# Patient Record
Sex: Male | Born: 1935
Health system: Southern US, Community
[De-identification: ages and names within clinical notes are randomized; demographics above are authoritative.]

## PROBLEM LIST (undated history)

## (undated) ENCOUNTER — Emergency Department: Admission: EM | Payer: Medicare HMO

## (undated) DIAGNOSIS — I469 Cardiac arrest, cause unspecified: Secondary | ICD-10-CM

## (undated) DIAGNOSIS — C3412 Malignant neoplasm of upper lobe, left bronchus or lung: Secondary | ICD-10-CM

## (undated) DIAGNOSIS — Z9861 Coronary angioplasty status: Secondary | ICD-10-CM

## (undated) DIAGNOSIS — I4901 Ventricular fibrillation: Secondary | ICD-10-CM

## (undated) DIAGNOSIS — H9190 Unspecified hearing loss, unspecified ear: Secondary | ICD-10-CM

## (undated) DIAGNOSIS — I10 Essential (primary) hypertension: Secondary | ICD-10-CM

## (undated) DIAGNOSIS — E785 Hyperlipidemia, unspecified: Secondary | ICD-10-CM

## (undated) DIAGNOSIS — I213 ST elevation (STEMI) myocardial infarction of unspecified site: Secondary | ICD-10-CM

## (undated) DIAGNOSIS — I2510 Atherosclerotic heart disease of native coronary artery without angina pectoris: Secondary | ICD-10-CM

## (undated) DIAGNOSIS — M1990 Unspecified osteoarthritis, unspecified site: Secondary | ICD-10-CM

## (undated) HISTORY — DX: Hyperlipidemia, unspecified: E78.5

## (undated) HISTORY — DX: Cardiac arrest, cause unspecified: I46.9

## (undated) HISTORY — DX: ST elevation (STEMI) myocardial infarction of unspecified site: I21.3

## (undated) HISTORY — DX: Coronary angioplasty status: Z98.61

## (undated) HISTORY — DX: Ventricular fibrillation: I49.01

## (undated) HISTORY — DX: Essential (primary) hypertension: I10

## (undated) HISTORY — DX: Atherosclerotic heart disease of native coronary artery without angina pectoris: I25.10

## (undated) HISTORY — PX: CORONARY ANGIOPLASTY: SHX604

## (undated) SURGERY — ELECTROMAGNETIC NAVIGATION BRONCHOSCOPY
Anesthesia: General

---

## 2004-05-13 ENCOUNTER — Other Ambulatory Visit: Payer: Self-pay

## 2009-01-24 ENCOUNTER — Ambulatory Visit: Payer: Self-pay | Admitting: Family Medicine

## 2011-08-16 DIAGNOSIS — I219 Acute myocardial infarction, unspecified: Secondary | ICD-10-CM | POA: Insufficient documentation

## 2011-08-16 DIAGNOSIS — I469 Cardiac arrest, cause unspecified: Secondary | ICD-10-CM

## 2011-08-16 HISTORY — DX: Cardiac arrest, cause unspecified: I46.9

## 2011-08-26 DIAGNOSIS — I213 ST elevation (STEMI) myocardial infarction of unspecified site: Secondary | ICD-10-CM

## 2011-08-26 HISTORY — DX: ST elevation (STEMI) myocardial infarction of unspecified site: I21.3

## 2011-08-27 ENCOUNTER — Inpatient Hospital Stay: Payer: Self-pay | Admitting: Internal Medicine

## 2011-08-27 DIAGNOSIS — I2510 Atherosclerotic heart disease of native coronary artery without angina pectoris: Secondary | ICD-10-CM | POA: Insufficient documentation

## 2011-08-27 HISTORY — DX: Atherosclerotic heart disease of native coronary artery without angina pectoris: I25.10

## 2011-09-14 ENCOUNTER — Ambulatory Visit: Payer: Self-pay | Admitting: Internal Medicine

## 2011-10-16 DIAGNOSIS — Z9861 Coronary angioplasty status: Secondary | ICD-10-CM

## 2011-10-16 HISTORY — PX: CARDIAC CATHETERIZATION: SHX172

## 2011-10-16 HISTORY — DX: Coronary angioplasty status: Z98.61

## 2011-10-31 ENCOUNTER — Encounter: Payer: Self-pay | Admitting: Internal Medicine

## 2011-11-16 ENCOUNTER — Encounter: Payer: Self-pay | Admitting: Internal Medicine

## 2012-04-10 ENCOUNTER — Observation Stay: Payer: Self-pay | Admitting: Student

## 2012-04-10 LAB — COMPREHENSIVE METABOLIC PANEL
Albumin: 3.8 g/dL (ref 3.4–5.0)
Alkaline Phosphatase: 111 U/L (ref 50–136)
Anion Gap: 8 (ref 7–16)
BUN: 12 mg/dL (ref 7–18)
Bilirubin,Total: 0.8 mg/dL (ref 0.2–1.0)
Calcium, Total: 8.3 mg/dL — ABNORMAL LOW (ref 8.5–10.1)
Chloride: 103 mmol/L (ref 98–107)
Co2: 28 mmol/L (ref 21–32)
EGFR (African American): >60
EGFR (Non-African Amer.): >60
Glucose: 158 mg/dL — ABNORMAL HIGH (ref 65–99)
Osmolality: 281 (ref 275–301)
Sodium: 139 mmol/L (ref 136–145)
Total Protein: 7.2 g/dL (ref 6.4–8.2)

## 2012-04-10 LAB — URINALYSIS, COMPLETE
Blood: NEGATIVE
Ketone: NEGATIVE
Protein: NEGATIVE
RBC,UR: NONE SEEN /HPF (ref 0–5)
Specific Gravity: 1.013 (ref 1.003–1.030)
Squamous Epithelial: NONE SEEN

## 2012-04-10 LAB — CK TOTAL AND CKMB (NOT AT ARMC)
CK, Total: 46 U/L (ref 35–232)
CK-MB: <0.5 ng/mL — ABNORMAL LOW (ref 0.5–3.6)
CK-MB: <0.5 ng/mL — ABNORMAL LOW (ref 0.5–3.6)

## 2012-04-10 LAB — TROPONIN I: Troponin-I: <0.02 ng/mL

## 2012-04-10 LAB — CBC
HCT: 45.7 % (ref 40.0–52.0)
MCV: 92 fL (ref 80–100)
Platelet: 170 10*3/uL (ref 150–440)

## 2012-05-06 ENCOUNTER — Encounter: Payer: Self-pay | Admitting: Cardiovascular Disease

## 2012-05-15 ENCOUNTER — Ambulatory Visit (INDEPENDENT_AMBULATORY_CARE_PROVIDER_SITE_OTHER): Payer: Medicare HMO | Admitting: Cardiovascular Disease

## 2012-05-15 ENCOUNTER — Encounter: Payer: Self-pay | Admitting: Cardiovascular Disease

## 2012-05-15 VITALS — BP 138/82 | HR 70 | Ht 70.0 in | Wt 141.0 lb

## 2012-05-15 DIAGNOSIS — I2510 Atherosclerotic heart disease of native coronary artery without angina pectoris: Secondary | ICD-10-CM

## 2012-05-15 DIAGNOSIS — E785 Hyperlipidemia, unspecified: Secondary | ICD-10-CM | POA: Insufficient documentation

## 2012-05-15 DIAGNOSIS — I10 Essential (primary) hypertension: Secondary | ICD-10-CM | POA: Insufficient documentation

## 2012-05-15 NOTE — Progress Notes (Signed)
HPI  Scott Gross is a pleasant 78 year old gentleman who was referred by Dr. Juliann Pares for a second opinion regarding management of his LAD disease. He presented in November of 2012 with myocardial infarction and ventricular fibrillation arrest. He underwent emergent cardiac catheterization with drug-eluting stent placement to the mid LAD. He was noted at that time to have borderline significant proximal LAD and distal LAD disease which were not the culprit for myocardial infarction. He was treated medically. He underwent a nuclear stress test after which showed no evidence of ischemia at a good workload. He presented in June of this year with hypertensive urgency. Cardiac enzymes were negative. He had chest pain and dyspnea at that time and thus he underwent cardiac catheterization which showed patent stent with no significant change in his LAD disease. The patient currently reports no chest pain or dyspnea. He exercises regularly at the [facility]. He does treadmill for about one hour at a speed of 3-3.5 miles per hour. He does complain of feeling tired in his legs at rest but his symptoms actually improved with physical activities. He denies any palpitations. He does complain of some fatigue and discomfort in both legs and as mentioned above the symptoms are at rest and not with activities. He is a previous smoker and quit last year.  No Known Allergies   Current Outpatient Prescriptions on File Prior to Visit  Medication Sig Dispense Refill  . aspirin 81 MG tablet Take 81 mg by mouth daily.      Marland Kitchen atorvastatin (LIPITOR) 40 MG tablet Take 20 mg by mouth daily.       . clopidogrel (PLAVIX) 75 MG tablet Take 75 mg by mouth daily.      Marland Kitchen glipiZIDE (GLUCOTROL XL) 2.5 MG 24 hr tablet Take 2.5 mg by mouth daily.      Marland Kitchen lisinopril (PRINIVIL,ZESTRIL) 10 MG tablet Take 10 mg by mouth daily.      . metoprolol tartrate (LOPRESSOR) 12.5 mg TABS Take 12.5 mg by mouth daily.          Past Medical History    Diagnosis Date  . Hypertension   . Diabetes mellitus   . Post PTCA 2013    x2  . Cardiac arrest - ventricular fibrillation   . ST elevation MI (STEMI)   . Cardiac arrest Nov. 2012    x 2   . CAD (coronary artery disease) Nov. 12, 2012    Inferior ST elevation MI in November of 2012 with ventricular fibrillation arrest. Status post drug-eluting stent placement to the mid LAD. Most recent cardiac catheterization in June of this year showed patent stent with minimal restenosis, 75% proximal LAD and 80% distal LAD which were unchanged from before. Mild disease in the left circumflex and moderate RCA disease. Ejection fraction was 55%.     Past Surgical History  Procedure Date  . Cardiac catheterization 2013    @ [facility]; [facility] s/p stent      Family History  Problem Relation Age of Onset  . Heart attack Mother   . Heart attack Father      History   Social History  . Marital Status: Married    Spouse Name: N/A    Number of Children: N/A  . Years of Education: N/A   Occupational History  . Not on file.   Social History Main Topics  . Smoking status: Former Games developer  . Smokeless tobacco: Not on file  . Alcohol Use: No  . Drug Use:  No  . Sexually Active:    Other Topics Concern  . Not on file   Social History Narrative  . No narrative on file     ROS Constitutional: Negative for fever, chills, diaphoresis, activity change, appetite change and fatigue.  HENT: Negative for hearing loss, nosebleeds, congestion, sore throat, facial swelling, drooling, trouble swallowing, neck pain, voice change, sinus pressure and tinnitus.  Eyes: Negative for photophobia, pain, discharge and visual disturbance.  Respiratory: Negative for apnea, cough, chest tightness, shortness of breath and wheezing.  Cardiovascular: Negative for chest pain, palpitations and leg swelling.  Gastrointestinal: Negative for nausea, vomiting, abdominal pain, diarrhea, constipation, blood in stool and  abdominal distention.  Genitourinary: Negative for dysuria, urgency, frequency, hematuria and decreased urine volume.  Musculoskeletal: Negative for myalgias, back pain, joint swelling, arthralgias and gait problem.  Skin: Negative for color change, pallor, rash and wound.  Neurological: Negative for dizziness, tremors, seizures, syncope, speech difficulty, weakness, light-headedness, numbness and headaches.  Psychiatric/Behavioral: Negative for suicidal ideas, hallucinations, behavioral problems and agitation. The patient is not nervous/anxious.     PHYSICAL EXAM   BP 138/82  Pulse 70  Ht 5\' 10"  (1.778 m)  Wt 141 lb (63.957 kg)  BMI 20.23 kg/m2 Constitutional: He is oriented to person, place, and time. He appears well-developed and well-nourished. No distress.  HENT: No nasal discharge.  Head: Normocephalic and atraumatic.  Eyes: Pupils are equal and round. Right eye exhibits no discharge. Left eye exhibits no discharge.  Neck: Normal range of motion. Neck supple. No JVD present. No thyromegaly present.  Cardiovascular: Normal rate, regular rhythm, normal heart sounds and. Exam reveals no gallop and no friction rub. No murmur heard.  Pulmonary/Chest: Effort normal and breath sounds normal. No stridor. No respiratory distress. He has no wheezes. He has no rales. He exhibits no tenderness.  Abdominal: Soft. Bowel sounds are normal. He exhibits no distension. There is no tenderness. There is no rebound and no guarding.  Musculoskeletal: Normal range of motion. He exhibits no edema and no tenderness.  Neurological: He is alert and oriented to person, place, and time. Coordination normal.  Skin: Skin is warm and dry. No rash noted. He is not diaphoretic. No erythema. No pallor.  Psychiatric: He has a normal mood and affect. His behavior is normal. Judgment and thought content normal.  Vascular: His pulses are normal including femoral and distal.     ZOX:[id number]  Rhythm  -Right axis  -consider right ventricular hypertrophy.   -Right atrial enlargement.   Rightward P/QRS axis and rotation -possible pulmonary disease.    ASSESSMENT AND PLAN

## 2012-05-15 NOTE — Patient Instructions (Addendum)
Continue same medications.  I will review your angiogram and let you know further plans.

## 2012-05-15 NOTE — Assessment & Plan Note (Signed)
The patient is on atorvastatin 40 mg once daily. It's possible that some of his lower extremity discomfort might be due to this. There is no evidence of peripheral arterial disease on physical exam. His symptoms are also not consistent with claudication.

## 2012-05-15 NOTE — Assessment & Plan Note (Signed)
His blood pressure is well controlled at this time on lisinopril and metoprolol.

## 2012-05-19 NOTE — Assessment & Plan Note (Signed)
I reviewed the patient's cardiac catheterization with Dr. Juliann Pares. Certainly,  there is a 70-80% proximal LAD stenosis. The stent in the mid LAD was patent. Surprisingly, the patient reports no significant exertional chest pain or dyspnea even with moderate level exercise. He does complain of occasional chest pain at rest which does not seem to be cardiac in nature. He also complains of being fatigued. I discussed management options with the patient including continued medical therapy versus proceeding with cardiac catheterization and pressure wire interrogation of the left anterior descending artery. Given  that the patient is minimally symptomatic at this time, I think it's reasonable to continue medical therapy and monitor his symptoms. The mean concerning issue here is the location of the stenosis which is in the proximal LAD. The patient is going to discuss this with his wife and will let me know whether he wants to proceed with this.

## 2013-11-28 ENCOUNTER — Observation Stay: Payer: Self-pay | Admitting: Internal Medicine

## 2013-11-28 LAB — URINALYSIS, COMPLETE
Bacteria: NONE SEEN
Bilirubin,UR: NEGATIVE
Blood: NEGATIVE
Ketone: NEGATIVE
Leukocyte Esterase: NEGATIVE
Nitrite: NEGATIVE
PH: 7 (ref 4.5–8.0)
Protein: NEGATIVE
RBC,UR: 1 /HPF (ref 0–5)
SPECIFIC GRAVITY: 1.01 (ref 1.003–1.030)
Squamous Epithelial: NONE SEEN
WBC UR: 1 /HPF (ref 0–5)

## 2013-11-28 LAB — COMPREHENSIVE METABOLIC PANEL WITH GFR
Albumin: 3.9 g/dL
Alkaline Phosphatase: 99 U/L
Anion Gap: 3 — ABNORMAL LOW
BUN: 9 mg/dL
Bilirubin,Total: 0.6 mg/dL
Calcium, Total: 8.7 mg/dL
Chloride: 105 mmol/L
Co2: 29 mmol/L
Creatinine: 1.07 mg/dL
EGFR (African American): >60
EGFR (Non-African Amer.): >60
Glucose: 153 mg/dL — ABNORMAL HIGH
Osmolality: 276
Potassium: 3.6 mmol/L
SGOT(AST): 33 U/L
SGPT (ALT): 36 U/L
Sodium: 137 mmol/L
Total Protein: 7.4 g/dL

## 2013-11-28 LAB — LIPID PANEL
Cholesterol: 92 mg/dL
HDL Cholesterol: 27 mg/dL — ABNORMAL LOW
Ldl Cholesterol, Calc: 41 mg/dL
Triglycerides: 121 mg/dL
VLDL Cholesterol, Calc: 24 mg/dL

## 2013-11-28 LAB — CBC WITH DIFFERENTIAL/PLATELET
Basophil #: 0.1 10*3/uL (ref 0.0–0.1)
Basophil %: 0.9 %
EOS ABS: 0.3 10*3/uL (ref 0.0–0.7)
EOS PCT: 2.8 %
HCT: 51.4 % (ref 40.0–52.0)
HGB: 17 g/dL (ref 13.0–18.0)
LYMPHS PCT: 39.3 %
Lymphocyte #: 4.7 10*3/uL — ABNORMAL HIGH (ref 1.0–3.6)
MCH: 30.5 pg (ref 26.0–34.0)
MCHC: 33.2 g/dL (ref 32.0–36.0)
MCV: 92 fL (ref 80–100)
MONO ABS: 0.8 x10 3/mm (ref 0.2–1.0)
MONOS PCT: 6.4 %
Neutrophil #: 6 10*3/uL (ref 1.4–6.5)
Neutrophil %: 50.6 %
Platelet: 167 10*3/uL (ref 150–440)
RBC: 5.58 10*6/uL (ref 4.40–5.90)
RDW: 13.1 % (ref 11.5–14.5)
WBC: 12 10*3/uL — ABNORMAL HIGH (ref 3.8–10.6)

## 2013-11-28 LAB — TROPONIN I
Troponin-I: <0.02 ng/mL
Troponin-I: <0.02 ng/mL
Troponin-I: <0.02 ng/mL

## 2013-11-28 LAB — CK TOTAL AND CKMB (NOT AT ARMC)
CK, TOTAL: 72 U/L
CK, Total: 55 U/L
CK-MB: 0.9 ng/mL (ref 0.5–3.6)
CK-MB: 0.7 ng/mL (ref 0.5–3.6)

## 2013-11-28 LAB — PROTIME-INR
INR: 1
PROTHROMBIN TIME: 12.8 s (ref 11.5–14.7)

## 2013-11-28 LAB — HEMOGLOBIN A1C: Hemoglobin A1C: 6.2 % (ref 4.2–6.3)

## 2013-11-28 LAB — CK-MB: CK-MB: 0.8 ng/mL (ref 0.5–3.6)

## 2014-10-19 DIAGNOSIS — E119 Type 2 diabetes mellitus without complications: Secondary | ICD-10-CM | POA: Diagnosis not present

## 2014-10-19 DIAGNOSIS — E785 Hyperlipidemia, unspecified: Secondary | ICD-10-CM | POA: Diagnosis not present

## 2014-10-19 DIAGNOSIS — Z125 Encounter for screening for malignant neoplasm of prostate: Secondary | ICD-10-CM | POA: Diagnosis not present

## 2014-10-19 DIAGNOSIS — I10 Essential (primary) hypertension: Secondary | ICD-10-CM | POA: Diagnosis not present

## 2014-12-30 DIAGNOSIS — I209 Angina pectoris, unspecified: Secondary | ICD-10-CM | POA: Diagnosis not present

## 2014-12-30 DIAGNOSIS — E785 Hyperlipidemia, unspecified: Secondary | ICD-10-CM | POA: Diagnosis not present

## 2014-12-30 DIAGNOSIS — I10 Essential (primary) hypertension: Secondary | ICD-10-CM | POA: Diagnosis not present

## 2014-12-30 DIAGNOSIS — I2510 Atherosclerotic heart disease of native coronary artery without angina pectoris: Secondary | ICD-10-CM | POA: Diagnosis not present

## 2015-02-05 NOTE — Consult Note (Signed)
PATIENT NAME:  Scott Gross, Scott Gross MR#:  [id number] DATE OF BIRTH:  1934-07-26  DATE OF CONSULTATION:  11/28/2013  REFERRING PHYSICIAN:  Dr. Posey Pronto. CONSULTING PHYSICIAN:  Corey Skains, MD  REASON FOR CONSULTATION: Coronary artery disease, previous stenting, old myocardial infarction with chest pain and hypertension.   CHIEF COMPLAINT: "I had chest pain."   HISTORY OF PRESENT ILLNESS: This is a 81 year old male with previous coronary artery disease and acute cardiac arrest several years prior and had a stent placement and appropriate treatment. He is been on appropriate treatment for several years and done fairly well, with no evidence of chest pain, shortness of breath or current angina. The patient has had recent issues with chest discomfort, substernal in nature, not associated with shortness of breath, but similar to his previous chest pain prior to myocardial infarction. At that time, the patient had this for 5 minutes and has spontaneously left. He has EKG showing normal sinus rhythm with incomplete right bundle branch block and a troponin which is normal. Currently, he feels well on the current medications.  REVIEW OF SYSTEMS: The remainder of review of systems negative for vision change, ringing in the ears, hearing loss, cough, congestion, heartburn, nausea, vomiting, diarrhea, bloody stools, stomach pain, extremity pain, leg weakness, cramping of the buttocks, known blood clots, headaches, blackouts, dizzy spells, nosebleeds, congestion, trouble swallowing, frequent urination, urination at night, muscle weakness, numbness, anxiety, depression, skin lesions or skin rashes.   PAST MEDICAL HISTORY:  1. Hypertension.  2. Hyperlipidemia.  3. Valvular heart disease.  4. Coronary artery and a myocardial infarction.   FAMILY HISTORY: Multiple family history of cardiovascular disease.   SOCIAL HISTORY: Remote tobacco use. Currently, denies alcohol or tobacco use.   ALLERGIES: As listed.    MEDICATIONS: As listed.   PHYSICAL EXAMINATION:  VITAL SIGNS: Blood pressure is 122/68 bilaterally, heart rate 72 upright, reclining, and regular.  GENERAL: He is a well appearing male in no acute distress.  HEAD, EYES, EARS, NOSE AND THROAT: No icterus, thyromegaly, ulcers, hemorrhage or xanthelasma.  CARDIOVASCULAR: Regular rate and rhythm. Normal S1 and S2, without murmur, gallop or rub. PMI is normal size and placement. Carotid upstroke normal, without bruit. Jugular venous pressure is normal.  LUNGS: Clear to auscultation with normal respirations.  ABDOMEN: Soft, nontender, without hepatosplenomegaly or masses. Abdominal aorta is normal size without bruit.  EXTREMITIES: Show 2+ bilateral pulses in dorsal, pedal, radial and femoral arteries, without lower extremity edema, cyanosis, clubbing or ulcers.  NEUROLOGIC: He is oriented to time, place and person, with normal mood and affect.   ASSESSMENT: A 82 year old male with hypertension, hyperlipidemia, coronary artery disease, previous myocardial infarction, with unstable angina and no current evidence of myocardial infarction.   RECOMMENDATIONS:  1. Continue serial ECG and enzymes to assess for myocardial infarction.  2. Continue medication management for previous myocardial infarction, hypertension, hyperlipidemia, without change today.  3. Nitrates for any chest pain.  4. Ambulate and follow for improvements of symptoms and possible discharge to home if no further ambulation symptoms, with close followup on Monday for other evaluation and treatment options, although would consider cardiac catheterization if the patient has recent and further episodes of chest discomfort.   ____________________________ Corey Skains, MD bjk:lb D: 11/28/2013 12:09:15 ET T: 11/28/2013 12:33:57 ET JOB#: [id number]  cc: Corey Skains, MD, <Dictator> Corey Skains MD ELECTRONICALLY SIGNED 11/29/2013 17:12

## 2015-02-05 NOTE — H&P (Signed)
PATIENT NAME:  Scott Gross, Scott Gross MR#:  [id number] DATE OF BIRTH:  04-07-1934  DATE OF ADMISSION:  11/28/2013  PRIMARY CARE PHYSICIAN:  Dr. Kary Kos.  PRIMARY CARDIOLOGIST:  Dr. Clayborn Bigness.  CHIEF COMPLAINT:  Chest pain.   HISTORY OF PRESENT ILLNESS:  Scott Gross is a 81 year old pleasant male with past medical history significant for a history of coronary artery disease status post ST segment elevation MI in 2012 which resulted in cardiac arrest and he had an LAD stent placed at that time.  Repeat cardiac catheterization in 2013 June revealing a patent stent with proximal and distal LAD 75% stenosis.  He presents from home secondary to chest pain that woke him up around 4:30 a.m. this morning.  The patient said he just saw Dr. Clayborn Bigness in the office about three weeks ago and has been doing fine and had a follow-up appointment next year and no recent stress test required per Dr. Clayborn Bigness.  The patient went to bed fine, woke up around 4:30 a.m., had a heavy pain in the chest, lasted for about less than 30 minutes.  He took two aspirin.  He did not have any nitroglycerin at home.  Denies any nausea, vomiting, diaphoresis or radiation of the pain.  He was pain-free by the time he came to the ER and has not had further recurrence of the pain.  His EKG is normal sinus rhythm with no acute ST-T wave changes.  His first set of troponin is negative and he is being admitted under observation for unstable angina.   PAST MEDICAL HISTORY: 1.  Coronary artery disease, status post mid LAD stent in 2012.  2.  History of cardiac arrest during his MI in 2012.  3.  Hypertension.  4.  Borderline diabetes mellitus.   PAST SURGICAL HISTORY:  None other than the cardiac catheterization procedures.   ALLERGIES:  Currently none.   CURRENT HOME MEDICATIONS:  1.  Plavix 75 mg by mouth daily.  2.  Aspirin 81 mg by mouth daily.  3.  Amlodipine 5 mg by mouth daily.  4.  Lipitor 20 mg by mouth daily.  5.  Lisinopril 10 mg by  mouth daily.  6.  Metoprolol 25 mg by mouth twice daily.   SOCIAL HISTORY:  Lives at home with his wife.  Used to smoke before, but has been quit for almost three years now.  No history of alcohol or drug use.   FAMILY HISTORY:  Both parents have heart disease, but in their later years.  Both parents lived up to 53s and 92s.   REVIEW OF SYSTEMS:  CONSTITUTIONAL:  No fever, fatigue or weakness.  EYES:  No blurred vision, double vision, inflammation or glaucoma.  EARS, NOSE, THROAT:  No tinnitus, ear pain, hearing loss, epistaxis or discharge.  RESPIRATORY:  No cough, wheeze, hemoptysis or COPD.  CARDIOVASCULAR:  No chest pain, orthopnea, edema, arrhythmia, palpitations, or syncope.  GASTROINTESTINAL:  No nausea, vomiting, diarrhea, abdominal pain, hematemesis, or melena.  GENITOURINARY:  No dysuria, hematuria, renal calculus, frequency or incontinence.  ENDOCRINE:  No polyuria, nocturia, thyroid problems, heat or cold intolerance.  HEMATOLOGY:  No anemia, easy bruising or bleeding.  SKIN:  No acne, rash or lesions.  MUSCULOSKELETAL:  No neck, back, shoulder pain, arthritis or gout. NEUROLOGIC:  No numbness, weakness, CVA, TIA or seizures.  PSYCHOLOGICAL:  No anxiety, insomnia or depression.   PHYSICAL EXAMINATION: VITAL SIGNS:  Temperature 98.1 degrees Fahrenheit, pulse 74, respirations 18, blood pressure 159/91, pulse ox 95%  on room air.  GENERAL:  Well-built, well-nourished male, sitting in bed, not in any acute distress.  HEENT:  Normocephalic, atraumatic.  Pupils equal, round, reacting to light.  Anicteric sclerae.  Extraocular movements intact.  Oropharynx clear without erythema, mass or exudates.  NECK:  Supple.  No thyromegaly, JVD or carotid bruits.  No lymphadenopathy.  LUNGS:  Moving air bilaterally.  No wheeze or crackles.  No use of accessory muscles for breathing.  CARDIOVASCULAR:  S1, S2, regular rate and rhythm.  No murmurs, rubs or gallops.  ABDOMEN:  Soft, nontender,  nondistended.  No hepatosplenomegaly.  Normal bowel sounds.  EXTREMITIES:  No pedal edema.  No clubbing or cyanosis, 2+ dorsalis pedis pulses palpable bilaterally.  SKIN:  No acne, rash or lesions.  MUSCULOSKELETAL:  No joint tenderness, swelling or erythema noted.  LYMPHATICS:  No cervical lymphadenopathy.  NEUROLOGIC:  Cranial nerves II through XII remain intact.  Motor strength is 5 bu 5 in all 4 extremities and sensation is intact.  PSYCHOLOGICAL:  The patient is awake, alert, oriented x 3.   LABORATORY DATA:  WBC 12.0, hemoglobin 17.3, hematocrit 51.4, platelet count 167.   Sodium 137, potassium 3.6, chloride 105, bicarb 29, BUN 9, creatinine 1.07, glucose 153 and calcium of 8.7.   ALT 36, AST 33, alk phos 90, total bili 0.6, albumin of 3.9.   INR 1.0.   Urinalysis negative for any infection.   HbA1c is 6.2.   LDL cholesterol 41, HDL 27, total cholesterol 92 and triglycerides of 121.    First set of troponin is less than 0.02.  CK 72, CK-MB 0.9.   Second set of CK-MB 0.8 and troponin is less than 0.02.   EKG is showing normal sinus rhythm, incomplete right bundle branch block.  No acute ST-T wave changes and EKG is similar to his old EKG.   ASSESSMENT AND PLAN:  This is a 81 year old male with history of heart disease with mid LAD stenting in the past and hypertension, comes with chest pain.   1.  Chest pain.  Could be unstable angina versus musculoskeletal pain, currently chest pain-free.  EKG with no changes.  Troponin 2 sets are negative.  We will admit under observation for the third set.  The patient interested in getting outpatient stress test.  Cardiology consult is pending, so if the third set of troponin is negative if he can have a close followup with cardiologist as an outpatient on Monday, we will ambulate the patient to check for any cardiac symptoms.  If negative, likely will discharge home and get a Myoview as an outpatient.  The patient and his wife are agreeable  with the plan.  2.  Coronary artery disease, status post left anterior descending stent.  Last cardiac catheterization in June 2013 showing patent stent which was in the mid LAD region, but proximal and distal lesions were 75%.  Continue cardiac medications.  3.  Hypertension, stable.  Continue his home medications.  4.  DVT prophylaxis on Lovenox.  5.  CODE STATUS:  FULL CODE.   Time spent on the admission is 50 minutes.     ____________________________ Gladstone Lighter, MD rk:ea D: 11/28/2013 14:56:22 ET T: 11/28/2013 16:34:21 ET JOB#: [id number]  cc: Gladstone Lighter, MD, <Dictator> Dwayne D. Clayborn Bigness, MD Irven Easterly. Kary Kos, MD Gladstone Lighter MD ELECTRONICALLY SIGNED 12/01/2013 15:02

## 2015-02-05 NOTE — Discharge Summary (Signed)
PATIENT NAME:  JETT, FUKUDA MR#:  [id number] DATE OF BIRTH:  1934/05/07  DATE OF ADMISSION:  11/28/2013 DATE OF DISCHARGE:  11/28/2013  ADMITTING PHYSICIAN: Gladstone Lighter, MD  DISCHARGING PHYSICIAN: Gladstone Lighter, MD   PRIMARY CARE PHYSICIAN: Jarome Lamas, MD   PRIMARY CARDIOLOGIST: Dwayne D. Clayborn Bigness, [place]: Cardiology consultation by Dr. Nehemiah Massed.   DISCHARGE DIAGNOSES: 1.  Musculoskeletal chest pain.  2.  Unstable angina.  3.  Coronary artery disease status post prior left anterior descending artery stent in 2012.  4.  Hypertension.   DISCHARGE HOME MEDICATIONS:  1.  Aspirin 81 mg p.o. daily.  3.  Plavix 75 mg p.o. daily.  3.  Lipitor 20 mg p.o. daily.  4.  Metoprolol 25 mg p.o. b.i.d.  5.  Lisinopril 10 mg p.o. daily.  6.  Norvasc 5 mg p.o. daily.   DISCHARGE DIET: Low-sodium diet.   DISCHARGE ACTIVITY: As tolerated.    FOLLOWUP INSTRUCTIONS:  Follow up with [facility] cardiology in 2 days.   LABORATORY, DIAGNOSTIC AND RADIOLOGICAL DATA PRIOR TO DISCHARGE:  1.  CK 55, CK-MB 0.7, last troponin is less than 0.0. Prior 2 sets of troponins were negative as well.  2.  Chest x-ray on admission showing clear lung fields, no acute cardiopulmonary disease.  3.  Laboratories: WBC 12.0, hemoglobin 13.3, hematocrit 51.4, platelet count 167.  4.  Sodium 137, potassium 3.6, chloride 105, bicarb 29, BUN 9, creatinine 1.0, glucose 153 and calcium of 8.7.  5.  Albumin 3.9, AST 36, ALT 33, alk phos 99, total bili zero of 0.6.  6.  INR.1.0.  7.  Urinalysis negative for any infection.   BRIEF HOSPITAL COURSE: Mr. Whitworth is a 81 year old pleasant male with past medical history significant for severe ST segment elevation MI with cardiac arrest in 2012 requiring a mid LAD stent at that time.  Has been doing fine up until the morning, when he woke up with chest pain and presented to the hospital.  1.  Chest pain, could be musculoskeletal pain versus unstable angina.  The patient was observed in the hospital. He has been chest pain-free since he has been here, has not required any  morphine or nitro here. His troponins, 3 sets were negative and since it was Saturday afternoon the patient was not keen on staying in the hospital until Monday to get the stress test. Seen by Dr. Nehemiah Massed. The patient ambulated, did not have any chest pain. Dr. Nehemiah Massed said that the patient can be discharged home and follow up with Dr. Clayborn Bigness in the office in 2 to 3 days. The patient is very educated and well aware and takes care of his health and he and his wife both agreed to the plan wanted to follow up as an outpatient. The patient was just seen by Dr. Clayborn Bigness as an outpatient 3 weeks prior and everything was fine. His last cardiac cath was in 2013, June, which showed patent mid LAD stent but there was proximal and distal LAD 75% stenosis. The patient is already on aspirin, Plavix, statin, lisinopril and metoprolol at home.  2.  His course has been otherwise uneventful in the hospital. All his home medications were continued without any changes.   DISCHARGE CONDITION: Stable.   DISCHARGE DISPOSITION: Home.   TIME SPENT ON DISCHARGE: 40 minutes.   ____________________________ Gladstone Lighter, MD rk:cs D: 11/29/2013 12:27:42 ET T: 11/29/2013 15:30:14 ET JOB#: [id number]  cc: Gladstone Lighter, MD, <Dictator> Dwayne D. Clayborn Bigness, MD Irven Easterly.  Kary Kos, MD Gladstone Lighter MD ELECTRONICALLY SIGNED 12/01/2013 15:02

## 2015-02-06 NOTE — Discharge Summary (Signed)
PATIENT NAME:  Scott Gross, Scott Gross MR#:  [id number] DATE OF BIRTH:  [birth year]  DATE OF ADMISSION:  04/10/2012 DATE OF DISCHARGE:  04/10/2012  CONSULTING PHYSICIAN: Dr. Clayborn Bigness PRIMARY CARE PHYSICIAN: Jarome Lamas   CHIEF COMPLAINT: Diaphoresis, nausea, dizziness.   DISCHARGE DIAGNOSES:  1. Hypertensive urgency. 2. Diabetes. 3. Coronary artery disease. Patient underwent a cardiac catheterization this admission, results being physical dictated below.   SECONDARY DIAGNOSIS: Coronary artery disease status post ST elevation myocardial infarction and ventricular fibrillation arrest in November status post drug-eluting stent to mid left anterior descending.   DISCHARGE MEDICATIONS:  1. Aspirin 81 mg daily.  2. Plavix 75 mg daily.  3. Glipizide 2.5 mg extended-release daily.  4. Lisinopril 10 mg daily.  5. Metoprolol tartrate 12.5 mg 2 times a day. 6. Lipitor 20 mg daily.   DIET: Low sodium, ADA diet.   ACTIVITY: As tolerated.   FOLLOW UP: Please follow up with your primary care physician in 1 to 2 weeks for blood pressure check. Please follow up with your cardiologist in 1 to 2 weeks.   DISPOSITION: Home.   CODE STATUS: Patient is FULL CODE.   HISTORY OF PRESENT ILLNESS: For full details of history and physical, please see the dictation on 04/10/2012 by Dr. Bridgette Habermann but briefly this is a 81 year old gentleman with coronary artery disease, status post myocardial infarction and ventricular fibrillation arrest in November who underwent successful cardiac catheterization and drug-eluting stent to mid LAD presents with diaphoresis, nausea, and dizziness and on arrival was noted to have systolic blood pressure in 199 range and admitted to the hospitalist service and initiated on heparin drip, nitro paste and cardiology consult.   LABORATORY, DIAGNOSTIC AND RADIOLOGICAL DATA: Initial glucose 166, sodium 139. LFTs within normal limits. Troponins were negative x2. WBC 14.4. INR 1. Urinalysis not  suggestive of infection. CT of the head done for dizziness showed involutional changes as well as changes likely reflecting microangiopathy. No evidence of acute abnormalities. X-ray chest portable, one view, showing no acute cardiopulmonary disease. Cardiac catheterization done by Dr. Clayborn Bigness on the same day although not fully dictated states 75% proximal LAD, mid stent 75% distal. No intervention done and medical management was recommended.   HOSPITAL COURSE: Patient was ruled out for acute coronary syndrome with cyclic cardiac markers and underwent catheterization noted above. Patient did not have any chest pains. Of note, patient did have some dizziness and diaphoresis and had a CT of the head which was not consistent with acute CVA. Patient was neurologically intact. Patient did have significant elevation of blood pressure and likely was the cause of his dizziness, i.e. being hypertensive and having hypertensive urgency. The blood pressure was brought down slowly and post catheterization he had stable blood pressure and was discharged without any chest pains. He did have some elevation of WBC, however, he had no fever and no evidence of pneumonia or upper respiratory infection symptoms. He had no urinary tract infection symptoms as well. He can follow up with this as an outpatient.   DISPOSITION: Home.   TOTAL TIME SPENT: 35 minutes.   CODE STATUS: Patient is FULL CODE.  ____________________________ Vivien Presto, MD sa:cms D: 04/11/2012 13:10:57 ET T: 04/11/2012 13:49:58 ET  JOB#: [id number] cc: Irven Easterly. Kary Kos, MD Vivien Presto MD ELECTRONICALLY SIGNED 04/19/2012 13:08

## 2015-02-06 NOTE — Consult Note (Signed)
PATIENT NAME:  Scott Gross, SHAFF MR#:  [id number] DATE OF BIRTH:  [birth year]  DATE OF CONSULTATION:  04/10/2012  REFERRING PHYSICIAN:  Dr. Kary Kos  CONSULTING PHYSICIAN:  Dwayne D. Callwood, MD  INDICATION: Unstable angina, shortness of breath, coronary artery disease.  HISTORY OF PRESENT ILLNESS: Scott Gross is a 81 year old white male history of coronary artery disease status post ST elevation myocardial infarction with ventricular fibrillation arrest in November, underwent successful STEMI procedure with drug-eluting stent to the LAD. He has done reasonably well. Patient woke up with significant diaphoresis. He felt weak and tired and pale. He had some nausea and dizziness but no vomiting. He sat down on the floor and called his wife. EMS came. Blood pressure was 956 systolic. Initial EKG did not show any acute changes. Troponins are negative. Symptoms lasted about 10 or 15 minutes. CT of the head was negative so he was eventually advised to be admitted and evaluated by cardiology and [facility].  REVIEW OF SYSTEMS: No blackouts or syncope. He has had some nausea. No vomiting. No fever. No chills. He has had some sweats. No weight loss. No weight gain. No hemoptysis, hematemesis. No bright red blood per rectum.   PAST MEDICAL HISTORY:  1. Coronary artery disease.  2. Unstable angina.  3. STEMI.  4. Hypertension.  5. Hyperlipidemia.   ALLERGIES: None.   MEDICATIONS:  1. Aspirin 325 a daily.  2. Glipizide 2.5 daily.  3. Lipitor 40 a day.  4. Lisinopril 10 a day.  5. Metoprolol 12.5 twice a day.  6. Plavix 75 a day.   FAMILY HISTORY: Okay except for myocardial infarction and coronary artery disease.   SOCIAL HISTORY: Previous smoker. No alcohol consumption.   PHYSICAL EXAMINATION:  VITAL SIGNS: Blood pressure 200/100, pulse 60, respiratory rate 18, afebrile.   HEENT: Normocephalic, atraumatic. Pupils equal, reactive to light.  NECK: Supple. No jugular venous distention, bruits,  adenopathy.   LUNGS: Clear to auscultation and percussion. No significant wheeze, rhonchi, rales.   HEART: Regular rate and rhythm. Positive bowel sounds. No rebound, guarding, or tenderness.   EXTREMITIES: Within normal limits. No cyanosis, clubbing, edema.   NEUROLOGICAL: Exam was intact.   SKIN: Normal.   LABORATORY, DIAGNOSTIC AND RADIOLOGICAL DATA: Creatinine 1.04, sodium 139, potassium 3.6, glucose 158, magnesium 1.9. LFTs normal. Troponin negative. White count 14.4, hemoglobin 15.4, hematocrit 45.7, platelet count 170. Urinalysis was negative. EKG: Normal sinus rhythm, nonspecific ST-T changes. CT of the head was negative.   ASSESSMENT:  1. Possible unstable angina.  2. Known coronary artery disease. 3. Hypertension. 4. Hyperlipidemia. 5. Past history of diabetes. 6. History of drug-eluting stent from a STEMI. 7. Reflux symptoms.   PLAN: Agree with admit. Will probably proceed with cardiac catheterization. He has known coronary disease from his previous catheterization that were treated medically because he had a STEMI. He has mod/severe disease in the LAD 75 proximal and 75 distal to the stent. Functional study post STEMI was normal but now with these potential cardiac symptoms further work-up will be necessary. Cardiac catheterization should be the first procedure and then will consider whether further workup or intervention is necessary. Continue statin therapy. Continue exercise. Continue blood pressure control. Base further evaluation on results of catheterization.   ____________________________ Loran Senters Clayborn Bigness, MD ddc:cms D: 04/11/2012 08:37:24 ET T: 04/11/2012 10:20:59 ET JOB#: [id number]  cc: Dwayne D. Clayborn Bigness, MD, <Dictator>  Yolonda Kida MD ELECTRONICALLY SIGNED 04/28/2012 6:38

## 2015-02-06 NOTE — H&P (Signed)
PATIENT NAME:  Scott Gross, Scott Gross MR#:  [id number] DATE OF BIRTH:  05-05-1934  DATE OF ADMISSION:  04/10/2012  REFERRING PHYSICIAN: Dr. Renard Hamper   PRIMARY CARE PHYSICIAN: Dr. [facility]   CARDIOLOGIST: Dr. Clayborn Bigness   CHIEF COMPLAINT: Diaphoresis, nausea, dizziness.   HISTORY OF PRESENT ILLNESS: The patient is a pleasant 81 year old Caucasian male with history of coronary artery disease status post ST elevation MI and V. fib arrest in November who underwent a successful cardiac cath and drug-eluting stents to mid LAD in November of 2012, hypertension, and diabetes who was at his baseline until this morning when he woke up and discovered he had nausea, diaphoresis, and dizziness. He sat down on the floor and the wife called EMS. On arrival he was noted to have blood pressures in 185 systolic range. Initial EKG did not show any acute ST changes. Troponins were negative. The patient stated that symptoms lasted for about 10 minutes. He has a CT of the head which is negative for acute event and hospitalist service was contacted for further evaluation and management. The patient denies having any fevers, chills.   PAST MEDICAL HISTORY:  1. Hypertension. 2. Diabetes. 3. Coronary artery disease status post ST elevation MI and V. fib arrest in November. 4. Status post PTCA x2 earlier this year.   PAST SURGICAL HISTORY: None besides the cath.  ALLERGIES: None.  MEDICATIONS: 5. The patient did take 325 mg aspirin x2 today.  6. Aspirin 81 mg daily.  7. Glipizide 2.5 mg daily.  8. Lipitor 40 mg daily.  9. Lisinopril 10 mg daily.  10. Metoprolol tartrate 12.5 mg two times a day.  11. Plavix 75 mg daily.   SOCIAL HISTORY: Previous smoker, quit late last year. No alcohol or drug use.   FAMILY HISTORY: Father and mother with MI.   REVIEW OF SYSTEMS: CONSTITUTIONAL: No fever, weight changes, fatigue. He has no blurry vision or double vision. ENT: Had some dizziness prior. No snoring,  postnasal drip, or tinnitus. RESPIRATORY: Denies cough, wheezing, hemoptysis, dyspnea, or COPD. CARDIOVASCULAR: No chest pain. No orthopnea or edema. History of high blood pressure. GI: No nausea, vomiting, abdominal pain, rectal bleeding. GU: Denies dysuria, hematuria, or incontinence. ENDOCRINE: Denies polyuria, nocturia or thyroid problems. HEME/LYMPH: Denies anemia or easy bruising. SKIN: Denies any rashes. MUSCULOSKELETAL: No arthritis. NEUROLOGIC: Denies numbness, weakness. PSYCHIATRIC: Denies anxiety or insomnia.   PHYSICAL EXAMINATION:   VITAL SIGNS: Temperature on arrival 97.6, pulse 62, respiratory rate 16, blood pressure 197/102, last blood pressure 142/91, oxygen sat 98% on room air on arrival.   GENERAL: The patient is a pleasant Caucasian male sitting in bed in no obvious distress talking in full sentences.   HEENT: Normocephalic, atraumatic. Pupils equal and reactive. Anicteric sclerae. Moist mucous membranes. Anicteric sclerae.   NECK: Supple. No thyroid tenderness. No lymphadenopathy in cervical region.   CARDIOVASCULAR: S1, S2. Normal rhythm, normal rate. No murmurs appreciated.   LUNGS: Clear to auscultation without wheezing, rhonchi, or rales.   ABDOMEN: Soft, nontender, nondistended. No organomegaly appreciated.   EXTREMITIES: No significant lower extremity edema.   NEUROLOGICAL: Cranial nerves II through XII grossly intact. Strength is 5/5 in all extremities.   SKIN: No obvious rashes.   PSYCH: Awake, alert, oriented x3, pleasant, cooperative.   LABORATORY, DIAGNOSTIC, AND RADIOLOGICAL DATA: Creatinine 1.04, sodium 139, potassium 3.6, glucose 158. Magnesium 1.9. LFTs within normal limits. Troponin negative x1. WBC 14.4, hemoglobin 15.4, hematocrit 45.7, platelets 170. INR 1. Urinalysis not suggestive of infection.  EKG normal sinus rhythm, rate 62, possible left atrial enlargement. No acute ST elevations or depressions. There are some Q waves in V1.  CT of the  head without contrast on arrival showing involutional changes as well as changes likely reflecting microangiopathy. No evidence of acute abnormality.   ASSESSMENT AND PLAN: We have a 81 year old Caucasian male with hypertension, diabetes, ST elevation MI and V. fib arrest in November status post successful drug-eluting stents to mid LAD who presents with diaphoresis, nausea, dizziness, and was found to have hypertensive urgency. The symptoms lasted for 10 minutes and currently they have resolved. The patient at this point will be admitted to the hospitalist service for observation. He is a high risk patient with multiple risk factors described above. He has no active chest pains currently and his pressures have come down. He has been started on heparin drip and cardiologist, Dr. Clayborn Bigness, has been consulted who is planning on a cath later. Will make the patient n.p.o. except for meds and resume his aspirin, Plavix, and continue the heparin drip for now and wait for further recommendations per Dr. Clayborn Bigness. Will also start nitro paste, morphine p.r.n. Check lipids and hemoglobin A1c.  In regards to his diabetes, we would place the patient on sliding scale insulin and hold his glipizide.   His pressure is better with current treatment and we would monitor the blood pressure.   He does have some leukocytosis, however, has no dysuria, GI complaints, or productive cough or anything to suggest that he is having bronchitis or pneumonia. Of note, the patient has had some leukocytosis in the past and his WBC has been elevated even last hospitalization and no source was found. We would monitor this and the patient would likely need further evaluation by hematologist as an outpatient.   CODE STATUS: FULL CODE.   TOTAL TIME SPENT: 55 minutes.   ____________________________ Vivien Presto, MD sa:drc D: 04/10/2012 10:35:02 ET T: 04/10/2012 10:54:20 ET JOB#: [id number]  cc: Vivien Presto, MD, <Dictator> Irven Easterly. Kary Kos, MD Dwayne D. Clayborn Bigness, MD Vivien Presto MD ELECTRONICALLY SIGNED 04/19/2012 13:08

## 2015-04-26 DIAGNOSIS — E785 Hyperlipidemia, unspecified: Secondary | ICD-10-CM | POA: Diagnosis not present

## 2015-04-26 DIAGNOSIS — I10 Essential (primary) hypertension: Secondary | ICD-10-CM | POA: Diagnosis not present

## 2015-04-26 DIAGNOSIS — Z125 Encounter for screening for malignant neoplasm of prostate: Secondary | ICD-10-CM | POA: Diagnosis not present

## 2015-04-26 DIAGNOSIS — E119 Type 2 diabetes mellitus without complications: Secondary | ICD-10-CM | POA: Diagnosis not present

## 2015-05-03 DIAGNOSIS — E785 Hyperlipidemia, unspecified: Secondary | ICD-10-CM | POA: Diagnosis not present

## 2015-05-03 DIAGNOSIS — E119 Type 2 diabetes mellitus without complications: Secondary | ICD-10-CM | POA: Diagnosis not present

## 2015-05-03 DIAGNOSIS — I10 Essential (primary) hypertension: Secondary | ICD-10-CM | POA: Diagnosis not present

## 2015-05-03 DIAGNOSIS — M79641 Pain in right hand: Secondary | ICD-10-CM | POA: Diagnosis not present

## 2015-06-28 DIAGNOSIS — I2510 Atherosclerotic heart disease of native coronary artery without angina pectoris: Secondary | ICD-10-CM | POA: Diagnosis not present

## 2015-06-28 DIAGNOSIS — I739 Peripheral vascular disease, unspecified: Secondary | ICD-10-CM | POA: Diagnosis not present

## 2015-06-28 DIAGNOSIS — I252 Old myocardial infarction: Secondary | ICD-10-CM | POA: Diagnosis not present

## 2015-06-28 DIAGNOSIS — I209 Angina pectoris, unspecified: Secondary | ICD-10-CM | POA: Diagnosis not present

## 2015-06-28 DIAGNOSIS — E784 Other hyperlipidemia: Secondary | ICD-10-CM | POA: Diagnosis not present

## 2015-06-28 DIAGNOSIS — R9431 Abnormal electrocardiogram [ECG] [EKG]: Secondary | ICD-10-CM | POA: Diagnosis not present

## 2015-06-28 DIAGNOSIS — E119 Type 2 diabetes mellitus without complications: Secondary | ICD-10-CM | POA: Diagnosis not present

## 2015-06-28 DIAGNOSIS — I10 Essential (primary) hypertension: Secondary | ICD-10-CM | POA: Diagnosis not present

## 2015-07-07 DIAGNOSIS — L820 Inflamed seborrheic keratosis: Secondary | ICD-10-CM | POA: Diagnosis not present

## 2015-11-13 ENCOUNTER — Emergency Department
Admission: EM | Admit: 2015-11-13 | Discharge: 2015-11-14 | Disposition: A | Discharge status: Home / Self Care | Payer: Commercial Managed Care - HMO | Attending: Emergency Medicine | Admitting: Emergency Medicine

## 2015-11-13 ENCOUNTER — Encounter: Payer: Self-pay | Admitting: *Deleted

## 2015-11-13 DIAGNOSIS — Z79899 Other long term (current) drug therapy: Secondary | ICD-10-CM | POA: Insufficient documentation

## 2015-11-13 DIAGNOSIS — I10 Essential (primary) hypertension: Secondary | ICD-10-CM

## 2015-11-13 DIAGNOSIS — Z7982 Long term (current) use of aspirin: Secondary | ICD-10-CM | POA: Insufficient documentation

## 2015-11-13 DIAGNOSIS — E119 Type 2 diabetes mellitus without complications: Secondary | ICD-10-CM | POA: Insufficient documentation

## 2015-11-13 DIAGNOSIS — Z87891 Personal history of nicotine dependence: Secondary | ICD-10-CM | POA: Insufficient documentation

## 2015-11-13 LAB — BASIC METABOLIC PANEL
ANION GAP: 6 (ref 5–15)
BUN: 10 mg/dL (ref 6–20)
CALCIUM: 9.1 mg/dL (ref 8.9–10.3)
CO2: 30 mmol/L (ref 22–32)
CREATININE: 0.97 mg/dL (ref 0.61–1.24)
Chloride: 102 mmol/L (ref 101–111)
GFR calc Af Amer: >60 mL/min (ref >60)
Glucose, Bld: 194 mg/dL — ABNORMAL HIGH (ref 65–99)
Potassium: 4 mmol/L (ref 3.5–5.1)
Sodium: 138 mmol/L (ref 135–145)

## 2015-11-13 LAB — URINALYSIS COMPLETE WITH MICROSCOPIC (ARMC ONLY)
Bacteria, UA: NONE SEEN
Bilirubin Urine: NEGATIVE
GLUCOSE, UA: 50 mg/dL — AB
HGB URINE DIPSTICK: NEGATIVE
KETONES UR: NEGATIVE mg/dL
Leukocytes, UA: NEGATIVE
NITRITE: NEGATIVE
Protein, ur: NEGATIVE mg/dL
SPECIFIC GRAVITY, URINE: 1.003 — AB (ref 1.005–1.030)
Squamous Epithelial / LPF: NONE SEEN
WBC UA: NONE SEEN WBC/hpf (ref 0–5)
pH: 6 (ref 5.0–8.0)

## 2015-11-13 LAB — CBC
HCT: 49.6 % (ref 40.0–52.0)
HEMOGLOBIN: 16.6 g/dL (ref 13.0–18.0)
MCH: 30.9 pg (ref 26.0–34.0)
MCHC: 33.6 g/dL (ref 32.0–36.0)
MCV: 91.9 fL (ref 80.0–100.0)
PLATELETS: 178 10*3/uL (ref 150–440)
RBC: 5.4 MIL/uL (ref 4.40–5.90)
RDW: 13.4 % (ref 11.5–14.5)
WBC: 11.5 10*3/uL — ABNORMAL HIGH (ref 3.8–10.6)

## 2015-11-13 LAB — GLUCOSE, CAPILLARY: GLUCOSE-CAPILLARY: 191 mg/dL — AB (ref 65–99)

## 2015-11-13 LAB — TROPONIN I: Troponin I: <0.03 ng/mL (ref <0.031)

## 2015-11-13 NOTE — ED Notes (Addendum)
Pt states he "felt funny" at 1600 today. Pt was prompted by this feeling to check his blood pressure and it was elevated. Pt presents w/ elevated BP. Pt has hx of MI w/ cardiac cath and stent placement x 1. Pt takes ASA and Plavix. Pt is in no acute distress at this time, A&Ox 4. No gross neurodeficits, negative stroke screening. Pt is [facility].

## 2015-11-13 NOTE — ED Notes (Signed)
Pt took prn amlodipine 5 mg around 1730-1800 today when he noticed around 9833 that systolic was 825+. At daughters house, she took BP at 1930 and it was 180/104.

## 2015-11-13 NOTE — ED Provider Notes (Signed)
[facility] Emergency Department Provider Note  ____________________________________________  Time seen: Approximately 10:28 PM  I have reviewed the triage vital signs and the nursing notes.   HISTORY  Chief Complaint Hypertension and Dizziness    HPI Scott Gross is a 82 y.o. male percents for the evaluation of a "swimmy feeling".  Patient reports that this afternoon he checked his blood pressure and it was high over 180, he then started to feel "swimmy" this lasted a couple of hours. Denies any facial droop, numbness, weakness, headache trouble using his hands, walking or other concerns. No chest pain or trouble breathing. No changes in vision     Past Medical History  Diagnosis Date  . Diabetes mellitus   . Post PTCA 2013    x2  . Cardiac arrest - ventricular fibrillation   . ST elevation MI (STEMI) ([place])   . Cardiac arrest [facility]) Nov. 2012    x 2   . CAD (coronary artery disease) Nov. 12, 2012    Inferior ST elevation MI in November of 2012 with ventricular fibrillation arrest. Status post drug-eluting stent placement to the mid LAD. Most recent cardiac catheterization in June of this year showed patent stent with minimal restenosis, 75% proximal LAD and 80% distal LAD which were unchanged from before. Mild disease in the left circumflex and moderate RCA disease. Ejection fraction was 55%.  Marland Kitchen Hyperlipidemia   . Hypertension     Patient Active Problem List   Diagnosis Date Noted  . Hyperlipidemia   . Hypertension   . CAD (coronary artery disease) 08/27/2011    Past Surgical History  Procedure Laterality Date  . Cardiac catheterization  2013    @ [facility]; [facility] s/p stent     Current Outpatient Rx  Name  Route  Sig  Dispense  Refill  . aspirin 81 MG tablet   Oral   Take 81 mg by mouth daily.         Marland Kitchen atorvastatin (LIPITOR) 40 MG tablet   Oral   Take 20 mg by mouth daily.          . clopidogrel (PLAVIX) 75 MG tablet   Oral   Take 75 mg by mouth daily.         Marland Kitchen lisinopril (PRINIVIL,ZESTRIL) 10 MG tablet   Oral   Take 20 mg by mouth 2 (two) times daily.          . metoprolol tartrate (LOPRESSOR) 12.5 mg TABS   Oral   Take 12.5 mg by mouth 2 (two) times daily.          Marland Kitchen glipiZIDE (GLUCOTROL XL) 2.5 MG 24 hr tablet   Oral   Take 2.5 mg by mouth daily.           Allergies Review of patient's allergies indicates no known allergies.  Family History  Problem Relation Age of Onset  . Heart attack Mother   . Heart attack Father     Social History Social History  Substance Use Topics  . Smoking status: Former Research scientist (life sciences)  . Smokeless tobacco: None  . Alcohol Use: No    Review of Systems Constitutional: No fever/chills Eyes: No visual changes. ENT: No sore throat. Cardiovascular: Denies chest pain. Respiratory: Denies shortness of breath. Gastrointestinal: No abdominal pain.  No nausea, no vomiting.  No diarrhea.  No constipation. Genitourinary: Negative for dysuria. Musculoskeletal: Negative for back pain. Skin: Negative for rash. Neurological: Negative for headaches, focal weakness or numbness.  10-point  ROS otherwise negative.  ____________________________________________   PHYSICAL EXAM:  VITAL SIGNS: ED Triage Vitals  Enc Vitals Group     BP 11/13/15 2026 210/102 mmHg     Pulse Rate 11/13/15 2026 71     Resp 11/13/15 2026 20     Temp 11/13/15 2026 97.9 F (36.6 C)     Temp Source 11/13/15 2026 Oral     SpO2 11/13/15 2026 97 %     Weight 11/13/15 2026 148 lb (67.132 kg)     Height 11/13/15 2026 '5\' 10"'$  (1.778 m)     Head Cir --      Peak Flow --      Pain Score 11/13/15 2025 0     Pain Loc --      Pain Edu? --      Excl. in [place]? --    Constitutional: Alert and oriented. Well appearing and in no acute distress. Eyes: Conjunctivae are normal. PERRL. EOMI. Head: Atraumatic. Nose: No congestion/rhinnorhea. Mouth/Throat: Mucous membranes are moist.  Oropharynx  non-erythematous. Neck: No stridor.   Cardiovascular: Normal rate, regular rhythm. Grossly normal heart sounds.  Good peripheral circulation. Respiratory: Normal respiratory effort.  No retractions. Lungs CTAB. Gastrointestinal: Soft and nontender. No distention. No abdominal bruits. No CVA tenderness. Musculoskeletal: No lower extremity tenderness nor edema.  No joint effusions. Neurologic:  Normal speech and language. No gross focal neurologic deficits are appreciated. No gait instability. Walks back and forth across the room without any trouble. At the time of my evaluation he reports that all of his symptoms have resolved.  NIH score equals 0, performed by me at bedside. The patient has no pronator drift. The patient has normal cranial nerve exam. Extraocular movements are normal. Visual fields are normal. Patient has 5 out of 5 strength in all extremities. There is no numbness or gross, acute sensory abnormality in the extremities bilaterally. No speech disturbance. No dysarthria. No aphasia. No ataxia. Normal finger nose finger bilat. Patient speaking in full and clear sentences.   Skin:  Skin is warm, dry and intact. No rash noted. Psychiatric: Mood and affect are normal. Speech and behavior are normal.  ____________________________________________   LABS (all labs ordered are listed, but only abnormal results are displayed)  Labs Reviewed  BASIC METABOLIC PANEL - Abnormal; Notable for the following:    Glucose, Bld 194 (*)    All other components within normal limits  CBC - Abnormal; Notable for the following:    WBC 11.5 (*)    All other components within normal limits  URINALYSIS COMPLETEWITH MICROSCOPIC ([facility] ONLY) - Abnormal; Notable for the following:    Color, Urine COLORLESS (*)    APPearance CLEAR (*)    Glucose, UA 50 (*)    Specific Gravity, Urine 1.003 (*)    All other components within normal limits  GLUCOSE, CAPILLARY - Abnormal; Notable for the  following:    Glucose-Capillary 191 (*)    All other components within normal limits  TROPONIN I  CBG MONITORING, ED   ____________________________________________  EKG   ____________________________________________  RADIOLOGY  Discussed with the patient and his family the risks and benefits of CT scan including to evaluate and exclude the possibility of a possible small stroke or small amount of bleeding. After careful discussion, all the patient's symptoms have resolved and he and his family are understanding of our discussion, with shared medical decision-making decision to not obtain CT of the head. I think this is very reasonable, his detailed neurologic  exam is completely normal and all his symptoms are resolved making the likelihood of an acute ischemic event or small intraparenchymal hemorrhage extremely unlikely. He does not have any headache, visual change, or ongoing symptomatology at this time. Patient will return to the emergency room right away should he develop any numbness, tingling, confusion, weakness, headache or other new concerns arise. ____________________________________________   PROCEDURES  Procedure(s) performed: None  Critical Care performed: No  ____________________________________________   INITIAL IMPRESSION / ASSESSMENT AND PLAN / ED COURSE  Pertinent labs & imaging results that were available during my care of the patient were reviewed by me and considered in my medical decision making (see chart for details).  Patient presents for evaluation of dizziness. This has resolved. He did have notable hypertension on arrival which has improved without intervention in the emergency room. All symptoms presently resolved with intact and normal cardiovascular and neurologic exam. EKG and troponin both reviewed and no evidence of ischemia. Patient examined carefully as he does have a history of cardio vascular arrest requiring resuscitation. At present nothing to  indicate acute ischemic event, or acute neurologic insult.  Patient will monitor blood pressures at home and follow-up closely with his doctor.  Return precautions and treatment recommendations and follow-up discussed with the patient who is agreeable with the plan.  ____________________________________________   FINAL CLINICAL IMPRESSION(S) / ED DIAGNOSES  Final diagnoses:  Essential hypertension      Delman Kitten, MD 11/14/15 0100

## 2015-11-16 DIAGNOSIS — I10 Essential (primary) hypertension: Secondary | ICD-10-CM | POA: Diagnosis not present

## 2015-11-16 DIAGNOSIS — Z125 Encounter for screening for malignant neoplasm of prostate: Secondary | ICD-10-CM | POA: Diagnosis not present

## 2015-11-16 DIAGNOSIS — E119 Type 2 diabetes mellitus without complications: Secondary | ICD-10-CM | POA: Diagnosis not present

## 2015-12-27 DIAGNOSIS — I10 Essential (primary) hypertension: Secondary | ICD-10-CM | POA: Diagnosis not present

## 2015-12-27 DIAGNOSIS — I2510 Atherosclerotic heart disease of native coronary artery without angina pectoris: Secondary | ICD-10-CM | POA: Diagnosis not present

## 2015-12-27 DIAGNOSIS — I252 Old myocardial infarction: Secondary | ICD-10-CM | POA: Diagnosis not present

## 2015-12-27 DIAGNOSIS — I209 Angina pectoris, unspecified: Secondary | ICD-10-CM | POA: Diagnosis not present

## 2015-12-27 DIAGNOSIS — E784 Other hyperlipidemia: Secondary | ICD-10-CM | POA: Diagnosis not present

## 2015-12-27 DIAGNOSIS — I739 Peripheral vascular disease, unspecified: Secondary | ICD-10-CM | POA: Diagnosis not present

## 2015-12-27 DIAGNOSIS — E785 Hyperlipidemia, unspecified: Secondary | ICD-10-CM | POA: Diagnosis not present

## 2015-12-27 DIAGNOSIS — R9431 Abnormal electrocardiogram [ECG] [EKG]: Secondary | ICD-10-CM | POA: Diagnosis not present

## 2015-12-27 DIAGNOSIS — E119 Type 2 diabetes mellitus without complications: Secondary | ICD-10-CM | POA: Diagnosis not present

## 2016-01-02 DIAGNOSIS — E785 Hyperlipidemia, unspecified: Secondary | ICD-10-CM | POA: Diagnosis not present

## 2016-01-02 DIAGNOSIS — I10 Essential (primary) hypertension: Secondary | ICD-10-CM | POA: Diagnosis not present

## 2016-01-02 DIAGNOSIS — E119 Type 2 diabetes mellitus without complications: Secondary | ICD-10-CM | POA: Diagnosis not present

## 2016-01-02 DIAGNOSIS — M25511 Pain in right shoulder: Secondary | ICD-10-CM | POA: Diagnosis not present

## 2016-01-02 DIAGNOSIS — M25512 Pain in left shoulder: Secondary | ICD-10-CM | POA: Diagnosis not present

## 2016-05-21 DIAGNOSIS — G8929 Other chronic pain: Secondary | ICD-10-CM | POA: Diagnosis not present

## 2016-05-21 DIAGNOSIS — S46819A Strain of other muscles, fascia and tendons at shoulder and upper arm level, unspecified arm, initial encounter: Secondary | ICD-10-CM | POA: Diagnosis not present

## 2016-05-21 DIAGNOSIS — M25511 Pain in right shoulder: Secondary | ICD-10-CM | POA: Diagnosis not present

## 2016-05-21 DIAGNOSIS — M25512 Pain in left shoulder: Secondary | ICD-10-CM | POA: Diagnosis not present

## 2016-05-21 DIAGNOSIS — M958 Other specified acquired deformities of musculoskeletal system: Secondary | ICD-10-CM | POA: Diagnosis not present

## 2016-06-25 DIAGNOSIS — E784 Other hyperlipidemia: Secondary | ICD-10-CM | POA: Diagnosis not present

## 2016-06-25 DIAGNOSIS — E119 Type 2 diabetes mellitus without complications: Secondary | ICD-10-CM | POA: Diagnosis not present

## 2016-06-25 DIAGNOSIS — I2510 Atherosclerotic heart disease of native coronary artery without angina pectoris: Secondary | ICD-10-CM | POA: Diagnosis not present

## 2016-06-25 DIAGNOSIS — I739 Peripheral vascular disease, unspecified: Secondary | ICD-10-CM | POA: Diagnosis not present

## 2016-06-25 DIAGNOSIS — R9431 Abnormal electrocardiogram [ECG] [EKG]: Secondary | ICD-10-CM | POA: Diagnosis not present

## 2016-06-25 DIAGNOSIS — E785 Hyperlipidemia, unspecified: Secondary | ICD-10-CM | POA: Diagnosis not present

## 2016-06-25 DIAGNOSIS — I10 Essential (primary) hypertension: Secondary | ICD-10-CM | POA: Diagnosis not present

## 2016-06-25 DIAGNOSIS — I252 Old myocardial infarction: Secondary | ICD-10-CM | POA: Diagnosis not present

## 2016-06-25 DIAGNOSIS — I209 Angina pectoris, unspecified: Secondary | ICD-10-CM | POA: Diagnosis not present

## 2016-06-27 DIAGNOSIS — E119 Type 2 diabetes mellitus without complications: Secondary | ICD-10-CM | POA: Diagnosis not present

## 2016-06-27 DIAGNOSIS — I10 Essential (primary) hypertension: Secondary | ICD-10-CM | POA: Diagnosis not present

## 2016-06-27 DIAGNOSIS — E785 Hyperlipidemia, unspecified: Secondary | ICD-10-CM | POA: Diagnosis not present

## 2016-07-04 DIAGNOSIS — E119 Type 2 diabetes mellitus without complications: Secondary | ICD-10-CM | POA: Diagnosis not present

## 2016-07-04 DIAGNOSIS — I10 Essential (primary) hypertension: Secondary | ICD-10-CM | POA: Diagnosis not present

## 2016-07-04 DIAGNOSIS — M2550 Pain in unspecified joint: Secondary | ICD-10-CM | POA: Diagnosis not present

## 2016-07-04 DIAGNOSIS — E785 Hyperlipidemia, unspecified: Secondary | ICD-10-CM | POA: Diagnosis not present

## 2016-09-03 ENCOUNTER — Emergency Department: Payer: Commercial Managed Care - HMO

## 2016-09-03 ENCOUNTER — Emergency Department
Admission: EM | Admit: 2016-09-03 | Discharge: 2016-09-03 | Disposition: A | Discharge status: Home / Self Care | Payer: Commercial Managed Care - HMO | Attending: Emergency Medicine | Admitting: Emergency Medicine

## 2016-09-03 DIAGNOSIS — Y92000 Kitchen of unspecified non-institutional (private) residence as  the place of occurrence of the external cause: Secondary | ICD-10-CM | POA: Insufficient documentation

## 2016-09-03 DIAGNOSIS — R42 Dizziness and giddiness: Secondary | ICD-10-CM | POA: Diagnosis not present

## 2016-09-03 DIAGNOSIS — Z87891 Personal history of nicotine dependence: Secondary | ICD-10-CM | POA: Insufficient documentation

## 2016-09-03 DIAGNOSIS — I10 Essential (primary) hypertension: Secondary | ICD-10-CM | POA: Diagnosis not present

## 2016-09-03 DIAGNOSIS — R911 Solitary pulmonary nodule: Secondary | ICD-10-CM | POA: Diagnosis not present

## 2016-09-03 DIAGNOSIS — S0993XA Unspecified injury of face, initial encounter: Secondary | ICD-10-CM | POA: Diagnosis present

## 2016-09-03 DIAGNOSIS — Y939 Activity, unspecified: Secondary | ICD-10-CM | POA: Insufficient documentation

## 2016-09-03 DIAGNOSIS — W19XXXA Unspecified fall, initial encounter: Secondary | ICD-10-CM

## 2016-09-03 DIAGNOSIS — W1839XA Other fall on same level, initial encounter: Secondary | ICD-10-CM | POA: Diagnosis not present

## 2016-09-03 DIAGNOSIS — S01511A Laceration without foreign body of lip, initial encounter: Secondary | ICD-10-CM | POA: Insufficient documentation

## 2016-09-03 DIAGNOSIS — R55 Syncope and collapse: Secondary | ICD-10-CM | POA: Diagnosis not present

## 2016-09-03 DIAGNOSIS — R0789 Other chest pain: Secondary | ICD-10-CM | POA: Diagnosis not present

## 2016-09-03 DIAGNOSIS — Z79899 Other long term (current) drug therapy: Secondary | ICD-10-CM | POA: Diagnosis not present

## 2016-09-03 DIAGNOSIS — Z7984 Long term (current) use of oral hypoglycemic drugs: Secondary | ICD-10-CM | POA: Diagnosis not present

## 2016-09-03 DIAGNOSIS — E119 Type 2 diabetes mellitus without complications: Secondary | ICD-10-CM | POA: Diagnosis not present

## 2016-09-03 DIAGNOSIS — I2510 Atherosclerotic heart disease of native coronary artery without angina pectoris: Secondary | ICD-10-CM | POA: Diagnosis not present

## 2016-09-03 DIAGNOSIS — J439 Emphysema, unspecified: Secondary | ICD-10-CM | POA: Diagnosis not present

## 2016-09-03 DIAGNOSIS — Y999 Unspecified external cause status: Secondary | ICD-10-CM | POA: Diagnosis not present

## 2016-09-03 DIAGNOSIS — Z7982 Long term (current) use of aspirin: Secondary | ICD-10-CM | POA: Diagnosis not present

## 2016-09-03 LAB — BASIC METABOLIC PANEL
Anion gap: 7 (ref 5–15)
BUN: 11 mg/dL (ref 6–20)
CO2: 29 mmol/L (ref 22–32)
CREATININE: 1 mg/dL (ref 0.61–1.24)
Calcium: 8.8 mg/dL — ABNORMAL LOW (ref 8.9–10.3)
Chloride: 101 mmol/L (ref 101–111)
GFR calc Af Amer: >60 mL/min (ref >60)
GLUCOSE: 179 mg/dL — AB (ref 65–99)
POTASSIUM: 3.2 mmol/L — AB (ref 3.5–5.1)
Sodium: 137 mmol/L (ref 135–145)

## 2016-09-03 LAB — TROPONIN I: Troponin I: <0.03 ng/mL (ref <0.03)

## 2016-09-03 LAB — CBC
HEMATOCRIT: 46.7 % (ref 40.0–52.0)
Hemoglobin: 16.1 g/dL (ref 13.0–18.0)
MCH: 30.8 pg (ref 26.0–34.0)
MCHC: 34.4 g/dL (ref 32.0–36.0)
MCV: 89.6 fL (ref 80.0–100.0)
Platelets: 167 10*3/uL (ref 150–440)
RBC: 5.22 MIL/uL (ref 4.40–5.90)
RDW: 13.2 % (ref 11.5–14.5)
WBC: 17.3 10*3/uL — ABNORMAL HIGH (ref 3.8–10.6)

## 2016-09-03 MED ORDER — ONDANSETRON HCL 4 MG/2ML IJ SOLN
INTRAMUSCULAR | Status: AC
  Filled 2016-09-03: qty 2

## 2016-09-03 MED ORDER — ASPIRIN 81 MG PO CHEW
243.0000 mg | CHEWABLE_TABLET | Freq: Once | ORAL | Status: DC
  Filled 2016-09-03: qty 3

## 2016-09-03 MED ORDER — ONDANSETRON HCL 4 MG/2ML IJ SOLN
4.0000 mg | Freq: Once | INTRAMUSCULAR | Status: AC
  Administered 2016-09-03: 4 mg via INTRAVENOUS

## 2016-09-03 MED ORDER — ASPIRIN 81 MG PO CHEW: 324.0000 mg | CHEWABLE_TABLET | Freq: Once | ORAL | Status: DC

## 2016-09-03 NOTE — ED Notes (Signed)
On daughter leaving; 3 other visitors at bedside; pt drinking diet Sprite; denies pain; no complaints or requests at this time

## 2016-09-03 NOTE — ED Notes (Signed)
Pt back from x-ray.

## 2016-09-03 NOTE — ED Triage Notes (Signed)
Pt states "wasn't feeling well", had pain under left axilla. Pt states he got up to go to kitchen, became dizzy and fell. Pt states when he had pain he became "real sweaty." pt denies pain currently. Pt with abrasion to mid lower forehead above left medial eyebrow and laceration to left lower mid lip.

## 2016-09-03 NOTE — ED Notes (Signed)
Repeat troponin drawn and sent to lab; waiting results and MD follow up with disposition

## 2016-09-03 NOTE — ED Notes (Signed)
Pt up to use toilet in room with steady gait; back to bed;

## 2016-09-03 NOTE — ED Notes (Signed)
In to administer Aspirin '243mg'$ ; pt and wife state pt actually took a full strength aspirin pta

## 2016-09-03 NOTE — ED Provider Notes (Signed)
[facility] Emergency Department Provider Note  ____________________________________________   First MD Initiated Contact with Patient 09/03/16 0240     (approximate)  I have reviewed the triage vital signs and the nursing notes.   HISTORY  Chief Complaint Fall; Dizziness; and Chest Pain    HPI Scott Gross is a 82 y.o. male with a history of MI approximately 5 years ago status post stents who presents for evaluation of acute onset left sided chest pain in the axilla.  He states that he has had a cough for several days which is mild to moderate.  Tonight he was not feeling well in general but without any specific symptoms.  He went to bed and then at some point woke up during the night and said he was very sweaty and felt generally ill.  He was having some sharp pain in his chest under the left axilla and got up to go to the bathroom when he became lightheaded and collapsed.  He struck his forehead and his lip on the ground but states that he did not lose consciousness, he was just too acutely weak to stand.  At this point he denies headache, neck pain, and has full nonpainful range of motion of his head and neck.  He does have a small abrasion to his forehead as well as a small laceration on his lower lip.  He states that he is not having chest pain anymore but he was worried because of his prior heart attack.  He still has an occasional mild cough.  He denies shortness of breath, fever/chills, abdominal pain, dysuria, nausea, vomiting.  The onset of his symptoms is acute, the severity was moderate to severe, and now he is essentially resolved.   Past Medical History:  Diagnosis Date  . CAD (coronary artery disease) Nov. 12, 2012   Inferior ST elevation MI in November of 2012 with ventricular fibrillation arrest. Status post drug-eluting stent placement to the mid LAD. Most recent cardiac catheterization in June of this year showed patent stent with minimal  restenosis, 75% proximal LAD and 80% distal LAD which were unchanged from before. Mild disease in the left circumflex and moderate RCA disease. Ejection fraction was 55%.  . Cardiac arrest [facility]) Nov. 2012   x 2   . Cardiac arrest - ventricular fibrillation   . Diabetes mellitus   . Hyperlipidemia   . Hypertension   . Post PTCA 2013   x2  . ST elevation MI (STEMI) [facility])     Patient Active Problem List   Diagnosis Date Noted  . Hyperlipidemia   . Hypertension   . CAD (coronary artery disease) 08/27/2011    Past Surgical History:  Procedure Laterality Date  . CARDIAC CATHETERIZATION  2013   @ [place]; [facility] s/p stent     Prior to Admission medications   Medication Sig Start Date End Date Taking? Authorizing Provider  aspirin 81 MG tablet Take 81 mg by mouth daily.    Historical Provider, MD  atorvastatin (LIPITOR) 40 MG tablet Take 20 mg by mouth daily.     Historical Provider, MD  clopidogrel (PLAVIX) 75 MG tablet Take 75 mg by mouth daily.    Historical Provider, MD  glipiZIDE (GLUCOTROL XL) 2.5 MG 24 hr tablet Take 2.5 mg by mouth daily.    Historical Provider, MD  lisinopril (PRINIVIL,ZESTRIL) 10 MG tablet Take 20 mg by mouth 2 (two) times daily.     Historical Provider, MD  metoprolol tartrate (  LOPRESSOR) 12.5 mg TABS Take 12.5 mg by mouth 2 (two) times daily.     Historical Provider, MD    Allergies Patient has no known allergies.  Family History  Problem Relation Age of Onset  . Heart attack Mother   . Heart attack Father     Social History Social History  Substance Use Topics  . Smoking status: Former Research scientist (life sciences)  . Smokeless tobacco: Not on file  . Alcohol use No    Review of Systems Constitutional: No fever/chills Eyes: No visual changes. ENT: No sore throat. Cardiovascular: Left axillary chest pain. Respiratory: Denies shortness of breath.  Recent cough for several days Gastrointestinal: No abdominal pain.  No nausea, no vomiting.  No diarrhea.  No  constipation. Genitourinary: Negative for dysuria. Musculoskeletal: Negative for back pain. Skin: Negative for rash. Neurological: Negative for headaches, focal weakness or numbness.  10-point ROS otherwise negative.  ____________________________________________   PHYSICAL EXAM:  VITAL SIGNS: ED Triage Vitals [09/03/16 0133]  Enc Vitals Group     BP (!) 180/99     Pulse Rate 88     Resp 16     Temp 98.2 F (36.8 C)     Temp Source Oral     SpO2 97 %     Weight 148 lb (67.1 kg)     Height '5\' 10"'$  (1.778 m)     Head Circumference      Peak Flow      Pain Score      Pain Loc      Pain Edu?      Excl. in [place]?     Constitutional: Alert and oriented. Well appearing and in no acute distress. Eyes: Conjunctivae are normal. PERRL. EOMI. Head: Abrasion to forehead, small laceration on lower lip. Nose: Mild congestion/rhinnorhea. Mouth/Throat: Mucous membranes are moist.  Oropharynx non-erythematous. Neck: No stridor.  No meningeal signs.   Cardiovascular: Normal rate, regular rhythm. Good peripheral circulation. Grossly normal heart sounds. Respiratory: Normal respiratory effort.  No retractions. Lungs CTAB. No reproducible chest wall tenderness Gastrointestinal: Soft and nontender. No distention.  Musculoskeletal: No lower extremity tenderness nor edema. No gross deformities of extremities. Neurologic:  Normal speech and language. No gross focal neurologic deficits are appreciated.  Skin:  Skin is warm, dry and intact. No rash noted. Psychiatric: Mood and affect are normal. Speech and behavior are normal.  ____________________________________________   LABS (all labs ordered are listed, but only abnormal results are displayed)  Labs Reviewed  BASIC METABOLIC PANEL - Abnormal; Notable for the following:       Result Value   Potassium 3.2 (*)    Glucose, Bld 179 (*)    Calcium 8.8 (*)    All other components within normal limits  CBC - Abnormal; Notable for the  following:    WBC 17.3 (*)    All other components within normal limits  TROPONIN I  TROPONIN I   ____________________________________________  EKG  ED ECG REPORT I, Caylan Chenard, the attending physician, personally viewed and interpreted this ECG.  Date: 09/03/2016 EKG Time: 01:38 Rate: 80 Rhythm: normal sinus rhythm with several premature atrial complexes QRS Axis: normal Intervals: RSR' in V1 ST/T Wave abnormalities: Non-specific ST segment / T-wave changes, but no evidence of acute ischemia. Conduction Disturbances: none Narrative Interpretation: unremarkable  ____________________________________________  RADIOLOGY   Dg Chest 2 View  Result Date: 09/03/2016 CLINICAL DATA:  Dizziness and fall. Axillary pain. History of coronary artery disease, cardiac arrest, diabetes, hypertension. Former smoker. EXAM:  CHEST  2 VIEW COMPARISON:  11/28/2013 FINDINGS: Normal heart size and pulmonary vascularity. Emphysematous changes in the lungs. No focal airspace disease or consolidation. No blunting of costophrenic angles. No pneumothorax. Mediastinal contours appear intact. Calcification of the aorta. Degenerative changes in the spine. IMPRESSION: Emphysematous changes in the lungs. No evidence of active pulmonary disease. Electronically Signed   By: Lucienne Capers M.D.   On: 09/03/2016 02:23   Ct Chest Wo Contrast  Result Date: 09/03/2016 CLINICAL DATA:  Cough and left-sided chest pain EXAM: CT CHEST WITHOUT CONTRAST TECHNIQUE: Multidetector CT imaging of the chest was performed following the standard protocol without IV contrast. COMPARISON:  Chest radiograph 09/03/2016 FINDINGS: Cardiovascular: Atherosclerotic calcification is present in the aortic arch. No aortic aneurysm. Pulmonary arteries are normal in size. The size of the heart is normal. No pericardial effusion. There are coronary artery calcifications. Mediastinum/Nodes: No mediastinal, hilar or axillary lymphadenopathy. The  visualized thyroid and thoracic esophageal course are unremarkable. Lungs/Pleura: There is a left upper lobe nodule measuring 1 cm with adjacent architectural distortion. There is bilateral emphysema. No pleural effusion or pneumothorax. No focal airspace consolidation. Upper Abdomen: The noncontrast appearance of the upper abdominal organs is unremarkable. Musculoskeletal: No chest wall mass or suspicious bone lesions identified. IMPRESSION: 1. 10 mm left upper lobe pulmonary nodule with surrounding architectural distortion is concerning for malignancy. 2. Emphysema. 3. No CT evidence of pneumonia. 4. Aortic and coronary artery atherosclerosis. Electronically Signed   By: Ulyses Jarred M.D.   On: 09/03/2016 03:50    ____________________________________________   PROCEDURES  Procedure(s) performed:   Procedures   Critical Care performed: No ____________________________________________   INITIAL IMPRESSION / ASSESSMENT AND PLAN / ED COURSE  Pertinent labs & imaging results that were available during my care of the patient were reviewed by me and considered in my medical decision making (see chart for details).   The patient is well appearing and in NAD.  HEART score technically is 6 based on prior MI and the acute onset of symptoms, but given recent infectious symptoms, leukocytosis, and pain very specifically located in the left axillary region, I believe pneumonia is more likely than ACS.  PE unlikely.  No clinical evidence of dehydration; patient is hypertensive, has moist mucous membranes, and a normal creatinine.  Encouraging PO fluids but no indication for IV fluids.  Given possibility of underlying pneumonia not visible on CXR, will obtain non-contrast chest CT.  Ordered 3-hour repeat troponin. Giving full dose ASA.   Clinical Course as of Sep 03 616  [facility] Sep 03, 2016  0401 No CT evidence of pneumonia, but 1-cm concerning nodule.  Will discuss with family. CT Chest Wo Contrast [CF]    0527 Second troponin negative.  Discussed CT findings with patient, spouse, and one (adult) daughter.  Explained concern for malignancy, but also explained diagnosis is not known.  Encouraged close outpatient follow up.  Answered questions for family.  They understand the plan.  No evidence of acute or emergent medical condition at this time.  [CF]    Clinical Course User Index [CF] Hinda Kehr, MD    ____________________________________________  FINAL CLINICAL IMPRESSION(S) / ED DIAGNOSES  Final diagnoses:  Atypical chest pain  Fall, initial encounter  Near syncope  Pulmonary nodule, left     MEDICATIONS GIVEN DURING THIS VISIT:  Medications  ondansetron (ZOFRAN) 4 MG/2ML injection (not administered)  ondansetron (ZOFRAN) injection 4 mg (4 mg Intravenous Given 09/03/16 0558)     NEW OUTPATIENT MEDICATIONS STARTED  DURING THIS VISIT:  New Prescriptions   No medications on file    Modified Medications   No medications on file    Discontinued Medications   No medications on file     Note:  This document was prepared using Dragon voice recognition software and may include unintentional dictation errors.    Hinda Kehr, MD 09/03/16 [phone]

## 2016-09-03 NOTE — ED Notes (Signed)
Pt received '81mg'$  of asa pta. fsbs per ems 223.

## 2016-09-03 NOTE — ED Notes (Signed)
Wife out to desk stating pt has become diaphoretic and nauseated; in to check on pt; says he became very hot and started sweating; denies pain; wife thinks pt just upset about the recent CT results; Dr Karma Greaser notified of same and verbal order given

## 2016-09-03 NOTE — ED Notes (Signed)
Patient transported to X-ray, daughter and wife now at bedside

## 2016-09-03 NOTE — Discharge Instructions (Signed)
As we discussed, your workup was reassuring today except for the possibly incidental finding of a 1 cm nodule in the left upper lobe of your lung. This does not constitute a diagnosis of cancer, but the radiologist felt it had a concerning appearance and it is important that you follow-up either with your primary care doctor or Dr. Grayland Ormond or one of his colleagues in the cancer center for further evaluation.  Return to the emergency department if you develop new or worsening symptoms that concern you.

## 2016-09-04 DIAGNOSIS — R911 Solitary pulmonary nodule: Secondary | ICD-10-CM | POA: Diagnosis not present

## 2016-09-04 DIAGNOSIS — I10 Essential (primary) hypertension: Secondary | ICD-10-CM | POA: Diagnosis not present

## 2016-09-04 DIAGNOSIS — J069 Acute upper respiratory infection, unspecified: Secondary | ICD-10-CM | POA: Diagnosis not present

## 2016-09-10 DIAGNOSIS — C3412 Malignant neoplasm of upper lobe, left bronchus or lung: Secondary | ICD-10-CM | POA: Insufficient documentation

## 2016-09-10 NOTE — Progress Notes (Signed)
[place]  [phone] Fax:([phone]) [phone]  ID: HAREL REPETTO OB: 1933-11-03  MR#: [id number]  ATF#:[id number]  Patient Care Team: [name], MD as PCP - General (Family Medicine)  CHIEF COMPLAINT:  Left upper lobe pulmonary nodule.  INTERVAL HISTORY: Patient is an 82 year old male who is having increased shortness of breath and underwent chest x-ray and CT scan which revealed left upper lobe pulmonary nodule highly suspicious for malignancy. He otherwise feels well and is asymptomatic. His no neurologic complaints. He denies any chest pain. He has a good appetite and denies weight loss. He denies any recent fevers or illnesses. He has no nausea, vomiting, constipation, or diarrhea. He has no urinary complaints. Patient otherwise feels well and offers no further specific complaints.  REVIEW OF SYSTEMS:   Review of Systems  Constitutional: Negative.  Negative for fever, malaise/fatigue and weight loss.  Respiratory: Positive for cough and shortness of breath. Negative for hemoptysis.   Cardiovascular: Negative.  Negative for chest pain and leg swelling.  Gastrointestinal: Negative.  Negative for abdominal pain.  Genitourinary: Negative.   Musculoskeletal: Positive for joint pain.  Neurological: Negative.  Negative for weakness.  Psychiatric/Behavioral: Negative.  The patient is not nervous/anxious.     As per HPI. Otherwise, a complete review of systems is negative.  PAST MEDICAL HISTORY: Past Medical History:  Diagnosis Date  . CAD (coronary artery disease) Nov. 12, 2012   Inferior ST elevation MI in November of 2012 with ventricular fibrillation arrest. Status post drug-eluting stent placement to the mid LAD. Most recent cardiac catheterization in June of this year showed patent stent with minimal restenosis, 75% proximal LAD and 80% distal LAD which were unchanged from before. Mild disease in the left circumflex and moderate RCA disease. Ejection  fraction was 55%.  . Cardiac arrest [facility]) Nov. 2012   x 2   . Cardiac arrest - ventricular fibrillation   . Diabetes mellitus   . Hyperlipidemia   . Hypertension   . Post PTCA 2013   x2  . ST elevation MI (STEMI) ([place])     PAST SURGICAL HISTORY: Past Surgical History:  Procedure Laterality Date  . CARDIAC CATHETERIZATION  2013   @ [place]; [facility] s/p stent     FAMILY HISTORY: Family History  Problem Relation Age of Onset  . Heart attack Mother   . Heart attack Father     ADVANCED DIRECTIVES (Y/N):  N  HEALTH MAINTENANCE: Social History  Substance Use Topics  . Smoking status: Former Research scientist (life sciences)  . Smokeless tobacco: Not on file  . Alcohol use No     Colonoscopy:  PAP:  Bone density:  Lipid panel:  No Known Allergies  Current Outpatient Prescriptions  Medication Sig Dispense Refill  . aspirin 81 MG tablet Take 81 mg by mouth daily.    Marland Kitchen lisinopril (PRINIVIL,ZESTRIL) 10 MG tablet Take 10 mg by mouth daily.     . metoprolol tartrate (LOPRESSOR) 12.5 mg TABS Take 12.5 mg by mouth 2 (two) times daily.      No current facility-administered medications for this visit.     OBJECTIVE: Vitals:   09/11/16 0842  BP: (!) 166/95  Pulse: 69  Resp: 18  Temp: 97.5 F (36.4 C)     Body mass index is 20.12 kg/m.    ECOG FS:1 - Symptomatic but completely ambulatory  General: Well-developed, well-nourished, no acute distress. Eyes: Pink conjunctiva, anicteric sclera. HEENT: Normocephalic, moist mucous membranes, clear oropharnyx. Lungs: Clear to auscultation bilaterally. Heart:  Regular rate and rhythm. No rubs, murmurs, or gallops. Abdomen: Soft, nontender, nondistended. No organomegaly noted, normoactive bowel sounds. Musculoskeletal: No edema, cyanosis, or clubbing. Neuro: Alert, answering all questions appropriately. Cranial nerves grossly intact. Skin: No rashes or petechiae noted. Psych: Normal affect. Lymphatics: No cervical, calvicular, axillary or inguinal  LAD.   LAB RESULTS:  Lab Results  Component Value Date   NA 137 09/03/2016   K 3.2 (L) 09/03/2016   CL 101 09/03/2016   CO2 29 09/03/2016   GLUCOSE 179 (H) 09/03/2016   BUN 11 09/03/2016   CREATININE 1.00 09/03/2016   CALCIUM 8.8 (L) 09/03/2016   PROT 7.4 11/28/2013   ALBUMIN 3.9 11/28/2013   AST 33 11/28/2013   ALT 36 11/28/2013   ALKPHOS 99 11/28/2013   BILITOT 0.6 11/28/2013   GFRNONAA >60 09/03/2016   GFRAA >60 09/03/2016    Lab Results  Component Value Date   WBC 17.3 (H) 09/03/2016   NEUTROABS 6.0 11/28/2013   HGB 16.1 09/03/2016   HCT 46.7 09/03/2016   MCV 89.6 09/03/2016   PLT 167 09/03/2016     STUDIES: Dg Chest 2 View  Result Date: 09/03/2016 CLINICAL DATA:  Dizziness and fall. Axillary pain. History of coronary artery disease, cardiac arrest, diabetes, hypertension. Former smoker. EXAM: CHEST  2 VIEW COMPARISON:  11/28/2013 FINDINGS: Normal heart size and pulmonary vascularity. Emphysematous changes in the lungs. No focal airspace disease or consolidation. No blunting of costophrenic angles. No pneumothorax. Mediastinal contours appear intact. Calcification of the aorta. Degenerative changes in the spine. IMPRESSION: Emphysematous changes in the lungs. No evidence of active pulmonary disease. Electronically Signed   By: Lucienne Capers M.D.   On: 09/03/2016 02:23   Ct Chest Wo Contrast  Result Date: 09/03/2016 CLINICAL DATA:  Cough and left-sided chest pain EXAM: CT CHEST WITHOUT CONTRAST TECHNIQUE: Multidetector CT imaging of the chest was performed following the standard protocol without IV contrast. COMPARISON:  Chest radiograph 09/03/2016 FINDINGS: Cardiovascular: Atherosclerotic calcification is present in the aortic arch. No aortic aneurysm. Pulmonary arteries are normal in size. The size of the heart is normal. No pericardial effusion. There are coronary artery calcifications. Mediastinum/Nodes: No mediastinal, hilar or axillary lymphadenopathy. The  visualized thyroid and thoracic esophageal course are unremarkable. Lungs/Pleura: There is a left upper lobe nodule measuring 1 cm with adjacent architectural distortion. There is bilateral emphysema. No pleural effusion or pneumothorax. No focal airspace consolidation. Upper Abdomen: The noncontrast appearance of the upper abdominal organs is unremarkable. Musculoskeletal: No chest wall mass or suspicious bone lesions identified. IMPRESSION: 1. 10 mm left upper lobe pulmonary nodule with surrounding architectural distortion is concerning for malignancy. 2. Emphysema. 3. No CT evidence of pneumonia. 4. Aortic and coronary artery atherosclerosis. Electronically Signed   By: Ulyses Jarred M.D.   On: 09/03/2016 03:50    ASSESSMENT: Left upper lobe pulmonary nodule  PLAN:   1.  Left upper lobe pulmonary nodule: CT scan results reviewed independently and reported as above. This is highly suspicious for underlying malignancy, therefore will get a PET scan for confirmation. If positive will pursue biopsy. Given patient's advanced age, he may not be a surgical candidate but would definitely benefit from SBRT. Return to clinic in 1 week to discuss the results and additional diagnostic planning if necessary.  Approximately 45 minutes was spent in discussion of which greater than 50% was consultation.  Patient expressed understanding and was in agreement with this plan. He also understands that He can call clinic at any time  with any questions, concerns, or complaints.   No matching staging information was found for the patient.  [name], MD   09/15/2016 6:50 AM

## 2016-09-11 ENCOUNTER — Inpatient Hospital Stay: Payer: Commercial Managed Care - HMO | Attending: Oncology | Admitting: Oncology

## 2016-09-11 VITALS — BP 166/95 | HR 69 | Temp 97.5°F | Resp 18 | Wt 140.2 lb

## 2016-09-11 DIAGNOSIS — I10 Essential (primary) hypertension: Secondary | ICD-10-CM | POA: Insufficient documentation

## 2016-09-11 DIAGNOSIS — E119 Type 2 diabetes mellitus without complications: Secondary | ICD-10-CM | POA: Insufficient documentation

## 2016-09-11 DIAGNOSIS — Z87891 Personal history of nicotine dependence: Secondary | ICD-10-CM | POA: Insufficient documentation

## 2016-09-11 DIAGNOSIS — Z7982 Long term (current) use of aspirin: Secondary | ICD-10-CM

## 2016-09-11 DIAGNOSIS — R911 Solitary pulmonary nodule: Secondary | ICD-10-CM | POA: Insufficient documentation

## 2016-09-11 DIAGNOSIS — R05 Cough: Secondary | ICD-10-CM | POA: Diagnosis not present

## 2016-09-11 DIAGNOSIS — I252 Old myocardial infarction: Secondary | ICD-10-CM | POA: Diagnosis not present

## 2016-09-11 DIAGNOSIS — J439 Emphysema, unspecified: Secondary | ICD-10-CM | POA: Insufficient documentation

## 2016-09-11 DIAGNOSIS — E785 Hyperlipidemia, unspecified: Secondary | ICD-10-CM | POA: Diagnosis not present

## 2016-09-11 DIAGNOSIS — Z79899 Other long term (current) drug therapy: Secondary | ICD-10-CM | POA: Diagnosis not present

## 2016-09-11 DIAGNOSIS — I700 Atherosclerosis of aorta: Secondary | ICD-10-CM

## 2016-09-11 DIAGNOSIS — I2510 Atherosclerotic heart disease of native coronary artery without angina pectoris: Secondary | ICD-10-CM

## 2016-09-11 NOTE — Progress Notes (Signed)
New evaluation for lung nodule. Complains of chronic shoulder pain.

## 2016-09-18 ENCOUNTER — Encounter: Admission: RE | Admit: 2016-09-18 | Payer: Commercial Managed Care - HMO | Source: Ambulatory Visit

## 2016-09-20 ENCOUNTER — Inpatient Hospital Stay: Payer: Commercial Managed Care - HMO | Admitting: Oncology

## 2016-09-24 ENCOUNTER — Other Ambulatory Visit: Payer: Self-pay | Admitting: Oncology

## 2016-09-24 ENCOUNTER — Encounter
Admission: RE | Admit: 2016-09-24 | Discharge: 2016-09-24 | Disposition: A | Discharge status: Home / Self Care | Payer: Commercial Managed Care - HMO | Source: Ambulatory Visit | Attending: Oncology | Admitting: Oncology

## 2016-09-24 DIAGNOSIS — R911 Solitary pulmonary nodule: Secondary | ICD-10-CM

## 2016-09-24 LAB — GLUCOSE, CAPILLARY: Glucose-Capillary: 105 mg/dL — ABNORMAL HIGH (ref 65–99)

## 2016-09-24 MED ORDER — FLUDEOXYGLUCOSE F - 18 (FDG) INJECTION
13.1900 | Freq: Once | INTRAVENOUS | Status: AC | PRN
  Administered 2016-09-24: 13.19 via INTRAVENOUS

## 2016-09-25 NOTE — Progress Notes (Signed)
[place]  [phone] [phone]  ID: Scott Gross OB: 12-Aug-1934  MR#: [id number]  YKZ#:[id number]  Patient Care Team: [name], MD as PCP - General (Family Medicine)  CHIEF COMPLAINT:  Left upper lobe pulmonary nodule.  INTERVAL HISTORY: Patient returns to clinic today for further evaluation and discussion of his PET scan results. He is anxious, but otherwise feels well. He has no neurologic complaints. He denies any chest pain, cough, or shortness of breath today. He has a good appetite and denies weight loss. He denies any recent fevers or illnesses. He has no nausea, vomiting, constipation, or diarrhea. He has no urinary complaints. Patient offers no further specific complaints.  REVIEW OF SYSTEMS:   Review of Systems  Constitutional: Negative.  Negative for fever, malaise/fatigue and weight loss.  Respiratory: Negative for cough, hemoptysis and shortness of breath.   Cardiovascular: Negative.  Negative for chest pain and leg swelling.  Gastrointestinal: Negative.  Negative for abdominal pain.  Genitourinary: Negative.   Musculoskeletal: Positive for joint pain.  Neurological: Negative.  Negative for weakness.  Psychiatric/Behavioral: The patient is nervous/anxious.     As per HPI. Otherwise, a complete review of systems is negative.  PAST MEDICAL HISTORY: Past Medical History:  Diagnosis Date  . CAD (coronary artery disease) Nov. 12, 2012   Inferior ST elevation MI in November of 2012 with ventricular fibrillation arrest. Status post drug-eluting stent placement to the mid LAD. Most recent cardiac catheterization in June of this year showed patent stent with minimal restenosis, 75% proximal LAD and 80% distal LAD which were unchanged from before. Mild disease in the left circumflex and moderate RCA disease. Ejection fraction was 55%.  . Cardiac arrest [facility]) Nov. 2012   x 2   . Cardiac arrest - ventricular fibrillation   .  Diabetes mellitus   . Hyperlipidemia   . Hypertension   . Post PTCA 2013   x2  . ST elevation MI (STEMI) ([place])     PAST SURGICAL HISTORY: Past Surgical History:  Procedure Laterality Date  . CARDIAC CATHETERIZATION  2013   @ [place]; [facility] s/p stent     FAMILY HISTORY: Family History  Problem Relation Age of Onset  . Heart attack Mother   . Heart attack Father     ADVANCED DIRECTIVES (Y/N):  N  HEALTH MAINTENANCE: Social History  Substance Use Topics  . Smoking status: Former Research scientist (life sciences)  . Smokeless tobacco: Not on file  . Alcohol use No     Colonoscopy:  PAP:  Bone density:  Lipid panel:  No Known Allergies  Current Outpatient Prescriptions  Medication Sig Dispense Refill  . aspirin 81 MG tablet Take 81 mg by mouth daily.    Marland Kitchen lisinopril (PRINIVIL,ZESTRIL) 10 MG tablet Take 10 mg by mouth daily.     . metoprolol tartrate (LOPRESSOR) 12.5 mg TABS Take 12.5 mg by mouth 2 (two) times daily.      No current facility-administered medications for this visit.     OBJECTIVE: Vitals:   09/26/16 1517  BP: (!) 182/99  Pulse: 62  Resp: 18  Temp: (!) 95.8 F (35.4 C)     Body mass index is 20.2 kg/m.    ECOG FS:1 - Symptomatic but completely ambulatory  General: Well-developed, well-nourished, no acute distress. Eyes: Pink conjunctiva, anicteric sclera. Lungs: Clear to auscultation bilaterally. Heart: Regular rate and rhythm. No rubs, murmurs, or gallops. Abdomen: Soft, nontender, nondistended. No organomegaly noted, normoactive bowel sounds. Musculoskeletal: No edema, cyanosis,  or clubbing. Neuro: Alert, answering all questions appropriately. Cranial nerves grossly intact. Skin: No rashes or petechiae noted. Psych: Normal affect.   LAB RESULTS:  Lab Results  Component Value Date   NA 137 09/03/2016   K 3.2 (L) 09/03/2016   CL 101 09/03/2016   CO2 29 09/03/2016   GLUCOSE 179 (H) 09/03/2016   BUN 11 09/03/2016   CREATININE 1.00 09/03/2016   CALCIUM 8.8  (L) 09/03/2016   PROT 7.4 11/28/2013   ALBUMIN 3.9 11/28/2013   AST 33 11/28/2013   ALT 36 11/28/2013   ALKPHOS 99 11/28/2013   BILITOT 0.6 11/28/2013   GFRNONAA >60 09/03/2016   GFRAA >60 09/03/2016    Lab Results  Component Value Date   WBC 17.3 (H) 09/03/2016   NEUTROABS 6.0 11/28/2013   HGB 16.1 09/03/2016   HCT 46.7 09/03/2016   MCV 89.6 09/03/2016   PLT 167 09/03/2016     STUDIES: Dg Chest 2 View  Result Date: 09/03/2016 CLINICAL DATA:  Dizziness and fall. Axillary pain. History of coronary artery disease, cardiac arrest, diabetes, hypertension. Former smoker. EXAM: CHEST  2 VIEW COMPARISON:  11/28/2013 FINDINGS: Normal heart size and pulmonary vascularity. Emphysematous changes in the lungs. No focal airspace disease or consolidation. No blunting of costophrenic angles. No pneumothorax. Mediastinal contours appear intact. Calcification of the aorta. Degenerative changes in the spine. IMPRESSION: Emphysematous changes in the lungs. No evidence of active pulmonary disease. Electronically Signed   By: Lucienne Capers M.D.   On: 09/03/2016 02:23   Ct Chest Wo Contrast  Result Date: 09/03/2016 CLINICAL DATA:  Cough and left-sided chest pain EXAM: CT CHEST WITHOUT CONTRAST TECHNIQUE: Multidetector CT imaging of the chest was performed following the standard protocol without IV contrast. COMPARISON:  Chest radiograph 09/03/2016 FINDINGS: Cardiovascular: Atherosclerotic calcification is present in the aortic arch. No aortic aneurysm. Pulmonary arteries are normal in size. The size of the heart is normal. No pericardial effusion. There are coronary artery calcifications. Mediastinum/Nodes: No mediastinal, hilar or axillary lymphadenopathy. The visualized thyroid and thoracic esophageal course are unremarkable. Lungs/Pleura: There is a left upper lobe nodule measuring 1 cm with adjacent architectural distortion. There is bilateral emphysema. No pleural effusion or pneumothorax. No focal  airspace consolidation. Upper Abdomen: The noncontrast appearance of the upper abdominal organs is unremarkable. Musculoskeletal: No chest wall mass or suspicious bone lesions identified. IMPRESSION: 1. 10 mm left upper lobe pulmonary nodule with surrounding architectural distortion is concerning for malignancy. 2. Emphysema. 3. No CT evidence of pneumonia. 4. Aortic and coronary artery atherosclerosis. Electronically Signed   By: Ulyses Jarred M.D.   On: 09/03/2016 03:50   Nm Pet Image Initial (pi) Skull Base To Thigh  Result Date: 09/24/2016 CLINICAL DATA:  Initial treatment strategy for left upper lobe pulmonary nodule. EXAM: NUCLEAR MEDICINE PET SKULL BASE TO THIGH TECHNIQUE: 13.2 mCi F-18 FDG was injected intravenously. Full-ring PET imaging was performed from the skull base to thigh after the radiotracer. CT data was obtained and used for attenuation correction and anatomic localization. FASTING BLOOD GLUCOSE:  Value: 105 mg/dl COMPARISON:  Chest CT on 09/03/2016 FINDINGS: NECK No hypermetabolic lymph nodes in the neck. CHEST 1.8 cm spiculated pulmonary nodule in the superior left upper lobe is hypermetabolic, with SUV max of 5.7. No other suspicious pulmonary nodules or masses identified. Mild emphysema noted. No evidence of pleural effusion. Aortic atherosclerosis coronary artery calcification noted. Multiple small mediastinal lymph nodes are seen in the aortopulmonary window, largest measuring 7 mm in short axis.  These show mild metabolic activity with SUV max of 2.8. A 10 mm right paratracheal lymph node on image 75/4 has metabolic activity with SUV max of 2.9. Mild hypermetabolic activity is also seen in both hilar regions. ABDOMEN/PELVIS No abnormal hypermetabolic activity within the liver, pancreas, adrenal glands, or spleen. No hypermetabolic lymph nodes in the abdomen or pelvis. Mildly enlarged prostate noted. SKELETON No focal hypermetabolic activity to suggest skeletal metastasis. IMPRESSION:  1.8 cm hypermetabolic spiculated nodule in the superior left upper lobe, consistent with primary bronchogenic carcinoma. Mild metabolic activity in bilateral mediastinal lymph nodes and bilateral hilar regions. Differential diagnosis includes metastatic and reactive lymphadenopathy. No evidence of distant metastatic disease. Mild emphysema. Electronically Signed   By: Earle Gell M.D.   On: 09/24/2016 12:39    ASSESSMENT: Left upper lobe pulmonary nodule  PLAN:   1.  Left upper lobe pulmonary nodule: PET scan results reviewed independently and reported as above. This is highly suspicious for underlying malignancy. Case was discussed at cancer conference, and after input from interventional radiology and pulmonology it was determined that bronchoscopy would likely be the best way to obtain a tissue sample for diagnosis. A referral was given to pulmonology. Given patient's advanced age, he may not be a surgical candidate but would definitely benefit from SBRT. Return to clinic one week after the biopsy for further evaluation and treatment planning.    Approximately 30 minutes was spent in discussion of which greater than 50% was consultation.  Patient expressed understanding and was in agreement with this plan. He also understands that He can call clinic at any time with any questions, concerns, or complaints.   No matching staging information was found for the patient.  [name], MD   09/29/2016 5:47 PM

## 2016-09-26 ENCOUNTER — Inpatient Hospital Stay: Payer: Commercial Managed Care - HMO | Attending: Oncology | Admitting: Oncology

## 2016-09-26 VITALS — BP 182/99 | HR 62 | Temp 95.8°F | Resp 18 | Wt 140.8 lb

## 2016-09-26 DIAGNOSIS — F419 Anxiety disorder, unspecified: Secondary | ICD-10-CM | POA: Insufficient documentation

## 2016-09-26 DIAGNOSIS — I252 Old myocardial infarction: Secondary | ICD-10-CM | POA: Diagnosis not present

## 2016-09-26 DIAGNOSIS — Z7982 Long term (current) use of aspirin: Secondary | ICD-10-CM | POA: Diagnosis not present

## 2016-09-26 DIAGNOSIS — Z79899 Other long term (current) drug therapy: Secondary | ICD-10-CM | POA: Insufficient documentation

## 2016-09-26 DIAGNOSIS — I2510 Atherosclerotic heart disease of native coronary artery without angina pectoris: Secondary | ICD-10-CM | POA: Diagnosis not present

## 2016-09-26 DIAGNOSIS — E119 Type 2 diabetes mellitus without complications: Secondary | ICD-10-CM | POA: Diagnosis not present

## 2016-09-26 DIAGNOSIS — Z87891 Personal history of nicotine dependence: Secondary | ICD-10-CM | POA: Diagnosis not present

## 2016-09-26 DIAGNOSIS — I700 Atherosclerosis of aorta: Secondary | ICD-10-CM | POA: Insufficient documentation

## 2016-09-26 DIAGNOSIS — J439 Emphysema, unspecified: Secondary | ICD-10-CM | POA: Insufficient documentation

## 2016-09-26 DIAGNOSIS — E785 Hyperlipidemia, unspecified: Secondary | ICD-10-CM | POA: Diagnosis not present

## 2016-09-26 DIAGNOSIS — I10 Essential (primary) hypertension: Secondary | ICD-10-CM | POA: Insufficient documentation

## 2016-09-26 DIAGNOSIS — M2550 Pain in unspecified joint: Secondary | ICD-10-CM | POA: Diagnosis not present

## 2016-09-26 DIAGNOSIS — R911 Solitary pulmonary nodule: Secondary | ICD-10-CM | POA: Diagnosis not present

## 2016-09-26 NOTE — Progress Notes (Signed)
Offers no complaints  

## 2016-09-27 ENCOUNTER — Other Ambulatory Visit: Payer: Self-pay | Admitting: Internal Medicine

## 2016-09-28 ENCOUNTER — Telehealth: Payer: Self-pay | Admitting: Oncology

## 2016-09-28 NOTE — Telephone Encounter (Signed)
She said we set him up to see Dr. Mortimer Fries on 10/22/16 but she thinks it might have been scheduled in error and wants you to call her about it please at [phone]. Thanks.

## 2016-10-01 ENCOUNTER — Telehealth: Payer: Self-pay | Admitting: Internal Medicine

## 2016-10-01 ENCOUNTER — Other Ambulatory Visit: Payer: Self-pay | Admitting: *Deleted

## 2016-10-01 DIAGNOSIS — R911 Solitary pulmonary nodule: Secondary | ICD-10-CM

## 2016-10-01 NOTE — Telephone Encounter (Signed)
Spoke with patient and clarified appts. Informed pt's spouse that we will call her back once we know when pt is scheduled for bronchoscopy. Pt's spouse verbalized understanding.

## 2016-10-01 NOTE — Telephone Encounter (Signed)
Called pt this morning to discuss appt with Dr. Mortimer Fries. Pt did not answer. Will attempt later today.

## 2016-10-01 NOTE — Telephone Encounter (Signed)
Pt spouse calling stating we had scheduled patient for a bronc Jan 8th  But they cancelled it thinking we did not take their insurance  She would like to reschedule for that day again Please call back to schedule

## 2016-10-02 NOTE — Telephone Encounter (Signed)
Pt has been scheduled to see KK on 10-22-16 AM. Nothing more needed at this time.

## 2016-10-22 ENCOUNTER — Institutional Professional Consult (permissible substitution): Payer: Commercial Managed Care - HMO | Admitting: Internal Medicine

## 2016-10-22 ENCOUNTER — Encounter: Payer: Self-pay | Admitting: Internal Medicine

## 2016-10-22 ENCOUNTER — Ambulatory Visit (INDEPENDENT_AMBULATORY_CARE_PROVIDER_SITE_OTHER): Payer: Commercial Managed Care - HMO | Admitting: Internal Medicine

## 2016-10-22 VITALS — BP 138/86 | HR 64 | Wt 139.0 lb

## 2016-10-22 DIAGNOSIS — R911 Solitary pulmonary nodule: Secondary | ICD-10-CM

## 2016-10-22 NOTE — Progress Notes (Signed)
[facility] Consultation      Date: 10/22/2016,   MRN# [id number] Scott Gross Nov 25, 1933 Code Status:  Code Status History    This patient does not have a recorded code status. Please follow your organizational policy for patients in this situation.     Hosp day:'@LENGTHOFSTAYDAYS'$ @ Referring MD: '@ATDPROV'$ @     PCP:      AdmissionWeight: 139 lb (63 kg)                 CurrentWeight: 139 lb (63 kg) Scott Gross is a 83 y.o. old male seen in consultation for lung nodule at the request of Dr. Oleta Mouse     CHIEF COMPLAINT:   I have a spot in my lungs   HISTORY OF PRESENT ILLNESS  83 yo pleasant white male seen today for abnormal CT chest and PET scan On 09/03/16 patient had been at home when he collapsed, he had chest cold and cough with congestion and s/p fall with sweats Wife called EMS and patient was assessed inER Patient was ruled out for MI, CT chest found to have LUL nodule approx 1 cm  Patient presents today without any acute issues He has no fevers, chills, NVD He has no weight loss, no night sweats  Remote smoker 1/2 ppd for 50 years Retired, has chronic shoulder pain  Patient was evaluated by Dr Grayland Ormond and after seeing him anddn discussing case in cancer conference, it was deemed that patient would need EBUS/ENB  Patient and family arrived today expecting to obtain procedure, I have explained to them that this will need to set up in the hospital   The Risks and Benefits of the Bronchoscopy with EBUS/ENB were explained to patient/family and I have discussed the risk for acute bleeding, increased chance of infection, increased chance of respiratory failure and cardiac arrest and death. I have also explained to avoid all types of NSAIDs to decrease chance of bleeding, and to avoid food and drinks the midnight prior to procedure.  The procedure consists of a video camera with a light source to be placed and inserted  into the lungs to  look  for abnormal tissue and to obtain tissue samples by using needle and biopsy tools.  The patient/family understand the risks and benefits and have decided to talk amongst themselves and assess for procedure at later time/date    PAST MEDICAL HISTORY   Past Medical History:  Diagnosis Date  . CAD (coronary artery disease) Nov. 12, 2012   Inferior ST elevation MI in November of 2012 with ventricular fibrillation arrest. Status post drug-eluting stent placement to the mid LAD. Most recent cardiac catheterization in June of this year showed patent stent with minimal restenosis, 75% proximal LAD and 80% distal LAD which were unchanged from before. Mild disease in the left circumflex and moderate RCA disease. Ejection fraction was 55%.  . Cardiac arrest [facility]) Nov. 2012   x 2   . Cardiac arrest - ventricular fibrillation   . Diabetes mellitus   . Hyperlipidemia   . Hypertension   . Post PTCA 2013   x2  . ST elevation MI (STEMI) [facility])      SURGICAL HISTORY   Past Surgical History:  Procedure Laterality Date  . CARDIAC CATHETERIZATION  2013   @ [place]; [facility] s/p stent      FAMILY HISTORY   Family History  Problem Relation Age of Onset  . Heart attack Mother   . Heart attack Father  SOCIAL HISTORY   Social History  Substance Use Topics  . Smoking status: Former Research scientist (life sciences)  . Smokeless tobacco: Never Used  . Alcohol use No     MEDICATIONS    Home Medication:  Current Outpatient Rx  . Order #: [id number] Class: Historical Med  . Order #: [id number] Class: Historical Med  . Order #: [id number] Class: Historical Med    Current Medication:  Current Outpatient Prescriptions:  .  aspirin 81 MG tablet, Take 81 mg by mouth daily., Disp: , Rfl:  .  lisinopril (PRINIVIL,ZESTRIL) 10 MG tablet, Take 10 mg by mouth daily. , Disp: , Rfl:  .  metoprolol tartrate (LOPRESSOR) 12.5 mg TABS, Take 12.5 mg by mouth 2 (two) times daily. , Disp: , Rfl:     ALLERGIES   Patient has no  known allergies.     REVIEW OF SYSTEMS   Review of Systems  Constitutional: Negative for chills, diaphoresis, fever, malaise/fatigue and weight loss.  HENT: Negative for congestion and hearing loss.   Eyes: Negative for blurred vision and double vision.  Respiratory: Negative for cough, hemoptysis, shortness of breath and wheezing.   Cardiovascular: Negative for chest pain, palpitations and orthopnea.  Gastrointestinal: Negative for abdominal pain, heartburn, nausea and vomiting.  Genitourinary: Negative for dysuria and urgency.  Musculoskeletal: Negative for back pain, myalgias and neck pain.  Skin: Negative for rash.  Neurological: Negative for dizziness, tingling, tremors, weakness and headaches.  Endo/Heme/Allergies: Does not bruise/bleed easily.  Psychiatric/Behavioral: Negative for depression, substance abuse and suicidal ideas.  All other systems reviewed and are negative.    VS: BP 138/86 (BP Location: Left Arm, Cuff Size: Normal)   Pulse 64   Wt 139 lb (63 kg)   SpO2 99%   BMI 19.94 kg/m      PHYSICAL EXAM  Physical Exam  Constitutional: He is oriented to person, place, and time. He appears well-developed and well-nourished. No distress.  HENT:  Head: Normocephalic and atraumatic.  Mouth/Throat: No oropharyngeal exudate.  Eyes: EOM are normal. Pupils are equal, round, and reactive to light. No scleral icterus.  Neck: Normal range of motion. Neck supple.  Cardiovascular: Normal rate, regular rhythm and normal heart sounds.   No murmur heard. Pulmonary/Chest: No stridor. No respiratory distress. He has no wheezes.  Abdominal: Soft. Bowel sounds are normal.  Musculoskeletal: Normal range of motion. He exhibits no edema.  Neurological: He is alert and oriented to person, place, and time. He displays normal reflexes. Coordination normal.  Skin: Skin is warm. He is not diaphoretic.  Psychiatric: He has a normal mood and affect.         IMAGING    Nm Pet  Image Initial (pi) Skull Base To Thigh  Result Date: 09/24/2016 CLINICAL DATA:  Initial treatment strategy for left upper lobe pulmonary nodule. EXAM: NUCLEAR MEDICINE PET SKULL BASE TO THIGH TECHNIQUE: 13.2 mCi F-18 FDG was injected intravenously. Full-ring PET imaging was performed from the skull base to thigh after the radiotracer. CT data was obtained and used for attenuation correction and anatomic localization. FASTING BLOOD GLUCOSE:  Value: 105 mg/dl COMPARISON:  Chest CT on 09/03/2016 FINDINGS: NECK No hypermetabolic lymph nodes in the neck. CHEST 1.8 cm spiculated pulmonary nodule in the superior left upper lobe is hypermetabolic, with SUV max of 5.7. No other suspicious pulmonary nodules or masses identified. Mild emphysema noted. No evidence of pleural effusion. Aortic atherosclerosis coronary artery calcification noted. Multiple small mediastinal lymph nodes are seen in the aortopulmonary window, largest measuring 7 mm  in short axis. These show mild metabolic activity with SUV max of 2.8. A 10 mm right paratracheal lymph node on image 16/1 has metabolic activity with SUV max of 2.9. Mild hypermetabolic activity is also seen in both hilar regions. ABDOMEN/PELVIS No abnormal hypermetabolic activity within the liver, pancreas, adrenal glands, or spleen. No hypermetabolic lymph nodes in the abdomen or pelvis. Mildly enlarged prostate noted. SKELETON No focal hypermetabolic activity to suggest skeletal metastasis. IMPRESSION: 1.8 cm hypermetabolic spiculated nodule in the superior left upper lobe, consistent with primary bronchogenic carcinoma. Mild metabolic activity in bilateral mediastinal lymph nodes and bilateral hilar regions. Differential diagnosis includes metastatic and reactive lymphadenopathy. No evidence of distant metastatic disease. Mild emphysema. Electronically Signed   By: Earle Gell M.D.   On: 09/24/2016 12:39      Images reviewed 10/22/2016   ASSESSMENT/PLAN   83 yo pleasant   white male seen today for abnormal CT chest and PET scan with LUL nodule apporx 1 cm and also some mediastinal adenopathy  At this time, this is highly suspicious for malignancy, pneumonia is less likely.  Patient will need further diagnostic work up with EBUS/ENB if he wishes to pursue dx and therapy   I have explained 3 options to patient  Option 1-procedd with EBUS and ENB, risks explained to patient and family Option 2-follow up CT chest in 2 months and assess for procedure at that time Option 3-do nothing and forget about the nodule   Patient will call us back and decide which option he wants   I have personally obtained a history, examined the patient, evaluated laboratory and independently reviewed imaging results, formulated the assessment and plan and placed orders.  The Patient requires high complexity decision making for assessment and support, frequent evaluation and titration of therapies, application of advanced monitoring technologies and extensive interpretation of multiple databases.    Patient/Family are very satisfied with Plan of action and management. All questions answered  Corrin Parker, M.D.  [facility]  Medical Director [place] Director [facility] Cardio-Pulmonary Department

## 2016-10-22 NOTE — Patient Instructions (Signed)
Will plan for ENB/EBUS if patient decides to undergo procedure

## 2016-10-25 ENCOUNTER — Ambulatory Visit: Payer: Commercial Managed Care - HMO | Admitting: Oncology

## 2016-10-29 NOTE — Progress Notes (Addendum)
[place]  [phone] [phone]  ID: Scott Gross OB: 05-Oct-1934  MR#: [id number]  FWY#:[id number]  Patient Care Team: [name], MD as PCP - General (Family Medicine)  CHIEF COMPLAINT:  Left upper lobe pulmonary nodule.  INTERVAL HISTORY: Patient returns to clinic today for further evaluation and discussion of his PET scan results. He recently met with Dr. Mortimer Fries, but has yet to undergo biopsy. He is anxious, but otherwise feels well. He has no neurologic complaints. He denies any chest pain, cough, or shortness of breath today. He has a good appetite and denies weight loss. He denies any recent fevers or illnesses. He has no nausea, vomiting, constipation, or diarrhea. He has no urinary complaints. Patient offers no further specific complaints.  REVIEW OF SYSTEMS:   Review of Systems  Constitutional: Negative.  Negative for fever, malaise/fatigue and weight loss.  Respiratory: Negative for cough, hemoptysis and shortness of breath.   Cardiovascular: Negative.  Negative for chest pain and leg swelling.  Gastrointestinal: Negative.  Negative for abdominal pain.  Genitourinary: Negative.   Musculoskeletal: Positive for joint pain.  Neurological: Negative.  Negative for weakness.  Psychiatric/Behavioral: The patient is nervous/anxious.     As per HPI. Otherwise, a complete review of systems is negative.  PAST MEDICAL HISTORY: Past Medical History:  Diagnosis Date  . CAD (coronary artery disease) Nov. 12, 2012   Inferior ST elevation MI in November of 2012 with ventricular fibrillation arrest. Status post drug-eluting stent placement to the mid LAD. Most recent cardiac catheterization in June of this year showed patent stent with minimal restenosis, 75% proximal LAD and 80% distal LAD which were unchanged from before. Mild disease in the left circumflex and moderate RCA disease. Ejection fraction was 55%.  . Cardiac arrest [facility]) Nov. 2012     x 2   . Cardiac arrest - ventricular fibrillation   . Diabetes mellitus   . Hyperlipidemia   . Hypertension   . Post PTCA 2013   x2  . ST elevation MI (STEMI) ([place])     PAST SURGICAL HISTORY: Past Surgical History:  Procedure Laterality Date  . CARDIAC CATHETERIZATION  2013   @ [place]; [facility] s/p stent     FAMILY HISTORY: Family History  Problem Relation Age of Onset  . Heart attack Mother   . Heart attack Father     ADVANCED DIRECTIVES (Y/N):  N  HEALTH MAINTENANCE: Social History  Substance Use Topics  . Smoking status: Former Research scientist (life sciences)  . Smokeless tobacco: Never Used  . Alcohol use No     Colonoscopy:  PAP:  Bone density:  Lipid panel:  No Known Allergies  Current Outpatient Prescriptions  Medication Sig Dispense Refill  . aspirin 81 MG tablet Take 81 mg by mouth daily.    Marland Kitchen lisinopril (PRINIVIL,ZESTRIL) 10 MG tablet Take 10 mg by mouth daily.     . metoprolol tartrate (LOPRESSOR) 12.5 mg TABS Take 12.5 mg by mouth 2 (two) times daily.      No current facility-administered medications for this visit.     OBJECTIVE: Vitals:   10/30/16 1522 10/30/16 1529  BP: (!) 198/99 (!) 187/104  Pulse: 67   Resp: 18   Temp: (!) 96.9 F (36.1 C)      Body mass index is 20.21 kg/m.    ECOG FS:1 - Symptomatic but completely ambulatory  General: Well-developed, well-nourished, no acute distress. Eyes: Pink conjunctiva, anicteric sclera. Lungs: Clear to auscultation bilaterally. Heart: Regular rate and rhythm.  No rubs, murmurs, or gallops. Abdomen: Soft, nontender, nondistended. No organomegaly noted, normoactive bowel sounds. Musculoskeletal: No edema, cyanosis, or clubbing. Neuro: Alert, answering all questions appropriately. Cranial nerves grossly intact. Skin: No rashes or petechiae noted. Psych: Normal affect.   LAB RESULTS:  Lab Results  Component Value Date   NA 137 09/03/2016   K 3.2 (L) 09/03/2016   CL 101 09/03/2016   CO2 29 09/03/2016    GLUCOSE 179 (H) 09/03/2016   BUN 11 09/03/2016   CREATININE 1.00 09/03/2016   CALCIUM 8.8 (L) 09/03/2016   PROT 7.4 11/28/2013   ALBUMIN 3.9 11/28/2013   AST 33 11/28/2013   ALT 36 11/28/2013   ALKPHOS 99 11/28/2013   BILITOT 0.6 11/28/2013   GFRNONAA >60 09/03/2016   GFRAA >60 09/03/2016    Lab Results  Component Value Date   WBC 17.3 (H) 09/03/2016   NEUTROABS 6.0 11/28/2013   HGB 16.1 09/03/2016   HCT 46.7 09/03/2016   MCV 89.6 09/03/2016   PLT 167 09/03/2016     STUDIES: No results found.  ASSESSMENT: Left upper lobe pulmonary nodule  PLAN:   1.  Left upper lobe pulmonary nodule: PET scan results reviewed independently highly suspicious for underlying malignancy. Case was previously discussed at cancer conference, and after input from interventional radiology and pulmonology it was determined that bronchoscopy/EBUS would likely be the best way to obtain a tissue sample for diagnosis. Patient has been evaluated by Dr. Stoney Bang and at that time was unclear if he wanted to pursue biopsy. After lengthy discussion with his wife and daughter present, he has decided that he wishes to pursue biopsy. Given his advanced age, would not recommend surgery or chemotherapy, but radiation therapy using SBRT may offer some benefit. Return to clinic one week after the biopsy for further evaluation and treatment planning.   2. Hypertension: Patient's blood pressure is significantly elevated today. Continue evaluation and treatment for primary care.  Approximately 30 minutes was spent in discussion of which greater than 50% was consultation.  Patient expressed understanding and was in agreement with this plan. He also understands that He can call clinic at any time with any questions, concerns, or complaints.   No matching staging information was found for the patient.  [name], MD   10/30/2016 5:08 PM

## 2016-10-30 ENCOUNTER — Telehealth: Payer: Self-pay | Admitting: *Deleted

## 2016-10-30 ENCOUNTER — Inpatient Hospital Stay: Payer: Medicare HMO | Attending: Oncology | Admitting: Oncology

## 2016-10-30 VITALS — BP 187/104 | HR 67 | Temp 96.9°F | Resp 18 | Wt 140.9 lb

## 2016-10-30 DIAGNOSIS — Z7982 Long term (current) use of aspirin: Secondary | ICD-10-CM | POA: Diagnosis not present

## 2016-10-30 DIAGNOSIS — E119 Type 2 diabetes mellitus without complications: Secondary | ICD-10-CM | POA: Diagnosis not present

## 2016-10-30 DIAGNOSIS — Z8674 Personal history of sudden cardiac arrest: Secondary | ICD-10-CM | POA: Diagnosis not present

## 2016-10-30 DIAGNOSIS — I2510 Atherosclerotic heart disease of native coronary artery without angina pectoris: Secondary | ICD-10-CM | POA: Diagnosis not present

## 2016-10-30 DIAGNOSIS — R911 Solitary pulmonary nodule: Secondary | ICD-10-CM | POA: Insufficient documentation

## 2016-10-30 DIAGNOSIS — Z87891 Personal history of nicotine dependence: Secondary | ICD-10-CM | POA: Insufficient documentation

## 2016-10-30 DIAGNOSIS — F419 Anxiety disorder, unspecified: Secondary | ICD-10-CM | POA: Insufficient documentation

## 2016-10-30 DIAGNOSIS — I10 Essential (primary) hypertension: Secondary | ICD-10-CM | POA: Diagnosis not present

## 2016-10-30 DIAGNOSIS — E785 Hyperlipidemia, unspecified: Secondary | ICD-10-CM

## 2016-10-30 DIAGNOSIS — Z79899 Other long term (current) drug therapy: Secondary | ICD-10-CM | POA: Diagnosis not present

## 2016-10-30 NOTE — Telephone Encounter (Signed)
Received a message from [place] at [place] office and from pt's daughter stating the pt wants to proceed with the bronch. Please advise if we are doing an EBUS or ENB. Once scheduled I will call pt and daughter Jacqlyn Larsen (cell: [phone]; work: [phone] ext [phone]) who is also a nurse with date and time with instructions.

## 2016-10-30 NOTE — Progress Notes (Signed)
Complains of intermittent pain in left upper chest. Pt states that is waiting for Dr. Mortimer Fries to schedule bronchoscopy.

## 2016-11-05 ENCOUNTER — Telehealth: Payer: Self-pay | Admitting: Internal Medicine

## 2016-11-05 NOTE — Telephone Encounter (Signed)
Pt wife calling stating Dr Grayland Ormond suggested they call us and schedule patient to have a bronc done Please call back

## 2016-11-05 NOTE — Telephone Encounter (Signed)
Spoke with wife and informed her once I spoke with DK I will schedule procedure and call her, the patient and daughter back. Will close this encounter due to another encounter for the same open.

## 2016-11-05 NOTE — Telephone Encounter (Signed)
He will need repeat CT chest with Contrast for lung mass and adenopathy, then will assess for ENB and or EBUS

## 2016-11-05 NOTE — Telephone Encounter (Signed)
Have sent message to DK to find out what kind of bronch he wants scheduled and then I will schedule and call pt and family.

## 2016-11-06 NOTE — Telephone Encounter (Signed)
Spoke with daughter and she states that she will explain the that the pt needs to get the CT scan and why. Please call daughter Jacqlyn Larsen on her cell number when scheduled and she will inform the pt. Once CT done will schedule a bronch per DK.

## 2016-11-06 NOTE — Telephone Encounter (Signed)
Spoke with daughter and she is asking if and why pt needs CT w/contrast since he had a PET scan. Please advise. Thanks.

## 2016-11-06 NOTE — Telephone Encounter (Signed)
CT Chest with contrast scheduled for Wed 11/14/16 at 10:30 at [facility]. Pt to arrive at 10:00 for labs then CT Scan. Called and spoke with pt's daughter, Jacqlyn Larsen, gave her the above information. Liquids Only 4 hours prior to scan. Advised patient that authorization from insurance has been obtained. Gave daughter my number in case patient has any questions. Rhonda J Cobb

## 2016-11-06 NOTE — Telephone Encounter (Signed)
Check enlargement of lymph nodes and lung mass

## 2016-11-06 NOTE — Telephone Encounter (Signed)
LMOM for daughter to return call.

## 2016-11-14 ENCOUNTER — Ambulatory Visit
Admission: RE | Admit: 2016-11-14 | Discharge: 2016-11-14 | Disposition: A | Discharge status: Home / Self Care | Payer: Medicare HMO | Source: Ambulatory Visit | Attending: Internal Medicine | Admitting: Internal Medicine

## 2016-11-14 DIAGNOSIS — J439 Emphysema, unspecified: Secondary | ICD-10-CM | POA: Diagnosis not present

## 2016-11-14 DIAGNOSIS — R911 Solitary pulmonary nodule: Secondary | ICD-10-CM | POA: Diagnosis not present

## 2016-11-14 DIAGNOSIS — R918 Other nonspecific abnormal finding of lung field: Secondary | ICD-10-CM | POA: Diagnosis not present

## 2016-11-14 LAB — POCT I-STAT CREATININE: CREATININE: 1 mg/dL (ref 0.61–1.24)

## 2016-11-14 MED ORDER — IOPAMIDOL (ISOVUE-300) INJECTION 61%
75.0000 mL | Freq: Once | INTRAVENOUS | Status: AC | PRN
  Administered 2016-11-14: 75 mL via INTRAVENOUS

## 2016-11-15 DIAGNOSIS — C3412 Malignant neoplasm of upper lobe, left bronchus or lung: Secondary | ICD-10-CM

## 2016-11-15 HISTORY — DX: Malignant neoplasm of upper lobe, left bronchus or lung: C34.12

## 2016-11-22 NOTE — Telephone Encounter (Signed)
Spoke with daughter Jacqlyn Larsen and she states they were under the impression they were still doing a bronch. Per DK when they saw Grayland Ormond they had decided to do a bx. DK states he will speak with Grayland Ormond and they will decide which is best for the patient and then we will call daughter back.

## 2016-11-22 NOTE — Telephone Encounter (Signed)
Per DK will schedule pt for a ENB on 11/27/16. Will start process to schedule and call pt and daughter.

## 2016-11-22 NOTE — Telephone Encounter (Signed)
ENB scheduled and daughter informed. Nothing further needed.

## 2016-11-23 ENCOUNTER — Encounter
Admission: RE | Admit: 2016-11-23 | Discharge: 2016-11-23 | Disposition: A | Discharge status: Home / Self Care | Payer: Medicare HMO | Source: Ambulatory Visit | Attending: Internal Medicine | Admitting: Internal Medicine

## 2016-11-23 ENCOUNTER — Telehealth: Payer: Self-pay | Admitting: Internal Medicine

## 2016-11-23 DIAGNOSIS — I2510 Atherosclerotic heart disease of native coronary artery without angina pectoris: Secondary | ICD-10-CM | POA: Diagnosis not present

## 2016-11-23 DIAGNOSIS — I25719 Atherosclerosis of autologous vein coronary artery bypass graft(s) with unspecified angina pectoris: Secondary | ICD-10-CM | POA: Diagnosis not present

## 2016-11-23 DIAGNOSIS — Z01812 Encounter for preprocedural laboratory examination: Secondary | ICD-10-CM | POA: Insufficient documentation

## 2016-11-23 DIAGNOSIS — R001 Bradycardia, unspecified: Secondary | ICD-10-CM | POA: Insufficient documentation

## 2016-11-23 DIAGNOSIS — Z01810 Encounter for preprocedural cardiovascular examination: Secondary | ICD-10-CM | POA: Diagnosis not present

## 2016-11-23 HISTORY — DX: Unspecified osteoarthritis, unspecified site: M19.90

## 2016-11-23 HISTORY — DX: Unspecified hearing loss, unspecified ear: H91.90

## 2016-11-23 LAB — POTASSIUM: Potassium: 3.9 mmol/L (ref 3.5–5.1)

## 2016-11-23 NOTE — Telephone Encounter (Signed)
Please advise on CT results since DK not available. thanks

## 2016-11-23 NOTE — Patient Instructions (Signed)
  Your procedure is scheduled on: August 27, 2017 (Tuesday) Report to Same Day Surgery 2nd floor medical mall [facility] Entrance-take elevator on left to 2nd floor.  Check in with surgery information desk.) To find out your arrival time please call [phone] between 1PM - 3PM on August 26, 2017 (Monday)  Remember: Instructions that are not followed completely may result in serious medical risk, up to and including death, or upon the discretion of your surgeon and anesthesiologist your surgery may need to be rescheduled.    _x___ 1. Do not eat food or drink liquids after midnight. No gum chewing or hard candies.     __x__ 2. No Alcohol for 24 hours before or after surgery.   __x__3. No Smoking for 24 prior to surgery.   ____  4. Bring all medications with you on the day of surgery if instructed.    __x__ 5. Notify your doctor if there is any change in your medical condition     (cold, fever, infections).     Do not wear jewelry, make-up, hairpins, clips or nail polish.  Do not wear lotions, powders, or perfumes. You may wear deodorant.  Do not shave 48 hours prior to surgery. Men may shave face and neck.  Do not bring valuables to the hospital.    [facility] is not responsible for any belongings or valuables.               Contacts, dentures or bridgework may not be worn into surgery.  Leave your suitcase in the car. After surgery it may be brought to your room.  For patients admitted to the hospital, discharge time is determined by your treatment team.   Patients discharged the day of surgery will not be allowed to drive home.  You will need someone to drive you home and stay with you the night of your procedure.    Please read over the following fact sheets that you were given:   [facility] Preparing for Surgery and or MRSA Information   _x___ Take these medicines the morning of surgery with A SIP OF WATER:    1. LISINOPRIL  2.  METOPROLOL  3.  4.  5.  6.  ____Fleets enema or Magnesium Citrate as directed.   ___ Use CHG Soap or sage wipes as directed on instruction sheet   ____ Use inhalers on the day of surgery and bring to hospital day of surgery  ____ Stop metformin 2 days prior to surgery    ____ Take 1/2 of usual insulin dose the night before surgery and none on the morning of           surgery.   __x__ Stop Aspirin, Coumadin, Pllavix ,Eliquis, Effient, or Pradaxa (PATIENT STOPPED ASPIRIN ON FEBRUARY   8)   x__ Stop Anti-inflammatories such as Advil, Aleve, Ibuprofen, Motrin, Naproxen,          Naprosyn, Goodies powders or aspirin products. Ok to take Tylenol.   ____ Stop supplements until after surgery.    ____ Bring C-Pap to the hospital.

## 2016-11-23 NOTE — Telephone Encounter (Signed)
Received printed CT results from DS. Will call family.

## 2016-11-23 NOTE — Telephone Encounter (Signed)
Per DS, let family know that nodule/spot  In left upper lobe that has grown in size since previous CT in November. It appears to be accessible to biopsy through bronch as planned by DK.   Spoke with daughter Scott Gross and gave her the results of CT per DS. Will give copy to pt per their request. Nothing further needed.

## 2016-11-23 NOTE — Telephone Encounter (Signed)
Patient had recent ct scan and wants results prior to bronch .  Please talk to her while here or call .

## 2016-11-27 ENCOUNTER — Ambulatory Visit: Payer: Medicare HMO | Admitting: Certified Registered"

## 2016-11-27 ENCOUNTER — Ambulatory Visit: Payer: Medicare HMO

## 2016-11-27 ENCOUNTER — Ambulatory Visit
Admission: RE | Admit: 2016-11-27 | Discharge: 2016-11-27 | Disposition: A | Discharge status: Home / Self Care | Payer: Medicare HMO | Source: Ambulatory Visit | Attending: Internal Medicine | Admitting: Internal Medicine

## 2016-11-27 ENCOUNTER — Encounter
Admission: RE | Disposition: A | Discharge status: Home / Self Care | Payer: Self-pay | Source: Ambulatory Visit | Attending: Internal Medicine

## 2016-11-27 ENCOUNTER — Encounter: Payer: Self-pay | Admitting: *Deleted

## 2016-11-27 DIAGNOSIS — I25119 Atherosclerotic heart disease of native coronary artery with unspecified angina pectoris: Secondary | ICD-10-CM | POA: Diagnosis not present

## 2016-11-27 DIAGNOSIS — R918 Other nonspecific abnormal finding of lung field: Secondary | ICD-10-CM

## 2016-11-27 DIAGNOSIS — R599 Enlarged lymph nodes, unspecified: Secondary | ICD-10-CM

## 2016-11-27 DIAGNOSIS — Z8674 Personal history of sudden cardiac arrest: Secondary | ICD-10-CM | POA: Diagnosis not present

## 2016-11-27 DIAGNOSIS — E119 Type 2 diabetes mellitus without complications: Secondary | ICD-10-CM | POA: Diagnosis not present

## 2016-11-27 DIAGNOSIS — R591 Generalized enlarged lymph nodes: Secondary | ICD-10-CM

## 2016-11-27 DIAGNOSIS — E785 Hyperlipidemia, unspecified: Secondary | ICD-10-CM | POA: Diagnosis not present

## 2016-11-27 DIAGNOSIS — Z79899 Other long term (current) drug therapy: Secondary | ICD-10-CM | POA: Diagnosis not present

## 2016-11-27 DIAGNOSIS — I10 Essential (primary) hypertension: Secondary | ICD-10-CM | POA: Diagnosis not present

## 2016-11-27 DIAGNOSIS — I2510 Atherosclerotic heart disease of native coronary artery without angina pectoris: Secondary | ICD-10-CM | POA: Diagnosis not present

## 2016-11-27 DIAGNOSIS — R911 Solitary pulmonary nodule: Secondary | ICD-10-CM | POA: Diagnosis not present

## 2016-11-27 DIAGNOSIS — Z7982 Long term (current) use of aspirin: Secondary | ICD-10-CM | POA: Diagnosis not present

## 2016-11-27 DIAGNOSIS — C3412 Malignant neoplasm of upper lobe, left bronchus or lung: Secondary | ICD-10-CM | POA: Diagnosis not present

## 2016-11-27 DIAGNOSIS — Z87891 Personal history of nicotine dependence: Secondary | ICD-10-CM | POA: Diagnosis not present

## 2016-11-27 DIAGNOSIS — I252 Old myocardial infarction: Secondary | ICD-10-CM | POA: Insufficient documentation

## 2016-11-27 HISTORY — PX: ELECTROMAGNETIC NAVIGATION BROCHOSCOPY: SHX5369

## 2016-11-27 SURGERY — ELECTROMAGNETIC NAVIGATION BRONCHOSCOPY
Anesthesia: General

## 2016-11-27 MED ORDER — ROCURONIUM BROMIDE 50 MG/5ML IV SOSY
PREFILLED_SYRINGE | INTRAVENOUS | Status: AC
  Filled 2016-11-27: qty 5

## 2016-11-27 MED ORDER — LIDOCAINE HCL (CARDIAC) 20 MG/ML IV SOLN
INTRAVENOUS | Status: DC | PRN
  Administered 2016-11-27: 80 mg via INTRAVENOUS

## 2016-11-27 MED ORDER — LACTATED RINGERS IV SOLN
INTRAVENOUS | Status: DC | PRN
  Administered 2016-11-27 (×2): via INTRAVENOUS

## 2016-11-27 MED ORDER — PROPOFOL 10 MG/ML IV BOLUS
INTRAVENOUS | Status: AC
Start: 2016-11-27 — End: 2016-11-27
  Filled 2016-11-27: qty 20

## 2016-11-27 MED ORDER — ONDANSETRON HCL 4 MG/2ML IJ SOLN
INTRAMUSCULAR | Status: DC | PRN
  Administered 2016-11-27: 4 mg via INTRAVENOUS

## 2016-11-27 MED ORDER — PROPOFOL 10 MG/ML IV BOLUS
INTRAVENOUS | Status: AC
  Filled 2016-11-27: qty 20

## 2016-11-27 MED ORDER — ONDANSETRON HCL 4 MG/2ML IJ SOLN: 4.0000 mg | Freq: Once | INTRAMUSCULAR | Status: DC | PRN

## 2016-11-27 MED ORDER — PHENYLEPHRINE HCL 10 MG/ML IJ SOLN
INTRAMUSCULAR | Status: AC
  Filled 2016-11-27: qty 1

## 2016-11-27 MED ORDER — PHENYLEPHRINE HCL 10 MG/ML IJ SOLN
INTRAMUSCULAR | Status: DC | PRN
Start: 2016-11-27 — End: 2016-11-27
  Administered 2016-11-27 (×2): 100 ug via INTRAVENOUS

## 2016-11-27 MED ORDER — LIDOCAINE 2% (20 MG/ML) 5 ML SYRINGE
INTRAMUSCULAR | Status: AC
  Filled 2016-11-27: qty 5

## 2016-11-27 MED ORDER — FAMOTIDINE 20 MG PO TABS
ORAL_TABLET | ORAL | Status: AC
  Administered 2016-11-27: 20 mg via ORAL
  Filled 2016-11-27: qty 1

## 2016-11-27 MED ORDER — PROPOFOL 500 MG/50ML IV EMUL
INTRAVENOUS | Status: DC | PRN
Start: 2016-11-27 — End: 2016-11-27
  Administered 2016-11-27: 75 ug/kg/min via INTRAVENOUS

## 2016-11-27 MED ORDER — FAMOTIDINE 20 MG PO TABS
20.0000 mg | ORAL_TABLET | Freq: Once | ORAL | Status: AC
  Administered 2016-11-27: 20 mg via ORAL

## 2016-11-27 MED ORDER — MIDAZOLAM HCL 2 MG/2ML IJ SOLN: 2.0000 mg | Freq: Once | INTRAMUSCULAR | Status: DC

## 2016-11-27 MED ORDER — ETOMIDATE 2 MG/ML IV SOLN
INTRAVENOUS | Status: AC
Start: 2016-11-27 — End: 2016-11-27
  Filled 2016-11-27: qty 10

## 2016-11-27 MED ORDER — ETOMIDATE 2 MG/ML IV SOLN
INTRAVENOUS | Status: DC | PRN
  Administered 2016-11-27: 18 mg via INTRAVENOUS

## 2016-11-27 MED ORDER — ONDANSETRON HCL 4 MG/2ML IJ SOLN
INTRAMUSCULAR | Status: AC
  Filled 2016-11-27: qty 2

## 2016-11-27 MED ORDER — LACTATED RINGERS IV SOLN: INTRAVENOUS | Status: DC

## 2016-11-27 MED ORDER — METOPROLOL TARTRATE 25 MG PO TABS
25.0000 mg | ORAL_TABLET | Freq: Once | ORAL | Status: AC
  Administered 2016-11-27: 25 mg via ORAL

## 2016-11-27 MED ORDER — METOPROLOL TARTRATE 25 MG PO TABS
ORAL_TABLET | ORAL | Status: AC
  Administered 2016-11-27: 25 mg via ORAL
  Filled 2016-11-27: qty 1

## 2016-11-27 MED ORDER — SUCCINYLCHOLINE CHLORIDE 200 MG/10ML IV SOSY
PREFILLED_SYRINGE | INTRAVENOUS | Status: AC
  Filled 2016-11-27: qty 10

## 2016-11-27 MED ORDER — FENTANYL CITRATE (PF) 100 MCG/2ML IJ SOLN
INTRAMUSCULAR | Status: AC
  Filled 2016-11-27: qty 2

## 2016-11-27 MED ORDER — FENTANYL CITRATE (PF) 100 MCG/2ML IJ SOLN
INTRAMUSCULAR | Status: DC | PRN
  Administered 2016-11-27: 75 ug via INTRAVENOUS
  Administered 2016-11-27: 25 ug via INTRAVENOUS

## 2016-11-27 MED ORDER — FENTANYL CITRATE (PF) 100 MCG/2ML IJ SOLN: 25.0000 ug | INTRAMUSCULAR | Status: DC | PRN

## 2016-11-27 MED ORDER — MIDAZOLAM HCL 2 MG/2ML IJ SOLN
INTRAMUSCULAR | Status: AC
  Administered 2016-11-27: 2 mg
  Filled 2016-11-27: qty 2

## 2016-11-27 MED ORDER — ROCURONIUM BROMIDE 100 MG/10ML IV SOLN
INTRAVENOUS | Status: DC | PRN
  Administered 2016-11-27: 20 mg via INTRAVENOUS
  Administered 2016-11-27: 5 mg via INTRAVENOUS

## 2016-11-27 MED ORDER — SUCCINYLCHOLINE CHLORIDE 20 MG/ML IJ SOLN
INTRAMUSCULAR | Status: DC | PRN
  Administered 2016-11-27: 100 mg via INTRAVENOUS

## 2016-11-27 MED ORDER — SUGAMMADEX SODIUM 200 MG/2ML IV SOLN
INTRAVENOUS | Status: DC | PRN
  Administered 2016-11-27: 140 mg via INTRAVENOUS

## 2016-11-27 NOTE — Interval H&P Note (Signed)
History and Physical Interval Note:  11/27/2016 12:14 PM  Scott Gross  has presented today for surgery, with the diagnosis of LUNG NODULE  The various methods of treatment have been discussed with the patient and family. After consideration of risks, benefits and other options for treatment, the patient has consented to  Procedure(s): ELECTROMAGNETIC NAVIGATION BRONCHOSCOPY (N/A) /EBUSas a surgical intervention .  The patient's history has been reviewed, patient examined, no change in status, stable for surgery.  I have reviewed the patient's chart and labs.  Questions were answered to the patient's satisfaction.     [name]

## 2016-11-27 NOTE — Op Note (Signed)
PROCEDURE: ENDOBRONCHIAL ULTRASOUND   PROCEDURE DATE: 11/27/2016  TIME:  NAMEIMANOL Gross  DOB:12-18-1933  MRN: [id number] LOC:  ARPO/None    HOSP DAY: '@LENGTHOFSTAYDAYS'$ @ CODE STATUS:   Code Status History    This patient does not have a recorded code status. Please follow your organizational policy for patients in this situation.          Indications/Preliminary Diagnosis:   Consent: (Place X beside choice/s below)  The benefits, risks and possible complications of the procedure were        explained to:  _x__ patient  _x__ patient's family  ___ other:___________  who verbalized understanding and gave:  ___ verbal  ___ written  _x__ verbal and written  ___ telephone  ___ other:________ consent.      Unable to obtain consent; procedure performed on emergent basis.     Other:       PRESEDATION ASSESSMENT: History and Physical has been performed. Patient meds and allergies have been reviewed. Presedation airway examination has been performed and documented. Baseline vital signs, sedation score, oxygenation status, and cardiac rhythm were reviewed. Patient was deemed to be in satisfactory condition to undergo the procedure.    PREMEDICATIONS: SEE ANESTHESIOLOGY RECORDS   Airway Prep (Place X beside choice below)   1% Transtracheal Lidocaine Anesthetization 7 cc   Patient prepped per Bronchoscopy Lab Policy       Insertion Route (Place X beside choice below)   Nasal   Oral  x Endotracheal Tube   Tracheostomy   INTRAPROCEDURE MEDICATIONS: SEE ANESTHESIOLOGY RECORDS   PROCEDURE DETAILS: Timeout performed and correct patient, name, & ID confirmed. Following prep per Pulmonary policy, appropriate sedation was administered.  Airway exam proceeded with findings, technical procedures, and specimen collection as noted below. At the end of exam the scope was withdrawn without incident. Impression and Plan as noted below.  I was able to visualize area of LN approx 1x1 cm  surrounded by vasculature, I attempted to obtain tissue sampling as listed below     TECHNICAL PROCEDURES: (Place X beside choice below)   Procedures  Description    None     Electrocautery     Cryotherapy     Balloon Dilatation     Bronchography     Stent Placement     Therapeutic Aspiration     Laser/Argon Plasma    Brachytherapy Catheter Placement    Foreign Body Removal     SPECIMENS (Sites): (Place X beside choice below)  Specimens Description   No Specimens Obtained     Washings    Lavage    Biopsies   x Fine Needle Aspirates 3   Brushings    Sputum    FINDINGS: 1 x 1 cm LN ESTIMATED BLOOD LOSS: none COMPLICATIONS/RESOLUTION: none       IMPRESSION:POST-PROCEDURE DX: adenopathy    RECOMMENDATION/PLAN:  Follow up path reports    Corrin Parker, M.D.  [facility]  Medical Director [place] Director [employer] Department

## 2016-11-27 NOTE — Anesthesia Preprocedure Evaluation (Signed)
Anesthesia Evaluation  Patient identified by MRN, date of birth, ID band Patient awake    Reviewed: Allergy & Precautions, H&P , NPO status , Patient's Chart, lab work & pertinent test results, reviewed documented beta blocker date and time   Airway Mallampati: III  TM Distance: <3 FB Neck ROM: full    Dental  (+) Teeth Intact   Pulmonary neg pulmonary ROS, former smoker,    Pulmonary exam normal        Cardiovascular hypertension, + angina with exertion + CAD and + Past MI  negative cardio ROS Normal cardiovascular exam Rhythm:regular Rate:Normal  Occasional angina with inc. Activity but pt currently walks 3 miles per day on multiple days without recurrence of chest pain.     Neuro/Psych negative neurological ROS  negative psych ROS   GI/Hepatic negative GI ROS, Neg liver ROS,   Endo/Other  negative endocrine ROS  Renal/GU negative Renal ROS  negative genitourinary   Musculoskeletal   Abdominal   Peds  Hematology negative hematology ROS (+)   Anesthesia Other Findings Past Medical History: No date: Arthritis Nov. 12, 2012: CAD (coronary artery disease)     Comment: Inferior ST elevation MI in November of 2012               with ventricular fibrillation arrest. Status               post drug-eluting stent placement to the mid               LAD. Most recent cardiac catheterization in               June of this year showed patent stent with               minimal restenosis, 75% proximal LAD and 80%               distal LAD which were unchanged from before.               Mild disease in the left circumflex and               moderate RCA disease. Ejection fraction was               55%. Nov. 2012: Cardiac arrest [facility])     Comment: x 2  No date: Cardiac arrest - ventricular fibrillation No date: HOH (hard of hearing)     Comment: Left Hearing Aid No date: Hyperlipidemia No date: Hypertension 2013: Post PTCA   Comment: x2 08/26/2011: ST elevation MI (STEMI) [facility]) Past Surgical History: 2013: CARDIAC CATHETERIZATION     Comment: @ [place]; [place] s/p stent  No date: CORONARY ANGIOPLASTY BMI    Body Mass Index:  21.24 kg/m     Reproductive/Obstetrics negative OB ROS                             Anesthesia Physical Anesthesia Plan  ASA: III  Anesthesia Plan: General ETT   Post-op Pain Management:    Induction:   Airway Management Planned: Video Laryngoscope Planned  Additional Equipment:   Intra-op Plan:   Post-operative Plan:   Informed Consent: I have reviewed the patients History and Physical, chart, labs and discussed the procedure including the risks, benefits and alternatives for the proposed anesthesia with the patient or authorized representative who has indicated his/her understanding and acceptance.   Dental Advisory Given  Plan Discussed with: CRNA  Anesthesia Plan Comments:         Anesthesia Quick Evaluation

## 2016-11-27 NOTE — H&P (View-Only) (Signed)
[place]  [phone] [phone]  ID: Scott Gross OB: 05-Oct-1934  MR#: [id number]  FWY#:[id number]  Patient Care Team: [name], MD as PCP - General (Family Medicine)  CHIEF COMPLAINT:  Left upper lobe pulmonary nodule.  INTERVAL HISTORY: Patient returns to clinic today for further evaluation and discussion of his PET scan results. He recently met with Dr. Mortimer Fries, but has yet to undergo biopsy. He is anxious, but otherwise feels well. He has no neurologic complaints. He denies any chest pain, cough, or shortness of breath today. He has a good appetite and denies weight loss. He denies any recent fevers or illnesses. He has no nausea, vomiting, constipation, or diarrhea. He has no urinary complaints. Patient offers no further specific complaints.  REVIEW OF SYSTEMS:   Review of Systems  Constitutional: Negative.  Negative for fever, malaise/fatigue and weight loss.  Respiratory: Negative for cough, hemoptysis and shortness of breath.   Cardiovascular: Negative.  Negative for chest pain and leg swelling.  Gastrointestinal: Negative.  Negative for abdominal pain.  Genitourinary: Negative.   Musculoskeletal: Positive for joint pain.  Neurological: Negative.  Negative for weakness.  Psychiatric/Behavioral: The patient is nervous/anxious.     As per HPI. Otherwise, a complete review of systems is negative.  PAST MEDICAL HISTORY: Past Medical History:  Diagnosis Date  . CAD (coronary artery disease) Nov. 12, 2012   Inferior ST elevation MI in November of 2012 with ventricular fibrillation arrest. Status post drug-eluting stent placement to the mid LAD. Most recent cardiac catheterization in June of this year showed patent stent with minimal restenosis, 75% proximal LAD and 80% distal LAD which were unchanged from before. Mild disease in the left circumflex and moderate RCA disease. Ejection fraction was 55%.  . Cardiac arrest [facility]) Nov. 2012     x 2   . Cardiac arrest - ventricular fibrillation   . Diabetes mellitus   . Hyperlipidemia   . Hypertension   . Post PTCA 2013   x2  . ST elevation MI (STEMI) ([place])     PAST SURGICAL HISTORY: Past Surgical History:  Procedure Laterality Date  . CARDIAC CATHETERIZATION  2013   @ [place]; [facility] s/p stent     FAMILY HISTORY: Family History  Problem Relation Age of Onset  . Heart attack Mother   . Heart attack Father     ADVANCED DIRECTIVES (Y/N):  N  HEALTH MAINTENANCE: Social History  Substance Use Topics  . Smoking status: Former Research scientist (life sciences)  . Smokeless tobacco: Never Used  . Alcohol use No     Colonoscopy:  PAP:  Bone density:  Lipid panel:  No Known Allergies  Current Outpatient Prescriptions  Medication Sig Dispense Refill  . aspirin 81 MG tablet Take 81 mg by mouth daily.    Marland Kitchen lisinopril (PRINIVIL,ZESTRIL) 10 MG tablet Take 10 mg by mouth daily.     . metoprolol tartrate (LOPRESSOR) 12.5 mg TABS Take 12.5 mg by mouth 2 (two) times daily.      No current facility-administered medications for this visit.     OBJECTIVE: Vitals:   10/30/16 1522 10/30/16 1529  BP: (!) 198/99 (!) 187/104  Pulse: 67   Resp: 18   Temp: (!) 96.9 F (36.1 C)      Body mass index is 20.21 kg/m.    ECOG FS:1 - Symptomatic but completely ambulatory  General: Well-developed, well-nourished, no acute distress. Eyes: Pink conjunctiva, anicteric sclera. Lungs: Clear to auscultation bilaterally. Heart: Regular rate and rhythm.  No rubs, murmurs, or gallops. Abdomen: Soft, nontender, nondistended. No organomegaly noted, normoactive bowel sounds. Musculoskeletal: No edema, cyanosis, or clubbing. Neuro: Alert, answering all questions appropriately. Cranial nerves grossly intact. Skin: No rashes or petechiae noted. Psych: Normal affect.   LAB RESULTS:  Lab Results  Component Value Date   NA 137 09/03/2016   K 3.2 (L) 09/03/2016   CL 101 09/03/2016   CO2 29 09/03/2016    GLUCOSE 179 (H) 09/03/2016   BUN 11 09/03/2016   CREATININE 1.00 09/03/2016   CALCIUM 8.8 (L) 09/03/2016   PROT 7.4 11/28/2013   ALBUMIN 3.9 11/28/2013   AST 33 11/28/2013   ALT 36 11/28/2013   ALKPHOS 99 11/28/2013   BILITOT 0.6 11/28/2013   GFRNONAA >60 09/03/2016   GFRAA >60 09/03/2016    Lab Results  Component Value Date   WBC 17.3 (H) 09/03/2016   NEUTROABS 6.0 11/28/2013   HGB 16.1 09/03/2016   HCT 46.7 09/03/2016   MCV 89.6 09/03/2016   PLT 167 09/03/2016     STUDIES: No results found.  ASSESSMENT: Left upper lobe pulmonary nodule  PLAN:   1.  Left upper lobe pulmonary nodule: PET scan results reviewed independently highly suspicious for underlying malignancy. Case was previously discussed at cancer conference, and after input from interventional radiology and pulmonology it was determined that bronchoscopy/EBUS would likely be the best way to obtain a tissue sample for diagnosis. Patient has been evaluated by Dr. Stoney Bang and at that time was unclear if he wanted to pursue biopsy. After lengthy discussion with his wife and daughter present, he has decided that he wishes to pursue biopsy. Given his advanced age, would not recommend surgery or chemotherapy, but radiation therapy using SBRT may offer some benefit. Return to clinic one week after the biopsy for further evaluation and treatment planning.   2. Hypertension: Patient's blood pressure is significantly elevated today. Continue evaluation and treatment for primary care.  Approximately 30 minutes was spent in discussion of which greater than 50% was consultation.  Patient expressed understanding and was in agreement with this plan. He also understands that He can call clinic at any time with any questions, concerns, or complaints.   No matching staging information was found for the patient.  [name], MD   10/30/2016 5:08 PM

## 2016-11-27 NOTE — Discharge Instructions (Addendum)
Flexible Bronchoscopy, Care After These instructions give you information on caring for yourself after your procedure. Your doctor may also give you more specific instructions. Call your doctor if you have any problems or questions after your procedure. Follow these instructions at home:  Do not eat or drink anything for 2 hours after your procedure. If you try to eat or drink before the medicine wears off, food or drink could go into your lungs. You could also burn yourself.  After 2 hours have passed and when you can cough and gag normally, you may eat soft food and drink liquids slowly.  The day after the test, you may eat your normal diet.  You may do your normal activities.  Keep all doctor visits. Get help right away if:  You get more and more short of breath.  You get light-headed.  You feel like you are going to pass out (faint).  You have chest pain.  You have new problems that worry you.  You cough up more than a little blood.  You cough up more blood than before. This information is not intended to replace advice given to you by your health care provider. Make sure you discuss any questions you have with your health care provider. Document Released: 07/29/2009 Document Revised: 03/08/2016 Document Reviewed: 06/05/2013 Elsevier Interactive Patient Education  2017 [employer]   1) The drugs that you were given will stay in your system until tomorrow so for the next 24 hours you should not:  A) Drive an automobile B) Make any legal decisions C) Drink any alcoholic beverage   2) You may resume regular meals tomorrow.  Today it is better to start with liquids and gradually work up to solid foods.  You may eat anything you prefer, but it is better to start with liquids, then soup and crackers, and gradually work up to solid foods.   3) Please notify your doctor immediately if you have any unusual bleeding, trouble  breathing, redness and pain at the surgery site, drainage, fever, or pain not relieved by medication.    4) Additional Instructions:        Please contact your physician with any problems or Same Day Surgery at [phone], Monday through Friday 6 am to 4 pm, or [place] at [facility] number at [phone].

## 2016-11-27 NOTE — Op Note (Signed)
Electromagnetic Navigation Bronchoscopy: Indication:lung mass  Preoperative Diagnosis:lung mass Post Procedure Diagnosis: lung mass Consent: verbal/written Risks and benefits explained in detail including risk of infection, bleeding, respiratory failure and death.   Hand washing performed prior to starting the procedure.   Type of Anesthesia: see Anesthesiology records .   Procedure Performed:  Virtual Bronchoscopy with Multi-planar Image analysis, 3-D reconstruction of coronal, sagittal and multi-planar images for the purposes of planning real-time bronchoscopy using the iLogic Electromagnetic Navigation Bronchoscopy System (superDimension)..   Description of Procedure: After obtaining informed consent from the patient, the above sedative and anesthetic measures were carried out, flexible fiberoptic bronchoscope was inserted via an oral bite block. Posterior pharynx was clear. The 2 vocal cords were easily traversed after application of local anesthetic.  The virtual camera was then placed into the central portion of the trachea. The trachea itself was inspected.  The main carina, right and left midstem bronchus and all the segmental and subsegmental airways by virtual bronchoscopy were brieftly inspected.  The camera was directed to standard registration points at the following centers: main carina, right upper lobe bronchus, right lower lobe bronchus, right middle lobe bronchus, left upper lobe bronchus, and the left lower lobe bronchus. This data was transferred to the i-Logic ENB system for real-time bronchoscopy.    After navigating the scope into LUL apical segment, I proceeded to obtain tissue samples as listed below. Upon completeion of obtaining tissues samples, I then used Super D software to track several areas around the lung mass and using the guidance system I was able to place 3 Gold Fiducial Markers around the lung mass approx 2-4 cm from Central location of the mass into different  subsegments of the lung. The fiducial's were deployed without any comlplications.   Specimans Obtained:  Fine Needle Aspirations 21G times:2  Forceps Biopsy times:6    Fluoroscopy:  Fluoroscopy was utilized during the course of this procedure to assure that biopsies were taken in a safe manner under fluoroscopic guidance with no spot films required.   Complications:none  Estimated Blood Loss: none  Monitoring:  The patient was monitored with continuous oximetry and received supplemental nasal cannula oxygen throughout the procedure. In addition, serial blood pressure measurements and continuous electrocardiography showed these physiologic parameters to remain tolerable throughout the procedure.   Assessment and Plan/Additional Comments:follow up path reports    Corrin Parker, M.D.  [facility]  Medical Director [place] Director [facility] Cardio-Pulmonary Department

## 2016-11-27 NOTE — Transfer of Care (Signed)
Immediate Anesthesia Transfer of Care Note  Patient: Scott Gross  Procedure(s) Performed: Procedure(s): ELECTROMAGNETIC NAVIGATION BRONCHOSCOPY (N/A)  Patient Location: PACU  Anesthesia Type:General  Level of Consciousness: sedated and responds to stimulation  Airway & Oxygen Therapy: Patient Spontanous Breathing and Patient connected to face mask oxygen  Post-op Assessment: Report given to RN and Post -op Vital signs reviewed and stable  Post vital signs: Reviewed and stable  Last Vitals:  Vitals:   11/27/16 1126 11/27/16 1422  BP: (!) 182/104 (!) 119/106  Pulse:  68  Resp:  17  Temp:      Last Pain:  Vitals:   11/27/16 1016  TempSrc: Oral         Complications: No apparent anesthesia complications

## 2016-11-27 NOTE — OR Nursing (Signed)
Versed '2mg'$  IV given for c/o anxiety per Dr. Baruch Merl order

## 2016-11-27 NOTE — Anesthesia Post-op Follow-up Note (Cosign Needed)
Anesthesia QCDR form completed.        

## 2016-11-27 NOTE — Anesthesia Procedure Notes (Addendum)
Procedure Name: Intubation Performed by: Lance Muss Pre-anesthesia Checklist: Patient identified, Patient being monitored, Timeout performed, Emergency Drugs available and Suction available Patient Re-evaluated:Patient Re-evaluated prior to inductionOxygen Delivery Method: Circle system utilized Preoxygenation: Pre-oxygenation with 100% oxygen Intubation Type: IV induction Ventilation: Mask ventilation without difficulty Laryngoscope Size: 3 and Glidescope Grade View: Grade IV Tube type: Oral Tube size: 8.5 mm Number of attempts: 2 Airway Equipment and Method: Stylet and Video-laryngoscopy Placement Confirmation: ETT inserted through vocal cords under direct vision,  positive ETCO2 and breath sounds checked- equal and bilateral Secured at: 22 cm Tube secured with: Tape Dental Injury: Teeth and Oropharynx as per pre-operative assessment  Difficulty Due To: Difficulty was anticipated and Difficult Airway- due to anterior larynx Future Recommendations: Recommend- induction with short-acting agent, and alternative techniques readily available Comments: First attempt with MAC 3 blade, grade 4 view. Second attempt with Mcgrath 3, grade 2 view. Anterior airway, recommend using video-laryngoscopy first

## 2016-11-28 ENCOUNTER — Encounter: Payer: Self-pay | Admitting: Internal Medicine

## 2016-11-29 ENCOUNTER — Telehealth: Payer: Self-pay | Admitting: *Deleted

## 2016-11-29 MED ORDER — ALBUTEROL SULFATE HFA 108 (90 BASE) MCG/ACT IN AERS: 2.0000 | INHALATION_SPRAY | RESPIRATORY_TRACT | 1 refills | Status: DC | PRN

## 2016-11-29 NOTE — Telephone Encounter (Signed)
RX sent. Wife informed. Nothing further needed.

## 2016-11-29 NOTE — Telephone Encounter (Signed)
Albuterol 2-4 puffs every 4 hrs as needed 

## 2016-11-29 NOTE — Telephone Encounter (Signed)
Asking if he needs to see Dr Grayland Ormond once the biopsy results come back or if he can just see Dr Baruch Gouty and get started on treatment. He currently does not have any follow up here and the path report is not back yet. She also requests that  All communication over the phone be through her as her parents are elder and can get confused

## 2016-11-29 NOTE — Telephone Encounter (Signed)
I do not have results yet, but he probably should see both Dr. Baruch Gouty and myself. I would likely want a repeat PET scan prior to any treatment.

## 2016-11-29 NOTE — Anesthesia Postprocedure Evaluation (Signed)
Anesthesia Post Note  Patient: Scott Gross  Procedure(s) Performed: Procedure(s) (LRB): ELECTROMAGNETIC NAVIGATION BRONCHOSCOPY (N/A)  Patient location during evaluation: PACU Anesthesia Type: General Level of consciousness: awake and alert Pain management: pain level controlled Vital Signs Assessment: post-procedure vital signs reviewed and stable Respiratory status: spontaneous breathing, nonlabored ventilation, respiratory function stable and patient connected to nasal cannula oxygen Cardiovascular status: blood pressure returned to baseline and stable Postop Assessment: no signs of nausea or vomiting Anesthetic complications: no     Last Vitals:  Vitals:   11/27/16 1500 11/27/16 1532  BP: (!) 179/98 (!) 186/102  Pulse: 68 69  Resp: 16 16  Temp: 36.4 C     Last Pain:  Vitals:   11/27/16 1532  TempSrc:   PainSc: 0-No pain                 Molli Barrows

## 2016-11-29 NOTE — Telephone Encounter (Signed)
Wife has left message stating per d/c papers after procedure that if you have any of the symptoms listed to contact your doctor immediately. She states pt has some congestion and wants to know if you will call something in.

## 2016-11-29 NOTE — Telephone Encounter (Signed)
She is questioning why he would need another PET Scan since he just had one in December and he just had a CT repeated on 1/31. Please explain. She said if it is definitely needed, she is fine with it, but states nothing was mentioned at the 1/16 office visit about repeating the PET

## 2016-11-30 LAB — CYTOLOGY - NON PAP

## 2016-12-03 ENCOUNTER — Other Ambulatory Visit: Payer: Self-pay | Admitting: Oncology

## 2016-12-03 LAB — SURGICAL PATHOLOGY

## 2016-12-03 NOTE — Telephone Encounter (Signed)
I have placed this case on tumor board on Thursday, once his discussed by all the physicians we will call him as to when to follow-up.

## 2016-12-03 NOTE — Telephone Encounter (Signed)
Results are back from biopsy. Please advise on how to schedule patient for follow up. Thanks.

## 2016-12-05 ENCOUNTER — Telehealth: Payer: Self-pay | Admitting: *Deleted

## 2016-12-05 NOTE — Telephone Encounter (Signed)
Wife is wanting to know about bronch results and would like to get a copy once you have received them. Thanks.

## 2016-12-05 NOTE — Telephone Encounter (Signed)
Pt to be discussed at case conference tomorrow. Follow up will be discussed at that time. Will let pt know by the end of the week regarding follow up.

## 2016-12-05 NOTE — Telephone Encounter (Signed)
Asking for results and about a FU appt with Dr Grayland Ormond and Dr Curly Shores advise

## 2016-12-05 NOTE — Telephone Encounter (Signed)
Left message on vm of plan

## 2016-12-06 ENCOUNTER — Telehealth: Payer: Self-pay | Admitting: *Deleted

## 2016-12-06 ENCOUNTER — Other Ambulatory Visit: Payer: Self-pay | Admitting: *Deleted

## 2016-12-06 ENCOUNTER — Inpatient Hospital Stay: Payer: Medicare HMO | Attending: Oncology

## 2016-12-06 DIAGNOSIS — R918 Other nonspecific abnormal finding of lung field: Secondary | ICD-10-CM

## 2016-12-06 NOTE — Telephone Encounter (Signed)
Called pt's daughter, Jacqlyn Larsen, to inform her that her dad was discussed at case conference today. It is planned for pt to see Dr. Genevive Bi tomorrow with PFT's to evaluate pt for mediastinoscopy. Jacqlyn Larsen was informed of this and she stated that she would rather like to discuss all her dad's options with Dr. Grayland Ormond at an appointment. Jacqlyn Larsen stated that her dad is only interested in receiving radiation for treatment at this time. Appt given for 3/1 at 3:30pm. Becky verbalized understanding.

## 2016-12-13 ENCOUNTER — Inpatient Hospital Stay: Payer: Medicare HMO | Attending: Oncology | Admitting: Oncology

## 2016-12-13 VITALS — BP 175/109 | HR 64 | Temp 96.5°F | Resp 18 | Wt 144.0 lb

## 2016-12-13 DIAGNOSIS — R911 Solitary pulmonary nodule: Secondary | ICD-10-CM | POA: Insufficient documentation

## 2016-12-13 DIAGNOSIS — Z79899 Other long term (current) drug therapy: Secondary | ICD-10-CM | POA: Insufficient documentation

## 2016-12-13 DIAGNOSIS — I252 Old myocardial infarction: Secondary | ICD-10-CM | POA: Diagnosis not present

## 2016-12-13 DIAGNOSIS — E785 Hyperlipidemia, unspecified: Secondary | ICD-10-CM | POA: Diagnosis not present

## 2016-12-13 DIAGNOSIS — F419 Anxiety disorder, unspecified: Secondary | ICD-10-CM | POA: Diagnosis not present

## 2016-12-13 DIAGNOSIS — Z87891 Personal history of nicotine dependence: Secondary | ICD-10-CM | POA: Diagnosis not present

## 2016-12-13 DIAGNOSIS — I10 Essential (primary) hypertension: Secondary | ICD-10-CM | POA: Insufficient documentation

## 2016-12-13 DIAGNOSIS — I2510 Atherosclerotic heart disease of native coronary artery without angina pectoris: Secondary | ICD-10-CM | POA: Insufficient documentation

## 2016-12-13 DIAGNOSIS — Z7982 Long term (current) use of aspirin: Secondary | ICD-10-CM | POA: Diagnosis not present

## 2016-12-13 DIAGNOSIS — R918 Other nonspecific abnormal finding of lung field: Secondary | ICD-10-CM

## 2016-12-13 NOTE — Progress Notes (Signed)
Patient here for results today.

## 2016-12-14 ENCOUNTER — Telehealth: Payer: Self-pay | Admitting: *Deleted

## 2016-12-14 ENCOUNTER — Other Ambulatory Visit: Payer: Self-pay | Admitting: *Deleted

## 2016-12-14 DIAGNOSIS — R918 Other nonspecific abnormal finding of lung field: Secondary | ICD-10-CM

## 2016-12-14 NOTE — Telephone Encounter (Signed)
Spoke with patient's daughter to give appt details. Pt will see Dr. Genevive Bi on 12/21/16 at 9:00am. Address and phone number for Dr. Genevive Bi' office were given to pt's daughter, Jacqlyn Larsen. Also informed Jacqlyn Larsen that pt will be scheduled for PFT's prior to his appt with Dr. Genevive Bi. Becky verbalized understanding and confirmed appt with Dr. Genevive Bi on 12/21/16 at [employer].

## 2016-12-17 DIAGNOSIS — E119 Type 2 diabetes mellitus without complications: Secondary | ICD-10-CM | POA: Insufficient documentation

## 2016-12-17 DIAGNOSIS — I209 Angina pectoris, unspecified: Secondary | ICD-10-CM | POA: Insufficient documentation

## 2016-12-17 NOTE — Progress Notes (Signed)
[place]  [phone]) [phone] Fax:(336) [phone]  ID: Scott Gross OB: 1934/05/16  MR#: [id number]  AOZ#:[id number]  Patient Care Team: [name], MD as PCP - General (Family Medicine)  CHIEF COMPLAINT:  Left upper lobe pulmonary nodule.  INTERVAL HISTORY: Patient returns to clinic today for further evaluation and discussion of his biopsy results. Biopsy of his left upper lobe pulmonary nodule did not reveal an invasive component and biopsy of hisediastinal lymph nodes was insufficient material. It was recommended that patient pursue mediastinoscopy with lymph node removal, the patient returns to clinic to further discuss his options. He currently feels well and is asymptomatic. He has no neurologic complaints. He denies any chest pain, cough, or shortness of breath today. He has a good appetite and denies weight loss. He denies any recent fevers or illnesses. He has no nausea, vomiting, constipation, or diarrhea. He has no urinary complaints. Patient offers no further specific complaints.  REVIEW OF SYSTEMS:   Review of Systems  Constitutional: Negative.  Negative for fever, malaise/fatigue and weight loss.  Respiratory: Negative for cough, hemoptysis and shortness of breath.   Cardiovascular: Negative.  Negative for chest pain and leg swelling.  Gastrointestinal: Negative.  Negative for abdominal pain.  Genitourinary: Negative.   Musculoskeletal: Positive for joint pain.  Neurological: Negative.  Negative for weakness.  Psychiatric/Behavioral: The patient is nervous/anxious.     As per HPI. Otherwise, a complete review of systems is negative.  PAST MEDICAL HISTORY: Past Medical History:  Diagnosis Date  . Arthritis   . CAD (coronary artery disease) Nov. 12, 2012   Inferior ST elevation MI in November of 2012 with ventricular fibrillation arrest. Status post drug-eluting stent placement to the mid LAD. Most recent cardiac catheterization in June of  this year showed patent stent with minimal restenosis, 75% proximal LAD and 80% distal LAD which were unchanged from before. Mild disease in the left circumflex and moderate RCA disease. Ejection fraction was 55%.  . Cardiac arrest [facility]) Nov. 2012   x 2   . Cardiac arrest - ventricular fibrillation   . HOH (hard of hearing)    Left Hearing Aid  . Hyperlipidemia   . Hypertension   . Post PTCA 2013   x2  . ST elevation MI (STEMI) ([place]) 08/26/2011    PAST SURGICAL HISTORY: Past Surgical History:  Procedure Laterality Date  . CARDIAC CATHETERIZATION  2013   @ [place]; [facility] s/p stent   . CORONARY ANGIOPLASTY    . ELECTROMAGNETIC NAVIGATION BROCHOSCOPY N/A 11/27/2016   Procedure: ELECTROMAGNETIC NAVIGATION BRONCHOSCOPY;  Surgeon: [name], MD;  Location: [facility] ORS;  Service: Cardiopulmonary;  Laterality: N/A;    FAMILY HISTORY: Family History  Problem Relation Age of Onset  . Heart attack Mother   . Heart attack Father     ADVANCED DIRECTIVES (Y/N):  N  HEALTH MAINTENANCE: Social History  Substance Use Topics  . Smoking status: Former Research scientist (life sciences)  . Smokeless tobacco: Never Used  . Alcohol use No     Colonoscopy:  PAP:  Bone density:  Lipid panel:  No Known Allergies  Current Outpatient Prescriptions  Medication Sig Dispense Refill  . amLODipine (NORVASC) 5 MG tablet Take 5 mg by mouth as needed. For systolic pressure greater than 150    . aspirin 81 MG tablet Take 81 mg by mouth daily. Patient stopped on November 22, 2016 for procedure    . lisinopril (PRINIVIL,ZESTRIL) 20 MG tablet Take 20 mg by mouth daily.    Marland Kitchen  metoprolol tartrate (LOPRESSOR) 25 MG tablet Take 25 mg by mouth 2 (two) times daily.    Marland Kitchen albuterol (PROVENTIL HFA;VENTOLIN HFA) 108 (90 Base) MCG/ACT inhaler Inhale 2 puffs into the lungs every 4 (four) hours as needed for wheezing or shortness of breath. (Patient not taking: Reported on 12/13/2016) 1 Inhaler 1   No current facility-administered medications for  this visit.     OBJECTIVE: Vitals:   12/13/16 1552  BP: (!) 175/109  Pulse: 64  Resp: 18  Temp: (!) 96.5 F (35.8 C)     Body mass index is 20.66 kg/m.    ECOG FS:1 - Symptomatic but completely ambulatory  General: Well-developed, well-nourished, no acute distress. Eyes: Pink conjunctiva, anicteric sclera. Lungs: Clear to auscultation bilaterally. Heart: Regular rate and rhythm. No rubs, murmurs, or gallops. Abdomen: Soft, nontender, nondistended. No organomegaly noted, normoactive bowel sounds. Musculoskeletal: No edema, cyanosis, or clubbing. Neuro: Alert, answering all questions appropriately. Cranial nerves grossly intact. Skin: No rashes or petechiae noted. Psych: Normal affect.   LAB RESULTS:  Lab Results  Component Value Date   NA 137 09/03/2016   K 3.9 11/23/2016   CL 101 09/03/2016   CO2 29 09/03/2016   GLUCOSE 179 (H) 09/03/2016   BUN 11 09/03/2016   CREATININE 1.00 11/14/2016   CALCIUM 8.8 (L) 09/03/2016   PROT 7.4 11/28/2013   ALBUMIN 3.9 11/28/2013   AST 33 11/28/2013   ALT 36 11/28/2013   ALKPHOS 99 11/28/2013   BILITOT 0.6 11/28/2013   GFRNONAA >60 09/03/2016   GFRAA >60 09/03/2016    Lab Results  Component Value Date   WBC 17.3 (H) 09/03/2016   NEUTROABS 6.0 11/28/2013   HGB 16.1 09/03/2016   HCT 46.7 09/03/2016   MCV 89.6 09/03/2016   PLT 167 09/03/2016     STUDIES: Dg C-arm 1-60 Min-no Report  Result Date: 11/27/2016 Fluoroscopy was utilized by the requesting physician.  No radiographic interpretation.    ASSESSMENT: Left upper lobe pulmonary nodule  PLAN:   1.  Left upper lobe pulmonary nodule: PET scan results reviewed independently highly suspicious for underlying malignancy. Biopsy of lung nodule revealed adenocarcinoma, but there was no invasive component on the sample. Biopsy of mediastinal lymph node had insufficient material. It was recommended at cancer conference the patient pursue mediastinoscopy with removal of his  lymph node to ensure accurate staging and therefore treatment of patient's presumed malignancy. After lengthy discussion with the patient, he agreed to have consultation with Dr. Genevive Bi for further discussion of this procedure. Patient is not a surgical candidate. If lymph node biopsy is negative, he would be considered a stage I and will proceed with XRT only. If lymph node biopsy were positive, this would increase his stage III and he may be a candidate for concurrent XRT and chemotherapy. Return to clinic one week after his biopsy to discuss the results and treatment planning.  2. Hypertension: Patient's blood pressure is significantly elevated today. Continue evaluation and treatment for primary care.  Approximately 30 minutes was spent in discussion of which greater than 50% was consultation.  Patient expressed understanding and was in agreement with this plan. He also understands that He can call clinic at any time with any questions, concerns, or complaints.   Cancer Staging No matching staging information was found for the patient.  [name], MD   12/17/2016 4:08 PM

## 2016-12-20 ENCOUNTER — Ambulatory Visit: Payer: Medicare HMO | Attending: Oncology

## 2016-12-20 DIAGNOSIS — J449 Chronic obstructive pulmonary disease, unspecified: Secondary | ICD-10-CM | POA: Diagnosis not present

## 2016-12-20 DIAGNOSIS — R918 Other nonspecific abnormal finding of lung field: Secondary | ICD-10-CM | POA: Diagnosis not present

## 2016-12-21 ENCOUNTER — Ambulatory Visit (INDEPENDENT_AMBULATORY_CARE_PROVIDER_SITE_OTHER): Payer: Medicare HMO | Admitting: Cardiothoracic Surgery

## 2016-12-21 ENCOUNTER — Encounter: Payer: Self-pay | Admitting: Cardiothoracic Surgery

## 2016-12-21 VITALS — BP 180/112 | HR 61 | Temp 97.6°F | Resp 22 | Ht 70.0 in | Wt 145.2 lb

## 2016-12-21 DIAGNOSIS — I469 Cardiac arrest, cause unspecified: Secondary | ICD-10-CM

## 2016-12-21 DIAGNOSIS — R918 Other nonspecific abnormal finding of lung field: Secondary | ICD-10-CM

## 2016-12-21 NOTE — Patient Instructions (Signed)
You will referred to see Dr. Baruch Gouty (Radiation-Pncologist) and he will be able to discuss radiation therapy.

## 2016-12-21 NOTE — Progress Notes (Signed)
Patient ID: Scott Gross, male   DOB: Feb 13, 1934, 83 y.o.   MRN: [id number]  Chief Complaint  Patient presents with  . New Patient (Initial Visit)    Lung Mass    Referred By Dr. Grayland Ormond Reason for Referral left upper lobe mass  HPI Location, Quality, Duration, Severity, Timing, Context, Modifying Factors, Associated Signs and Symptoms.  Scott Gross is a 83 y.o. male.  This patient was in his usual state of health until several months ago when he was walking through the house in the evening when he fell. His wife her fall and states that he appeared to be quite short of breath and was profusely sweating. She called the ambulance and he was taken to the emergency department where chest CT was performed. This revealed a left upper lobe mass with bilateral hilar and mediastinal lymph nodes that were not felt to be enlarged. He was ultimately seen where a repeat CT scan and PET scan were performed. The PET scan revealed intense uptake in the left upper lobe mass and also revealed some mild uptake in both hila and mediastinum. He underwent endobronchial biopsy of the mediastinal lymph node and the left upper lobe mass. The biopsy of the mediastinal lymph node was negative for malignancy and the biopsy of the left upper lobe mass was positive for adenocarcinoma. The patient is sent here for consideration of ongoing therapy.  He was a smoker in the past but quit many years ago. He smoked for approximately 40 pack years. He states he's able to do all the activities of daily living without significant limitations. He does not get short of breath routinely. He's had no hemoptysis, fever or chills. He did have a set of pulmonary function studies done which were essentially normal. He was seen by Dr. Grayland Ormond who sent the patient on to me. There still question as to his clinical stage as the PET scan did suggest low-level uptake in both the mediastinum and the hila bilaterally.   Past Medical History:   Diagnosis Date  . Arthritis   . CAD (coronary artery disease) Nov. 12, 2012   Inferior ST elevation MI in November of 2012 with ventricular fibrillation arrest. Status post drug-eluting stent placement to the mid LAD. Most recent cardiac catheterization in June of this year showed patent stent with minimal restenosis, 75% proximal LAD and 80% distal LAD which were unchanged from before. Mild disease in the left circumflex and moderate RCA disease. Ejection fraction was 55%.  . Cardiac arrest [facility]) Nov. 2012   x 2   . Cardiac arrest - ventricular fibrillation   . Diabetes mellitus without complication ([place])    Diet Controlled  . HOH (hard of hearing)    Left Hearing Aid  . Hyperlipidemia   . Hypertension   . Post PTCA 2013   x2  . ST elevation MI (STEMI) ([place]) 08/26/2011    Past Surgical History:  Procedure Laterality Date  . CARDIAC CATHETERIZATION  2013   @ [place]; [facility] s/p stent   . CORONARY ANGIOPLASTY    . ELECTROMAGNETIC NAVIGATION BROCHOSCOPY N/A 11/27/2016   Procedure: ELECTROMAGNETIC NAVIGATION BRONCHOSCOPY;  Surgeon: [name], MD;  Location: [facility] ORS;  Service: Cardiopulmonary;  Laterality: N/A;    Family History  Problem Relation Age of Onset  . Heart attack Mother   . Heart attack Father     Social History Social History  Substance Use Topics  . Smoking status: Former Smoker    Packs/day: 1.00  Types: Cigarettes    Quit date: 08/27/2011  . Smokeless tobacco: Never Used  . Alcohol use No    Allergies  Allergen Reactions  . Statins Other (See Comments)    Arthralagia    Current Outpatient Prescriptions  Medication Sig Dispense Refill  . amLODipine (NORVASC) 5 MG tablet Take 5 mg by mouth as needed. For systolic pressure greater than 150    . aspirin 81 MG tablet Take 81 mg by mouth daily. Patient stopped on November 22, 2016 for procedure    . lisinopril (PRINIVIL,ZESTRIL) 20 MG tablet Take 20 mg by mouth daily.    . metoprolol tartrate  (LOPRESSOR) 25 MG tablet Take 25 mg by mouth 2 (two) times daily.     No current facility-administered medications for this visit.       Review of Systems A complete review of systems was asked and was negative except for the following positive findingsHearing loss, shortness of breath with exertion  Blood pressure (!) 180/112, pulse 61, temperature 97.6 F (36.4 C), temperature source Oral, resp. rate (!) 22, height '5\' 10"'$  (1.778 m), weight 145 lb 3.2 oz (65.9 kg), SpO2 98 %.  Physical Exam CONSTITUTIONAL:  Pleasant, well-developed, well-nourished, and in no acute distress. EYES: Pupils equal and reactive to light, Sclera non-icteric EARS, NOSE, MOUTH AND THROAT:  The oropharynx was clear.  Dentition is good repair.  Oral mucosa pink and moist. LYMPH NODES:  Lymph nodes in the neck and axillae were normal RESPIRATORY:  Lungs were clear.  Normal respiratory effort without pathologic use of accessory muscles of respiration CARDIOVASCULAR: Heart was regular without murmurs.  There were no carotid bruits. GI: The abdomen was soft, nontender, and nondistended. There were no palpable masses. There was no hepatosplenomegaly. There were normal bowel sounds in all quadrants. GU:  Rectal deferred.   MUSCULOSKELETAL:  Normal muscle strength and tone.  No clubbing or cyanosis.   SKIN:  There were no pathologic skin lesions.  There were no nodules on palpation. NEUROLOGIC:  Sensation is normal.  Cranial nerves are grossly intact. PSYCH:  Oriented to person, place and time.  Mood and affect are normal.  Data Reviewed CT scans and PET scans  I have personally reviewed the patient's imaging, laboratory findings and medical records.    Assessment    I have independently reviewed the patient's CT scan and PET scan. The mediastinal lymph nodes are the main question. There is been no change in the size or number of these lymph nodes over the several scans that are available. The endobronchial biopsy  was negative for malignancy. However there is some low-level uptake which is nonspecific and may represent metastatic disease.  I had a long discussion with the patient and his wife today. I explained to them the role of surgical intervention for staging the mediastinum. If a mediastinal endoscopy is entertained that would need to be performed at [facility]. I certainly can arrange for that to have it done very quickly. This will be the more definitive biopsy of the mediastinal lymph nodes but will not be able to assess the hilar lymph nodes. I also reviewed with the patient and his wife the indications and risks of surgery for his left upper lobe mass. They understand that if this is a stage I carcinoma the lung as surgical intervention offers the best long-term survival rates. They also understand that radiation therapy for stage I carcinoma the lung has dramatically improved recently and that results are quite good during  comparable to surgery and some patients.    Plan    After extensive discussion with the family they have would like to think about their options at this point. I would be happy to arrange for any further follow-up as needed. If the patient is deemed to be a surgical candidate I believe that we can see him back and arrange for this to be performed expeditiously. The patient was given our business cards and will contact us once he has determine how he would like to proceed.      Nestor Lewandowsky, MD 12/21/2016, 10:59 AM

## 2016-12-24 DIAGNOSIS — Z01818 Encounter for other preprocedural examination: Secondary | ICD-10-CM | POA: Diagnosis not present

## 2016-12-24 DIAGNOSIS — E119 Type 2 diabetes mellitus without complications: Secondary | ICD-10-CM | POA: Diagnosis not present

## 2016-12-24 DIAGNOSIS — I739 Peripheral vascular disease, unspecified: Secondary | ICD-10-CM | POA: Diagnosis not present

## 2016-12-24 DIAGNOSIS — I208 Other forms of angina pectoris: Secondary | ICD-10-CM | POA: Diagnosis not present

## 2016-12-24 DIAGNOSIS — R9431 Abnormal electrocardiogram [ECG] [EKG]: Secondary | ICD-10-CM | POA: Diagnosis not present

## 2016-12-24 DIAGNOSIS — E785 Hyperlipidemia, unspecified: Secondary | ICD-10-CM | POA: Diagnosis not present

## 2016-12-24 DIAGNOSIS — I2510 Atherosclerotic heart disease of native coronary artery without angina pectoris: Secondary | ICD-10-CM | POA: Diagnosis not present

## 2016-12-24 DIAGNOSIS — E784 Other hyperlipidemia: Secondary | ICD-10-CM | POA: Diagnosis not present

## 2016-12-24 DIAGNOSIS — I10 Essential (primary) hypertension: Secondary | ICD-10-CM | POA: Diagnosis not present

## 2016-12-25 ENCOUNTER — Telehealth: Payer: Self-pay | Admitting: *Deleted

## 2016-12-25 NOTE — Telephone Encounter (Signed)
Spoke with pt's daughter and gave appt information to see Dr. Servando Snare on Thurs 12/27/16 at 4pm, pt instructed to arrive at 3:45pm. Address provided. Informed daughter the importance of this appt in order to accurately stage patient and determine best course of treatment. Pt's daughter, Jacqlyn Larsen, verbalized understanding.

## 2016-12-27 ENCOUNTER — Encounter: Payer: Self-pay | Admitting: Radiation Oncology

## 2016-12-27 ENCOUNTER — Encounter: Payer: Medicare HMO | Admitting: Cardiothoracic Surgery

## 2016-12-27 ENCOUNTER — Ambulatory Visit
Admission: RE | Admit: 2016-12-27 | Discharge: 2016-12-27 | Disposition: A | Discharge status: Home / Self Care | Payer: Medicare HMO | Source: Ambulatory Visit | Attending: Radiation Oncology | Admitting: Radiation Oncology

## 2016-12-27 VITALS — BP 196/101 | HR 66 | Temp 97.0°F | Wt 142.1 lb

## 2016-12-27 DIAGNOSIS — R918 Other nonspecific abnormal finding of lung field: Secondary | ICD-10-CM | POA: Diagnosis not present

## 2016-12-27 DIAGNOSIS — I2510 Atherosclerotic heart disease of native coronary artery without angina pectoris: Secondary | ICD-10-CM | POA: Diagnosis not present

## 2016-12-27 DIAGNOSIS — I252 Old myocardial infarction: Secondary | ICD-10-CM | POA: Insufficient documentation

## 2016-12-27 DIAGNOSIS — Z79899 Other long term (current) drug therapy: Secondary | ICD-10-CM | POA: Diagnosis not present

## 2016-12-27 DIAGNOSIS — Z510 Encounter for antineoplastic radiation therapy: Secondary | ICD-10-CM | POA: Insufficient documentation

## 2016-12-27 DIAGNOSIS — M129 Arthropathy, unspecified: Secondary | ICD-10-CM | POA: Diagnosis not present

## 2016-12-27 DIAGNOSIS — I10 Essential (primary) hypertension: Secondary | ICD-10-CM | POA: Insufficient documentation

## 2016-12-27 DIAGNOSIS — E785 Hyperlipidemia, unspecified: Secondary | ICD-10-CM | POA: Insufficient documentation

## 2016-12-27 DIAGNOSIS — E119 Type 2 diabetes mellitus without complications: Secondary | ICD-10-CM | POA: Diagnosis not present

## 2016-12-27 DIAGNOSIS — Z7982 Long term (current) use of aspirin: Secondary | ICD-10-CM | POA: Insufficient documentation

## 2016-12-27 DIAGNOSIS — R599 Enlarged lymph nodes, unspecified: Secondary | ICD-10-CM | POA: Diagnosis not present

## 2016-12-27 DIAGNOSIS — C3412 Malignant neoplasm of upper lobe, left bronchus or lung: Secondary | ICD-10-CM | POA: Diagnosis not present

## 2016-12-27 DIAGNOSIS — Z87891 Personal history of nicotine dependence: Secondary | ICD-10-CM | POA: Insufficient documentation

## 2016-12-27 DIAGNOSIS — I4901 Ventricular fibrillation: Secondary | ICD-10-CM | POA: Insufficient documentation

## 2016-12-27 NOTE — Consult Note (Signed)
NEW PATIENT EVALUATION  Name: Scott Gross  MRN: [id number]  Date:   12/27/2016     DOB: [birth year]   This 83 y.o. male patient presents to the clinic for initial evaluation of with probable stage I adenocarcinoma of left upper lobe.  REFERRING PHYSICIAN: [name], MD  CHIEF COMPLAINT:  Chief Complaint  Patient presents with  . Lung Cancer    Initial Evaluation    DIAGNOSIS: The encounter diagnosis was Mass of upper lobe of left lung.   PREVIOUS INVESTIGATIONS:  PET CT and CT scans reviewed Pathology reviewed Clinical notes reviewed  HPI: Patient is a 83 year old male with history of previous MI in 2012. He had a episode of increased shortness of breath fatigue and was taken to the emergency room for workup. At that time CT scan showed a left upper lobe mass with bilateral hilar and mediastinal adenopathy. PET CT was performed showing hypermetabolic activity in the left upper lobe mass with some mild less than 3 SUV uptake in both hilar and mediastinal nodes. Patient underwent endobronchial biopsy with ultrasound. Mediastinal lymph node was negative for malignancy biopsy left upper lobe mass was positive for adenocarcinoma. CT-guided biopsy showed adenocarcinoma with hepatic pattern. Patient has been seen by thoracic surgery. Recommendation and referral to thoracic surgeon Shirlee Limerick for for possible mediastinoscopy was made. Patient again has a history of MI in 2012 with ventricular fibrillation arrest had a drug eluding stent placed in the LAD. He is now referred to radiation oncology for  opinion.  PLANNED TREATMENT REGIMEN: SB RT  PAST MEDICAL HISTORY:  has a past medical history of Arthritis; CAD (coronary artery disease) (Nov. 12, 2012); Cardiac arrest [facility]) (Nov. 2012); Cardiac arrest - ventricular fibrillation; Diabetes mellitus without complication ([place]); HOH (hard of hearing); Hyperlipidemia; Hypertension; Post PTCA (2013); and ST elevation MI (STEMI) ([place]) (08/26/2011).     PAST SURGICAL HISTORY:  Past Surgical History:  Procedure Laterality Date  . CARDIAC CATHETERIZATION  2013   @ [place]; [facility] s/p stent   . CORONARY ANGIOPLASTY    . ELECTROMAGNETIC NAVIGATION BROCHOSCOPY N/A 11/27/2016   Procedure: ELECTROMAGNETIC NAVIGATION BRONCHOSCOPY;  Surgeon: [name], MD;  Location: [facility] ORS;  Service: Cardiopulmonary;  Laterality: N/A;    FAMILY HISTORY: family history includes Heart attack in his father and mother.  SOCIAL HISTORY:  reports that he quit smoking about 5 years ago. His smoking use included Cigarettes. He smoked 1.00 pack per day. He has never used smokeless tobacco. He reports that he does not drink alcohol or use drugs.  ALLERGIES: Statins  MEDICATIONS:  Current Outpatient Prescriptions  Medication Sig Dispense Refill  . amLODipine (NORVASC) 5 MG tablet Take 5 mg by mouth as needed. For systolic pressure greater than 150    . aspirin 81 MG tablet Take 81 mg by mouth daily. Patient stopped on November 22, 2016 for procedure    . lisinopril (PRINIVIL,ZESTRIL) 20 MG tablet Take 20 mg by mouth daily.    . metoprolol tartrate (LOPRESSOR) 25 MG tablet Take 25 mg by mouth 2 (two) times daily.     No current facility-administered medications for this encounter.     ECOG PERFORMANCE STATUS:  0 - Asymptomatic  REVIEW OF SYSTEMS: Patient is hard of hearing otherwise Patient denies any weight loss, fatigue, weakness, fever, chills or night sweats. Patient denies any loss of vision, blurred vision. Patient denies any ringing  of the ears or hearing loss. No irregular heartbeat. Patient denies heart murmur or history of fainting. Patient  denies any chest pain or pain radiating to her upper extremities. Patient denies any shortness of breath, difficulty breathing at night, cough or hemoptysis. Patient denies any swelling in the lower legs. Patient denies any nausea vomiting, vomiting of blood, or coffee ground material in the vomitus. Patient denies any  stomach pain. Patient states has had normal bowel movements no significant constipation or diarrhea. Patient denies any dysuria, hematuria or significant nocturia. Patient denies any problems walking, swelling in the joints or loss of balance. Patient denies any skin changes, loss of hair or loss of weight. Patient denies any excessive worrying or anxiety or significant depression. Patient denies any problems with insomnia. Patient denies excessive thirst, polyuria, polydipsia. Patient denies any swollen glands, patient denies easy bruising or easy bleeding. Patient denies any recent infections, allergies or URI. Patient "s visual fields have not changed significantly in recent time.   PHYSICAL EXAM: BP (!) 196/101   Pulse 66   Temp 97 F (36.1 C)   Wt 142 lb 1.4 oz (64.4 kg)   BMI 20.39 kg/m  Well-developed well-nourished patient in NAD. HEENT reveals PERLA, EOMI, discs not visualized.  Oral cavity is clear. No oral mucosal lesions are identified. Neck is clear without evidence of cervical or supraclavicular adenopathy. Lungs are clear to A&P. Cardiac examination is essentially unremarkable with regular rate and rhythm without murmur rub or thrill. Abdomen is benign with no organomegaly or masses noted. Motor sensory and DTR levels are equal and symmetric in the upper and lower extremities. Cranial nerves II through XII are grossly intact. Proprioception is intact. No peripheral adenopathy or edema is identified. No motor or sensory levels are noted. Crude visual fields are within normal range.  LABORATORY DATA: Pathology and cytology reports reviewed    RADIOLOGY RESULTS: CT scans and PET/CT scan reviewed   IMPRESSION: Probable stage I adenocarcinoma of the left upper lobe in 83 year old male with prior cardiac history  PLAN: At this time patient and family are very reluctant to go through surgical procedure. I believe will be highly unusual for a adenocarcinoma the size with lipidic features  to have spread to his lymph nodes and result in a stage IIIB lesion with contralateral disease. Lymph nodes are slightly enlarged although there hypermetabolic activity is below 3 SUV. Certainly may be dealing with benign process or lymphoma as well as small possibility of stage IIIB adenocarcinoma of his left upper lobe. With patient's past medical history he is reluctant to undergo any further surgery. I believe were dealing with an early stage adenocarcinoma the left upper lobe. My recognition be to proceed with SB RT 5000 cGy in 5 fractions using motion gated study. Risks and benefits of treatment including possible development of cough fatigue skin reaction all were discussed in detail with the patient and his family. They will make a decision about referral to [facility] for possible mediastinoscopy. I've tentatively set up CT simulation for next week. I believe in the future should these lymph nodes progress we may be more aggressive at that time for more diagnostic evaluation and can was treated as a later date with radiation and possible chemotherapy.  I would like to take this opportunity to thank you for allowing me to participate in the care of your patient.Armstead Peaks., MD

## 2016-12-31 ENCOUNTER — Ambulatory Visit
Admission: RE | Admit: 2016-12-31 | Discharge: 2016-12-31 | Disposition: A | Discharge status: Home / Self Care | Payer: Medicare HMO | Source: Ambulatory Visit | Attending: Radiation Oncology | Admitting: Radiation Oncology

## 2016-12-31 DIAGNOSIS — I2510 Atherosclerotic heart disease of native coronary artery without angina pectoris: Secondary | ICD-10-CM | POA: Diagnosis not present

## 2016-12-31 DIAGNOSIS — M129 Arthropathy, unspecified: Secondary | ICD-10-CM | POA: Diagnosis not present

## 2016-12-31 DIAGNOSIS — R918 Other nonspecific abnormal finding of lung field: Secondary | ICD-10-CM | POA: Diagnosis not present

## 2016-12-31 DIAGNOSIS — Z87891 Personal history of nicotine dependence: Secondary | ICD-10-CM | POA: Diagnosis not present

## 2016-12-31 DIAGNOSIS — C3412 Malignant neoplasm of upper lobe, left bronchus or lung: Secondary | ICD-10-CM | POA: Diagnosis not present

## 2016-12-31 DIAGNOSIS — I252 Old myocardial infarction: Secondary | ICD-10-CM | POA: Diagnosis not present

## 2016-12-31 DIAGNOSIS — Z510 Encounter for antineoplastic radiation therapy: Secondary | ICD-10-CM | POA: Diagnosis not present

## 2016-12-31 DIAGNOSIS — I4901 Ventricular fibrillation: Secondary | ICD-10-CM | POA: Diagnosis not present

## 2016-12-31 DIAGNOSIS — R599 Enlarged lymph nodes, unspecified: Secondary | ICD-10-CM | POA: Diagnosis not present

## 2016-12-31 DIAGNOSIS — E785 Hyperlipidemia, unspecified: Secondary | ICD-10-CM | POA: Diagnosis not present

## 2016-12-31 DIAGNOSIS — E119 Type 2 diabetes mellitus without complications: Secondary | ICD-10-CM | POA: Diagnosis not present

## 2017-01-01 DIAGNOSIS — E785 Hyperlipidemia, unspecified: Secondary | ICD-10-CM | POA: Diagnosis not present

## 2017-01-01 DIAGNOSIS — I10 Essential (primary) hypertension: Secondary | ICD-10-CM | POA: Diagnosis not present

## 2017-01-01 DIAGNOSIS — E119 Type 2 diabetes mellitus without complications: Secondary | ICD-10-CM | POA: Diagnosis not present

## 2017-01-03 DIAGNOSIS — Z510 Encounter for antineoplastic radiation therapy: Secondary | ICD-10-CM | POA: Diagnosis not present

## 2017-01-03 DIAGNOSIS — I4901 Ventricular fibrillation: Secondary | ICD-10-CM | POA: Diagnosis not present

## 2017-01-03 DIAGNOSIS — I2510 Atherosclerotic heart disease of native coronary artery without angina pectoris: Secondary | ICD-10-CM | POA: Diagnosis not present

## 2017-01-03 DIAGNOSIS — M129 Arthropathy, unspecified: Secondary | ICD-10-CM | POA: Diagnosis not present

## 2017-01-03 DIAGNOSIS — R599 Enlarged lymph nodes, unspecified: Secondary | ICD-10-CM | POA: Diagnosis not present

## 2017-01-03 DIAGNOSIS — I252 Old myocardial infarction: Secondary | ICD-10-CM | POA: Diagnosis not present

## 2017-01-03 DIAGNOSIS — E785 Hyperlipidemia, unspecified: Secondary | ICD-10-CM | POA: Diagnosis not present

## 2017-01-03 DIAGNOSIS — E119 Type 2 diabetes mellitus without complications: Secondary | ICD-10-CM | POA: Diagnosis not present

## 2017-01-03 DIAGNOSIS — R918 Other nonspecific abnormal finding of lung field: Secondary | ICD-10-CM | POA: Diagnosis not present

## 2017-01-03 DIAGNOSIS — C3412 Malignant neoplasm of upper lobe, left bronchus or lung: Secondary | ICD-10-CM | POA: Diagnosis not present

## 2017-01-03 DIAGNOSIS — Z87891 Personal history of nicotine dependence: Secondary | ICD-10-CM | POA: Diagnosis not present

## 2017-01-04 DIAGNOSIS — E785 Hyperlipidemia, unspecified: Secondary | ICD-10-CM | POA: Diagnosis not present

## 2017-01-04 DIAGNOSIS — C3412 Malignant neoplasm of upper lobe, left bronchus or lung: Secondary | ICD-10-CM | POA: Diagnosis not present

## 2017-01-04 DIAGNOSIS — I4901 Ventricular fibrillation: Secondary | ICD-10-CM | POA: Diagnosis not present

## 2017-01-04 DIAGNOSIS — M129 Arthropathy, unspecified: Secondary | ICD-10-CM | POA: Diagnosis not present

## 2017-01-04 DIAGNOSIS — I252 Old myocardial infarction: Secondary | ICD-10-CM | POA: Diagnosis not present

## 2017-01-04 DIAGNOSIS — R599 Enlarged lymph nodes, unspecified: Secondary | ICD-10-CM | POA: Diagnosis not present

## 2017-01-04 DIAGNOSIS — Z87891 Personal history of nicotine dependence: Secondary | ICD-10-CM | POA: Diagnosis not present

## 2017-01-04 DIAGNOSIS — Z510 Encounter for antineoplastic radiation therapy: Secondary | ICD-10-CM | POA: Diagnosis not present

## 2017-01-04 DIAGNOSIS — E119 Type 2 diabetes mellitus without complications: Secondary | ICD-10-CM | POA: Diagnosis not present

## 2017-01-04 DIAGNOSIS — R918 Other nonspecific abnormal finding of lung field: Secondary | ICD-10-CM | POA: Diagnosis not present

## 2017-01-04 DIAGNOSIS — I2510 Atherosclerotic heart disease of native coronary artery without angina pectoris: Secondary | ICD-10-CM | POA: Diagnosis not present

## 2017-01-08 ENCOUNTER — Ambulatory Visit
Admission: RE | Admit: 2017-01-08 | Discharge: 2017-01-08 | Disposition: A | Discharge status: Home / Self Care | Payer: Medicare HMO | Source: Ambulatory Visit | Attending: Radiation Oncology | Admitting: Radiation Oncology

## 2017-01-08 DIAGNOSIS — E785 Hyperlipidemia, unspecified: Secondary | ICD-10-CM | POA: Diagnosis not present

## 2017-01-08 DIAGNOSIS — M129 Arthropathy, unspecified: Secondary | ICD-10-CM | POA: Diagnosis not present

## 2017-01-08 DIAGNOSIS — Z510 Encounter for antineoplastic radiation therapy: Secondary | ICD-10-CM | POA: Diagnosis not present

## 2017-01-08 DIAGNOSIS — R918 Other nonspecific abnormal finding of lung field: Secondary | ICD-10-CM | POA: Diagnosis not present

## 2017-01-08 DIAGNOSIS — C3412 Malignant neoplasm of upper lobe, left bronchus or lung: Secondary | ICD-10-CM | POA: Diagnosis not present

## 2017-01-08 DIAGNOSIS — R599 Enlarged lymph nodes, unspecified: Secondary | ICD-10-CM | POA: Diagnosis not present

## 2017-01-08 DIAGNOSIS — I252 Old myocardial infarction: Secondary | ICD-10-CM | POA: Diagnosis not present

## 2017-01-08 DIAGNOSIS — E119 Type 2 diabetes mellitus without complications: Secondary | ICD-10-CM | POA: Diagnosis not present

## 2017-01-08 DIAGNOSIS — I2510 Atherosclerotic heart disease of native coronary artery without angina pectoris: Secondary | ICD-10-CM | POA: Diagnosis not present

## 2017-01-08 DIAGNOSIS — I4901 Ventricular fibrillation: Secondary | ICD-10-CM | POA: Diagnosis not present

## 2017-01-10 ENCOUNTER — Ambulatory Visit
Admission: RE | Admit: 2017-01-10 | Discharge: 2017-01-10 | Disposition: A | Discharge status: Home / Self Care | Payer: Medicare HMO | Source: Ambulatory Visit | Attending: Radiation Oncology | Admitting: Radiation Oncology

## 2017-01-10 DIAGNOSIS — I4901 Ventricular fibrillation: Secondary | ICD-10-CM | POA: Diagnosis not present

## 2017-01-10 DIAGNOSIS — E119 Type 2 diabetes mellitus without complications: Secondary | ICD-10-CM | POA: Diagnosis not present

## 2017-01-10 DIAGNOSIS — M129 Arthropathy, unspecified: Secondary | ICD-10-CM | POA: Diagnosis not present

## 2017-01-10 DIAGNOSIS — C3412 Malignant neoplasm of upper lobe, left bronchus or lung: Secondary | ICD-10-CM | POA: Diagnosis not present

## 2017-01-10 DIAGNOSIS — Z510 Encounter for antineoplastic radiation therapy: Secondary | ICD-10-CM | POA: Diagnosis not present

## 2017-01-10 DIAGNOSIS — E785 Hyperlipidemia, unspecified: Secondary | ICD-10-CM | POA: Diagnosis not present

## 2017-01-10 DIAGNOSIS — I2510 Atherosclerotic heart disease of native coronary artery without angina pectoris: Secondary | ICD-10-CM | POA: Diagnosis not present

## 2017-01-10 DIAGNOSIS — I252 Old myocardial infarction: Secondary | ICD-10-CM | POA: Diagnosis not present

## 2017-01-10 DIAGNOSIS — R918 Other nonspecific abnormal finding of lung field: Secondary | ICD-10-CM | POA: Diagnosis not present

## 2017-01-10 DIAGNOSIS — R599 Enlarged lymph nodes, unspecified: Secondary | ICD-10-CM | POA: Diagnosis not present

## 2017-01-14 ENCOUNTER — Ambulatory Visit
Admission: RE | Admit: 2017-01-14 | Discharge: 2017-01-14 | Disposition: A | Discharge status: Home / Self Care | Payer: Medicare HMO | Source: Ambulatory Visit | Attending: Radiation Oncology | Admitting: Radiation Oncology

## 2017-01-14 DIAGNOSIS — E119 Type 2 diabetes mellitus without complications: Secondary | ICD-10-CM | POA: Diagnosis not present

## 2017-01-14 DIAGNOSIS — R599 Enlarged lymph nodes, unspecified: Secondary | ICD-10-CM | POA: Diagnosis not present

## 2017-01-14 DIAGNOSIS — E785 Hyperlipidemia, unspecified: Secondary | ICD-10-CM | POA: Diagnosis not present

## 2017-01-14 DIAGNOSIS — C3412 Malignant neoplasm of upper lobe, left bronchus or lung: Secondary | ICD-10-CM | POA: Diagnosis not present

## 2017-01-14 DIAGNOSIS — I4901 Ventricular fibrillation: Secondary | ICD-10-CM | POA: Diagnosis not present

## 2017-01-14 DIAGNOSIS — I252 Old myocardial infarction: Secondary | ICD-10-CM | POA: Diagnosis not present

## 2017-01-14 DIAGNOSIS — R918 Other nonspecific abnormal finding of lung field: Secondary | ICD-10-CM | POA: Diagnosis not present

## 2017-01-14 DIAGNOSIS — I2510 Atherosclerotic heart disease of native coronary artery without angina pectoris: Secondary | ICD-10-CM | POA: Diagnosis not present

## 2017-01-14 DIAGNOSIS — M129 Arthropathy, unspecified: Secondary | ICD-10-CM | POA: Diagnosis not present

## 2017-01-14 DIAGNOSIS — Z510 Encounter for antineoplastic radiation therapy: Secondary | ICD-10-CM | POA: Diagnosis not present

## 2017-01-15 ENCOUNTER — Ambulatory Visit: Payer: Medicare HMO

## 2017-01-16 ENCOUNTER — Ambulatory Visit
Admission: RE | Admit: 2017-01-16 | Discharge: 2017-01-16 | Disposition: A | Discharge status: Home / Self Care | Payer: Medicare HMO | Source: Ambulatory Visit | Attending: Radiation Oncology | Admitting: Radiation Oncology

## 2017-01-16 DIAGNOSIS — I252 Old myocardial infarction: Secondary | ICD-10-CM | POA: Diagnosis not present

## 2017-01-16 DIAGNOSIS — I2510 Atherosclerotic heart disease of native coronary artery without angina pectoris: Secondary | ICD-10-CM | POA: Diagnosis not present

## 2017-01-16 DIAGNOSIS — C3412 Malignant neoplasm of upper lobe, left bronchus or lung: Secondary | ICD-10-CM | POA: Diagnosis not present

## 2017-01-16 DIAGNOSIS — I4901 Ventricular fibrillation: Secondary | ICD-10-CM | POA: Diagnosis not present

## 2017-01-16 DIAGNOSIS — R599 Enlarged lymph nodes, unspecified: Secondary | ICD-10-CM | POA: Diagnosis not present

## 2017-01-16 DIAGNOSIS — E785 Hyperlipidemia, unspecified: Secondary | ICD-10-CM | POA: Diagnosis not present

## 2017-01-16 DIAGNOSIS — R918 Other nonspecific abnormal finding of lung field: Secondary | ICD-10-CM | POA: Diagnosis not present

## 2017-01-16 DIAGNOSIS — E119 Type 2 diabetes mellitus without complications: Secondary | ICD-10-CM | POA: Diagnosis not present

## 2017-01-16 DIAGNOSIS — Z510 Encounter for antineoplastic radiation therapy: Secondary | ICD-10-CM | POA: Diagnosis not present

## 2017-01-16 DIAGNOSIS — M129 Arthropathy, unspecified: Secondary | ICD-10-CM | POA: Diagnosis not present

## 2017-01-17 ENCOUNTER — Ambulatory Visit: Payer: Medicare HMO

## 2017-01-21 ENCOUNTER — Ambulatory Visit
Admission: RE | Admit: 2017-01-21 | Discharge: 2017-01-21 | Disposition: A | Discharge status: Home / Self Care | Payer: Medicare HMO | Source: Ambulatory Visit | Attending: Radiation Oncology | Admitting: Radiation Oncology

## 2017-01-21 DIAGNOSIS — R599 Enlarged lymph nodes, unspecified: Secondary | ICD-10-CM | POA: Diagnosis not present

## 2017-01-21 DIAGNOSIS — I252 Old myocardial infarction: Secondary | ICD-10-CM | POA: Diagnosis not present

## 2017-01-21 DIAGNOSIS — R918 Other nonspecific abnormal finding of lung field: Secondary | ICD-10-CM | POA: Diagnosis not present

## 2017-01-21 DIAGNOSIS — M129 Arthropathy, unspecified: Secondary | ICD-10-CM | POA: Diagnosis not present

## 2017-01-21 DIAGNOSIS — E785 Hyperlipidemia, unspecified: Secondary | ICD-10-CM | POA: Diagnosis not present

## 2017-01-21 DIAGNOSIS — I2510 Atherosclerotic heart disease of native coronary artery without angina pectoris: Secondary | ICD-10-CM | POA: Diagnosis not present

## 2017-01-21 DIAGNOSIS — Z510 Encounter for antineoplastic radiation therapy: Secondary | ICD-10-CM | POA: Diagnosis not present

## 2017-01-21 DIAGNOSIS — Z87891 Personal history of nicotine dependence: Secondary | ICD-10-CM | POA: Diagnosis not present

## 2017-01-21 DIAGNOSIS — C3412 Malignant neoplasm of upper lobe, left bronchus or lung: Secondary | ICD-10-CM | POA: Diagnosis not present

## 2017-01-21 DIAGNOSIS — E119 Type 2 diabetes mellitus without complications: Secondary | ICD-10-CM | POA: Diagnosis not present

## 2017-01-21 DIAGNOSIS — I4901 Ventricular fibrillation: Secondary | ICD-10-CM | POA: Diagnosis not present

## 2017-01-22 ENCOUNTER — Ambulatory Visit: Payer: Medicare HMO

## 2017-01-24 DIAGNOSIS — I2510 Atherosclerotic heart disease of native coronary artery without angina pectoris: Secondary | ICD-10-CM | POA: Diagnosis not present

## 2017-01-24 DIAGNOSIS — I208 Other forms of angina pectoris: Secondary | ICD-10-CM | POA: Diagnosis not present

## 2017-01-24 DIAGNOSIS — Z01818 Encounter for other preprocedural examination: Secondary | ICD-10-CM | POA: Diagnosis not present

## 2017-01-29 ENCOUNTER — Ambulatory Visit: Payer: Medicare HMO | Admitting: Oncology

## 2017-02-04 ENCOUNTER — Encounter: Payer: Self-pay | Admitting: Radiation Oncology

## 2017-02-04 ENCOUNTER — Ambulatory Visit
Admission: RE | Admit: 2017-02-04 | Discharge: 2017-02-04 | Disposition: A | Discharge status: Home / Self Care | Payer: Medicare HMO | Source: Ambulatory Visit | Attending: Radiation Oncology | Admitting: Radiation Oncology

## 2017-02-04 VITALS — BP 165/95 | HR 71 | Temp 96.2°F | Wt 145.0 lb

## 2017-02-04 DIAGNOSIS — R918 Other nonspecific abnormal finding of lung field: Secondary | ICD-10-CM | POA: Diagnosis not present

## 2017-02-04 DIAGNOSIS — R599 Enlarged lymph nodes, unspecified: Secondary | ICD-10-CM | POA: Diagnosis not present

## 2017-02-04 DIAGNOSIS — Z510 Encounter for antineoplastic radiation therapy: Secondary | ICD-10-CM | POA: Diagnosis not present

## 2017-02-04 DIAGNOSIS — I4901 Ventricular fibrillation: Secondary | ICD-10-CM | POA: Diagnosis not present

## 2017-02-04 DIAGNOSIS — E785 Hyperlipidemia, unspecified: Secondary | ICD-10-CM | POA: Diagnosis not present

## 2017-02-04 DIAGNOSIS — E119 Type 2 diabetes mellitus without complications: Secondary | ICD-10-CM | POA: Diagnosis not present

## 2017-02-04 DIAGNOSIS — I252 Old myocardial infarction: Secondary | ICD-10-CM | POA: Diagnosis not present

## 2017-02-04 DIAGNOSIS — I2510 Atherosclerotic heart disease of native coronary artery without angina pectoris: Secondary | ICD-10-CM | POA: Diagnosis not present

## 2017-02-04 DIAGNOSIS — M129 Arthropathy, unspecified: Secondary | ICD-10-CM | POA: Diagnosis not present

## 2017-02-04 DIAGNOSIS — C3412 Malignant neoplasm of upper lobe, left bronchus or lung: Secondary | ICD-10-CM | POA: Diagnosis not present

## 2017-02-04 NOTE — Progress Notes (Signed)
Patient is seen today and will request 2 weeks after completing SB RT to his left upper lobe for probable stage I adenocarcinoma. He is developeda pimple on his left anterior chest.Asymptomatic. I've assured in this is nothing to do with radiation and will resolve on its own. He will continue his follow-ups with [place] in May and plan a CT scan of the chest for evaluation in about 3 months.

## 2017-02-28 ENCOUNTER — Other Ambulatory Visit: Payer: Self-pay | Admitting: *Deleted

## 2017-02-28 ENCOUNTER — Ambulatory Visit
Admission: RE | Admit: 2017-02-28 | Discharge: 2017-02-28 | Disposition: A | Discharge status: Home / Self Care | Payer: Medicare HMO | Source: Ambulatory Visit | Attending: Radiation Oncology | Admitting: Radiation Oncology

## 2017-02-28 ENCOUNTER — Encounter: Payer: Self-pay | Admitting: Radiation Oncology

## 2017-02-28 VITALS — BP 156/79 | HR 58 | Resp 20 | Wt 143.4 lb

## 2017-02-28 DIAGNOSIS — R918 Other nonspecific abnormal finding of lung field: Secondary | ICD-10-CM

## 2017-02-28 DIAGNOSIS — Z87891 Personal history of nicotine dependence: Secondary | ICD-10-CM | POA: Insufficient documentation

## 2017-02-28 DIAGNOSIS — C3412 Malignant neoplasm of upper lobe, left bronchus or lung: Secondary | ICD-10-CM | POA: Insufficient documentation

## 2017-02-28 DIAGNOSIS — Z923 Personal history of irradiation: Secondary | ICD-10-CM | POA: Diagnosis not present

## 2017-02-28 NOTE — Progress Notes (Signed)
Radiation Oncology Follow up Note  Name: Scott Gross   Date:   02/28/2017 MRN:  [id number] DOB: 02/01/1934    This 83 y.o. male presents to the clinic today for one-month follow-up status post SB RT to his left upper lobe for probable stage I adenocarcinoma.  REFERRING PROVIDER: [name], MD  HPI: Patient is a 83 year old male now out 1 month having completed SB RT to his left upper lobe for presumed stage I adenocarcinoma seen today in routine follow up is doing well. Specifically denies cough dysphasia any change in his pulmonary status or fatigue..  COMPLICATIONS OF TREATMENT: none  FOLLOW UP COMPLIANCE: keeps appointments   PHYSICAL EXAM:  BP (!) 156/79   Pulse (!) 58   Resp 20   Wt 143 lb 6.6 oz (65.1 kg)   BMI 20.58 kg/m  Well-developed well-nourished patient in NAD. HEENT reveals PERLA, EOMI, discs not visualized.  Oral cavity is clear. No oral mucosal lesions are identified. Neck is clear without evidence of cervical or supraclavicular adenopathy. Lungs are clear to A&P. Cardiac examination is essentially unremarkable with regular rate and rhythm without murmur rub or thrill. Abdomen is benign with no organomegaly or masses noted. Motor sensory and DTR levels are equal and symmetric in the upper and lower extremities. Cranial nerves II through XII are grossly intact. Proprioception is intact. No peripheral adenopathy or edema is identified. No motor or sensory levels are noted. Crude visual fields are within normal range.  RADIOLOGY RESULTS: No current films for review  PLAN: At the present time he is a very low side effect profile from his recent SB RT. I'm please was overall progress. I've ordered a follow-up in 3-4 months with a CT scan of the chest with contrast 1 week prior to that visit. Patient knows to call with any concerns. I am pleased with his progress.  I would like to take this opportunity to thank you for allowing me to participate in the care of your  patient.Armstead Peaks., MD

## 2017-03-12 NOTE — Progress Notes (Signed)
[place]  Telephone:(336) [phone] Fax:(336) [phone]  ID: Scott Gross OB: 1934/01/09  MR#: [id number]  HEN#:[id number]  Patient Care Team: [name], MD as PCP - General (Family Medicine)  CHIEF COMPLAINT: Adenocarcinoma of upper lobe of left lung.  INTERVAL HISTORY: Patient returns to clinic today for further evaluation. He recently completed XRT to the lesion in his left upper lobe. He currently feels well and is asymptomatic. He has no neurologic complaints. He denies any chest pain, cough, or shortness of breath today. He has a good appetite and denies weight loss. He denies any recent fevers or illnesses. He has no nausea, vomiting, constipation, or diarrhea. He has no urinary complaints. Patient offers no specific complaints today.  REVIEW OF SYSTEMS:   Review of Systems  Constitutional: Negative.  Negative for fever, malaise/fatigue and weight loss.  Respiratory: Negative for cough, hemoptysis and shortness of breath.   Cardiovascular: Negative.  Negative for chest pain and leg swelling.  Gastrointestinal: Negative.  Negative for abdominal pain.  Genitourinary: Negative.   Musculoskeletal: Negative.  Negative for joint pain.  Skin: Negative.  Negative for rash.  Neurological: Negative.  Negative for weakness.  Psychiatric/Behavioral: Negative.  The patient is not nervous/anxious.     As per HPI. Otherwise, a complete review of systems is negative.  PAST MEDICAL HISTORY: Past Medical History:  Diagnosis Date  . Arthritis   . CAD (coronary artery disease) Nov. 12, 2012   Inferior ST elevation MI in November of 2012 with ventricular fibrillation arrest. Status post drug-eluting stent placement to the mid LAD. Most recent cardiac catheterization in June of this year showed patent stent with minimal restenosis, 75% proximal LAD and 80% distal LAD which were unchanged from before. Mild disease in the left circumflex and moderate RCA disease. Ejection  fraction was 55%.  . Cardiac arrest [name]) Nov. 2012   x 2   . Cardiac arrest - ventricular fibrillation   . Diabetes mellitus without complication ([place])    Diet Controlled  . HOH (hard of hearing)    Left Hearing Aid  . Hyperlipidemia   . Hypertension   . Post PTCA 2013   x2  . ST elevation MI (STEMI) ([place]) 08/26/2011    PAST SURGICAL HISTORY: Past Surgical History:  Procedure Laterality Date  . CARDIAC CATHETERIZATION  2013   @ [place]; [facility] s/p stent   . CORONARY ANGIOPLASTY    . ELECTROMAGNETIC NAVIGATION BROCHOSCOPY N/A 11/27/2016   Procedure: ELECTROMAGNETIC NAVIGATION BRONCHOSCOPY;  Surgeon: [name], MD;  Location: [facility] ORS;  Service: Cardiopulmonary;  Laterality: N/A;    FAMILY HISTORY: Family History  Problem Relation Age of Onset  . Heart attack Mother   . Heart attack Father     ADVANCED DIRECTIVES (Y/N):  N  HEALTH MAINTENANCE: Social History  Substance Use Topics  . Smoking status: Former Smoker    Packs/day: 1.00    Types: Cigarettes    Quit date: 08/27/2011  . Smokeless tobacco: Never Used  . Alcohol use No     Colonoscopy:  PAP:  Bone density:  Lipid panel:  Allergies  Allergen Reactions  . Statins Other (See Comments)    Arthralagia    Current Outpatient Prescriptions  Medication Sig Dispense Refill  . amLODipine (NORVASC) 5 MG tablet Take 5 mg by mouth as needed. For systolic pressure greater than 150    . aspirin 81 MG tablet Take 81 mg by mouth daily. Patient stopped on November 22, 2016 for procedure    .  lisinopril (PRINIVIL,ZESTRIL) 20 MG tablet Take 20 mg by mouth daily.    . metoprolol tartrate (LOPRESSOR) 25 MG tablet Take 25 mg by mouth 2 (two) times daily.    . VENTOLIN HFA 108 (90 Base) MCG/ACT inhaler      No current facility-administered medications for this visit.     OBJECTIVE: Vitals:   03/13/17 1039  BP: (!) 173/108  Pulse: 62  Resp: 20  Temp: 98.7 F (37.1 C)     Body mass index is 20.66 kg/m.    ECOG  FS:0 - Asymptomatic  General: Well-developed, well-nourished, no acute distress. Eyes: Pink conjunctiva, anicteric sclera. Lungs: Clear to auscultation bilaterally. Heart: Regular rate and rhythm. No rubs, murmurs, or gallops. Abdomen: Soft, nontender, nondistended. No organomegaly noted, normoactive bowel sounds. Musculoskeletal: No edema, cyanosis, or clubbing. Neuro: Alert, answering all questions appropriately. Cranial nerves grossly intact. Skin: No rashes or petechiae noted. Psych: Normal affect.   LAB RESULTS:  Lab Results  Component Value Date   NA 137 09/03/2016   K 3.9 11/23/2016   CL 101 09/03/2016   CO2 29 09/03/2016   GLUCOSE 179 (H) 09/03/2016   BUN 11 09/03/2016   CREATININE 1.00 11/14/2016   CALCIUM 8.8 (L) 09/03/2016   PROT 7.4 11/28/2013   ALBUMIN 3.9 11/28/2013   AST 33 11/28/2013   ALT 36 11/28/2013   ALKPHOS 99 11/28/2013   BILITOT 0.6 11/28/2013   GFRNONAA >60 09/03/2016   GFRAA >60 09/03/2016    Lab Results  Component Value Date   WBC 17.3 (H) 09/03/2016   NEUTROABS 6.0 11/28/2013   HGB 16.1 09/03/2016   HCT 46.7 09/03/2016   MCV 89.6 09/03/2016   PLT 167 09/03/2016     STUDIES: No results found.  ASSESSMENT:  Adenocarcinoma of upper lobe of left lung   PLAN:   1.  Adenocarcinoma of upper lobe of left lung: PET scan results reviewed independently highly suspicious for underlying malignancy. Biopsy of lung nodule revealed adenocarcinoma, but there was no invasive component on the sample. Biopsy of mediastinal lymph node had insufficient material. Patient ultimately declined any further biopsies or surgery and elected to proceed with XRT alone. He recent completed XRT.  No further intervention is needed at this time.  Return to clinic in mid-August with repeat imaging and further evaluation.  2. Hypertension: Patient's blood pressure is significantly elevated today. Continue evaluation and treatment for primary care.  Approximately 20  minutes was spent in discussion of which greater than 50% was consultation.  Patient expressed understanding and was in agreement with this plan. He also understands that He can call clinic at any time with any questions, concerns, or complaints.   Cancer Staging No matching staging information was found for the patient.  [name], MD   03/16/2017 9:43 PM

## 2017-03-13 ENCOUNTER — Encounter: Payer: Self-pay | Admitting: *Deleted

## 2017-03-13 ENCOUNTER — Inpatient Hospital Stay: Payer: Medicare HMO | Attending: Oncology | Admitting: Oncology

## 2017-03-13 VITALS — BP 173/108 | HR 62 | Temp 98.7°F | Resp 20 | Wt 144.0 lb

## 2017-03-13 DIAGNOSIS — Z7982 Long term (current) use of aspirin: Secondary | ICD-10-CM

## 2017-03-13 DIAGNOSIS — C3412 Malignant neoplasm of upper lobe, left bronchus or lung: Secondary | ICD-10-CM

## 2017-03-13 DIAGNOSIS — Z8674 Personal history of sudden cardiac arrest: Secondary | ICD-10-CM | POA: Insufficient documentation

## 2017-03-13 DIAGNOSIS — Z923 Personal history of irradiation: Secondary | ICD-10-CM | POA: Diagnosis not present

## 2017-03-13 DIAGNOSIS — E785 Hyperlipidemia, unspecified: Secondary | ICD-10-CM | POA: Diagnosis not present

## 2017-03-13 DIAGNOSIS — Z87891 Personal history of nicotine dependence: Secondary | ICD-10-CM | POA: Diagnosis not present

## 2017-03-13 DIAGNOSIS — I2510 Atherosclerotic heart disease of native coronary artery without angina pectoris: Secondary | ICD-10-CM | POA: Insufficient documentation

## 2017-03-13 DIAGNOSIS — E119 Type 2 diabetes mellitus without complications: Secondary | ICD-10-CM | POA: Insufficient documentation

## 2017-03-13 DIAGNOSIS — Z79899 Other long term (current) drug therapy: Secondary | ICD-10-CM | POA: Diagnosis not present

## 2017-03-13 DIAGNOSIS — I10 Essential (primary) hypertension: Secondary | ICD-10-CM | POA: Insufficient documentation

## 2017-03-13 NOTE — Progress Notes (Signed)
  Oncology Nurse Navigator Documentation  Navigator Location: [facility]-Med Onc (03/13/17 1100)   )Navigator Encounter Type: Follow-up Appt (03/13/17 1100)                     Patient Visit Type: MedOnc (03/13/17 1100) Treatment Phase: Follow-up (03/13/17 1100) Barriers/Navigation Needs: No barriers at this time (03/13/17 1100)   Interventions: None required (03/13/17 1100)           met with patient during follow up visit with Dr. Grayland Ormond after completing radiation treatments. Pt had questions regarding ongoing muscle pain in shoulders and if related to taking metoprolol. Pt encouraged to contact cardiologist to discuss possible side effects of metoprolol. Pt advised that could try physical therapy or message therapy to the area but pt declined and stated will contact his cardiologist and/or PCP to discuss further management. No other needs or questions at this time. Informed pt to contact me if has any further needs. Pt verbalized understanding.           Time Spent with Patient: 30 (03/13/17 1100)

## 2017-03-13 NOTE — Progress Notes (Signed)
Patient denies any concerns today.  

## 2017-05-23 ENCOUNTER — Ambulatory Visit
Admission: RE | Admit: 2017-05-23 | Discharge: 2017-05-23 | Disposition: A | Discharge status: Home / Self Care | Payer: Medicare HMO | Source: Ambulatory Visit | Attending: Radiation Oncology | Admitting: Radiation Oncology

## 2017-05-23 DIAGNOSIS — R918 Other nonspecific abnormal finding of lung field: Secondary | ICD-10-CM | POA: Diagnosis not present

## 2017-05-23 DIAGNOSIS — J438 Other emphysema: Secondary | ICD-10-CM | POA: Diagnosis not present

## 2017-05-23 DIAGNOSIS — I700 Atherosclerosis of aorta: Secondary | ICD-10-CM | POA: Diagnosis not present

## 2017-05-23 DIAGNOSIS — I2510 Atherosclerotic heart disease of native coronary artery without angina pectoris: Secondary | ICD-10-CM | POA: Insufficient documentation

## 2017-05-23 DIAGNOSIS — J432 Centrilobular emphysema: Secondary | ICD-10-CM | POA: Insufficient documentation

## 2017-05-23 DIAGNOSIS — R911 Solitary pulmonary nodule: Secondary | ICD-10-CM | POA: Diagnosis not present

## 2017-05-23 LAB — POCT I-STAT CREATININE: Creatinine, Ser: 1 mg/dL (ref 0.61–1.24)

## 2017-05-23 MED ORDER — IOPAMIDOL (ISOVUE-300) INJECTION 61%
80.0000 mL | Freq: Once | INTRAVENOUS | Status: AC | PRN
  Administered 2017-05-23: 75 mL via INTRAVENOUS

## 2017-05-29 NOTE — Progress Notes (Signed)
[place]  Telephone:(336) [phone] Fax:(336) [phone]  ID: Scott Gross OB: Jan 20, 1934  MR#: [id number]  RDE#:[id number]  Patient Care Team: [name], MD as PCP - General (Family Medicine)  CHIEF COMPLAINT: Adenocarcinoma of upper lobe of left lung.  INTERVAL HISTORY: Patient returns to clinic today for further evaluation and discussion of his imaging results. He completed XRT to the lesion in his left upper lobe in May 2018. He currently feels well and is asymptomatic. He has no neurologic complaints. He denies any chest pain, cough, or shortness of breath today. He has a good appetite and denies weight loss. He denies any recent fevers or illnesses. He has no nausea, vomiting, constipation, or diarrhea. He has no urinary complaints. Patient offers no specific complaints today.  REVIEW OF SYSTEMS:   Review of Systems  Constitutional: Negative.  Negative for fever, malaise/fatigue and weight loss.  Respiratory: Negative for cough, hemoptysis and shortness of breath.   Cardiovascular: Negative.  Negative for chest pain and leg swelling.  Gastrointestinal: Negative.  Negative for abdominal pain.  Genitourinary: Negative.   Musculoskeletal: Negative.  Negative for joint pain.  Skin: Negative.  Negative for rash.  Neurological: Negative.  Negative for weakness.  Psychiatric/Behavioral: Negative.  The patient is not nervous/anxious.     As per HPI. Otherwise, a complete review of systems is negative.  PAST MEDICAL HISTORY: Past Medical History:  Diagnosis Date  . Arthritis   . CAD (coronary artery disease) Nov. 12, 2012   Inferior ST elevation MI in November of 2012 with ventricular fibrillation arrest. Status post drug-eluting stent placement to the mid LAD. Most recent cardiac catheterization in June of this year showed patent stent with minimal restenosis, 75% proximal LAD and 80% distal LAD which were unchanged from before. Mild disease in the left  circumflex and moderate RCA disease. Ejection fraction was 55%.  . Cardiac arrest [facility]) Nov. 2012   x 2   . Cardiac arrest - ventricular fibrillation   . Diabetes mellitus without complication ([place])    Diet Controlled  . HOH (hard of hearing)    Left Hearing Aid  . Hyperlipidemia   . Hypertension   . Post PTCA 2013   x2  . ST elevation MI (STEMI) ([place]) 08/26/2011    PAST SURGICAL HISTORY: Past Surgical History:  Procedure Laterality Date  . CARDIAC CATHETERIZATION  2013   @ [place]; [facility] s/p stent   . CORONARY ANGIOPLASTY    . ELECTROMAGNETIC NAVIGATION BROCHOSCOPY N/A 11/27/2016   Procedure: ELECTROMAGNETIC NAVIGATION BRONCHOSCOPY;  Surgeon: [name], MD;  Location: [facility] ORS;  Service: Cardiopulmonary;  Laterality: N/A;    FAMILY HISTORY: Family History  Problem Relation Age of Onset  . Heart attack Mother   . Heart attack Father     ADVANCED DIRECTIVES (Y/N):  N  HEALTH MAINTENANCE: Social History  Substance Use Topics  . Smoking status: Former Smoker    Packs/day: 1.00    Types: Cigarettes    Quit date: 08/27/2011  . Smokeless tobacco: Never Used  . Alcohol use No     Colonoscopy:  PAP:  Bone density:  Lipid panel:  Allergies  Allergen Reactions  . Statins Other (See Comments)    Arthralagia    Current Outpatient Prescriptions  Medication Sig Dispense Refill  . amLODipine (NORVASC) 5 MG tablet Take 5 mg by mouth as needed. For systolic pressure greater than 150    . aspirin 81 MG tablet Take 81 mg by mouth daily. Patient stopped  on November 22, 2016 for procedure    . lisinopril (PRINIVIL,ZESTRIL) 20 MG tablet Take 20 mg by mouth daily.    . metoprolol tartrate (LOPRESSOR) 25 MG tablet Take 25 mg by mouth 2 (two) times daily.     No current facility-administered medications for this visit.     OBJECTIVE: Vitals:   05/30/17 1104  BP: (!) 179/96  Pulse: 60  Resp: 20     Body mass index is 20.66 kg/m.    ECOG FS:0 - Asymptomatic  General:  Well-developed, well-nourished, no acute distress. Eyes: Pink conjunctiva, anicteric sclera. Lungs: Clear to auscultation bilaterally. Heart: Regular rate and rhythm. No rubs, murmurs, or gallops. Abdomen: Soft, nontender, nondistended. No organomegaly noted, normoactive bowel sounds. Musculoskeletal: No edema, cyanosis, or clubbing. Neuro: Alert, answering all questions appropriately. Cranial nerves grossly intact. Skin: No rashes or petechiae noted. Psych: Normal affect.   LAB RESULTS:  Lab Results  Component Value Date   NA 137 09/03/2016   K 3.9 11/23/2016   CL 101 09/03/2016   CO2 29 09/03/2016   GLUCOSE 179 (H) 09/03/2016   BUN 11 09/03/2016   CREATININE 1.00 05/23/2017   CALCIUM 8.8 (L) 09/03/2016   PROT 7.4 11/28/2013   ALBUMIN 3.9 11/28/2013   AST 33 11/28/2013   ALT 36 11/28/2013   ALKPHOS 99 11/28/2013   BILITOT 0.6 11/28/2013   GFRNONAA >60 09/03/2016   GFRAA >60 09/03/2016    Lab Results  Component Value Date   WBC 17.3 (H) 09/03/2016   NEUTROABS 6.0 11/28/2013   HGB 16.1 09/03/2016   HCT 46.7 09/03/2016   MCV 89.6 09/03/2016   PLT 167 09/03/2016     STUDIES: Ct Chest W Contrast  Result Date: 05/23/2017 CLINICAL DATA:  83 year old male with history of mass in the left lung status post radiation therapy. Followup study. EXAM: CT CHEST WITH CONTRAST TECHNIQUE: Multidetector CT imaging of the chest was performed during intravenous contrast administration. CONTRAST:  57mL ISOVUE-300 IOPAMIDOL (ISOVUE-300) INJECTION 61% COMPARISON:  Chest CT 11/14/2016.  PET-CT 09/24/2016. FINDINGS: Cardiovascular: Heart size is normal. There is no significant pericardial fluid, thickening or pericardial calcification. There is aortic atherosclerosis, as well as atherosclerosis of the great vessels of the mediastinum and the coronary arteries, including calcified atherosclerotic plaque in the left anterior descending and right coronary arteries. Mediastinum/Nodes: No  pathologically enlarged mediastinal or hilar lymph nodes. Multiple prominent mediastinal lymph nodes are again noted, measuring up to 7 mm in short axis in the right paratracheal nodal station (nonspecific). Esophagus is unremarkable in appearance. No axillary lymphadenopathy. Lungs/Pleura: When compared to the prior examination, the previously noted nodule in the left upper lobe is now largely obscured by a band-like opacity. Along the lateral edge of this opacity when viewed on soft tissue windows (axial image 34 of series 2 and coronal image 69 of series 5) there is a low-attenuation nodule measuring 1.0 x 1.3 x 1.1 cm, which is likely to represent a necrotic area of residual tumor. In the adjacent left upper lobe there are some small fiducial markers, as well as extensive septal thickening and surrounding ground-glass attenuation, most compatible with evolving postradiation changes. Notably, while the most solid-appearing portion of the previously noted nodule is largely obscured, along the more inferior aspect of the lesion there continues to be a more focal nodular appearing area of ground-glass attenuation and septal thickening, best appreciated on image 42 of series 3 where this region is larger than the prior study in currently measures approximately 1.9  x 1.9 cm. Notably, this area was hypermetabolic on the prior PET-CT, and given the pathologic diagnosis of adenocarcinoma from biopsy 11/27/2016, this area is concerning for residual tumor which appears progressively enlarged compared to the prior examination, although additional area of evolving postradiation changes not entirely excluded. The most inferior fiducial marker is located within this region (axial image 44 of series 3). Small satellite nodule in the left upper lobe measuring 6 mm (image 40 of series 3) is new compared to the prior examination. Diffuse bronchial wall thickening with mild centrilobular and paraseptal emphysema. No pleural  effusions. Upper Abdomen: Visualized portions are unremarkable. Musculoskeletal: Old healed fracture of the upper sternum. There are no aggressive appearing lytic or blastic lesions noted in the visualized portions of the skeleton. IMPRESSION: 1. Evolving post radiation changes in the left upper lobe, now partially obscuring the previously noted left upper lobe pulmonary nodule. Importantly, along the inferior aspect of the treated area there is increasing conspicuity of what is now a 1.9 x 1.9 cm area of ground-glass attenuation and septal thickening which appears highly suspicious for progressive adenocarcinoma. There is a possibility that this could reflect an additional area of evolving postradiation change, as the inferior fiducial marker is located at the leading edge of this region, however, correlation with prior stimulation CT is recommended, and close attention to this area on follow-up studies is strongly suggested. 2. New satellite nodule measuring 6 mm in the left upper lobe (axial image 40 of series 3) is nonspecific but warrants attention on follow-up studies. 3. Multiple borderline enlarged mediastinal lymph nodes are nonspecific. 4. Mild diffuse bronchial wall thickening with mild centrilobular and paraseptal emphysema. 5. Aortic atherosclerosis, in addition to 2 vessel coronary artery disease. Aortic Atherosclerosis (ICD10-I70.0) and Emphysema (ICD10-J43.9). Electronically Signed   By: Vinnie Langton M.D.   On: 05/23/2017 11:00    ASSESSMENT:  Adenocarcinoma of upper lobe of left lung   PLAN:   1.  Adenocarcinoma of upper lobe of left lung: CT scan results reviewed independently and reported as above with evolving postradiation changes and an area in the left upper lobe suspicious for progressive adenocarcinoma. Will continue simple observation, but repeat CT scans 3 months to assess for interval change. Previously, biopsy of lung nodule revealed adenocarcinoma, but there was no invasive  component on the sample. Biopsy of mediastinal lymph node had insufficient material. Patient ultimately declined any further biopsies or surgery and elected to proceed with XRT alone. He completed XRT in May 2018.  2. Hypertension: Patient's blood pressure is significantly elevated today. Continue evaluation and treatment for primary care.  Approximately 30 minutes was spent in discussion of which greater than 50% was consultation.  Patient expressed understanding and was in agreement with this plan. He also understands that He can call clinic at any time with any questions, concerns, or complaints.   Cancer Staging No matching staging information was found for the patient.  Emilee Market G. Zenia Resides, NP 05/29/17 1207 PM  Patient was seen and evaluated independently and I agree with the assessment and plan as dictated above.  [name], MD 05/31/17 1:13 PM

## 2017-05-30 ENCOUNTER — Encounter: Payer: Self-pay | Admitting: Radiation Oncology

## 2017-05-30 ENCOUNTER — Other Ambulatory Visit: Payer: Self-pay | Admitting: *Deleted

## 2017-05-30 ENCOUNTER — Ambulatory Visit
Admission: RE | Admit: 2017-05-30 | Discharge: 2017-05-30 | Disposition: A | Discharge status: Home / Self Care | Payer: Medicare HMO | Source: Ambulatory Visit | Attending: Radiation Oncology | Admitting: Radiation Oncology

## 2017-05-30 ENCOUNTER — Inpatient Hospital Stay: Payer: Medicare HMO | Attending: Oncology | Admitting: Oncology

## 2017-05-30 VITALS — BP 179/96 | HR 60 | Resp 20 | Wt 144.2 lb

## 2017-05-30 VITALS — BP 179/96 | HR 60 | Resp 20 | Wt 144.0 lb

## 2017-05-30 DIAGNOSIS — Z7982 Long term (current) use of aspirin: Secondary | ICD-10-CM | POA: Insufficient documentation

## 2017-05-30 DIAGNOSIS — I10 Essential (primary) hypertension: Secondary | ICD-10-CM

## 2017-05-30 DIAGNOSIS — E785 Hyperlipidemia, unspecified: Secondary | ICD-10-CM | POA: Diagnosis not present

## 2017-05-30 DIAGNOSIS — Z87891 Personal history of nicotine dependence: Secondary | ICD-10-CM | POA: Insufficient documentation

## 2017-05-30 DIAGNOSIS — Z923 Personal history of irradiation: Secondary | ICD-10-CM | POA: Insufficient documentation

## 2017-05-30 DIAGNOSIS — E119 Type 2 diabetes mellitus without complications: Secondary | ICD-10-CM | POA: Insufficient documentation

## 2017-05-30 DIAGNOSIS — C3412 Malignant neoplasm of upper lobe, left bronchus or lung: Secondary | ICD-10-CM

## 2017-05-30 DIAGNOSIS — R05 Cough: Secondary | ICD-10-CM | POA: Insufficient documentation

## 2017-05-30 DIAGNOSIS — Z85118 Personal history of other malignant neoplasm of bronchus and lung: Secondary | ICD-10-CM | POA: Insufficient documentation

## 2017-05-30 DIAGNOSIS — M25519 Pain in unspecified shoulder: Secondary | ICD-10-CM | POA: Diagnosis not present

## 2017-05-30 DIAGNOSIS — Z79899 Other long term (current) drug therapy: Secondary | ICD-10-CM | POA: Insufficient documentation

## 2017-05-30 DIAGNOSIS — I2510 Atherosclerotic heart disease of native coronary artery without angina pectoris: Secondary | ICD-10-CM | POA: Insufficient documentation

## 2017-05-30 DIAGNOSIS — M1990 Unspecified osteoarthritis, unspecified site: Secondary | ICD-10-CM | POA: Insufficient documentation

## 2017-05-30 DIAGNOSIS — Z8674 Personal history of sudden cardiac arrest: Secondary | ICD-10-CM | POA: Insufficient documentation

## 2017-05-30 NOTE — Progress Notes (Signed)
Radiation Oncology Follow up Note  Name: Scott Gross   Date:   05/30/2017 MRN:  [id number] DOB: 1934/10/02    This 83 y.o. male presents to the clinic today for four-month follow-up status post SB RT to left upper lobe for stage I adenocarcinoma.  REFERRING PROVIDER: [name], MD  HPI: Patient is an 83 year old male now out 4 months since completing SB RT for a stage I adenocarcinoma of the left upper lobe. Seen today in routine follow-up he is doing well specifically denies any side effects or complaints. He has mild nonproductive cough. Does complain of some shoulder pain although may be related to his sleeping pattern. He had a. CT scan back on August 9 showing evolving post radiation changes. There is some concern in the inferior portion of the region which may be resolving radiation changes versus residual or progressive disease.  COMPLICATIONS OF TREATMENT: none  FOLLOW UP COMPLIANCE: keeps appointments   PHYSICAL EXAM:  BP (!) 179/96   Pulse 60   Resp 20   Wt 144 lb 2.9 oz (65.4 kg)   BMI 20.69 kg/m  Well-developed well-nourished patient in NAD. HEENT reveals PERLA, EOMI, discs not visualized.  Oral cavity is clear. No oral mucosal lesions are identified. Neck is clear without evidence of cervical or supraclavicular adenopathy. Lungs are clear to A&P. Cardiac examination is essentially unremarkable with regular rate and rhythm without murmur rub or thrill. Abdomen is benign with no organomegaly or masses noted. Motor sensory and DTR levels are equal and symmetric in the upper and lower extremities. Cranial nerves II through XII are grossly intact. Proprioception is intact. No peripheral adenopathy or edema is identified. No motor or sensory levels are noted. Crude visual fields are within normal range.  RADIOLOGY RESULTS: CT scans are reviewed and compatible with the above-stated findings  PLAN: Present time patient is doing well with no evidence of disease and no  complaints. I've asked to see him back in 6 months for follow-up and ordered a CT scan with contrast of his chest prior to that visit. We certainly will keep an eye on the resolving nature of his lesion although on my review this is all probable radiation changes. Patient wife copy my treatment plan well.  I would like to take this opportunity to thank you for allowing me to participate in the care of your patient.Armstead Peaks., MD

## 2017-06-26 DIAGNOSIS — I2510 Atherosclerotic heart disease of native coronary artery without angina pectoris: Secondary | ICD-10-CM | POA: Diagnosis not present

## 2017-06-26 DIAGNOSIS — I739 Peripheral vascular disease, unspecified: Secondary | ICD-10-CM | POA: Diagnosis not present

## 2017-06-26 DIAGNOSIS — I10 Essential (primary) hypertension: Secondary | ICD-10-CM | POA: Diagnosis not present

## 2017-06-26 DIAGNOSIS — E785 Hyperlipidemia, unspecified: Secondary | ICD-10-CM | POA: Diagnosis not present

## 2017-06-26 DIAGNOSIS — I252 Old myocardial infarction: Secondary | ICD-10-CM | POA: Diagnosis not present

## 2017-06-26 DIAGNOSIS — R9431 Abnormal electrocardiogram [ECG] [EKG]: Secondary | ICD-10-CM | POA: Diagnosis not present

## 2017-06-26 DIAGNOSIS — E784 Other hyperlipidemia: Secondary | ICD-10-CM | POA: Diagnosis not present

## 2017-06-26 DIAGNOSIS — I208 Other forms of angina pectoris: Secondary | ICD-10-CM | POA: Diagnosis not present

## 2017-06-26 DIAGNOSIS — E119 Type 2 diabetes mellitus without complications: Secondary | ICD-10-CM | POA: Diagnosis not present

## 2017-06-27 DIAGNOSIS — E119 Type 2 diabetes mellitus without complications: Secondary | ICD-10-CM | POA: Diagnosis not present

## 2017-06-27 DIAGNOSIS — I10 Essential (primary) hypertension: Secondary | ICD-10-CM | POA: Diagnosis not present

## 2017-06-27 DIAGNOSIS — E785 Hyperlipidemia, unspecified: Secondary | ICD-10-CM | POA: Diagnosis not present

## 2017-07-04 DIAGNOSIS — E119 Type 2 diabetes mellitus without complications: Secondary | ICD-10-CM | POA: Diagnosis not present

## 2017-07-04 DIAGNOSIS — I10 Essential (primary) hypertension: Secondary | ICD-10-CM | POA: Diagnosis not present

## 2017-07-04 DIAGNOSIS — Z0000 Encounter for general adult medical examination without abnormal findings: Secondary | ICD-10-CM | POA: Diagnosis not present

## 2017-07-04 DIAGNOSIS — E785 Hyperlipidemia, unspecified: Secondary | ICD-10-CM | POA: Diagnosis not present

## 2017-08-28 ENCOUNTER — Ambulatory Visit
Admission: RE | Admit: 2017-08-28 | Discharge: 2017-08-28 | Disposition: A | Discharge status: Home / Self Care | Payer: Medicare HMO | Source: Ambulatory Visit | Attending: Nurse Practitioner | Admitting: Nurse Practitioner

## 2017-08-28 DIAGNOSIS — J432 Centrilobular emphysema: Secondary | ICD-10-CM | POA: Insufficient documentation

## 2017-08-28 DIAGNOSIS — I2510 Atherosclerotic heart disease of native coronary artery without angina pectoris: Secondary | ICD-10-CM | POA: Insufficient documentation

## 2017-08-28 DIAGNOSIS — C3412 Malignant neoplasm of upper lobe, left bronchus or lung: Secondary | ICD-10-CM | POA: Diagnosis not present

## 2017-08-28 DIAGNOSIS — I700 Atherosclerosis of aorta: Secondary | ICD-10-CM | POA: Diagnosis not present

## 2017-08-28 DIAGNOSIS — Z923 Personal history of irradiation: Secondary | ICD-10-CM | POA: Insufficient documentation

## 2017-08-28 DIAGNOSIS — R918 Other nonspecific abnormal finding of lung field: Secondary | ICD-10-CM | POA: Diagnosis not present

## 2017-08-28 LAB — POCT I-STAT CREATININE: CREATININE: 1 mg/dL (ref 0.61–1.24)

## 2017-08-28 MED ORDER — IOPAMIDOL (ISOVUE-300) INJECTION 61%
75.0000 mL | Freq: Once | INTRAVENOUS | Status: AC | PRN
  Administered 2017-08-28: 75 mL via INTRAVENOUS

## 2017-08-31 NOTE — Progress Notes (Signed)
[facility]  Telephone:([phone]) [phone] Fax:(336) [phone]  ID: Scott BORIN OB: September 23, 1934  MR#: [id number]  XLK#:[id number]  Patient Care Team: [name], MD as PCP - General (Family Medicine)  CHIEF COMPLAINT: Adenocarcinoma of upper lobe of left lung.  INTERVAL HISTORY: Patient returns to clinic today for further evaluation and discussion of his imaging results.  He continues to feel well and is asymptomatic. He has no neurologic complaints. He denies any chest pain, cough, or shortness of breath today. He has a good appetite and denies weight loss. He denies any recent fevers or illnesses. He has no nausea, vomiting, constipation, or diarrhea. He has no urinary complaints. Patient offers no specific complaints today.  REVIEW OF SYSTEMS:   Review of Systems  Constitutional: Negative.  Negative for fever, malaise/fatigue and weight loss.  Respiratory: Negative for cough, hemoptysis and shortness of breath.   Cardiovascular: Negative.  Negative for chest pain and leg swelling.  Gastrointestinal: Negative.  Negative for abdominal pain.  Genitourinary: Negative.   Musculoskeletal: Negative.  Negative for joint pain.  Skin: Negative.  Negative for rash.  Neurological: Negative.  Negative for weakness.  Psychiatric/Behavioral: Negative.  The patient is not nervous/anxious.     As per HPI. Otherwise, a complete review of systems is negative.  PAST MEDICAL HISTORY: Past Medical History:  Diagnosis Date  . Arthritis   . CAD (coronary artery disease) Nov. 12, 2012   Inferior ST elevation MI in November of 2012 with ventricular fibrillation arrest. Status post drug-eluting stent placement to the mid LAD. Most recent cardiac catheterization in June of this year showed patent stent with minimal restenosis, 75% proximal LAD and 80% distal LAD which were unchanged from before. Mild disease in the left circumflex and moderate RCA disease. Ejection fraction was 55%.  .  Cardiac arrest [facility]) Nov. 2012   x 2   . Cardiac arrest - ventricular fibrillation   . Diabetes mellitus without complication ([place])    Diet Controlled  . HOH (hard of hearing)    Left Hearing Aid  . Hyperlipidemia   . Hypertension   . Post PTCA 2013   x2  . ST elevation MI (STEMI) ([place]) 08/26/2011    PAST SURGICAL HISTORY: Past Surgical History:  Procedure Laterality Date  . CARDIAC CATHETERIZATION  2013   @ [place]; [facility] s/p stent   . CORONARY ANGIOPLASTY    . ELECTROMAGNETIC NAVIGATION BRONCHOSCOPY N/A 11/27/2016   Performed by [name], MD at [facility] ORS    FAMILY HISTORY: Family History  Problem Relation Age of Onset  . Heart attack Mother   . Heart attack Father     ADVANCED DIRECTIVES (Y/N):  N  HEALTH MAINTENANCE: Social History   Tobacco Use  . Smoking status: Former Smoker    Packs/day: 1.00    Types: Cigarettes    Last attempt to quit: 08/27/2011    Years since quitting: 6.0  . Smokeless tobacco: Never Used  Substance Use Topics  . Alcohol use: No  . Drug use: No     Colonoscopy:  PAP:  Bone density:  Lipid panel:  Allergies  Allergen Reactions  . Statins Other (See Comments)    Arthralagia    Current Outpatient Medications  Medication Sig Dispense Refill  . amLODipine (NORVASC) 5 MG tablet Take 5 mg by mouth as needed. For systolic pressure greater than 150    . aspirin 81 MG tablet Take 81 mg by mouth daily. Patient stopped on November 22, 2016 for  procedure    . clopidogrel (PLAVIX) 75 MG tablet TAKE 1 TABLET EVERY DAY    . lisinopril (PRINIVIL,ZESTRIL) 20 MG tablet Take 20 mg by mouth daily.    . metoprolol tartrate (LOPRESSOR) 25 MG tablet Take 25 mg daily by mouth.      No current facility-administered medications for this visit.     OBJECTIVE: Vitals:   09/02/17 1142  BP: (!) 160/93  Pulse: 68  Resp: 18  Temp: 97.9 F (36.6 C)     Body mass index is 20.73 kg/m.    ECOG FS:0 - Asymptomatic  General: Well-developed,  well-nourished, no acute distress. Eyes: Pink conjunctiva, anicteric sclera. Lungs: Clear to auscultation bilaterally. Heart: Regular rate and rhythm. No rubs, murmurs, or gallops. Abdomen: Soft, nontender, nondistended. No organomegaly noted, normoactive bowel sounds. Musculoskeletal: No edema, cyanosis, or clubbing. Neuro: Alert, answering all questions appropriately. Cranial nerves grossly intact. Skin: No rashes or petechiae noted. Psych: Normal affect.   LAB RESULTS:  Lab Results  Component Value Date   NA 137 09/03/2016   K 3.9 11/23/2016   CL 101 09/03/2016   CO2 29 09/03/2016   GLUCOSE 179 (H) 09/03/2016   BUN 11 09/03/2016   CREATININE 1.00 08/28/2017   CALCIUM 8.8 (L) 09/03/2016   PROT 7.4 11/28/2013   ALBUMIN 3.9 11/28/2013   AST 33 11/28/2013   ALT 36 11/28/2013   ALKPHOS 99 11/28/2013   BILITOT 0.6 11/28/2013   GFRNONAA >60 09/03/2016   GFRAA >60 09/03/2016    Lab Results  Component Value Date   WBC 17.3 (H) 09/03/2016   NEUTROABS 6.0 11/28/2013   HGB 16.1 09/03/2016   HCT 46.7 09/03/2016   MCV 89.6 09/03/2016   PLT 167 09/03/2016     STUDIES: Ct Chest W Contrast  Result Date: 08/28/2017 CLINICAL DATA:  Left upper lobe lung cancer. Ex-smoker, quitting 6 years ago. Adenocarcinoma primary. Status post radiation therapy completing in May. EXAM: CT CHEST WITH CONTRAST TECHNIQUE: Multidetector CT imaging of the chest was performed during intravenous contrast administration. CONTRAST:  58mL ISOVUE-300 IOPAMIDOL (ISOVUE-300) INJECTION 61% COMPARISON:  05/23/2017 FINDINGS: Cardiovascular: Aortic and branch vessel atherosclerosis. Tortuous thoracic aorta. Normal heart size, without pericardial effusion. Lad and probable right coronary artery atherosclerosis. No central pulmonary embolism, on this non-dedicated study. Mediastinum/Nodes: No supraclavicular adenopathy. Multiple small middle mediastinal and prevascular nodes are not significantly changed. None are  pathologic by size criteria. No hilar adenopathy. Left suprahilar nodal tissue is similar at 6 mm on image 59/series 2. Lungs/Pleura: No pleural fluid.  Mild centrilobular emphysema. The left upper lobe 6 mm nodule described on the prior exam has resolved. Left upper lobe radiation induced consolidation is slightly more confluent today. This includes the area of ground-glass detailed previously, including on image 41/series 3. Upper Abdomen: Normal imaged portions of the liver, spleen, stomach, pancreas, adrenal glands, kidneys. There is occlusion of the celiac origin on image 168/series 2. Reconstitution via pancreatic/duodenal arcade collaterals. This is likely chronic, but not well evaluated on prior exams. Musculoskeletal: Mild thoracic spondylosis. Subtle superior endplate irregularity at T4 is similar. Remote sternal body fracture. IMPRESSION: 1. Evolving radiation change within the left upper lobe. No evidence of recurrent or metastatic disease. 2. 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 Most likely chronic. 3. Coronary artery atherosclerosis. Aortic Atherosclerosis (ICD10-I70.0). 4.  Emphysema (ICD10-J43.9). Electronically Signed   By: Abigail Miyamoto M.D.   On: 08/28/2017 11:37    ASSESSMENT:  Adenocarcinoma of upper lobe of left lung  PLAN:   1.  Adenocarcinoma of upper lobe of left lung: Biopsy of lung nodule on November 27, 2016 revealed adenocarcinoma, but there was no invasive component on the sample. Biopsy of mediastinal lymph node had insufficient material. Patient ultimately declined any further biopsies or surgery and elected to proceed with XRT alone which he completed in approximately April 2018.  CT scan results from August 28, 2017 reviewed independently and report as above with no obvious evidence of recurrent or metastatic disease.  No intervention is needed at this time.  Return to clinic in 6 months with repeat imaging and further  evaluation. 2. Hypertension: Patient's blood pressure is significantly elevated today. Continue evaluation and treatment for primary care.  Approximately 20 minutes was spent in discussion of which greater than 50% was consultation.  Patient expressed understanding and was in agreement with this plan. He also understands that He can call clinic at any time with any questions, concerns, or complaints.   Cancer Staging No matching staging information was found for the patient.  [name], MD   09/02/2017 1:17 PM

## 2017-09-02 ENCOUNTER — Inpatient Hospital Stay: Payer: Medicare HMO | Attending: Oncology | Admitting: Oncology

## 2017-09-02 VITALS — BP 160/93 | HR 68 | Temp 97.9°F | Resp 18 | Wt 144.5 lb

## 2017-09-02 DIAGNOSIS — Z87891 Personal history of nicotine dependence: Secondary | ICD-10-CM | POA: Diagnosis not present

## 2017-09-02 DIAGNOSIS — Z7982 Long term (current) use of aspirin: Secondary | ICD-10-CM | POA: Insufficient documentation

## 2017-09-02 DIAGNOSIS — E119 Type 2 diabetes mellitus without complications: Secondary | ICD-10-CM | POA: Insufficient documentation

## 2017-09-02 DIAGNOSIS — I2510 Atherosclerotic heart disease of native coronary artery without angina pectoris: Secondary | ICD-10-CM | POA: Insufficient documentation

## 2017-09-02 DIAGNOSIS — Z8674 Personal history of sudden cardiac arrest: Secondary | ICD-10-CM | POA: Insufficient documentation

## 2017-09-02 DIAGNOSIS — E785 Hyperlipidemia, unspecified: Secondary | ICD-10-CM | POA: Insufficient documentation

## 2017-09-02 DIAGNOSIS — C3412 Malignant neoplasm of upper lobe, left bronchus or lung: Secondary | ICD-10-CM

## 2017-09-02 DIAGNOSIS — I10 Essential (primary) hypertension: Secondary | ICD-10-CM | POA: Insufficient documentation

## 2017-09-02 DIAGNOSIS — Z923 Personal history of irradiation: Secondary | ICD-10-CM | POA: Diagnosis not present

## 2017-09-02 DIAGNOSIS — M1990 Unspecified osteoarthritis, unspecified site: Secondary | ICD-10-CM | POA: Insufficient documentation

## 2017-09-02 DIAGNOSIS — Z79899 Other long term (current) drug therapy: Secondary | ICD-10-CM | POA: Insufficient documentation

## 2017-09-02 DIAGNOSIS — J439 Emphysema, unspecified: Secondary | ICD-10-CM

## 2017-12-03 DIAGNOSIS — I2510 Atherosclerotic heart disease of native coronary artery without angina pectoris: Secondary | ICD-10-CM | POA: Diagnosis not present

## 2017-12-03 DIAGNOSIS — E785 Hyperlipidemia, unspecified: Secondary | ICD-10-CM | POA: Diagnosis not present

## 2017-12-03 DIAGNOSIS — I10 Essential (primary) hypertension: Secondary | ICD-10-CM | POA: Diagnosis not present

## 2017-12-03 DIAGNOSIS — E119 Type 2 diabetes mellitus without complications: Secondary | ICD-10-CM | POA: Diagnosis not present

## 2017-12-03 DIAGNOSIS — Z79899 Other long term (current) drug therapy: Secondary | ICD-10-CM | POA: Diagnosis not present

## 2017-12-05 ENCOUNTER — Ambulatory Visit
Admission: RE | Admit: 2017-12-05 | Discharge: 2017-12-05 | Disposition: A | Discharge status: Home / Self Care | Payer: Medicare HMO | Source: Ambulatory Visit | Attending: Radiation Oncology | Admitting: Radiation Oncology

## 2017-12-05 DIAGNOSIS — J439 Emphysema, unspecified: Secondary | ICD-10-CM | POA: Insufficient documentation

## 2017-12-05 DIAGNOSIS — C3490 Malignant neoplasm of unspecified part of unspecified bronchus or lung: Secondary | ICD-10-CM | POA: Diagnosis not present

## 2017-12-05 DIAGNOSIS — I2510 Atherosclerotic heart disease of native coronary artery without angina pectoris: Secondary | ICD-10-CM | POA: Insufficient documentation

## 2017-12-05 DIAGNOSIS — I700 Atherosclerosis of aorta: Secondary | ICD-10-CM | POA: Diagnosis not present

## 2017-12-05 DIAGNOSIS — C3412 Malignant neoplasm of upper lobe, left bronchus or lung: Secondary | ICD-10-CM | POA: Insufficient documentation

## 2017-12-05 LAB — POCT I-STAT CREATININE: Creatinine, Ser: 1 mg/dL (ref 0.61–1.24)

## 2017-12-05 MED ORDER — IOPAMIDOL (ISOVUE-300) INJECTION 61%
75.0000 mL | Freq: Once | INTRAVENOUS | Status: AC | PRN
  Administered 2017-12-05: 75 mL via INTRAVENOUS

## 2017-12-12 ENCOUNTER — Encounter: Payer: Self-pay | Admitting: Radiation Oncology

## 2017-12-12 ENCOUNTER — Other Ambulatory Visit: Payer: Self-pay | Admitting: *Deleted

## 2017-12-12 ENCOUNTER — Ambulatory Visit
Admission: RE | Admit: 2017-12-12 | Discharge: 2017-12-12 | Disposition: A | Discharge status: Home / Self Care | Payer: Medicare HMO | Source: Ambulatory Visit | Attending: Radiation Oncology | Admitting: Radiation Oncology

## 2017-12-12 ENCOUNTER — Other Ambulatory Visit: Payer: Self-pay

## 2017-12-12 VITALS — BP 145/82 | HR 65 | Temp 95.2°F | Resp 20 | Wt 144.7 lb

## 2017-12-12 DIAGNOSIS — Z87891 Personal history of nicotine dependence: Secondary | ICD-10-CM | POA: Diagnosis not present

## 2017-12-12 DIAGNOSIS — Z85118 Personal history of other malignant neoplasm of bronchus and lung: Secondary | ICD-10-CM | POA: Diagnosis not present

## 2017-12-12 DIAGNOSIS — C3412 Malignant neoplasm of upper lobe, left bronchus or lung: Secondary | ICD-10-CM

## 2017-12-12 NOTE — Progress Notes (Signed)
Radiation Oncology Follow up Note  Name: Scott Gross   Date:   12/12/2017 MRN:  [id number] DOB: 1934-09-07    This 84 y.o. male presents to the clinic today for 10 month follow-up status post SB RT to his left upper lobe for stage I adenocarcinoma.  REFERRING PROVIDER: [name], MD  HPI: Patient is an 84 year old male now out 10 months having completed SB RT to his left upper lobe for stage I adenocarcinoma. Seen today in routine follow-up he is doing well. He specifically denies cough hemoptysis or chest tightness. He's having no dysphagia. He recently had a CT scan of his chest. Which I have reviewed showing radiation changes in left upper lobe although not typical of evolving radiation change and repeat CT scan in 12 weeks was recommended.  COMPLICATIONS OF TREATMENT: none  FOLLOW UP COMPLIANCE: keeps appointments   PHYSICAL EXAM:  BP (!) 145/82   Pulse 65   Temp (!) 95.2 F (35.1 C)   Resp 20   Wt 144 lb 11.7 oz (65.6 kg)   BMI 20.77 kg/m  Well-developed well-nourished patient in NAD. HEENT reveals PERLA, EOMI, discs not visualized.  Oral cavity is clear. No oral mucosal lesions are identified. Neck is clear without evidence of cervical or supraclavicular adenopathy. Lungs are clear to A&P. Cardiac examination is essentially unremarkable with regular rate and rhythm without murmur rub or thrill. Abdomen is benign with no organomegaly or masses noted. Motor sensory and DTR levels are equal and symmetric in the upper and lower extremities. Cranial nerves II through XII are grossly intact. Proprioception is intact. No peripheral adenopathy or edema is identified. No motor or sensory levels are noted. Crude visual fields are within normal range.  RADIOLOGY RESULTS: CT scan reviewed  PLAN: At this time I believe we are dealing with radiation changes I will present his case at our weekly tumor conference and review with radiology. He is scheduled for follow-up CT scan in May and  I will see him shortly thereafter for follow-up. If that is stable will start once your follow-up appointment. Other rectal irrigations will be made based on results of that CT scan. He continues close follow-up care with medical oncology.  I would like to take this opportunity to thank you for allowing me to participate in the care of your patient.Noreene Filbert, MD

## 2017-12-25 DIAGNOSIS — I701 Atherosclerosis of renal artery: Secondary | ICD-10-CM | POA: Diagnosis not present

## 2017-12-25 DIAGNOSIS — I208 Other forms of angina pectoris: Secondary | ICD-10-CM | POA: Diagnosis not present

## 2017-12-25 DIAGNOSIS — E7849 Other hyperlipidemia: Secondary | ICD-10-CM | POA: Diagnosis not present

## 2017-12-25 DIAGNOSIS — I10 Essential (primary) hypertension: Secondary | ICD-10-CM | POA: Diagnosis not present

## 2017-12-25 DIAGNOSIS — I209 Angina pectoris, unspecified: Secondary | ICD-10-CM | POA: Diagnosis not present

## 2017-12-25 DIAGNOSIS — E785 Hyperlipidemia, unspecified: Secondary | ICD-10-CM | POA: Diagnosis not present

## 2017-12-25 DIAGNOSIS — I2510 Atherosclerotic heart disease of native coronary artery without angina pectoris: Secondary | ICD-10-CM | POA: Diagnosis not present

## 2017-12-25 DIAGNOSIS — R9431 Abnormal electrocardiogram [ECG] [EKG]: Secondary | ICD-10-CM | POA: Diagnosis not present

## 2017-12-25 DIAGNOSIS — E119 Type 2 diabetes mellitus without complications: Secondary | ICD-10-CM | POA: Diagnosis not present

## 2017-12-25 DIAGNOSIS — I739 Peripheral vascular disease, unspecified: Secondary | ICD-10-CM | POA: Diagnosis not present

## 2018-01-22 ENCOUNTER — Telehealth: Payer: Self-pay | Admitting: Internal Medicine

## 2018-01-22 DIAGNOSIS — E7849 Other hyperlipidemia: Secondary | ICD-10-CM | POA: Diagnosis not present

## 2018-01-22 DIAGNOSIS — I10 Essential (primary) hypertension: Secondary | ICD-10-CM | POA: Diagnosis not present

## 2018-01-22 DIAGNOSIS — C3412 Malignant neoplasm of upper lobe, left bronchus or lung: Secondary | ICD-10-CM | POA: Diagnosis not present

## 2018-01-22 DIAGNOSIS — I2510 Atherosclerotic heart disease of native coronary artery without angina pectoris: Secondary | ICD-10-CM | POA: Diagnosis not present

## 2018-01-22 DIAGNOSIS — I208 Other forms of angina pectoris: Secondary | ICD-10-CM | POA: Diagnosis not present

## 2018-01-22 DIAGNOSIS — I252 Old myocardial infarction: Secondary | ICD-10-CM | POA: Diagnosis not present

## 2018-01-22 DIAGNOSIS — E119 Type 2 diabetes mellitus without complications: Secondary | ICD-10-CM | POA: Diagnosis not present

## 2018-01-22 DIAGNOSIS — I739 Peripheral vascular disease, unspecified: Secondary | ICD-10-CM | POA: Diagnosis not present

## 2018-01-22 DIAGNOSIS — R9431 Abnormal electrocardiogram [ECG] [EKG]: Secondary | ICD-10-CM | POA: Diagnosis not present

## 2018-01-22 NOTE — Telephone Encounter (Signed)
3 attempts to schedule fu appt from recall list.  Mailed Letter. Deleting recall.

## 2018-01-24 ENCOUNTER — Other Ambulatory Visit: Payer: Self-pay | Admitting: *Deleted

## 2018-01-30 DIAGNOSIS — I252 Old myocardial infarction: Secondary | ICD-10-CM | POA: Diagnosis not present

## 2018-01-30 DIAGNOSIS — C3412 Malignant neoplasm of upper lobe, left bronchus or lung: Secondary | ICD-10-CM | POA: Diagnosis not present

## 2018-01-30 DIAGNOSIS — E119 Type 2 diabetes mellitus without complications: Secondary | ICD-10-CM | POA: Diagnosis not present

## 2018-01-30 DIAGNOSIS — I208 Other forms of angina pectoris: Secondary | ICD-10-CM | POA: Diagnosis not present

## 2018-01-30 DIAGNOSIS — I2510 Atherosclerotic heart disease of native coronary artery without angina pectoris: Secondary | ICD-10-CM | POA: Diagnosis not present

## 2018-01-30 DIAGNOSIS — R9431 Abnormal electrocardiogram [ECG] [EKG]: Secondary | ICD-10-CM | POA: Diagnosis not present

## 2018-01-30 DIAGNOSIS — I10 Essential (primary) hypertension: Secondary | ICD-10-CM | POA: Diagnosis not present

## 2018-01-30 DIAGNOSIS — I739 Peripheral vascular disease, unspecified: Secondary | ICD-10-CM | POA: Diagnosis not present

## 2018-01-30 DIAGNOSIS — E7849 Other hyperlipidemia: Secondary | ICD-10-CM | POA: Diagnosis not present

## 2018-02-10 ENCOUNTER — Other Ambulatory Visit: Payer: Self-pay

## 2018-02-10 ENCOUNTER — Emergency Department
Admission: EM | Admit: 2018-02-10 | Discharge: 2018-02-10 | Disposition: A | Discharge status: Home / Self Care | Payer: Medicare HMO | Attending: Emergency Medicine | Admitting: Emergency Medicine

## 2018-02-10 ENCOUNTER — Emergency Department: Payer: Medicare HMO

## 2018-02-10 DIAGNOSIS — Z9861 Coronary angioplasty status: Secondary | ICD-10-CM | POA: Diagnosis not present

## 2018-02-10 DIAGNOSIS — Z87891 Personal history of nicotine dependence: Secondary | ICD-10-CM | POA: Insufficient documentation

## 2018-02-10 DIAGNOSIS — I2510 Atherosclerotic heart disease of native coronary artery without angina pectoris: Secondary | ICD-10-CM | POA: Diagnosis not present

## 2018-02-10 DIAGNOSIS — Z79899 Other long term (current) drug therapy: Secondary | ICD-10-CM | POA: Diagnosis not present

## 2018-02-10 DIAGNOSIS — E119 Type 2 diabetes mellitus without complications: Secondary | ICD-10-CM | POA: Diagnosis not present

## 2018-02-10 DIAGNOSIS — Z7902 Long term (current) use of antithrombotics/antiplatelets: Secondary | ICD-10-CM | POA: Diagnosis not present

## 2018-02-10 DIAGNOSIS — I252 Old myocardial infarction: Secondary | ICD-10-CM | POA: Diagnosis not present

## 2018-02-10 DIAGNOSIS — I10 Essential (primary) hypertension: Secondary | ICD-10-CM | POA: Diagnosis not present

## 2018-02-10 DIAGNOSIS — Z85118 Personal history of other malignant neoplasm of bronchus and lung: Secondary | ICD-10-CM | POA: Diagnosis not present

## 2018-02-10 DIAGNOSIS — R079 Chest pain, unspecified: Secondary | ICD-10-CM | POA: Diagnosis not present

## 2018-02-10 LAB — CBC
HEMATOCRIT: 44 % (ref 40.0–52.0)
Hemoglobin: 15.4 g/dL (ref 13.0–18.0)
MCH: 32.1 pg (ref 26.0–34.0)
MCHC: 35.1 g/dL (ref 32.0–36.0)
MCV: 91.7 fL (ref 80.0–100.0)
Platelets: 200 10*3/uL (ref 150–440)
RBC: 4.79 MIL/uL (ref 4.40–5.90)
RDW: 13.3 % (ref 11.5–14.5)
WBC: 8.7 10*3/uL (ref 3.8–10.6)

## 2018-02-10 LAB — BASIC METABOLIC PANEL
Anion gap: 8 (ref 5–15)
BUN: 9 mg/dL (ref 6–20)
CALCIUM: 9 mg/dL (ref 8.9–10.3)
CO2: 29 mmol/L (ref 22–32)
Chloride: 95 mmol/L — ABNORMAL LOW (ref 101–111)
Creatinine, Ser: 0.8 mg/dL (ref 0.61–1.24)
GFR calc Af Amer: >60 mL/min (ref >60)
GLUCOSE: 139 mg/dL — AB (ref 65–99)
Potassium: 4 mmol/L (ref 3.5–5.1)
Sodium: 132 mmol/L — ABNORMAL LOW (ref 135–145)

## 2018-02-10 LAB — TROPONIN I

## 2018-02-10 MED ORDER — AMLODIPINE BESYLATE 5 MG PO TABS
5.0000 mg | ORAL_TABLET | Freq: Once | ORAL | Status: AC
  Administered 2018-02-10: 5 mg via ORAL
  Filled 2018-02-10: qty 1

## 2018-02-10 MED ORDER — HYDROCHLOROTHIAZIDE 25 MG PO TABS
12.5000 mg | ORAL_TABLET | Freq: Once | ORAL | Status: AC
  Administered 2018-02-10: 12.5 mg via ORAL
  Filled 2018-02-10: qty 1

## 2018-02-10 NOTE — ED Triage Notes (Signed)
Pt c/o b/p 200/100's today with HA and left sided chest pain today. States they recently changed a lot of his medications about 2 weeks ago .Marland Kitchen

## 2018-02-10 NOTE — ED Notes (Signed)
Patient transported to X-ray 

## 2018-02-10 NOTE — Discharge Instructions (Addendum)
You have been seen in the emergency department today for chest pain and high blood pressure. Your workup has shown normal results. As we discussed please follow-up with your primary care physician in the next 1-2 days for recheck. Return to the emergency department for any further chest pain, trouble breathing, or any other symptom personally concerning to yourself.

## 2018-02-10 NOTE — ED Provider Notes (Signed)
[facility] Emergency Department Provider Note  Time seen: 4:33 PM  I have reviewed the triage vital signs and the nursing notes.   HISTORY  Chief Complaint Hypertension    HPI Scott Gross is a 84 y.o. male with a past medical history of cardiac arrest, diabetes, hypertension, hyperlipidemia, MI status post stent in 2012, presents to the emergency department for high blood pressure and chest discomfort.  According to the patient he has been having issues with his blood pressure over the past several weeks to months.  He is seeing his cardiologist as well as primary care doctor regarding this.  His cardiologist made recent changes to his medications (doubled his hydrochlorothiazide from 12.5-25 daily, doubled his amlodipine from 5 mg to 10 mg daily) however they have not started these medications as they are waiting for their prescription to arrive in the mail.  Patient is currently taking his prior medications.  Patient states they are planning to do a scan of his renal arteries to ensure that he does not have narrowing of the renal arteries but he is not sure when this scan will occur.  Patient states he was having a very brief episode of chest discomfort in the left upper chest approximately 2 hours ago, but states it only lasted seconds and this is the same area that he received radiation treatments for a nodule on his lung recently.  Denies any nausea diaphoresis or shortness of breath.  Currently appears very well, largely negative review of systems.   Past Medical History:  Diagnosis Date  . Arthritis   . CAD (coronary artery disease) Nov. 12, 2012   Inferior ST elevation MI in November of 2012 with ventricular fibrillation arrest. Status post drug-eluting stent placement to the mid LAD. Most recent cardiac catheterization in June of this year showed patent stent with minimal restenosis, 75% proximal LAD and 80% distal LAD which were unchanged from before. Mild  disease in the left circumflex and moderate RCA disease. Ejection fraction was 55%.  . Cardiac arrest [facility]) Nov. 2012   x 2   . Cardiac arrest - ventricular fibrillation   . Diabetes mellitus without complication ([place])    Diet Controlled  . HOH (hard of hearing)    Left Hearing Aid  . Hyperlipidemia   . Hypertension   . Post PTCA 2013   x2  . ST elevation MI (STEMI) ([place]) 08/26/2011    Patient Active Problem List   Diagnosis Date Noted  . Cardiac arrest ([place])   . Diabetes mellitus type 2, uncomplicated ([place]) 24/26/8341  . Angina pectoris ([place]) 12/17/2016  . Adenopathy   . Cancer of upper lobe of left lung ([place]) 09/10/2016  . Hyperlipidemia   . Hypertension   . CAD (coronary artery disease) 08/27/2011  . Myocardial infarction ([place]) 08/16/2011    Past Surgical History:  Procedure Laterality Date  . CARDIAC CATHETERIZATION  2013   @ [place]; [facility] s/p stent   . CORONARY ANGIOPLASTY    . ELECTROMAGNETIC NAVIGATION BROCHOSCOPY N/A 11/27/2016   Procedure: ELECTROMAGNETIC NAVIGATION BRONCHOSCOPY;  Surgeon: [name], MD;  Location: [facility] ORS;  Service: Cardiopulmonary;  Laterality: N/A;    Prior to Admission medications   Medication Sig Start Date End Date Taking? Authorizing Provider  amLODipine (NORVASC) 5 MG tablet Take 5 mg by mouth as needed. For systolic pressure greater than 150    [provider]  aspirin 81 MG tablet Take 81 mg by mouth daily. Patient stopped on November 22, 2016 for procedure    [provider]  clopidogrel (PLAVIX) 75 MG tablet TAKE 1 TABLET EVERY DAY 09/10/16   [provider]  hydrochlorothiazide (MICROZIDE) 12.5 MG capsule  12/03/17   [provider]  lisinopril (PRINIVIL,ZESTRIL) 20 MG tablet Take 20 mg by mouth daily.    [provider]  metoprolol tartrate (LOPRESSOR) 25 MG tablet Take 25 mg daily by mouth.     [provider]    Allergies  Allergen Reactions  . Statins Other (See Comments)     Arthralagia    Family History  Problem Relation Age of Onset  . Heart attack Mother   . Heart attack Father     Social History Social History   Tobacco Use  . Smoking status: Former Smoker    Packs/day: 1.00    Types: Cigarettes    Last attempt to quit: 08/27/2011    Years since quitting: 6.4  . Smokeless tobacco: Never Used  Substance Use Topics  . Alcohol use: No  . Drug use: No    Review of Systems Constitutional: Negative for fever. Eyes: Negative for visual complaints ENT: Negative for recent illness/congestion Cardiovascular: Brief left upper chest discomfort several hours ago now resolved Respiratory: Negative for shortness of breath. Gastrointestinal: Negative for abdominal pain, vomiting  Genitourinary: Negative for urinary compaints Musculoskeletal: Negative for leg pain or swelling. Skin: Negative for skin complaints  Neurological: Negative for headache All other ROS negative  ____________________________________________   PHYSICAL EXAM:  VITAL SIGNS: ED Triage Vitals  Enc Vitals Group     BP 02/10/18 1605 (!) 183/105     Pulse Rate 02/10/18 1605 79     Resp 02/10/18 1605 16     Temp 02/10/18 1605 98.2 F (36.8 C)     Temp Source 02/10/18 1605 Oral     SpO2 02/10/18 1605 97 %     Weight 02/10/18 1606 144 lb (65.3 kg)     Height 02/10/18 1606 5\' 11"  (1.803 m)     Head Circumference --      Peak Flow --      Pain Score 02/10/18 1606 0     Pain Loc --      Pain Edu? --      Excl. in [place]? --    Constitutional: Alert and oriented. Well appearing and in no distress. Eyes: Normal exam ENT   Head: Normocephalic and atraumatic   Mouth/Throat: Mucous membranes are moist. Cardiovascular: Normal rate, regular rhythm Respiratory: Normal respiratory effort without tachypnea nor retractions. Breath sounds are clear Gastrointestinal: Soft and nontender. No distention.   Musculoskeletal: Nontender with normal range of motion in all extremities.  No lower extremity tenderness or edema. Neurologic:  Normal speech and language. No gross focal neurologic deficits Skin:  Skin is warm, dry and intact.  Psychiatric: Mood and affect are normal.   ____________________________________________    EKG  EKG reviewed and interpreted by myself shows normal sinus rhythm at 73 bpm with a narrow QRS, normal axis, normal intervals, no concerning ST changes.  ____________________________________________    RADIOLOGY  Chest x-ray shows redemonstration of upper lobe mass.  ____________________________________________   INITIAL IMPRESSION / ASSESSMENT AND PLAN / ED COURSE  Pertinent labs & imaging results that were available during my care of the patient were reviewed by me and considered in my medical decision making (see chart for details).  Patient presents emergency department for elevated blood pressure 200/100 sent home as well as brief left chest  discomfort.  Differential would include ACS, uncontrolled hypertension, renal artery stenosis, renal insufficiency.  We will check labs including troponin and renal function.  Patient's EKG is reassuring, chest x-ray pending.  Patient states the chest pain he experienced was very brief lasting only seconds and was in the area that he received radiation treatments for prior lung nodule several months ago.  We will dose the patient's additional 5 mg of amlodipine and 12.5 mg of hydrochlorothiazide as ordered by his cardiologist.  We will place the patient on cardiac monitoring and continue to monitor throughout his emergency department stay.  X-ray continues to show a left upper lobe mass.  Labs are reassuring including normal renal function as well as negative troponin.  Patient continues to appear very well in the emergency department.  Current blood pressure 158/96.  Patient will follow up with cardiology for renal artery scan, wife states she will call regarding this.  I discussed with the patient  and family starting his new medications as prescribed as soon as they receive them which the wife states should be today or tomorrow.  Overall patient appears well, no distress will discharge home. ____________________________________________   FINAL CLINICAL IMPRESSION(S) / ED DIAGNOSES  Chest pain Hypertension    Harvest Dark, MD 02/10/18 [phone]

## 2018-02-14 ENCOUNTER — Other Ambulatory Visit: Payer: Self-pay | Admitting: Internal Medicine

## 2018-02-14 DIAGNOSIS — I701 Atherosclerosis of renal artery: Secondary | ICD-10-CM

## 2018-02-19 ENCOUNTER — Ambulatory Visit
Admission: RE | Admit: 2018-02-19 | Discharge: 2018-02-19 | Disposition: A | Discharge status: Home / Self Care | Payer: Medicare HMO | Source: Ambulatory Visit | Attending: Internal Medicine | Admitting: Internal Medicine

## 2018-02-19 DIAGNOSIS — I701 Atherosclerosis of renal artery: Secondary | ICD-10-CM | POA: Insufficient documentation

## 2018-02-27 ENCOUNTER — Ambulatory Visit
Admission: RE | Admit: 2018-02-27 | Discharge: 2018-02-27 | Disposition: A | Discharge status: Home / Self Care | Payer: Medicare HMO | Source: Ambulatory Visit | Attending: Oncology | Admitting: Oncology

## 2018-02-27 DIAGNOSIS — I2510 Atherosclerotic heart disease of native coronary artery without angina pectoris: Secondary | ICD-10-CM | POA: Insufficient documentation

## 2018-02-27 DIAGNOSIS — J439 Emphysema, unspecified: Secondary | ICD-10-CM | POA: Insufficient documentation

## 2018-02-27 DIAGNOSIS — R918 Other nonspecific abnormal finding of lung field: Secondary | ICD-10-CM | POA: Insufficient documentation

## 2018-02-27 DIAGNOSIS — I700 Atherosclerosis of aorta: Secondary | ICD-10-CM | POA: Insufficient documentation

## 2018-02-27 DIAGNOSIS — C3412 Malignant neoplasm of upper lobe, left bronchus or lung: Secondary | ICD-10-CM | POA: Insufficient documentation

## 2018-02-27 DIAGNOSIS — Z923 Personal history of irradiation: Secondary | ICD-10-CM | POA: Insufficient documentation

## 2018-02-27 DIAGNOSIS — C3490 Malignant neoplasm of unspecified part of unspecified bronchus or lung: Secondary | ICD-10-CM | POA: Diagnosis not present

## 2018-02-27 MED ORDER — IOPAMIDOL (ISOVUE-300) INJECTION 61%: 75.0000 mL | Freq: Once | INTRAVENOUS | Status: DC | PRN

## 2018-02-27 MED ORDER — IOPAMIDOL (ISOVUE-300) INJECTION 61%
60.0000 mL | Freq: Once | INTRAVENOUS | Status: AC | PRN
Start: 2018-02-27 — End: 2018-02-27
  Administered 2018-02-27: 60 mL via INTRAVENOUS

## 2018-03-02 NOTE — Progress Notes (Signed)
[place]  [phone] Fax:(336) [phone]  ID: Scott Gross OB: 05/23/1934  MR#: [id number]  IRJ#:[id number]  Patient Care Team: [name], MD as PCP - General (Family Medicine)  CHIEF COMPLAINT: Adenocarcinoma of upper lobe of left lung.  INTERVAL HISTORY: Patient returns to clinic today for further evaluation and discussion of his imaging results.  He is anxious, but otherwise feels well.  He has no neurologic complaints. He denies any chest pain, cough, or shortness of breath today. He has a good appetite and denies weight loss. He denies any recent fevers or illnesses. He has no nausea, vomiting, constipation, or diarrhea. He has no urinary complaints.  Patient feels at his baseline offers no specific complaints today.  REVIEW OF SYSTEMS:   Review of Systems  Constitutional: Negative.  Negative for fever, malaise/fatigue and weight loss.  Respiratory: Negative.  Negative for cough, hemoptysis and shortness of breath.   Cardiovascular: Negative.  Negative for chest pain and leg swelling.  Gastrointestinal: Negative.  Negative for abdominal pain.  Genitourinary: Negative.  Negative for dysuria.  Musculoskeletal: Negative.  Negative for joint pain.  Skin: Negative.  Negative for rash.  Neurological: Negative.  Negative for sensory change, focal weakness and weakness.  Psychiatric/Behavioral: The patient is nervous/anxious.     As per HPI. Otherwise, a complete review of systems is negative.  PAST MEDICAL HISTORY: Past Medical History:  Diagnosis Date  . Arthritis   . CAD (coronary artery disease) Nov. 12, 2012   Inferior ST elevation MI in November of 2012 with ventricular fibrillation arrest. Status post drug-eluting stent placement to the mid LAD. Most recent cardiac catheterization in June of this year showed patent stent with minimal restenosis, 75% proximal LAD and 80% distal LAD which were unchanged from before. Mild disease in the left  circumflex and moderate RCA disease. Ejection fraction was 55%.  . Cardiac arrest [facility]) Nov. 2012   x 2   . Cardiac arrest - ventricular fibrillation   . Diabetes mellitus without complication ([place])    Diet Controlled  . HOH (hard of hearing)    Left Hearing Aid  . Hyperlipidemia   . Hypertension   . Post PTCA 2013   x2  . ST elevation MI (STEMI) ([place]) 08/26/2011    PAST SURGICAL HISTORY: Past Surgical History:  Procedure Laterality Date  . CARDIAC CATHETERIZATION  2013   @ [place]; [facility] s/p stent   . CORONARY ANGIOPLASTY    . ELECTROMAGNETIC NAVIGATION BROCHOSCOPY N/A 11/27/2016   Procedure: ELECTROMAGNETIC NAVIGATION BRONCHOSCOPY;  Surgeon: [name], MD;  Location: [facility] ORS;  Service: Cardiopulmonary;  Laterality: N/A;    FAMILY HISTORY: Family History  Problem Relation Age of Onset  . Heart attack Mother   . Heart attack Father     ADVANCED DIRECTIVES (Y/N):  N  HEALTH MAINTENANCE: Social History   Tobacco Use  . Smoking status: Former Smoker    Packs/day: 1.00    Types: Cigarettes    Last attempt to quit: 08/27/2011    Years since quitting: 6.5  . Smokeless tobacco: Never Used  Substance Use Topics  . Alcohol use: No  . Drug use: No     Colonoscopy:  PAP:  Bone density:  Lipid panel:  Allergies  Allergen Reactions  . Statins Other (See Comments)    Arthralagia    Current Outpatient Medications  Medication Sig Dispense Refill  . amLODipine (NORVASC) 5 MG tablet Take 5 mg by mouth as needed. For systolic pressure greater  than 150    . aspirin 81 MG tablet Take 81 mg by mouth daily. Patient stopped on November 22, 2016 for procedure    . labetalol (NORMODYNE) 100 MG tablet     . lisinopril-hydrochlorothiazide (PRINZIDE,ZESTORETIC) 20-12.5 MG tablet      No current facility-administered medications for this visit.     OBJECTIVE: Vitals:   03/04/18 1135  BP: 111/74  Pulse: 70  Resp: 18  Temp: (!) 95.8 F (35.4 C)     Body mass index is  20.13 kg/m.    ECOG FS:0 - Asymptomatic  General: Well-developed, well-nourished, no acute distress. Eyes: Pink conjunctiva, anicteric sclera. Lungs: Clear to auscultation bilaterally. Heart: Regular rate and rhythm. No rubs, murmurs, or gallops. Abdomen: Soft, nontender, nondistended. No organomegaly noted, normoactive bowel sounds. Musculoskeletal: No edema, cyanosis, or clubbing. Neuro: Alert, answering all questions appropriately. Cranial nerves grossly intact. Skin: No rashes or petechiae noted. Psych: Normal affect.   LAB RESULTS:  Lab Results  Component Value Date   NA 132 (L) 02/10/2018   K 4.0 02/10/2018   CL 95 (L) 02/10/2018   CO2 29 02/10/2018   GLUCOSE 139 (H) 02/10/2018   BUN 9 02/10/2018   CREATININE 0.80 02/10/2018   CALCIUM 9.0 02/10/2018   PROT 7.4 11/28/2013   ALBUMIN 3.9 11/28/2013   AST 33 11/28/2013   ALT 36 11/28/2013   ALKPHOS 99 11/28/2013   BILITOT 0.6 11/28/2013   GFRNONAA >60 02/10/2018   GFRAA >60 02/10/2018    Lab Results  Component Value Date   WBC 8.7 02/10/2018   NEUTROABS 6.0 11/28/2013   HGB 15.4 02/10/2018   HCT 44.0 02/10/2018   MCV 91.7 02/10/2018   PLT 200 02/10/2018     STUDIES: Dg Chest 2 View  Result Date: 02/10/2018 CLINICAL DATA:  LEFT chest pain, hypertensive. History of LEFT lung cancer. EXAM: CHEST - 2 VIEW COMPARISON:  CT chest December 05, 2017 FINDINGS: Spiculated consolidation LEFT upper lobe with radiation seeds corresponding to known primary tumor. No pleural effusion. No pneumothorax prior cardiomediastinal silhouette is normal. Calcified aortic knob. Moderate degenerative change of the lower thoracic spine associated with levoscoliosis. Old sternal fracture. IMPRESSION: 1. Redemonstration of spiculated LEFT upper lobe consolidation with radiation seeds. 2. No acute cardiopulmonary process. Electronically Signed   By: Elon Alas M.D.   On: 02/10/2018 16:34   Ct Chest W Contrast  Result Date:  02/27/2018 CLINICAL DATA:  Lung cancer with radiation therapy. EXAM: CT CHEST WITH CONTRAST TECHNIQUE: Multidetector CT imaging of the chest was performed during intravenous contrast administration. CONTRAST:  59mL ISOVUE-300 IOPAMIDOL (ISOVUE-300) INJECTION 61% COMPARISON:  12/05/2017 FINDINGS: Cardiovascular: Normal heart size. No pericardial effusion. Aortic atherosclerosis. Calcifications within the RCA, LAD and left circumflex coronary arteries noted. Mediastinum/Nodes: Normal appearance of the thyroid gland. The trachea appears patent and is midline. Normal appearance of the esophagus. No enlarged mediastinal or hilar lymph nodes. Lungs/Pleura: No pleural effusion. Moderate changes of emphysema. Masslike architectural distortion and fibrosis within the left upper lobe is again noted. This appears stable in the interval. There is a small satellite nodule which has a part solid appearance. This measures 0.9 x 1.0 cm, image 36/3. On the previous exam this measured 0.7 x 0.7 cm. The 2 smaller adjacent nodules are more conspicuous on today's study measuring 4 mm, image 39/3. Previously 3 mm. Additional satellite nodule measures 0.7 cm on the current exam, image 45/3. Previously 0.4 cm. Upper Abdomen: No acute abnormality. Musculoskeletal: Thoracic scoliosis and degenerative disc  disease. No aggressive lytic or sclerotic bone lesions. Healed fracture deformity involving the mid body of sternum. IMPRESSION: 1. Area of treated tumor with surrounding radiation change within the left upper lobe appears similar to previous exam. However, there are multiple satellite nodules which appear progressive compared with previous exam and are suspicious for progressive tumor. 2. No evidence for thoracic adenopathy. 3. Aortic Atherosclerosis (ICD10-I70.0) and Emphysema (ICD10-J43.9). 4. RCA, LAD and left circumflex coronary artery atherosclerotic calcifications. Electronically Signed   By: Kerby Moors M.D.   On: 02/27/2018  11:47   US Renal Artery Duplex Complete  Result Date: 02/19/2018 CLINICAL DATA:  Renal artery stenosis of unknown cause EXAM: RENAL DUPLEX DOPPLER ULTRASOUND TECHNIQUE: Duplex and color Doppler ultrasound was utilized to evaluate blood flow in the renal arteries and kidneys. COMPARISON:  CTA chest 12/05/2017 and previous FINDINGS: Right Renal Artery Velocities: Origin:  147 cm/sec Mid:  143 cm/sec Hilum:  100 cm/sec Interlobar:  24 cm/sec Arcuate: 22 cm/sec Left Renal Artery Velocities: Origin:  113 cm/sec Mid:  151 cm/sec Hilum:  106 cm/sec Interlobar:  19 cm/sec Arcuate:  22 cm/sec Aortic Velocity: 70 cm/sec Right Renal-Aortic Ratios: Origin: 2.1 Mid:  2.0 Hilum: 1.4 Interlobar: 0.4 Arcuate: 0.3 Left Renal-Aortic Ratios: Origin: 1.6 Mid: 2.1 Hilum: 1.5 Interlobar: 0.3 Arcuate: 0.3 Atheromatous aorta. Patent bilateral renal veins with antegrade flow. Urinary bladder is incompletely distended, unremarkable. IMPRESSION: 1. Elevated velocities in bilateral renal arteries suggest renal artery stenosis of at least mild severity. Consider catheter angiography for further evaluation, as the lesions may be amenable to percutaneous treatment if confirmed to be significant. Electronically Signed   By: Lucrezia Europe M.D.   On: 02/19/2018 11:42    ASSESSMENT:  Adenocarcinoma of upper lobe of left lung   PLAN:   1.  Adenocarcinoma of upper lobe of left lung: Biopsy of lung nodule on November 27, 2016 revealed adenocarcinoma, but there was no invasive component on the sample. Biopsy of mediastinal lymph node had insufficient material. Patient ultimately declined any further biopsies or surgery and elected to proceed with XRT alone which he completed in April 2018.  CT scan results from Feb 19, 2018 reviewed independently and reported as above with no obvious evidence of recurrent or metastatic disease.  No intervention is needed at this time.  Return to clinic in 6 months with repeat imaging and further evaluation. 2.  Hypertension: Patient blood pressure is within normal limits today.  Continue monitoring and treatment per primary care.  Approximately 20 minutes was spent in discussion of which greater than 50% was consultation.  Patient expressed understanding and was in agreement with this plan. He also understands that He can call clinic at any time with any questions, concerns, or complaints.   Cancer Staging No matching staging information was found for the patient.  [name], MD   03/07/2018 11:27 AM

## 2018-03-03 DIAGNOSIS — I739 Peripheral vascular disease, unspecified: Secondary | ICD-10-CM | POA: Diagnosis not present

## 2018-03-03 DIAGNOSIS — I2510 Atherosclerotic heart disease of native coronary artery without angina pectoris: Secondary | ICD-10-CM | POA: Diagnosis not present

## 2018-03-03 DIAGNOSIS — I252 Old myocardial infarction: Secondary | ICD-10-CM | POA: Diagnosis not present

## 2018-03-03 DIAGNOSIS — E7849 Other hyperlipidemia: Secondary | ICD-10-CM | POA: Diagnosis not present

## 2018-03-03 DIAGNOSIS — I10 Essential (primary) hypertension: Secondary | ICD-10-CM | POA: Diagnosis not present

## 2018-03-03 DIAGNOSIS — R9431 Abnormal electrocardiogram [ECG] [EKG]: Secondary | ICD-10-CM | POA: Diagnosis not present

## 2018-03-03 DIAGNOSIS — I208 Other forms of angina pectoris: Secondary | ICD-10-CM | POA: Diagnosis not present

## 2018-03-03 DIAGNOSIS — I701 Atherosclerosis of renal artery: Secondary | ICD-10-CM | POA: Diagnosis not present

## 2018-03-03 DIAGNOSIS — E119 Type 2 diabetes mellitus without complications: Secondary | ICD-10-CM | POA: Diagnosis not present

## 2018-03-04 ENCOUNTER — Inpatient Hospital Stay: Payer: Medicare HMO | Attending: Oncology | Admitting: Oncology

## 2018-03-04 ENCOUNTER — Encounter: Payer: Self-pay | Admitting: Radiation Oncology

## 2018-03-04 ENCOUNTER — Ambulatory Visit
Admission: RE | Admit: 2018-03-04 | Discharge: 2018-03-04 | Disposition: A | Discharge status: Home / Self Care | Payer: Medicare HMO | Source: Ambulatory Visit | Attending: Radiation Oncology | Admitting: Radiation Oncology

## 2018-03-04 ENCOUNTER — Other Ambulatory Visit: Payer: Self-pay

## 2018-03-04 ENCOUNTER — Other Ambulatory Visit: Payer: Self-pay | Admitting: *Deleted

## 2018-03-04 VITALS — BP 111/74 | HR 70 | Temp 95.8°F | Resp 18 | Wt 144.3 lb

## 2018-03-04 VITALS — BP 120/77 | HR 72 | Resp 20 | Wt 144.3 lb

## 2018-03-04 DIAGNOSIS — I10 Essential (primary) hypertension: Secondary | ICD-10-CM

## 2018-03-04 DIAGNOSIS — C3412 Malignant neoplasm of upper lobe, left bronchus or lung: Secondary | ICD-10-CM

## 2018-03-04 DIAGNOSIS — E119 Type 2 diabetes mellitus without complications: Secondary | ICD-10-CM | POA: Insufficient documentation

## 2018-03-04 DIAGNOSIS — Z85118 Personal history of other malignant neoplasm of bronchus and lung: Secondary | ICD-10-CM | POA: Diagnosis not present

## 2018-03-04 DIAGNOSIS — Z87891 Personal history of nicotine dependence: Secondary | ICD-10-CM | POA: Diagnosis not present

## 2018-03-04 DIAGNOSIS — Z923 Personal history of irradiation: Secondary | ICD-10-CM | POA: Diagnosis not present

## 2018-03-04 NOTE — Progress Notes (Signed)
Here for follow up. S/b Dr Donella Stade this am  Per pt " feeling fine "

## 2018-03-04 NOTE — Progress Notes (Signed)
Radiation Oncology Follow up Note  Name: Scott Gross   Date:   03/04/2018 MRN:  [id number] DOB: 09-07-1934    This 84 y.o. male presents to the clinic today for 13 month follow-up status post SB RTto his left upper lobe for stage I adenocarcinoma.  REFERRING PROVIDER: [name], MD  HPI: patient is a 84 year old male.now out 13 months having completed SB RT to his left upper lobe for stage I adenocarcinoma. Seen today in routine follow-up he is doing well. He had a repeat CT scanrecently showing radiation change within the left upper lobe similar to previous exam. There are multiple satellite nodules very low density which radiology is concerned about.  COMPLICATIONS OF TREATMENT: none  FOLLOW UP COMPLIANCE: keeps appointments   PHYSICAL EXAM:  BP 120/77   Pulse 72   Resp 20   Wt 144 lb 4.7 oz (65.5 kg)   BMI 20.12 kg/m  Well-developed well-nourished patient in NAD. HEENT reveals PERLA, EOMI, discs not visualized.  Oral cavity is clear. No oral mucosal lesions are identified. Neck is clear without evidence of cervical or supraclavicular adenopathy. Lungs are clear to A&P. Cardiac examination is essentially unremarkable with regular rate and rhythm without murmur rub or thrill. Abdomen is benign with no organomegaly or masses noted. Motor sensory and DTR levels are equal and symmetric in the upper and lower extremities. Cranial nerves II through XII are grossly intact. Proprioception is intact. No peripheral adenopathy or edema is identified. No motor or sensory levels are noted. Crude visual fields are within normal range.  RADIOLOGY RESULTS: CT scans reviewed. These areas of satellite nodules are extremely low density and I'm fine with continue to observe  PLAN: present time patient is doing well asymptomatic I'll repeat a CT scan in 6 months and see him back shortly thereafter. I'm not convinced these settle nodules are significant at this time. Patient understands my  recommendations. I've also discussed with medical oncology who was continue to follow closely. Patient is to call sooner with any concerns.  I would like to take this opportunity to thank you for allowing me to participate in the care of your patient.Noreene Filbert, MD

## 2018-04-08 ENCOUNTER — Encounter: Payer: Self-pay | Admitting: Radiation Oncology

## 2018-07-08 DIAGNOSIS — Z23 Encounter for immunization: Secondary | ICD-10-CM | POA: Diagnosis not present

## 2018-07-08 DIAGNOSIS — I10 Essential (primary) hypertension: Secondary | ICD-10-CM | POA: Diagnosis not present

## 2018-07-08 DIAGNOSIS — Z0000 Encounter for general adult medical examination without abnormal findings: Secondary | ICD-10-CM | POA: Diagnosis not present

## 2018-07-08 DIAGNOSIS — R739 Hyperglycemia, unspecified: Secondary | ICD-10-CM | POA: Diagnosis not present

## 2018-07-10 DIAGNOSIS — Z125 Encounter for screening for malignant neoplasm of prostate: Secondary | ICD-10-CM | POA: Diagnosis not present

## 2018-07-10 DIAGNOSIS — I10 Essential (primary) hypertension: Secondary | ICD-10-CM | POA: Diagnosis not present

## 2018-07-10 DIAGNOSIS — R739 Hyperglycemia, unspecified: Secondary | ICD-10-CM | POA: Diagnosis not present

## 2018-08-28 DIAGNOSIS — E7849 Other hyperlipidemia: Secondary | ICD-10-CM | POA: Diagnosis not present

## 2018-08-28 DIAGNOSIS — I739 Peripheral vascular disease, unspecified: Secondary | ICD-10-CM | POA: Diagnosis not present

## 2018-08-28 DIAGNOSIS — I208 Other forms of angina pectoris: Secondary | ICD-10-CM | POA: Diagnosis not present

## 2018-08-28 DIAGNOSIS — R9431 Abnormal electrocardiogram [ECG] [EKG]: Secondary | ICD-10-CM | POA: Diagnosis not present

## 2018-08-28 DIAGNOSIS — I252 Old myocardial infarction: Secondary | ICD-10-CM | POA: Diagnosis not present

## 2018-08-28 DIAGNOSIS — I2510 Atherosclerotic heart disease of native coronary artery without angina pectoris: Secondary | ICD-10-CM | POA: Diagnosis not present

## 2018-08-28 DIAGNOSIS — I10 Essential (primary) hypertension: Secondary | ICD-10-CM | POA: Diagnosis not present

## 2018-08-28 DIAGNOSIS — E119 Type 2 diabetes mellitus without complications: Secondary | ICD-10-CM | POA: Diagnosis not present

## 2018-08-28 DIAGNOSIS — I701 Atherosclerosis of renal artery: Secondary | ICD-10-CM | POA: Diagnosis not present

## 2018-09-10 ENCOUNTER — Ambulatory Visit
Admission: RE | Admit: 2018-09-10 | Discharge: 2018-09-10 | Disposition: A | Discharge status: Home / Self Care | Payer: Medicare HMO | Source: Ambulatory Visit | Attending: Radiation Oncology | Admitting: Radiation Oncology

## 2018-09-10 DIAGNOSIS — J432 Centrilobular emphysema: Secondary | ICD-10-CM | POA: Diagnosis not present

## 2018-09-10 DIAGNOSIS — C3412 Malignant neoplasm of upper lobe, left bronchus or lung: Secondary | ICD-10-CM | POA: Diagnosis not present

## 2018-09-10 LAB — POCT I-STAT CREATININE: Creatinine, Ser: 0.9 mg/dL (ref 0.61–1.24)

## 2018-09-10 MED ORDER — IOHEXOL 300 MG/ML  SOLN
75.0000 mL | Freq: Once | INTRAMUSCULAR | Status: AC | PRN
  Administered 2018-09-10: 75 mL via INTRAVENOUS

## 2018-09-14 NOTE — Progress Notes (Signed)
[place]  Telephone:(336) [phone] Fax:(336) [phone]  ID: Scott Gross OB: [birth year]  MR#: [id number]  KGM#:[id number]  Patient Care Team: [name], MD as PCP - General (Family Medicine)  CHIEF COMPLAINT: Adenocarcinoma of upper lobe of left lung.  INTERVAL HISTORY: Patient returns to clinic today for further evaluation and discussion of his imaging results.  He continues to be anxious, but otherwise feels well.  He has no neurologic complaints. He denies any chest pain, cough, hemoptysis, or shortness of breath today. He has a good appetite and denies weight loss. He denies any recent fevers or illnesses. He has no nausea, vomiting, constipation, or diarrhea. He has no urinary complaints.  Patient offers no specific complaints today.  REVIEW OF SYSTEMS:   Review of Systems  Constitutional: Negative.  Negative for fever, malaise/fatigue and weight loss.  Respiratory: Negative.  Negative for cough, hemoptysis and shortness of breath.   Cardiovascular: Negative.  Negative for chest pain and leg swelling.  Gastrointestinal: Negative.  Negative for abdominal pain.  Genitourinary: Negative.  Negative for dysuria.  Musculoskeletal: Negative.  Negative for joint pain.  Skin: Negative.  Negative for rash.  Neurological: Negative.  Negative for sensory change, focal weakness and weakness.  Psychiatric/Behavioral: The patient is nervous/anxious.     As per HPI. Otherwise, a complete review of systems is negative.  PAST MEDICAL HISTORY: Past Medical History:  Diagnosis Date  . Arthritis   . CAD (coronary artery disease) Nov. 12, 2012   Inferior ST elevation MI in November of 2012 with ventricular fibrillation arrest. Status post drug-eluting stent placement to the mid LAD. Most recent cardiac catheterization in June of this year showed patent stent with minimal restenosis, 75% proximal LAD and 80% distal LAD which were unchanged from before. Mild disease in the  left circumflex and moderate RCA disease. Ejection fraction was 55%.  . Cardiac arrest [facility]) Nov. 2012   x 2   . Cardiac arrest - ventricular fibrillation   . Diabetes mellitus without complication ([place])    Diet Controlled  . HOH (hard of hearing)    Left Hearing Aid  . Hyperlipidemia   . Hypertension   . Post PTCA 2013   x2  . ST elevation MI (STEMI) ([place]) 08/26/2011    PAST SURGICAL HISTORY: Past Surgical History:  Procedure Laterality Date  . CARDIAC CATHETERIZATION  2013   @ [place]; [facility] s/p stent   . CORONARY ANGIOPLASTY    . ELECTROMAGNETIC NAVIGATION BROCHOSCOPY N/A 11/27/2016   Procedure: ELECTROMAGNETIC NAVIGATION BRONCHOSCOPY;  Surgeon: [name], MD;  Location: [facility] ORS;  Service: Cardiopulmonary;  Laterality: N/A;    FAMILY HISTORY: Family History  Problem Relation Age of Onset  . Heart attack Mother   . Heart attack Father     ADVANCED DIRECTIVES (Y/N):  N  HEALTH MAINTENANCE: Social History   Tobacco Use  . Smoking status: Former Smoker    Packs/day: 1.00    Types: Cigarettes    Last attempt to quit: 08/27/2011    Years since quitting: 7.0  . Smokeless tobacco: Never Used  Substance Use Topics  . Alcohol use: No  . Drug use: No     Colonoscopy:  PAP:  Bone density:  Lipid panel:  Allergies  Allergen Reactions  . Statins Other (See Comments)    Arthralagia    Current Outpatient Medications  Medication Sig Dispense Refill  . amLODipine (NORVASC) 5 MG tablet Take 5 mg by mouth as needed. For systolic pressure greater than  150    . aspirin 81 MG tablet Take 81 mg by mouth daily. Patient stopped on November 22, 2016 for procedure    . furosemide (LASIX) 20 MG tablet   3  . labetalol (NORMODYNE) 100 MG tablet     . lisinopril-hydrochlorothiazide (PRINZIDE,ZESTORETIC) 20-12.5 MG tablet     . tiZANidine (ZANAFLEX) 4 MG tablet   2   No current facility-administered medications for this visit.     OBJECTIVE: There were no vitals filed for  this visit.   There is no height or weight on file to calculate BMI.    ECOG FS:0 - Asymptomatic  General: Well-developed, well-nourished, no acute distress. Eyes: Pink conjunctiva, anicteric sclera. HEENT: Normocephalic, moist mucous membranes. Lungs: Clear to auscultation bilaterally. Heart: Regular rate and rhythm. No rubs, murmurs, or gallops. Abdomen: Soft, nontender, nondistended. No organomegaly noted, normoactive bowel sounds. Musculoskeletal: No edema, cyanosis, or clubbing. Neuro: Alert, answering all questions appropriately. Cranial nerves grossly intact. Skin: No rashes or petechiae noted. Psych: Normal affect.  LAB RESULTS:  Lab Results  Component Value Date   NA 132 (L) 02/10/2018   K 4.0 02/10/2018   CL 95 (L) 02/10/2018   CO2 29 02/10/2018   GLUCOSE 139 (H) 02/10/2018   BUN 9 02/10/2018   CREATININE 0.90 09/10/2018   CALCIUM 9.0 02/10/2018   PROT 7.4 11/28/2013   ALBUMIN 3.9 11/28/2013   AST 33 11/28/2013   ALT 36 11/28/2013   ALKPHOS 99 11/28/2013   BILITOT 0.6 11/28/2013   GFRNONAA >60 02/10/2018   GFRAA >60 02/10/2018    Lab Results  Component Value Date   WBC 8.7 02/10/2018   NEUTROABS 6.0 11/28/2013   HGB 15.4 02/10/2018   HCT 44.0 02/10/2018   MCV 91.7 02/10/2018   PLT 200 02/10/2018     STUDIES: Ct Chest W Contrast  Result Date: 09/10/2018 CLINICAL DATA:  84 year old male with history of lung cancer status post radiation therapy. Follow-up study. EXAM: CT CHEST WITH CONTRAST TECHNIQUE: Multidetector CT imaging of the chest was performed during intravenous contrast administration. CONTRAST:  44mL OMNIPAQUE IOHEXOL 300 MG/ML  SOLN COMPARISON:  Chest CT 02/27/2018. FINDINGS: Cardiovascular: Heart size is normal. There is no significant pericardial fluid, thickening or pericardial calcification. There is aortic atherosclerosis, as well as atherosclerosis of the great vessels of the mediastinum and the coronary arteries, including calcified  atherosclerotic plaque in the left anterior descending, left circumflex and right coronary arteries. Mediastinum/Nodes: No pathologically enlarged mediastinal or hilar lymph nodes. Esophagus is unremarkable in appearance. No axillary lymphadenopathy. Lungs/Pleura: There continues to be a mass-like area of architectural distortion in the left upper lobe at the site of treated neoplasm, much of which is simply compatible with evolving postradiation mass-like fibrosis. However, there are several satellite nodular areas around this which are concerning for locally recurrent disease. The largest of these is located posteriorly in the left upper lobe in contact with the left major fissure (axial image 37 of series 3) measuring 2.3 x 1.3 cm (previously 9 x 10 mm), with some retraction of the adjacent fissure. The other notable lesion is on axial image 47 of series 3 measuring 9 x 13 mm (previously only 7 mm). Several smaller areas of nodularity are also noted. No acute consolidative airspace disease. No pleural effusions. Mild diffuse bronchial wall thickening with mild centrilobular and paraseptal emphysema. Upper Abdomen: Aortic atherosclerosis. Musculoskeletal: Old healed sternal fracture incidentally noted. There are no aggressive appearing lytic or blastic lesions noted in the visualized portions  of the skeleton. IMPRESSION: 1. Evolving postradiation changes in the left upper lobe with several enlarging nodular areas in this region concerning for locally recurrent disease. 2. Aortic atherosclerosis, in addition to three vessel coronary artery disease. Assessment for potential risk factor modification, dietary therapy or pharmacologic therapy may be warranted, if clinically indicated. 3. Mild diffuse bronchial wall thickening with mild centrilobular and paraseptal emphysema; imaging findings suggestive of underlying COPD. Aortic Atherosclerosis (ICD10-I70.0) and Emphysema (ICD10-J43.9). Electronically Signed   By:  Vinnie Langton M.D.   On: 09/10/2018 11:55    ASSESSMENT:  Adenocarcinoma of upper lobe of left lung   PLAN:   1.  Adenocarcinoma of upper lobe of left lung: Biopsy of lung nodule on November 27, 2016 revealed adenocarcinoma, but there was no invasive component on the sample. Biopsy of mediastinal lymph node had insufficient material. Patient ultimately declined any further biopsies or surgery and elected to proceed with XRT alone which he completed in April 2018.  CT scan results from September 10, 2018 reviewed independently and report as above with several enlarging nodular areas concerning for locally recurrent disease.  Will get a PET scan in the next 1 to 2 weeks to further evaluate.  Patient will then follow-up after his imaging to discuss the results and any additional treatment planning necessary.   2. Hypertension: Patient blood pressure is within normal limits today.  Continue monitoring and treatment per primary care.  I spent a total of 20 minutes face-to-face with the patient of which greater than 50% of the visit was spent in counseling and coordination of care as detailed above.   Patient expressed understanding and was in agreement with this plan. He also understands that He can call clinic at any time with any questions, concerns, or complaints.   Cancer Staging No matching staging information was found for the patient.  [name], MD   09/20/2018 8:47 AM

## 2018-09-18 ENCOUNTER — Other Ambulatory Visit: Payer: Self-pay

## 2018-09-18 ENCOUNTER — Other Ambulatory Visit: Payer: Self-pay | Admitting: *Deleted

## 2018-09-18 ENCOUNTER — Encounter: Payer: Self-pay | Admitting: Radiation Oncology

## 2018-09-18 ENCOUNTER — Inpatient Hospital Stay: Payer: Medicare HMO | Attending: Oncology | Admitting: Oncology

## 2018-09-18 ENCOUNTER — Ambulatory Visit
Admission: RE | Admit: 2018-09-18 | Discharge: 2018-09-18 | Disposition: A | Discharge status: Home / Self Care | Payer: Medicare HMO | Source: Ambulatory Visit | Attending: Radiation Oncology | Admitting: Radiation Oncology

## 2018-09-18 VITALS — BP 138/80 | HR 71 | Temp 95.7°F | Resp 18 | Wt 147.7 lb

## 2018-09-18 DIAGNOSIS — Z87891 Personal history of nicotine dependence: Secondary | ICD-10-CM | POA: Insufficient documentation

## 2018-09-18 DIAGNOSIS — I10 Essential (primary) hypertension: Secondary | ICD-10-CM | POA: Insufficient documentation

## 2018-09-18 DIAGNOSIS — E119 Type 2 diabetes mellitus without complications: Secondary | ICD-10-CM

## 2018-09-18 DIAGNOSIS — Z923 Personal history of irradiation: Secondary | ICD-10-CM | POA: Diagnosis not present

## 2018-09-18 DIAGNOSIS — C3412 Malignant neoplasm of upper lobe, left bronchus or lung: Secondary | ICD-10-CM

## 2018-09-18 DIAGNOSIS — Z79899 Other long term (current) drug therapy: Secondary | ICD-10-CM

## 2018-09-18 DIAGNOSIS — Z7982 Long term (current) use of aspirin: Secondary | ICD-10-CM | POA: Diagnosis not present

## 2018-09-18 NOTE — Progress Notes (Signed)
Radiation Oncology Follow up Note  Name: Scott Gross   Date:   09/18/2018 MRN:  [id number] DOB: 08-23-1934    This 84 y.o. male presents to the clinic today for 19 month follow-up status post SB RT to his left upper lobe for stage I adenocarcinoma.  REFERRING PROVIDER: [name], MD  HPI: patient is an 84 year old male now out 19 months having completed SB RT to his left upper lobe for stage I adenocarcinoma we continued to follow radiographic changes in this region.most recent CT scan performed last week showed evolving postradiation changes in left upper lobe although there were several enlarging nodular areas concerning for local recurrent disease. He is asymptomatic specifically denies cough hemoptysis or chest tightness.  COMPLICATIONS OF TREATMENT: none  FOLLOW UP COMPLIANCE: keeps appointments   PHYSICAL EXAM:  BP 138/80 (BP Location: Left Arm, Patient Position: Sitting)   Pulse 71   Temp (!) 95.7 F (35.4 C) (Tympanic)   Resp 18   Wt 147 lb 11.3 oz (67 kg)   BMI 20.60 kg/m  Well-developed well-nourished patient in NAD. HEENT reveals PERLA, EOMI, discs not visualized.  Oral cavity is clear. No oral mucosal lesions are identified. Neck is clear without evidence of cervical or supraclavicular adenopathy. Lungs are clear to A&P. Cardiac examination is essentially unremarkable with regular rate and rhythm without murmur rub or thrill. Abdomen is benign with no organomegaly or masses noted. Motor sensory and DTR levels are equal and symmetric in the upper and lower extremities. Cranial nerves II through XII are grossly intact. Proprioception is intact. No peripheral adenopathy or edema is identified. No motor or sensory levels are noted. Crude visual fields are within normal range.  RADIOLOGY RESULTS: CT scans reviewed compatible above-stated findings  PLAN: I have ordered a PET CT scan for clarification of theseevolving changes in his left upper lobe. He is also seeing Dr.  Grayland Ormond who will order a CT-guided biopsy should there be rationale based on the repeat PET scan. I will see the patient back after his PET scan will make further recommendations at the time of review of his PET/CT scan. Patient Plans her treatment plan well.  I would like to take this opportunity to thank you for allowing me to participate in the care of your patient.Noreene Filbert, MD

## 2018-09-18 NOTE — Progress Notes (Signed)
Patient is here to follow up of his lung cancer. Patient saw Dr. Baruch Gouty this morning. Patient stated that he had been doing well. Patient denied fever, chills, SOB, nausea, vomiting, constipation and diarrhea.

## 2018-09-19 ENCOUNTER — Ambulatory Visit: Payer: Medicare HMO | Admitting: Radiation Oncology

## 2018-09-25 ENCOUNTER — Ambulatory Visit
Admission: RE | Admit: 2018-09-25 | Discharge: 2018-09-25 | Disposition: A | Discharge status: Home / Self Care | Payer: Medicare HMO | Source: Ambulatory Visit | Attending: Radiation Oncology | Admitting: Radiation Oncology

## 2018-09-25 DIAGNOSIS — C3412 Malignant neoplasm of upper lobe, left bronchus or lung: Secondary | ICD-10-CM | POA: Diagnosis not present

## 2018-09-25 DIAGNOSIS — J8410 Pulmonary fibrosis, unspecified: Secondary | ICD-10-CM | POA: Diagnosis not present

## 2018-09-25 DIAGNOSIS — J701 Chronic and other pulmonary manifestations due to radiation: Secondary | ICD-10-CM | POA: Diagnosis not present

## 2018-09-25 DIAGNOSIS — I700 Atherosclerosis of aorta: Secondary | ICD-10-CM | POA: Diagnosis not present

## 2018-09-25 DIAGNOSIS — J432 Centrilobular emphysema: Secondary | ICD-10-CM | POA: Insufficient documentation

## 2018-09-25 DIAGNOSIS — R918 Other nonspecific abnormal finding of lung field: Secondary | ICD-10-CM | POA: Diagnosis not present

## 2018-09-25 DIAGNOSIS — I2510 Atherosclerotic heart disease of native coronary artery without angina pectoris: Secondary | ICD-10-CM | POA: Insufficient documentation

## 2018-09-25 LAB — GLUCOSE, CAPILLARY: Glucose-Capillary: 108 mg/dL — ABNORMAL HIGH (ref 70–99)

## 2018-09-25 MED ORDER — FLUDEOXYGLUCOSE F - 18 (FDG) INJECTION
7.6000 | Freq: Once | INTRAVENOUS | Status: AC | PRN
  Administered 2018-09-25: 8.21 via INTRAVENOUS

## 2018-09-28 NOTE — Progress Notes (Signed)
[place]  [phone] [phone]) [phone]  ID: Scott Gross OB: Mar 31, 1934  MR#: [id number]  BPZ#:[id number]  Patient Care Team: [name], MD as PCP - General (Family Medicine)  CHIEF COMPLAINT: Adenocarcinoma of upper lobe of left lung.  INTERVAL HISTORY: Patient returns to clinic today for further evaluation and discussion of his imaging results.  He continues to be anxious, but otherwise feels well. He has no neurologic complaints. He denies any chest pain, cough, hemoptysis, or shortness of breath today. He has a good appetite and denies weight loss. He denies any recent fevers or illnesses. He has no nausea, vomiting, constipation, or diarrhea. He has no urinary complaints.  Patient feels at his baseline offers no specific complaints today.  REVIEW OF SYSTEMS:   Review of Systems  Constitutional: Negative.  Negative for fever, malaise/fatigue and weight loss.  Respiratory: Negative.  Negative for cough, hemoptysis and shortness of breath.   Cardiovascular: Negative.  Negative for chest pain and leg swelling.  Gastrointestinal: Negative.  Negative for abdominal pain.  Genitourinary: Negative.  Negative for dysuria.  Musculoskeletal: Negative.  Negative for joint pain.  Skin: Negative.  Negative for rash.  Neurological: Negative.  Negative for sensory change, focal weakness, weakness and headaches.  Psychiatric/Behavioral: The patient is nervous/anxious.     As per HPI. Otherwise, a complete review of systems is negative.  PAST MEDICAL HISTORY: Past Medical History:  Diagnosis Date  . Arthritis   . CAD (coronary artery disease) Nov. 12, 2012   Inferior ST elevation MI in November of 2012 with ventricular fibrillation arrest. Status post drug-eluting stent placement to the mid LAD. Most recent cardiac catheterization in June of this year showed patent stent with minimal restenosis, 75% proximal LAD and 80% distal LAD which were unchanged  from before. Mild disease in the left circumflex and moderate RCA disease. Ejection fraction was 55%.  . Cardiac arrest [facility]) Nov. 2012   x 2   . Cardiac arrest - ventricular fibrillation   . Diabetes mellitus without complication ([place])    Diet Controlled  . HOH (hard of hearing)    Left Hearing Aid  . Hyperlipidemia   . Hypertension   . Post PTCA 2013   x2  . ST elevation MI (STEMI) ([place]) 08/26/2011    PAST SURGICAL HISTORY: Past Surgical History:  Procedure Laterality Date  . CARDIAC CATHETERIZATION  2013   @ [place]; [facility] s/p stent   . CORONARY ANGIOPLASTY    . ELECTROMAGNETIC NAVIGATION BROCHOSCOPY N/A 11/27/2016   Procedure: ELECTROMAGNETIC NAVIGATION BRONCHOSCOPY;  Surgeon: [name], MD;  Location: [facility] ORS;  Service: Cardiopulmonary;  Laterality: N/A;    FAMILY HISTORY: Family History  Problem Relation Age of Onset  . Heart attack Mother   . Heart attack Father     ADVANCED DIRECTIVES (Y/N):  N  HEALTH MAINTENANCE: Social History   Tobacco Use  . Smoking status: Former Smoker    Packs/day: 1.00    Types: Cigarettes    Last attempt to quit: 08/27/2011    Years since quitting: 7.1  . Smokeless tobacco: Never Used  Substance Use Topics  . Alcohol use: No  . Drug use: No     Colonoscopy:  PAP:  Bone density:  Lipid panel:  Allergies  Allergen Reactions  . Statins Other (See Comments)    Arthralagia    Current Outpatient Medications  Medication Sig Dispense Refill  . amLODipine (NORVASC) 5 MG tablet Take 5 mg by mouth as needed. For  systolic pressure greater than 150    . aspirin 81 MG tablet Take 81 mg by mouth daily. Patient stopped on November 22, 2016 for procedure    . furosemide (LASIX) 20 MG tablet   3  . labetalol (NORMODYNE) 100 MG tablet     . lisinopril-hydrochlorothiazide (PRINZIDE,ZESTORETIC) 20-12.5 MG tablet     . tiZANidine (ZANAFLEX) 4 MG tablet   2   No current facility-administered medications for this visit.      OBJECTIVE: There were no vitals filed for this visit.   There is no height or weight on file to calculate BMI.    ECOG FS:0 - Asymptomatic  General: Thin, no acute distress. Eyes: Pink conjunctiva, anicteric sclera. HEENT: Normocephalic, moist mucous membranes, clear oropharnyx. Lungs: Clear to auscultation bilaterally. Heart: Regular rate and rhythm. No rubs, murmurs, or gallops. Abdomen: Soft, nontender, nondistended. No organomegaly noted, normoactive bowel sounds. Musculoskeletal: No edema, cyanosis, or clubbing. Neuro: Alert, answering all questions appropriately. Cranial nerves grossly intact. Skin: No rashes or petechiae noted. Psych: Normal affect.  LAB RESULTS:  Lab Results  Component Value Date   NA 132 (L) 02/10/2018   K 4.0 02/10/2018   CL 95 (L) 02/10/2018   CO2 29 02/10/2018   GLUCOSE 139 (H) 02/10/2018   BUN 9 02/10/2018   CREATININE 0.90 09/10/2018   CALCIUM 9.0 02/10/2018   PROT 7.4 11/28/2013   ALBUMIN 3.9 11/28/2013   AST 33 11/28/2013   ALT 36 11/28/2013   ALKPHOS 99 11/28/2013   BILITOT 0.6 11/28/2013   GFRNONAA >60 02/10/2018   GFRAA >60 02/10/2018    Lab Results  Component Value Date   WBC 8.7 02/10/2018   NEUTROABS 6.0 11/28/2013   HGB 15.4 02/10/2018   HCT 44.0 02/10/2018   MCV 91.7 02/10/2018   PLT 200 02/10/2018     STUDIES: Ct Chest W Contrast  Result Date: 09/10/2018 CLINICAL DATA:  84 year old male with history of lung cancer status post radiation therapy. Follow-up study. EXAM: CT CHEST WITH CONTRAST TECHNIQUE: Multidetector CT imaging of the chest was performed during intravenous contrast administration. CONTRAST:  48mL OMNIPAQUE IOHEXOL 300 MG/ML  SOLN COMPARISON:  Chest CT 02/27/2018. FINDINGS: Cardiovascular: Heart size is normal. There is no significant pericardial fluid, thickening or pericardial calcification. There is aortic atherosclerosis, as well as atherosclerosis of the great vessels of the mediastinum and the  coronary arteries, including calcified atherosclerotic plaque in the left anterior descending, left circumflex and right coronary arteries. Mediastinum/Nodes: No pathologically enlarged mediastinal or hilar lymph nodes. Esophagus is unremarkable in appearance. No axillary lymphadenopathy. Lungs/Pleura: There continues to be a mass-like area of architectural distortion in the left upper lobe at the site of treated neoplasm, much of which is simply compatible with evolving postradiation mass-like fibrosis. However, there are several satellite nodular areas around this which are concerning for locally recurrent disease. The largest of these is located posteriorly in the left upper lobe in contact with the left major fissure (axial image 37 of series 3) measuring 2.3 x 1.3 cm (previously 9 x 10 mm), with some retraction of the adjacent fissure. The other notable lesion is on axial image 47 of series 3 measuring 9 x 13 mm (previously only 7 mm). Several smaller areas of nodularity are also noted. No acute consolidative airspace disease. No pleural effusions. Mild diffuse bronchial wall thickening with mild centrilobular and paraseptal emphysema. Upper Abdomen: Aortic atherosclerosis. Musculoskeletal: Old healed sternal fracture incidentally noted. There are no aggressive appearing lytic or blastic lesions  noted in the visualized portions of the skeleton. IMPRESSION: 1. Evolving postradiation changes in the left upper lobe with several enlarging nodular areas in this region concerning for locally recurrent disease. 2. Aortic atherosclerosis, in addition to three vessel coronary artery disease. Assessment for potential risk factor modification, dietary therapy or pharmacologic therapy may be warranted, if clinically indicated. 3. Mild diffuse bronchial wall thickening with mild centrilobular and paraseptal emphysema; imaging findings suggestive of underlying COPD. Aortic Atherosclerosis (ICD10-I70.0) and Emphysema  (ICD10-J43.9). Electronically Signed   By: Vinnie Langton M.D.   On: 09/10/2018 11:55   Nm Pet Image Restag (ps) Skull Base To Thigh  Result Date: 09/25/2018 CLINICAL DATA:  Subsequent treatment strategy for history of left upper lobe lung cancer. Status post radiation therapy 1 year ago. Developing nodularity. EXAM: NUCLEAR MEDICINE PET SKULL BASE TO THIGH TECHNIQUE: 8.2 mCi F-18 FDG was injected intravenously. Full-ring PET imaging was performed from the skull base to thigh after the radiotracer. CT data was obtained and used for attenuation correction and anatomic localization. Fasting blood glucose: 108 mg/dl COMPARISON:  Prior chest CTs, including 09/10/2018. Most recent PET of 09/24/2016. FINDINGS: Mediastinal blood pool activity: SUV max 2.5 NECK: No areas of abnormal hypermetabolism. Incidental CT findings: Cerebral and cerebellar atrophy. No cervical adenopathy. CHEST: consolidation within the anterior left upper lobe and left apex is a typical of radiation fibrosis and demonstrates low-level hypermetabolism, including at a S.U.V. max of 2.6 on image 70/3. However, as on CT, there are more nodular components which are suspicious for recurrent/metastatic disease. Example within the posterior left upper lobe at 1.5 cm and a S.U.V. max of 6.5 on image 75/3. Within the central left upper lobe at 3.1 cm and a S.U.V. max of 5.5 on image 81/3. Right paratracheal node measures 9 mm and a S.U.V. max of 3.2 on image 81/3. Compare similar in size and a S.U.V. max of 2.9 on the prior PET (when remeasured). Right hilar hypermetabolism measures a S.U.V. max of 3.5 today versus a S.U.V. max of 3.3 on the prior. Small nodes within the AP window with hypermetabolism at up to a S.U.V. max of 3.3 today versus a S.U.V. max of 2.8 on the prior. Incidental CT findings: Aortic and coronary artery calcification. Centrilobular emphysema. ABDOMEN/PELVIS: A focus of hypermetabolism within the distal sigmoid is without CT  correlate and measures a S.U.V. max of 4.6, including on approximately image 226/3. Otherwise, no abdominopelvic parenchymal or nodal hypermetabolism. Incidental CT findings: Abdominal aortic atherosclerosis. Normal adrenal glands. Mild prostatomegaly. SKELETON: No abnormal marrow activity. Incidental CT findings: none IMPRESSION: 1. Left upper lobe radiation fibrosis. Areas of more focal nodular hypermetabolism, suspicious for recurrent/metastatic disease. 2. Hypermetabolism within mediastinal and right hilar nodes. Given relative stability to 09/24/2016, favored to be reactive. 3. Sigmoid hypermetabolism is favored to be physiologic. Correlation with the patient's colon cancer screening history is recommended. If screening is not up-to-date, appropriate screening should be considered. 4. Aortic atherosclerosis (ICD10-I70.0), coronary artery atherosclerosis and emphysema (ICD10-J43.9). Electronically Signed   By: Abigail Miyamoto M.D.   On: 09/25/2018 13:14    ASSESSMENT:  Adenocarcinoma of upper lobe of left lung   PLAN:   1.  Adenocarcinoma of upper lobe of left lung: Biopsy of lung nodule on November 27, 2016 revealed adenocarcinoma, but there was no invasive component on the sample. Biopsy of mediastinal lymph node had insufficient material. Patient ultimately declined any further biopsies or surgery and elected to proceed with XRT alone which he completed in April  2018.  PET scan results from September 25, 2018 reviewed independently and report as above consistent with radiation fibrosis.  Hypermetabolism in mediastinal and hilar nodes are stable for several years and thought to be reactive.  We will continue to monitor closely.  Return to clinic in 3 months with repeat imaging and further evaluation.   2. Hypertension: Patient blood pressure is within normal limits today.  Continue monitoring and treatment per primary care.  I spent a total of 30 minutes face-to-face with the patient of which greater  than 50% of the visit was spent in counseling and coordination of care as detailed above.   Patient expressed understanding and was in agreement with this plan. He also understands that He can call clinic at any time with any questions, concerns, or complaints.   Cancer Staging No matching staging information was found for the patient.  [name], MD   10/04/2018 7:33 AM

## 2018-10-02 ENCOUNTER — Other Ambulatory Visit: Payer: Self-pay

## 2018-10-02 ENCOUNTER — Inpatient Hospital Stay: Payer: Medicare HMO | Admitting: Oncology

## 2018-10-02 ENCOUNTER — Other Ambulatory Visit: Payer: Self-pay | Admitting: *Deleted

## 2018-10-02 ENCOUNTER — Ambulatory Visit
Admission: RE | Admit: 2018-10-02 | Discharge: 2018-10-02 | Disposition: A | Discharge status: Home / Self Care | Payer: Medicare HMO | Source: Ambulatory Visit | Attending: Radiation Oncology | Admitting: Radiation Oncology

## 2018-10-02 ENCOUNTER — Encounter: Payer: Self-pay | Admitting: Radiation Oncology

## 2018-10-02 VITALS — BP 135/81 | HR 71 | Temp 94.9°F | Resp 16 | Wt 144.6 lb

## 2018-10-02 DIAGNOSIS — Z923 Personal history of irradiation: Secondary | ICD-10-CM | POA: Diagnosis not present

## 2018-10-02 DIAGNOSIS — E119 Type 2 diabetes mellitus without complications: Secondary | ICD-10-CM | POA: Diagnosis not present

## 2018-10-02 DIAGNOSIS — I10 Essential (primary) hypertension: Secondary | ICD-10-CM | POA: Diagnosis not present

## 2018-10-02 DIAGNOSIS — Z87891 Personal history of nicotine dependence: Secondary | ICD-10-CM | POA: Diagnosis not present

## 2018-10-02 DIAGNOSIS — C3412 Malignant neoplasm of upper lobe, left bronchus or lung: Secondary | ICD-10-CM | POA: Insufficient documentation

## 2018-10-02 DIAGNOSIS — Z7982 Long term (current) use of aspirin: Secondary | ICD-10-CM

## 2018-10-02 DIAGNOSIS — R911 Solitary pulmonary nodule: Secondary | ICD-10-CM | POA: Insufficient documentation

## 2018-10-02 DIAGNOSIS — R599 Enlarged lymph nodes, unspecified: Secondary | ICD-10-CM | POA: Insufficient documentation

## 2018-10-02 DIAGNOSIS — Z79899 Other long term (current) drug therapy: Secondary | ICD-10-CM

## 2018-10-02 NOTE — Progress Notes (Signed)
Radiation Oncology Follow up Note  Name: Scott Gross   Date:   10/02/2018 MRN:  [id number] DOB: [birth year]    This 84 y.o. male presents to the clinic today for follow-up after PET CT scan for possible progression of disease in the left upper lobe in patient previously treated with SB RT for stage I adenocarcinoma to this region.  REFERRING PROVIDER: [name], MD  HPI: patient is a 84 year old male now seen back after PET/CT scan for discussion of his results. Patient had been treated a most 20 months prior with SB RT to his left upper lobe for stage I adenocarcinoma. CT scan performed.inlate November showed evolving radiation changes in the left upper lobe with several enhancing nodular areas in the region concerning for local recurrent disease. PET CT scan was performed showing again left upper lobe radiation fibrosis although the areas of more focal nodular heart showed hypermetabolic activity suspicious for recurrent or metastatic disease. Hypermetabolic activity in mediastinal right hilar nodes was relative stability from previous exam favored this to be reactive. He continues to do well no dysphagia cough or hemoptysis.  COMPLICATIONS OF TREATMENT: none  FOLLOW UP COMPLIANCE: keeps appointments   PHYSICAL EXAM:  BP 135/81 (BP Location: Left Arm, Patient Position: Sitting)   Pulse 71   Temp (!) 94.9 F (34.9 C) (Tympanic)   Resp 16   Wt 144 lb 10 oz (65.6 kg)   BMI 20.17 kg/m  Well-developed well-nourished patient in NAD. HEENT reveals PERLA, EOMI, discs not visualized.  Oral cavity is clear. No oral mucosal lesions are identified. Neck is clear without evidence of cervical or supraclavicular adenopathy. Lungs are clear to A&P. Cardiac examination is essentially unremarkable with regular rate and rhythm without murmur rub or thrill. Abdomen is benign with no organomegaly or masses noted. Motor sensory and DTR levels are equal and symmetric in the upper and lower extremities.  Cranial nerves II through XII are grossly intact. Proprioception is intact. No peripheral adenopathy or edema is identified. No motor or sensory levels are noted. Crude visual fields are within normal range.  RADIOLOGY RESULTS: CT scans and PET/CT scans reviewed  PLAN: at this time I discussed the case with Dr. Grayland Ormond. Again this would be extremely hard area to biopsy in this patient. We have decided to repeat his CT scans in 3 months should there be progression of these nodular areas I would again retreat with salvage SB RT to this region. Patient is family all comprehend our treatment plan well and agree with our treatment strategy. Follow-up in 3 months with CT scan prior to that visit was scheduled.  I would like to take this opportunity to thank you for allowing me to participate in the care of your patient.Noreene Filbert, MD

## 2018-10-02 NOTE — Progress Notes (Signed)
Patient is here today to follow up on his cancer of upper lobe of left lung. Patient stated that he had no complaints at this time.

## 2019-01-08 ENCOUNTER — Ambulatory Visit: Payer: Medicare HMO

## 2019-01-09 ENCOUNTER — Other Ambulatory Visit: Payer: Self-pay | Admitting: *Deleted

## 2019-01-09 ENCOUNTER — Ambulatory Visit
Admission: RE | Admit: 2019-01-09 | Discharge: 2019-01-09 | Disposition: A | Discharge status: Home / Self Care | Payer: Medicare HMO | Source: Ambulatory Visit | Attending: Radiation Oncology | Admitting: Radiation Oncology

## 2019-01-09 ENCOUNTER — Other Ambulatory Visit: Payer: Self-pay

## 2019-01-09 DIAGNOSIS — C3412 Malignant neoplasm of upper lobe, left bronchus or lung: Secondary | ICD-10-CM

## 2019-01-09 DIAGNOSIS — R918 Other nonspecific abnormal finding of lung field: Secondary | ICD-10-CM | POA: Diagnosis not present

## 2019-01-09 HISTORY — DX: Malignant neoplasm of upper lobe, left bronchus or lung: C34.12

## 2019-01-09 LAB — POCT I-STAT CREATININE: CREATININE: 0.9 mg/dL (ref 0.61–1.24)

## 2019-01-09 MED ORDER — IOHEXOL 300 MG/ML  SOLN
75.0000 mL | Freq: Once | INTRAMUSCULAR | Status: AC | PRN
  Administered 2019-01-09: 75 mL via INTRAVENOUS

## 2019-01-14 ENCOUNTER — Other Ambulatory Visit: Payer: Self-pay

## 2019-01-15 ENCOUNTER — Ambulatory Visit: Payer: Medicare HMO | Admitting: Radiation Oncology

## 2019-01-15 ENCOUNTER — Inpatient Hospital Stay: Payer: Medicare HMO | Attending: Oncology | Admitting: Oncology

## 2019-01-15 DIAGNOSIS — C3412 Malignant neoplasm of upper lobe, left bronchus or lung: Secondary | ICD-10-CM | POA: Diagnosis not present

## 2019-01-15 NOTE — Progress Notes (Signed)
Telephone visit for follow up regarding lung cancer, CT results.

## 2019-01-15 NOTE — Progress Notes (Signed)
[place]  [phone] [phone]) [phone]  ID: TARREN VELARDI OB: 1934-05-07  MR#: [id number]  XFG#:[id number]  Patient Care Team: [name], MD as PCP - General (Family Medicine)  Virtual Visit via Telephone Note  I connected with [name] on 01/15/19 at 10:30 AM EDT by telephone and verified that I am speaking with the correct person using two identifiers.   I discussed the limitations, risks, security and privacy concerns of performing an evaluation and management service by telephone and the availability of in person appointments. I also discussed with the patient that there may be a patient responsible charge related to this service. The patient expressed understanding and agreed to proceed.    CHIEF COMPLAINT: Adenocarcinoma of upper lobe of left lung.  INTERVAL HISTORY: Patient agreed to be evaluated and discuss his imaging results via telephone.  His daughter is on the phone with him.  He currently feels well and is asymptomatic.  He has no neurologic complaints. He denies any chest pain, cough, hemoptysis, or shortness of breath today. He has a good appetite and denies weight loss. He denies any recent fevers or illnesses. He has no nausea, vomiting, constipation, or diarrhea. He has no urinary complaints.  Patient offers no specific complaints today.  REVIEW OF SYSTEMS:   Review of Systems  Constitutional: Negative.  Negative for fever, malaise/fatigue and weight loss.  Respiratory: Negative.  Negative for cough, hemoptysis and shortness of breath.   Cardiovascular: Negative.  Negative for chest pain and leg swelling.  Gastrointestinal: Negative.  Negative for abdominal pain.  Genitourinary: Negative.  Negative for dysuria.  Musculoskeletal: Negative.  Negative for joint pain.  Skin: Negative.  Negative for rash.  Neurological: Negative.  Negative for sensory change, focal weakness, weakness and headaches.  Psychiatric/Behavioral:  The patient is nervous/anxious.     As per HPI. Otherwise, a complete review of systems is negative.  PAST MEDICAL HISTORY: Past Medical History:  Diagnosis Date   Arthritis    CAD (coronary artery disease) Nov. 12, 2012   Inferior ST elevation MI in November of 2012 with ventricular fibrillation arrest. Status post drug-eluting stent placement to the mid LAD. Most recent cardiac catheterization in June of this year showed patent stent with minimal restenosis, 75% proximal LAD and 80% distal LAD which were unchanged from before. Mild disease in the left circumflex and moderate RCA disease. Ejection fraction was 55%.   Cancer of upper lobe of left lung ([place]) 11/2016   Rad tx's.   Cardiac arrest [facility]) Nov. 2012   x 2    Cardiac arrest - ventricular fibrillation    HOH (hard of hearing)    Left Hearing Aid   Hyperlipidemia    Hypertension    Post PTCA 2013   x2   ST elevation MI (STEMI) ([place]) 08/26/2011    PAST SURGICAL HISTORY: Past Surgical History:  Procedure Laterality Date   CARDIAC CATHETERIZATION  2013   @ [place]; [place] s/p stent    CORONARY ANGIOPLASTY     ELECTROMAGNETIC NAVIGATION BROCHOSCOPY N/A 11/27/2016   Procedure: ELECTROMAGNETIC NAVIGATION BRONCHOSCOPY;  Surgeon: [name], MD;  Location: [facility] ORS;  Service: Cardiopulmonary;  Laterality: N/A;    FAMILY HISTORY: Family History  Problem Relation Age of Onset   Heart attack Mother    Heart attack Father     ADVANCED DIRECTIVES (Y/N):  N  HEALTH MAINTENANCE: Social History   Tobacco Use   Smoking status: Former Smoker    Packs/day: 1.00  Types: Cigarettes    Last attempt to quit: 08/27/2011    Years since quitting: 7.3   Smokeless tobacco: Never Used  Substance Use Topics   Alcohol use: No   Drug use: No     Colonoscopy:  PAP:  Bone density:  Lipid panel:  Allergies  Allergen Reactions   Statins Other (See Comments)    Arthralagia    Current Outpatient Medications   Medication Sig Dispense Refill   amLODipine (NORVASC) 5 MG tablet Take 5 mg by mouth as needed. For systolic pressure greater than 150     aspirin 81 MG tablet Take 81 mg by mouth daily. Patient stopped on November 22, 2016 for procedure     furosemide (LASIX) 20 MG tablet   3   labetalol (NORMODYNE) 100 MG tablet      lisinopril-hydrochlorothiazide (PRINZIDE,ZESTORETIC) 20-12.5 MG tablet      tiZANidine (ZANAFLEX) 4 MG tablet   2   No current facility-administered medications for this visit.     OBJECTIVE: There were no vitals filed for this visit.   There is no height or weight on file to calculate BMI.    ECOG FS:0 - Asymptomatic   LAB RESULTS:  Lab Results  Component Value Date   NA 132 (L) 02/10/2018   K 4.0 02/10/2018   CL 95 (L) 02/10/2018   CO2 29 02/10/2018   GLUCOSE 139 (H) 02/10/2018   BUN 9 02/10/2018   CREATININE 0.90 01/09/2019   CALCIUM 9.0 02/10/2018   PROT 7.4 11/28/2013   ALBUMIN 3.9 11/28/2013   AST 33 11/28/2013   ALT 36 11/28/2013   ALKPHOS 99 11/28/2013   BILITOT 0.6 11/28/2013   GFRNONAA >60 02/10/2018   GFRAA >60 02/10/2018    Lab Results  Component Value Date   WBC 8.7 02/10/2018   NEUTROABS 6.0 11/28/2013   HGB 15.4 02/10/2018   HCT 44.0 02/10/2018   MCV 91.7 02/10/2018   PLT 200 02/10/2018     STUDIES: Ct Chest W Contrast  Result Date: 01/09/2019 CLINICAL DATA:  85 year old male with history of left upper lobe lung cancer diagnosed in February 2018 status post SBRT in April 2018. Follow-up study. EXAM: CT CHEST WITH CONTRAST TECHNIQUE: Multidetector CT imaging of the chest was performed during intravenous contrast administration. CONTRAST:  58mL OMNIPAQUE IOHEXOL 300 MG/ML  SOLN COMPARISON:  PET-CT 09/25/2018.  Chest CT 09/10/2018. FINDINGS: Cardiovascular: Heart size is normal. There is no significant pericardial fluid, thickening or pericardial calcification. There is aortic atherosclerosis, as well as atherosclerosis of the great  vessels of the mediastinum and the coronary arteries, including calcified atherosclerotic plaque in the left anterior descending, left circumflex and right coronary arteries. Mediastinum/Nodes: No pathologically enlarged mediastinal or hilar lymph nodes. Esophagus is unremarkable in appearance. No axillary lymphadenopathy. Lungs/Pleura: In addition to a well-defined solid appearing mass-like area of fibrosis in the left upper lobe (axial image 34 of series 3) there are several surrounding nodular appearing areas which continued increase in number and size compared to prior examinations and remained very highly concerning for recurrent disease. The largest of these is in the posterior aspect of the left upper lobe (axial image 41 of series 3) currently measuring 2.6 x 1.6 cm (previously 1.5 x 1.3 cm on 09/25/2018), intimately associated with the adjacent major fissure which appears retracted toward the lesion. These findings are highly concerning for multifocal local recurrence of disease throughout the left upper lobe. No acute consolidative airspace disease. No pleural effusions. Mild diffuse bronchial wall  thickening with mild centrilobular and paraseptal emphysema. Upper Abdomen: Unremarkable. Musculoskeletal: Old healed fracture of the upper sternum. There are no aggressive appearing lytic or blastic lesions noted in the visualized portions of the skeleton. IMPRESSION: 1. Continued increase in number and size of multifocal mixed ground-glass attenuation and solid nodules in the left upper lobe surrounding the area of postradiation mass-like fibrosis, highly concerning for locally recurrent neoplasm. 2. Mild diffuse bronchial wall thickening with mild centrilobular and paraseptal emphysema. 3. Aortic atherosclerosis, in addition to three-vessel coronary artery disease. Aortic Atherosclerosis (ICD10-I70.0) and Emphysema (ICD10-J43.9). Electronically Signed   By: Vinnie Langton M.D.   On: 01/09/2019 09:21     ASSESSMENT:  Adenocarcinoma of upper lobe of left lung   PLAN:   1.  Adenocarcinoma of upper lobe of left lung: Biopsy of lung nodule on November 27, 2016 revealed adenocarcinoma, but there was no invasive component on the sample. Biopsy of mediastinal lymph node had insufficient material. Patient ultimately declined any further biopsies or surgery and elected to proceed with XRT alone which he completed in April 2018.  PET scan results from September 25, 2018 reviewed independently consistent with radiation fibrosis.  Patient's most recent imaging with CT scan on January 09, 2019 reviewed independently and reported as above with a continued increase in number and size of multifocal groundglass and solid nodules concerning for recurrent malignancy.  Will once again get a PET scan next week to further evaluate.  Patient will have a digital health visit either by telephone or WebEx after his PET scan.      I discussed the assessment and treatment plan with the patient. The patient was provided an opportunity to ask questions and all were answered. The patient agreed with the plan and demonstrated an understanding of the instructions.   The patient was advised to call back or seek an in-person evaluation if the symptoms worsen or if the condition fails to improve as anticipated.  I provided 25 minutes of non-face-to-face time during this encounter.   Patient expressed understanding and was in agreement with this plan. He also understands that He can call clinic at any time with any questions, concerns, or complaints.   Cancer Staging No matching staging information was found for the patient.  [name], MD   01/15/2019 6:52 AM

## 2019-01-22 ENCOUNTER — Other Ambulatory Visit: Payer: Self-pay

## 2019-01-22 ENCOUNTER — Ambulatory Visit
Admission: RE | Admit: 2019-01-22 | Discharge: 2019-01-22 | Disposition: A | Discharge status: Home / Self Care | Payer: Medicare HMO | Source: Ambulatory Visit | Attending: Oncology | Admitting: Oncology

## 2019-01-22 DIAGNOSIS — C3412 Malignant neoplasm of upper lobe, left bronchus or lung: Secondary | ICD-10-CM | POA: Insufficient documentation

## 2019-01-22 LAB — GLUCOSE, CAPILLARY: Glucose-Capillary: 124 mg/dL — ABNORMAL HIGH (ref 70–99)

## 2019-01-22 MED ORDER — FLUDEOXYGLUCOSE F - 18 (FDG) INJECTION
7.5000 | Freq: Once | INTRAVENOUS | Status: AC | PRN
  Administered 2019-01-22: 09:00:00 7.94 via INTRAVENOUS

## 2019-01-23 ENCOUNTER — Inpatient Hospital Stay (HOSPITAL_BASED_OUTPATIENT_CLINIC_OR_DEPARTMENT_OTHER): Payer: Medicare HMO | Admitting: Oncology

## 2019-01-23 ENCOUNTER — Encounter: Payer: Self-pay | Admitting: Oncology

## 2019-01-23 DIAGNOSIS — C3412 Malignant neoplasm of upper lobe, left bronchus or lung: Secondary | ICD-10-CM | POA: Diagnosis not present

## 2019-01-23 NOTE — Progress Notes (Signed)
Patient telephone visit today for follow up on PET scan. Patient states he has shoulder pain and headache after PET scan, told patient to try Tylenol as needed for the next 1-2 days.

## 2019-01-23 NOTE — Progress Notes (Signed)
[place]  Telephone:([phone]) [phone] Fax:(336) [phone]  ID: BLASE BECKNER OB: 1934/02/22  MR#: [id number]  YPP#:[id number]  Patient Care Team: [name], MD as PCP - General (Family Medicine)  I connected with [name] on 01/23/19 at  9:30 AM EDT by telephone visit and verified that I am speaking with the correct person using two identifiers.   I discussed the limitations, risks, security and privacy concerns of performing an evaluation and management service by telemedicine and the availability of in-person appointments. I also discussed with the patient that there may be a patient responsible charge related to this service. The patient expressed understanding and agreed to proceed.   Other persons participating in the visit and their role in the encounter: Nursing, patient's daughter.  Patients location: Home Providers location: Clinic   CHIEF COMPLAINT: Adenocarcinoma of upper lobe of left lung.  INTERVAL HISTORY: Patient once again agreed to be evaluated and discuss his imaging results via telephone.  His daughter is on the phone with him.  He continues to feel well and is asymptomatic.  He has no neurologic complaints. He denies any chest pain, cough, hemoptysis, or shortness of breath. He has a good appetite and denies weight loss. He denies any recent fevers or illnesses. He has no nausea, vomiting, constipation, or diarrhea. He has no urinary complaints.  Patient feels at his baseline offers no specific complaints today.  REVIEW OF SYSTEMS:   Review of Systems  Constitutional: Negative.  Negative for fever, malaise/fatigue and weight loss.  Respiratory: Negative.  Negative for cough, hemoptysis and shortness of breath.   Cardiovascular: Negative.  Negative for chest pain and leg swelling.  Gastrointestinal: Negative.  Negative for abdominal pain.  Genitourinary: Negative.  Negative for dysuria.  Musculoskeletal: Negative.  Negative for joint  pain.  Skin: Negative.  Negative for rash.  Neurological: Negative.  Negative for sensory change, focal weakness, weakness and headaches.  Psychiatric/Behavioral: Negative.  The patient is not nervous/anxious.     As per HPI. Otherwise, a complete review of systems is negative.  PAST MEDICAL HISTORY: Past Medical History:  Diagnosis Date   Arthritis    CAD (coronary artery disease) Nov. 12, 2012   Inferior ST elevation MI in November of 2012 with ventricular fibrillation arrest. Status post drug-eluting stent placement to the mid LAD. Most recent cardiac catheterization in June of this year showed patent stent with minimal restenosis, 75% proximal LAD and 80% distal LAD which were unchanged from before. Mild disease in the left circumflex and moderate RCA disease. Ejection fraction was 55%.   Cancer of upper lobe of left lung ([place]) 11/2016   Rad tx's.   Cardiac arrest [facility]) Nov. 2012   x 2    Cardiac arrest - ventricular fibrillation    HOH (hard of hearing)    Left Hearing Aid   Hyperlipidemia    Hypertension    Post PTCA 2013   x2   ST elevation MI (STEMI) ([place]) 08/26/2011    PAST SURGICAL HISTORY: Past Surgical History:  Procedure Laterality Date   CARDIAC CATHETERIZATION  2013   @ [place]; [place] s/p stent    CORONARY ANGIOPLASTY     ELECTROMAGNETIC NAVIGATION BROCHOSCOPY N/A 11/27/2016   Procedure: ELECTROMAGNETIC NAVIGATION BRONCHOSCOPY;  Surgeon: [name], MD;  Location: [facility] ORS;  Service: Cardiopulmonary;  Laterality: N/A;    FAMILY HISTORY: Family History  Problem Relation Age of Onset   Heart attack Mother    Heart attack Father  ADVANCED DIRECTIVES (Y/N):  N  HEALTH MAINTENANCE: Social History   Tobacco Use   Smoking status: Former Smoker    Packs/day: 1.00    Types: Cigarettes    Last attempt to quit: 08/27/2011    Years since quitting: 7.4   Smokeless tobacco: Never Used  Substance Use Topics   Alcohol use: No   Drug use:  No     Colonoscopy:  PAP:  Bone density:  Lipid panel:  Allergies  Allergen Reactions   Statins Other (See Comments)    Arthralagia    Current Outpatient Medications  Medication Sig Dispense Refill   amLODipine (NORVASC) 5 MG tablet Take 5 mg by mouth as needed. For systolic pressure greater than 150     aspirin 81 MG tablet Take 81 mg by mouth daily. Patient stopped on November 22, 2016 for procedure     labetalol (NORMODYNE) 100 MG tablet      lisinopril-hydrochlorothiazide (PRINZIDE,ZESTORETIC) 20-25 MG tablet Take 1 tablet by mouth 2 (two) times daily.     No current facility-administered medications for this visit.     OBJECTIVE: There were no vitals filed for this visit.   There is no height or weight on file to calculate BMI.    ECOG FS:0 - Asymptomatic   LAB RESULTS:  Lab Results  Component Value Date   NA 132 (L) 02/10/2018   K 4.0 02/10/2018   CL 95 (L) 02/10/2018   CO2 29 02/10/2018   GLUCOSE 139 (H) 02/10/2018   BUN 9 02/10/2018   CREATININE 0.90 01/09/2019   CALCIUM 9.0 02/10/2018   PROT 7.4 11/28/2013   ALBUMIN 3.9 11/28/2013   AST 33 11/28/2013   ALT 36 11/28/2013   ALKPHOS 99 11/28/2013   BILITOT 0.6 11/28/2013   GFRNONAA >60 02/10/2018   GFRAA >60 02/10/2018    Lab Results  Component Value Date   WBC 8.7 02/10/2018   NEUTROABS 6.0 11/28/2013   HGB 15.4 02/10/2018   HCT 44.0 02/10/2018   MCV 91.7 02/10/2018   PLT 200 02/10/2018     STUDIES: Ct Chest W Contrast  Result Date: 01/09/2019 CLINICAL DATA:  85 year old male with history of left upper lobe lung cancer diagnosed in February 2018 status post SBRT in April 2018. Follow-up study. EXAM: CT CHEST WITH CONTRAST TECHNIQUE: Multidetector CT imaging of the chest was performed during intravenous contrast administration. CONTRAST:  74mL OMNIPAQUE IOHEXOL 300 MG/ML  SOLN COMPARISON:  PET-CT 09/25/2018.  Chest CT 09/10/2018. FINDINGS: Cardiovascular: Heart size is normal. There is no  significant pericardial fluid, thickening or pericardial calcification. There is aortic atherosclerosis, as well as atherosclerosis of the great vessels of the mediastinum and the coronary arteries, including calcified atherosclerotic plaque in the left anterior descending, left circumflex and right coronary arteries. Mediastinum/Nodes: No pathologically enlarged mediastinal or hilar lymph nodes. Esophagus is unremarkable in appearance. No axillary lymphadenopathy. Lungs/Pleura: In addition to a well-defined solid appearing mass-like area of fibrosis in the left upper lobe (axial image 34 of series 3) there are several surrounding nodular appearing areas which continued increase in number and size compared to prior examinations and remained very highly concerning for recurrent disease. The largest of these is in the posterior aspect of the left upper lobe (axial image 41 of series 3) currently measuring 2.6 x 1.6 cm (previously 1.5 x 1.3 cm on 09/25/2018), intimately associated with the adjacent major fissure which appears retracted toward the lesion. These findings are highly concerning for multifocal local recurrence of disease throughout  the left upper lobe. No acute consolidative airspace disease. No pleural effusions. Mild diffuse bronchial wall thickening with mild centrilobular and paraseptal emphysema. Upper Abdomen: Unremarkable. Musculoskeletal: Old healed fracture of the upper sternum. There are no aggressive appearing lytic or blastic lesions noted in the visualized portions of the skeleton. IMPRESSION: 1. Continued increase in number and size of multifocal mixed ground-glass attenuation and solid nodules in the left upper lobe surrounding the area of postradiation mass-like fibrosis, highly concerning for locally recurrent neoplasm. 2. Mild diffuse bronchial wall thickening with mild centrilobular and paraseptal emphysema. 3. Aortic atherosclerosis, in addition to three-vessel coronary artery disease.  Aortic Atherosclerosis (ICD10-I70.0) and Emphysema (ICD10-J43.9). Electronically Signed   By: Vinnie Langton M.D.   On: 01/09/2019 09:21   Nm Pet Image Restag (ps) Skull Base To Thigh  Result Date: 01/22/2019 CLINICAL DATA:  Subsequent treatment strategy for left upper lobe lung cancer. EXAM: NUCLEAR MEDICINE PET SKULL BASE TO THIGH TECHNIQUE: 7.9 mCi F-18 FDG was injected intravenously. Full-ring PET imaging was performed from the skull base to thigh after the radiotracer. CT data was obtained and used for attenuation correction and anatomic localization. Fasting blood glucose: 124 mg/dl COMPARISON:  Multiple exams, including 09/25/2018 and CT chest from 01/09/2019 FINDINGS: Mediastinal blood pool activity: SUV max 2.3 NECK: No significant abnormal hypermetabolic activity in this region. Incidental CT findings: Unremarkable CHEST: Left suprahilar mass and airspace opacity noted. The posterior lobulation of this process measures about 2.3 cm in thickness, similar to the recent CT scan on 01/09/2019, and has a maximum SUV of 8.8 (formerly 6.5). The more anterior inferior portion of this airspace opacity is likewise stable compared to the recent CT, and has a maximum SUV of 6.3, formerly 5.5. The anterior superior portion of this airspace opacity is lower in activity, maximum SUV 2.9, previously 2.6. Overall the lesion has significantly increased in metabolic activity and has mildly enlarged compared to the prior PET-CT. A right paratracheal lymph node measuring 0.9 cm in short axis has a maximum SUV of 2.7, formerly 3.2. Indistinct right hilar nodal activity has a maximum SUV of 3.4, formerly 3.5. Incidental CT findings: Coronary, aortic arch, and branch vessel atherosclerotic vascular disease. Centrilobular emphysema. ABDOMEN/PELVIS: A small focus of hypermetabolic activity the rectosigmoid junction has a maximum SUV of 3.9, and a similar finding on the prior PET-CT had a maximum SUV of 4.6. I am not certain  that this is the same lesion but a small polyp might present this way. Incidental CT findings: Aortoiliac atherosclerotic vascular disease. Prostatomegaly. SKELETON: No significant abnormal hypermetabolic activity in this region. Incidental CT findings: Unremarkable IMPRESSION: 1. Confluent left suprahilar mass likely represents combination of prior therapy related findings and underlying malignancy. The components of this mass have increased in metabolic activity compared to the prior exam, and the overall mass has a maximum SUV of 8.8 (formerly 6.5). 2. Faintly accentuated metabolic activity in single right paratracheal and right hilar lymph nodes are observed, both minimally reduced in maximum SUV compared to prior exam. 3. There is a small focus of hypermetabolic activity near the junction of the rectum and the sigmoid colon in a similar location to the prior exam. The maximum SUV is only 3.9, but this could reflect a small polyp. 4. Other imaging findings of potential clinical significance: Aortic Atherosclerosis (ICD10-I70.0) and Emphysema (ICD10-J43.9). Prostatomegaly. Electronically Signed   By: Van Clines M.D.   On: 01/22/2019 14:53    ASSESSMENT:  Adenocarcinoma of upper lobe of left lung  PLAN:   1.  Adenocarcinoma of upper lobe of left lung: Biopsy of lung nodule on November 27, 2016 revealed adenocarcinoma, but there was no invasive component on the sample. Biopsy of mediastinal lymph node had insufficient material. Patient ultimately declined any further biopsies or surgery and elected to proceed with XRT alone which he completed in April 2018.  PET scan results from September 25, 2018 reviewed independently consistent with radiation fibrosis.  Patient's most recent imaging with CT scan on January 09, 2019 reviewed independently and reported as above with a continued increase in number and size of multifocal groundglass and solid nodules concerning for recurrent malignancy.  Repeat PET  scan on January 22, 2019 reviewed independently and report as above with increased metabolic activity prior to previous with SUV increasing from 6.5 up to 8.8.  Current plan is to repeat PET scan in 3 months to continue to monitoring through the COVID-19 pandemic, but will further discuss patient at tumor board next week to get additional input.  I provided 25 minutes of non face-to-face telephone visit time during this encounter, and > 50% was spent counseling as documented under my assessment & plan.  Patient expressed understanding and was in agreement with this plan. He also understands that He can call clinic at any time with any questions, concerns, or complaints.   Cancer Staging No matching staging information was found for the patient.  [name], MD   01/23/2019 2:28 PM

## 2019-01-29 ENCOUNTER — Other Ambulatory Visit: Payer: Medicare HMO

## 2019-01-29 NOTE — Progress Notes (Signed)
Tumor Board Documentation  Scott Gross was presented by Dr Grayland Ormond at our Tumor Board on 01/29/2019, which included representatives from medical oncology, radiation oncology, surgical oncology, surgical, radiology, pathology, navigation, genetics, pharmacy, internal medicine, research, palliative care.  Scott Gross currently presents as a current patient, for discussion, for [place] with history of the following treatments: neoadjuvant radiation.  Additionally, we reviewed previous medical and familial history, history of present illness, and recent lab results along with all available histopathologic and imaging studies. The tumor board considered available treatment options and made the following recommendations: Biopsy, Active surveillance Brochoscopic biopsy vs repeat CT in 3 months  The following procedures/referrals were also placed: No orders of the defined types were placed in this encounter.   Clinical Trial Status: not discussed   Staging used: AJCC Stage Group  AJCC Staging:       Group: Stage 1 A lung cancer   National site-specific guidelines NCCN were discussed with respect to the case.  Tumor board is a meeting of clinicians from various specialty areas who evaluate and discuss patients for whom a multidisciplinary approach is being considered. Final determinations in the plan of care are those of the provider(s). The responsibility for follow up of recommendations given during tumor board is that of the provider.   Today's extended care, comprehensive team conference, [name] was not present for the discussion and was not examined.   Multidisciplinary Tumor Board is a multidisciplinary case peer review process.  Decisions discussed in the Multidisciplinary Tumor Board reflect the opinions of the specialists present at the conference without having examined the patient.  Ultimately, treatment and diagnostic decisions rest with the primary provider(s) and the patient.

## 2019-02-02 ENCOUNTER — Other Ambulatory Visit: Payer: Self-pay | Admitting: *Deleted

## 2019-02-02 ENCOUNTER — Telehealth: Payer: Self-pay | Admitting: *Deleted

## 2019-02-02 DIAGNOSIS — C3412 Malignant neoplasm of upper lobe, left bronchus or lung: Secondary | ICD-10-CM

## 2019-02-02 NOTE — Telephone Encounter (Signed)
Call placed to patient to discuss recommendations made at [facility] on 4/16. It was recommended patient either have CT scan in 3 months or bronchoscopy. Patient has decided he would like to proceed with CT scan in 3 months, declines bronchoscopy at this time. PET scan to be cancelled and CT scan scheduled. Scheduling message has been sent. Patient verbalized understanding of plan.

## 2019-03-03 DIAGNOSIS — I10 Essential (primary) hypertension: Secondary | ICD-10-CM | POA: Diagnosis not present

## 2019-03-03 DIAGNOSIS — I2510 Atherosclerotic heart disease of native coronary artery without angina pectoris: Secondary | ICD-10-CM | POA: Diagnosis not present

## 2019-03-03 DIAGNOSIS — R9431 Abnormal electrocardiogram [ECG] [EKG]: Secondary | ICD-10-CM | POA: Diagnosis not present

## 2019-03-03 DIAGNOSIS — I739 Peripheral vascular disease, unspecified: Secondary | ICD-10-CM | POA: Diagnosis not present

## 2019-03-03 DIAGNOSIS — I208 Other forms of angina pectoris: Secondary | ICD-10-CM | POA: Diagnosis not present

## 2019-03-03 DIAGNOSIS — C3412 Malignant neoplasm of upper lobe, left bronchus or lung: Secondary | ICD-10-CM | POA: Diagnosis not present

## 2019-03-03 DIAGNOSIS — I701 Atherosclerosis of renal artery: Secondary | ICD-10-CM | POA: Diagnosis not present

## 2019-03-03 DIAGNOSIS — E119 Type 2 diabetes mellitus without complications: Secondary | ICD-10-CM | POA: Diagnosis not present

## 2019-03-03 DIAGNOSIS — E785 Hyperlipidemia, unspecified: Secondary | ICD-10-CM | POA: Diagnosis not present

## 2019-04-18 NOTE — Progress Notes (Signed)
[facility]  [phone] Fax:([phone]) [phone]  ID: Scott Gross OB: Jul 01, 1934  MR#: [id number]  DJM#:[id number]  Patient Care Team: [name], MD as PCP - General (Family Medicine)  I connected with Scott Gross on 04/26/19 at 10:30 AM EDT by telephone visit and verified that I am speaking with the correct person using two identifiers.   I discussed the limitations, risks, security and privacy concerns of performing an evaluation and management service by telemedicine and the availability of in-person appointments. I also discussed with the patient that there may be a patient responsible charge related to this service. The patient expressed understanding and agreed to proceed.   Other persons participating in the visit and their role in the encounter: Patient, patient's wife and daughter, MD  Patient's location: Home Provider's location: Clinic   CHIEF COMPLAINT: Adenocarcinoma of upper lobe of left lung.  INTERVAL HISTORY: Patient once again agreed to evaluation and discussion of his imaging results via telephone.  His daughter and wife are on the phone with him.  He continues to feel well and remains asymptomatic.  He has no neurologic complaints. He denies any chest pain, cough, hemoptysis, or shortness of breath. He has a good appetite and denies weight loss. He denies any recent fevers or illnesses. He has no nausea, vomiting, constipation, or diarrhea. He has no urinary complaints.  Patient offers no specific complaints today.  REVIEW OF SYSTEMS:   Review of Systems  Constitutional: Negative.  Negative for fever, malaise/fatigue and weight loss.  Respiratory: Negative.  Negative for cough, hemoptysis and shortness of breath.   Cardiovascular: Negative.  Negative for chest pain and leg swelling.  Gastrointestinal: Negative.  Negative for abdominal pain.  Genitourinary: Negative.  Negative for dysuria.  Musculoskeletal: Negative.  Negative  for joint pain.  Skin: Negative.  Negative for rash.  Neurological: Negative.  Negative for sensory change, focal weakness, weakness and headaches.  Psychiatric/Behavioral: Negative.  The patient is not nervous/anxious.     As per HPI. Otherwise, a complete review of systems is negative.  PAST MEDICAL HISTORY: Past Medical History:  Diagnosis Date  . Arthritis   . CAD (coronary artery disease) Nov. 12, 2012   Inferior ST elevation MI in November of 2012 with ventricular fibrillation arrest. Status post drug-eluting stent placement to the mid LAD. Most recent cardiac catheterization in June of this year showed patent stent with minimal restenosis, 75% proximal LAD and 80% distal LAD which were unchanged from before. Mild disease in the left circumflex and moderate RCA disease. Ejection fraction was 55%.  . Cancer of upper lobe of left lung ([place]) 11/2016   Rad tx's.  . Cardiac arrest [facility]) Nov. 2012   x 2   . Cardiac arrest - ventricular fibrillation   . HOH (hard of hearing)    Left Hearing Aid  . Hyperlipidemia   . Hypertension   . Post PTCA 2013   x2  . ST elevation MI (STEMI) ([place]) 08/26/2011    PAST SURGICAL HISTORY: Past Surgical History:  Procedure Laterality Date  . CARDIAC CATHETERIZATION  2013   @ [place]; [facility] s/p stent   . CORONARY ANGIOPLASTY    . ELECTROMAGNETIC NAVIGATION BROCHOSCOPY N/A 11/27/2016   Procedure: ELECTROMAGNETIC NAVIGATION BRONCHOSCOPY;  Surgeon: [name], MD;  Location: [facility] ORS;  Service: Cardiopulmonary;  Laterality: N/A;    FAMILY HISTORY: Family History  Problem Relation Age of Onset  . Heart attack Mother   . Heart attack Father  ADVANCED DIRECTIVES (Y/N):  N  HEALTH MAINTENANCE: Social History   Tobacco Use  . Smoking status: Former Smoker    Packs/day: 1.00    Types: Cigarettes    Quit date: 08/27/2011    Years since quitting: 7.6  . Smokeless tobacco: Never Used  Substance Use Topics  . Alcohol use: No  . Drug use:  No     Colonoscopy:  PAP:  Bone density:  Lipid panel:  Allergies  Allergen Reactions  . Statins Other (See Comments)    Arthralagia    Current Outpatient Medications  Medication Sig Dispense Refill  . amLODipine (NORVASC) 5 MG tablet Take 5 mg by mouth as needed. For systolic pressure greater than 150    . aspirin 81 MG tablet Take 81 mg by mouth daily. Patient stopped on November 22, 2016 for procedure    . labetalol (NORMODYNE) 100 MG tablet     . lisinopril-hydrochlorothiazide (PRINZIDE,ZESTORETIC) 20-25 MG tablet Take 1 tablet by mouth 2 (two) times daily.     No current facility-administered medications for this visit.     OBJECTIVE: There were no vitals filed for this visit.   There is no height or weight on file to calculate BMI.    ECOG FS:0 - Asymptomatic   LAB RESULTS:  Lab Results  Component Value Date   NA 132 (L) 02/10/2018   K 4.0 02/10/2018   CL 95 (L) 02/10/2018   CO2 29 02/10/2018   GLUCOSE 139 (H) 02/10/2018   BUN 9 02/10/2018   CREATININE 0.80 04/21/2019   CALCIUM 9.0 02/10/2018   PROT 7.4 11/28/2013   ALBUMIN 3.9 11/28/2013   AST 33 11/28/2013   ALT 36 11/28/2013   ALKPHOS 99 11/28/2013   BILITOT 0.6 11/28/2013   GFRNONAA >60 02/10/2018   GFRAA >60 02/10/2018    Lab Results  Component Value Date   WBC 8.7 02/10/2018   NEUTROABS 6.0 11/28/2013   HGB 15.4 02/10/2018   HCT 44.0 02/10/2018   MCV 91.7 02/10/2018   PLT 200 02/10/2018     STUDIES: Ct Chest W Contrast  Result Date: 04/21/2019 CLINICAL DATA:  Left lung cancer, diagnosed 2018 EXAM: CT CHEST WITH CONTRAST TECHNIQUE: Multidetector CT imaging of the chest was performed during intravenous contrast administration. CONTRAST:  44mL OMNIPAQUE IOHEXOL 300 MG/ML  SOLN COMPARISON:  PET-CT dated 01/22/2019 FINDINGS: Cardiovascular: Heart is normal in size.  No pericardial effusion. No evidence thoracic aortic aneurysm. Mild atherosclerotic calcifications of the aortic arch. Coronary  atherosclerosis of the LAD and right coronary artery. Mediastinum/Nodes: 8 mm short axis high right paratracheal node (series 2/image 83), unchanged. Additional small mediastinal lymph nodes. 8 mm short axis right hilar node (series 2/image 37), unchanged. Visualized thyroid is unremarkable. Lungs/Pleura: Progressive 5.4 x 6.4 cm mixed solid/cavitary mass in the medial left upper lobe (series 3/image 44). Previous multicentric nature is now coalescent around a dominant central 4.0 x 2.9 cm solid component (series 2/image 44) which is largely new. Anterolateral curvilinear solid component adjacent to the fiducial markers are unchanged (series 3/image 37), possibly related to the patient's known radiation changes. Inferolateral subsolid component is similar but favored to be mildly progressive with involvement posteriorly of the fissure (series 3/image 50). Mild centrilobular emphysematous changes, upper lung predominant. Trace pleural fluid/studding along the posterior right hemithorax (series 2/image 100), unchanged. No pneumothorax. Upper Abdomen: Visualized upper abdomen is grossly unremarkable. Musculoskeletal: Mild degenerative changes of the visualized thoracolumbar spine. IMPRESSION: Progressive 6.4 cm mixed solid/cavitary mass in the medial  left upper lobe, now with dominant 4.0 cm central solid component, as above. This remains compatible with recurrent tumor. Small mediastinal/right hilar nodes, indeterminate. Aortic Atherosclerosis (ICD10-I70.0) and Emphysema (ICD10-J43.9). Electronically Signed   By: Julian Hy M.D.   On: 04/21/2019 10:55    ASSESSMENT:  Adenocarcinoma of upper lobe of left lung   PLAN:   1.  Adenocarcinoma of upper lobe of left lung: Biopsy of lung nodule on November 27, 2016 revealed adenocarcinoma, but there was no invasive component on the sample. Biopsy of mediastinal lymph node had insufficient material. Patient ultimately declined any further biopsies or surgery and  elected to proceed with XRT alone which he completed in April 2018.  PET scan on January 22, 2019 reviewed independently with increased metabolic activity prior to previous with SUV increasing from 6.5 up to 8.8.  Patient declined intervention at that time and elected to proceed with simple observation.  Repeat CT scan on April 21, 2019 reviewed independently and reported as above with progressive 6.4 cm mass in the left upper lobe.  This is consistent with progression of disease.  Given patient's advanced age and multiple comorbidities, he likely would not tolerate chemotherapy, but a referral has been made back to radiation oncology for consideration of treatment.  Follow-up will depend on radiation oncology evaluation.   I provided 25 minutes of non face-to-face telephone visit time during this encounter, and > 50% was spent counseling as documented under my assessment & plan.   Patient expressed understanding and was in agreement with this plan. He also understands that He can call clinic at any time with any questions, concerns, or complaints.   Cancer Staging No matching staging information was found for the patient.  [name], MD   04/26/2019 9:20 AM

## 2019-04-21 ENCOUNTER — Other Ambulatory Visit: Payer: Self-pay

## 2019-04-21 ENCOUNTER — Ambulatory Visit
Admission: RE | Admit: 2019-04-21 | Discharge: 2019-04-21 | Disposition: A | Discharge status: Home / Self Care | Payer: Medicare HMO | Source: Ambulatory Visit | Attending: Oncology | Admitting: Oncology

## 2019-04-21 ENCOUNTER — Other Ambulatory Visit: Payer: Medicare HMO

## 2019-04-21 DIAGNOSIS — C3412 Malignant neoplasm of upper lobe, left bronchus or lung: Secondary | ICD-10-CM | POA: Diagnosis not present

## 2019-04-21 DIAGNOSIS — C3492 Malignant neoplasm of unspecified part of left bronchus or lung: Secondary | ICD-10-CM | POA: Diagnosis not present

## 2019-04-21 LAB — POCT I-STAT CREATININE: Creatinine, Ser: 0.8 mg/dL (ref 0.61–1.24)

## 2019-04-21 MED ORDER — IOHEXOL 300 MG/ML  SOLN
75.0000 mL | Freq: Once | INTRAMUSCULAR | Status: AC | PRN
  Administered 2019-04-21: 75 mL via INTRAVENOUS

## 2019-04-24 ENCOUNTER — Other Ambulatory Visit: Payer: Self-pay

## 2019-04-24 ENCOUNTER — Inpatient Hospital Stay: Payer: Medicare HMO | Attending: Oncology | Admitting: Oncology

## 2019-04-24 ENCOUNTER — Encounter: Payer: Self-pay | Admitting: Oncology

## 2019-04-24 DIAGNOSIS — Z87891 Personal history of nicotine dependence: Secondary | ICD-10-CM | POA: Diagnosis not present

## 2019-04-24 DIAGNOSIS — Z79899 Other long term (current) drug therapy: Secondary | ICD-10-CM

## 2019-04-24 DIAGNOSIS — C3412 Malignant neoplasm of upper lobe, left bronchus or lung: Secondary | ICD-10-CM

## 2019-04-24 DIAGNOSIS — Z7982 Long term (current) use of aspirin: Secondary | ICD-10-CM

## 2019-04-24 DIAGNOSIS — Z79810 Long term (current) use of selective estrogen receptor modulators (SERMs): Secondary | ICD-10-CM

## 2019-04-24 NOTE — Progress Notes (Signed)
Patient stated that he has had tingling and numbness on the the bottom of his feet. Patient stated that he had a fever last night and this AM he is feeling fine.

## 2019-04-27 ENCOUNTER — Encounter: Payer: Self-pay | Admitting: Radiation Oncology

## 2019-04-27 ENCOUNTER — Other Ambulatory Visit: Payer: Self-pay

## 2019-04-27 ENCOUNTER — Other Ambulatory Visit: Payer: Self-pay | Admitting: *Deleted

## 2019-04-27 ENCOUNTER — Ambulatory Visit
Admission: RE | Admit: 2019-04-27 | Discharge: 2019-04-27 | Disposition: A | Discharge status: Home / Self Care | Payer: Medicare HMO | Source: Ambulatory Visit | Attending: Radiation Oncology | Admitting: Radiation Oncology

## 2019-04-27 VITALS — BP 126/77 | HR 88 | Temp 97.3°F | Resp 18 | Wt 142.7 lb

## 2019-04-27 DIAGNOSIS — Z87891 Personal history of nicotine dependence: Secondary | ICD-10-CM | POA: Insufficient documentation

## 2019-04-27 DIAGNOSIS — R55 Syncope and collapse: Secondary | ICD-10-CM | POA: Insufficient documentation

## 2019-04-27 DIAGNOSIS — I208 Other forms of angina pectoris: Secondary | ICD-10-CM | POA: Diagnosis not present

## 2019-04-27 DIAGNOSIS — C3412 Malignant neoplasm of upper lobe, left bronchus or lung: Secondary | ICD-10-CM | POA: Diagnosis not present

## 2019-04-27 DIAGNOSIS — I739 Peripheral vascular disease, unspecified: Secondary | ICD-10-CM | POA: Diagnosis not present

## 2019-04-27 DIAGNOSIS — I10 Essential (primary) hypertension: Secondary | ICD-10-CM | POA: Diagnosis not present

## 2019-04-27 DIAGNOSIS — Z923 Personal history of irradiation: Secondary | ICD-10-CM | POA: Diagnosis not present

## 2019-04-27 DIAGNOSIS — E119 Type 2 diabetes mellitus without complications: Secondary | ICD-10-CM | POA: Diagnosis not present

## 2019-04-27 DIAGNOSIS — R9431 Abnormal electrocardiogram [ECG] [EKG]: Secondary | ICD-10-CM | POA: Diagnosis not present

## 2019-04-27 DIAGNOSIS — I701 Atherosclerosis of renal artery: Secondary | ICD-10-CM | POA: Diagnosis not present

## 2019-04-27 DIAGNOSIS — I2510 Atherosclerotic heart disease of native coronary artery without angina pectoris: Secondary | ICD-10-CM | POA: Diagnosis not present

## 2019-04-27 NOTE — Progress Notes (Signed)
Mr

## 2019-04-27 NOTE — Progress Notes (Signed)
Radiation Oncology Follow up Note old patient new area progressive disease in left lung  Name: Scott Gross   Date:   04/27/2019 MRN:  [id number] DOB: [birth year]    This 85 y.o. male presents to the clinic today for progressive disease and left lung and area previously treated.  Back approximately 2 years prior with SBRT to the same region for stage I adenocarcinoma  REFERRING PROVIDER: [name], MD  HPI: Patient is a.  85 year old male previously treated over 2 years prior with SBRT to his left upper lobe for stage I adenocarcinoma.  He started about a year ago showing evolving radiation changes in the left upper lobe with several enhancing nodular areas in the region concerning for local regional disease.  PET CT scan of the left upper lobe showed radiation fibrosis although the there was areas of more focal nodularity with hypermetabolic activity suspicious for recurrent disease.  Since this area based on the patient's advanced age and overall medical condition would be hard to biopsy we decided to wait 3 months and repeat imaging of this area.  Repeat CT scan this month showed now a 6.4 cm mixed solid cavitary mass in the medial left upper lobe with a 4 cm dominant central solid component compatible with recurrent tumor.  This opinion is shared by medical oncology and is now referred back to radiation oncology for consideration of salvage treatment.  He is doing fairly well he has had some syncopal episodes according to his daughter with possibly some minor petit mall seizures.  I am obtaining an MRI scan of his brain.  He specifically denies hemoptysis or chest tightness does have a mild nonproductive cough.  COMPLICATIONS OF TREATMENT: none  FOLLOW UP COMPLIANCE: keeps appointments   PHYSICAL EXAM:  BP 126/77 (BP Location: Left Arm, Patient Position: Sitting)   Pulse 88   Temp (!) 97.3 F (36.3 C) (Tympanic)   Resp 18   Wt 142 lb 12 oz (64.8 kg)   BMI 19.91 kg/m   Well-developed well-nourished patient in NAD. HEENT reveals PERLA, EOMI, discs not visualized.  Oral cavity is clear. No oral mucosal lesions are identified. Neck is clear without evidence of cervical or supraclavicular adenopathy. Lungs are clear to A&P. Cardiac examination is essentially unremarkable with regular rate and rhythm without murmur rub or thrill. Abdomen is benign with no organomegaly or masses noted. Motor sensory and DTR levels are equal and symmetric in the upper and lower extremities. Cranial nerves II through XII are grossly intact. Proprioception is intact. No peripheral adenopathy or edema is identified. No motor or sensory levels are noted. Crude visual fields are within normal range.  RADIOLOGY RESULTS: CT scans reviewed and compatible with above-stated findings  PLAN: At this time elected go ahead with salvage radiation therapy to his left hilar region.  Would plan on delivering 6000 cGy in 10 fractions.  Risks and benefits of treatment including the potential for lung necrosis following salvage radiation, fatigue alteration of blood counts development of a cough possible radiation esophagitis all were discussed in detail.  We will be highly conforming to her radiation fields and I would choose IMRT treatment planning and delivery with 4-dimensional treatment planning as well as motion restriction during treatment.  Patient comprehends our treatment plan well.  I have personally set up and ordered CT simulation with PET/CT fusion later this week.  I would like to take this opportunity to thank you for allowing me to participate in the care of your patient.Marland Kitchen  Noreene Filbert, MD

## 2019-04-28 ENCOUNTER — Other Ambulatory Visit: Payer: Self-pay

## 2019-04-29 ENCOUNTER — Ambulatory Visit
Admission: RE | Admit: 2019-04-29 | Discharge: 2019-04-29 | Disposition: A | Discharge status: Home / Self Care | Payer: Medicare HMO | Source: Ambulatory Visit | Attending: Radiation Oncology | Admitting: Radiation Oncology

## 2019-04-29 ENCOUNTER — Other Ambulatory Visit: Payer: Self-pay

## 2019-04-29 DIAGNOSIS — Z510 Encounter for antineoplastic radiation therapy: Secondary | ICD-10-CM | POA: Insufficient documentation

## 2019-04-29 DIAGNOSIS — C3412 Malignant neoplasm of upper lobe, left bronchus or lung: Secondary | ICD-10-CM | POA: Insufficient documentation

## 2019-04-29 DIAGNOSIS — Z87891 Personal history of nicotine dependence: Secondary | ICD-10-CM | POA: Diagnosis not present

## 2019-05-01 DIAGNOSIS — Z87891 Personal history of nicotine dependence: Secondary | ICD-10-CM | POA: Diagnosis not present

## 2019-05-01 DIAGNOSIS — C3412 Malignant neoplasm of upper lobe, left bronchus or lung: Secondary | ICD-10-CM | POA: Diagnosis not present

## 2019-05-01 DIAGNOSIS — Z510 Encounter for antineoplastic radiation therapy: Secondary | ICD-10-CM | POA: Diagnosis not present

## 2019-05-07 ENCOUNTER — Other Ambulatory Visit: Payer: Self-pay

## 2019-05-07 ENCOUNTER — Ambulatory Visit
Admission: RE | Admit: 2019-05-07 | Discharge: 2019-05-07 | Disposition: A | Discharge status: Home / Self Care | Payer: Medicare HMO | Source: Ambulatory Visit | Attending: Radiation Oncology | Admitting: Radiation Oncology

## 2019-05-07 DIAGNOSIS — C3490 Malignant neoplasm of unspecified part of unspecified bronchus or lung: Secondary | ICD-10-CM | POA: Diagnosis not present

## 2019-05-07 DIAGNOSIS — C3412 Malignant neoplasm of upper lobe, left bronchus or lung: Secondary | ICD-10-CM | POA: Insufficient documentation

## 2019-05-07 MED ORDER — GADOBUTROL 1 MMOL/ML IV SOLN
6.0000 mL | Freq: Once | INTRAVENOUS | Status: AC | PRN
  Administered 2019-05-07: 6 mL via INTRAVENOUS

## 2019-05-11 ENCOUNTER — Ambulatory Visit
Admission: RE | Admit: 2019-05-11 | Discharge: 2019-05-11 | Disposition: A | Discharge status: Home / Self Care | Payer: Medicare HMO | Source: Ambulatory Visit | Attending: Radiation Oncology | Admitting: Radiation Oncology

## 2019-05-11 ENCOUNTER — Other Ambulatory Visit: Payer: Self-pay

## 2019-05-11 DIAGNOSIS — C3412 Malignant neoplasm of upper lobe, left bronchus or lung: Secondary | ICD-10-CM | POA: Diagnosis not present

## 2019-05-11 DIAGNOSIS — Z87891 Personal history of nicotine dependence: Secondary | ICD-10-CM | POA: Diagnosis not present

## 2019-05-11 DIAGNOSIS — Z510 Encounter for antineoplastic radiation therapy: Secondary | ICD-10-CM | POA: Diagnosis not present

## 2019-05-12 ENCOUNTER — Other Ambulatory Visit: Payer: Self-pay

## 2019-05-12 ENCOUNTER — Ambulatory Visit
Admission: RE | Admit: 2019-05-12 | Discharge: 2019-05-12 | Disposition: A | Discharge status: Home / Self Care | Payer: Medicare HMO | Source: Ambulatory Visit | Attending: Radiation Oncology | Admitting: Radiation Oncology

## 2019-05-12 DIAGNOSIS — Z87891 Personal history of nicotine dependence: Secondary | ICD-10-CM | POA: Diagnosis not present

## 2019-05-12 DIAGNOSIS — C3412 Malignant neoplasm of upper lobe, left bronchus or lung: Secondary | ICD-10-CM | POA: Diagnosis not present

## 2019-05-12 DIAGNOSIS — Z510 Encounter for antineoplastic radiation therapy: Secondary | ICD-10-CM | POA: Diagnosis not present

## 2019-05-13 ENCOUNTER — Other Ambulatory Visit: Payer: Self-pay

## 2019-05-13 ENCOUNTER — Ambulatory Visit
Admission: RE | Admit: 2019-05-13 | Discharge: 2019-05-13 | Disposition: A | Discharge status: Home / Self Care | Payer: Medicare HMO | Source: Ambulatory Visit | Attending: Radiation Oncology | Admitting: Radiation Oncology

## 2019-05-13 DIAGNOSIS — Z87891 Personal history of nicotine dependence: Secondary | ICD-10-CM | POA: Diagnosis not present

## 2019-05-13 DIAGNOSIS — C3412 Malignant neoplasm of upper lobe, left bronchus or lung: Secondary | ICD-10-CM | POA: Diagnosis not present

## 2019-05-13 DIAGNOSIS — Z510 Encounter for antineoplastic radiation therapy: Secondary | ICD-10-CM | POA: Diagnosis not present

## 2019-05-14 ENCOUNTER — Other Ambulatory Visit: Payer: Self-pay

## 2019-05-14 ENCOUNTER — Ambulatory Visit
Admission: RE | Admit: 2019-05-14 | Discharge: 2019-05-14 | Disposition: A | Discharge status: Home / Self Care | Payer: Medicare HMO | Source: Ambulatory Visit | Attending: Radiation Oncology | Admitting: Radiation Oncology

## 2019-05-14 DIAGNOSIS — Z510 Encounter for antineoplastic radiation therapy: Secondary | ICD-10-CM | POA: Diagnosis not present

## 2019-05-14 DIAGNOSIS — Z87891 Personal history of nicotine dependence: Secondary | ICD-10-CM | POA: Diagnosis not present

## 2019-05-14 DIAGNOSIS — C3412 Malignant neoplasm of upper lobe, left bronchus or lung: Secondary | ICD-10-CM | POA: Diagnosis not present

## 2019-05-15 ENCOUNTER — Ambulatory Visit
Admission: RE | Admit: 2019-05-15 | Discharge: 2019-05-15 | Disposition: A | Discharge status: Home / Self Care | Payer: Medicare HMO | Source: Ambulatory Visit | Attending: Radiation Oncology | Admitting: Radiation Oncology

## 2019-05-15 ENCOUNTER — Other Ambulatory Visit: Payer: Self-pay

## 2019-05-15 DIAGNOSIS — Z510 Encounter for antineoplastic radiation therapy: Secondary | ICD-10-CM | POA: Diagnosis not present

## 2019-05-15 DIAGNOSIS — C3412 Malignant neoplasm of upper lobe, left bronchus or lung: Secondary | ICD-10-CM | POA: Diagnosis not present

## 2019-05-15 DIAGNOSIS — Z87891 Personal history of nicotine dependence: Secondary | ICD-10-CM | POA: Diagnosis not present

## 2019-05-18 ENCOUNTER — Other Ambulatory Visit: Payer: Self-pay

## 2019-05-18 ENCOUNTER — Inpatient Hospital Stay: Payer: Medicare HMO | Attending: Oncology

## 2019-05-18 ENCOUNTER — Other Ambulatory Visit: Payer: Self-pay | Admitting: Oncology

## 2019-05-18 ENCOUNTER — Encounter: Payer: Medicare HMO | Admitting: Oncology

## 2019-05-18 ENCOUNTER — Telehealth: Payer: Self-pay | Admitting: Oncology

## 2019-05-18 ENCOUNTER — Ambulatory Visit
Admission: RE | Admit: 2019-05-18 | Discharge: 2019-05-18 | Disposition: A | Discharge status: Home / Self Care | Payer: Medicare HMO | Source: Ambulatory Visit | Attending: Radiation Oncology | Admitting: Radiation Oncology

## 2019-05-18 DIAGNOSIS — R002 Palpitations: Secondary | ICD-10-CM | POA: Diagnosis present

## 2019-05-18 DIAGNOSIS — C3412 Malignant neoplasm of upper lobe, left bronchus or lung: Secondary | ICD-10-CM | POA: Insufficient documentation

## 2019-05-18 DIAGNOSIS — Z8674 Personal history of sudden cardiac arrest: Secondary | ICD-10-CM | POA: Diagnosis not present

## 2019-05-18 DIAGNOSIS — Z510 Encounter for antineoplastic radiation therapy: Secondary | ICD-10-CM | POA: Insufficient documentation

## 2019-05-18 DIAGNOSIS — Z8249 Family history of ischemic heart disease and other diseases of the circulatory system: Secondary | ICD-10-CM | POA: Diagnosis not present

## 2019-05-18 DIAGNOSIS — Z79899 Other long term (current) drug therapy: Secondary | ICD-10-CM | POA: Insufficient documentation

## 2019-05-18 DIAGNOSIS — Z955 Presence of coronary angioplasty implant and graft: Secondary | ICD-10-CM | POA: Diagnosis not present

## 2019-05-18 DIAGNOSIS — I2510 Atherosclerotic heart disease of native coronary artery without angina pectoris: Secondary | ICD-10-CM | POA: Diagnosis present

## 2019-05-18 DIAGNOSIS — Z87891 Personal history of nicotine dependence: Secondary | ICD-10-CM | POA: Insufficient documentation

## 2019-05-18 DIAGNOSIS — Z79810 Long term (current) use of selective estrogen receptor modulators (SERMs): Secondary | ICD-10-CM | POA: Diagnosis not present

## 2019-05-18 DIAGNOSIS — M1990 Unspecified osteoarthritis, unspecified site: Secondary | ICD-10-CM | POA: Diagnosis present

## 2019-05-18 DIAGNOSIS — I959 Hypotension, unspecified: Secondary | ICD-10-CM | POA: Diagnosis present

## 2019-05-18 DIAGNOSIS — J90 Pleural effusion, not elsewhere classified: Secondary | ICD-10-CM | POA: Diagnosis not present

## 2019-05-18 DIAGNOSIS — Z20828 Contact with and (suspected) exposure to other viral communicable diseases: Secondary | ICD-10-CM | POA: Diagnosis present

## 2019-05-18 DIAGNOSIS — C3490 Malignant neoplasm of unspecified part of unspecified bronchus or lung: Secondary | ICD-10-CM | POA: Diagnosis not present

## 2019-05-18 DIAGNOSIS — E785 Hyperlipidemia, unspecified: Secondary | ICD-10-CM | POA: Diagnosis present

## 2019-05-18 DIAGNOSIS — I4891 Unspecified atrial fibrillation: Secondary | ICD-10-CM | POA: Diagnosis present

## 2019-05-18 DIAGNOSIS — E860 Dehydration: Secondary | ICD-10-CM | POA: Diagnosis present

## 2019-05-18 DIAGNOSIS — H9193 Unspecified hearing loss, bilateral: Secondary | ICD-10-CM | POA: Diagnosis present

## 2019-05-18 DIAGNOSIS — I10 Essential (primary) hypertension: Secondary | ICD-10-CM | POA: Diagnosis present

## 2019-05-18 DIAGNOSIS — Z7982 Long term (current) use of aspirin: Secondary | ICD-10-CM | POA: Insufficient documentation

## 2019-05-18 DIAGNOSIS — E876 Hypokalemia: Secondary | ICD-10-CM | POA: Diagnosis not present

## 2019-05-18 DIAGNOSIS — I252 Old myocardial infarction: Secondary | ICD-10-CM | POA: Diagnosis not present

## 2019-05-18 DIAGNOSIS — R079 Chest pain, unspecified: Secondary | ICD-10-CM | POA: Diagnosis present

## 2019-05-18 DIAGNOSIS — Z888 Allergy status to other drugs, medicaments and biological substances status: Secondary | ICD-10-CM | POA: Diagnosis not present

## 2019-05-18 LAB — CBC WITH DIFFERENTIAL/PLATELET
Abs Immature Granulocytes: 0.06 10*3/uL (ref 0.00–0.07)
Basophils Absolute: 0 10*3/uL (ref 0.0–0.1)
Basophils Relative: 1 %
Eosinophils Absolute: 0.1 10*3/uL (ref 0.0–0.5)
Eosinophils Relative: 1 %
HCT: 40.7 % (ref 39.0–52.0)
Hemoglobin: 13.6 g/dL (ref 13.0–17.0)
Immature Granulocytes: 1 %
Lymphocytes Relative: 16 %
Lymphs Abs: 1.4 10*3/uL (ref 0.7–4.0)
MCH: 28.9 pg (ref 26.0–34.0)
MCHC: 33.4 g/dL (ref 30.0–36.0)
MCV: 86.4 fL (ref 80.0–100.0)
Monocytes Absolute: 1.1 10*3/uL — ABNORMAL HIGH (ref 0.1–1.0)
Monocytes Relative: 14 %
Neutro Abs: 5.7 10*3/uL (ref 1.7–7.7)
Neutrophils Relative %: 67 %
Platelets: 317 10*3/uL (ref 150–400)
RBC: 4.71 MIL/uL (ref 4.22–5.81)
RDW: 12.2 % (ref 11.5–15.5)
WBC: 8.4 10*3/uL (ref 4.0–10.5)
nRBC: 0 % (ref 0.0–0.2)

## 2019-05-18 LAB — COMPREHENSIVE METABOLIC PANEL
ALT: 10 U/L (ref 0–44)
AST: 13 U/L — ABNORMAL LOW (ref 15–41)
Albumin: 3.4 g/dL — ABNORMAL LOW (ref 3.5–5.0)
Alkaline Phosphatase: 84 U/L (ref 38–126)
Anion gap: 10 (ref 5–15)
BUN: 12 mg/dL (ref 8–23)
CO2: 28 mmol/L (ref 22–32)
Calcium: 8.9 mg/dL (ref 8.9–10.3)
Chloride: 95 mmol/L — ABNORMAL LOW (ref 98–111)
Creatinine, Ser: 0.92 mg/dL (ref 0.61–1.24)
GFR calc Af Amer: >60 mL/min (ref >60)
GFR calc non Af Amer: >60 mL/min (ref >60)
Glucose, Bld: 116 mg/dL — ABNORMAL HIGH (ref 70–99)
Potassium: 3.9 mmol/L (ref 3.5–5.1)
Sodium: 133 mmol/L — ABNORMAL LOW (ref 135–145)
Total Bilirubin: 0.7 mg/dL (ref 0.3–1.2)
Total Protein: 6.5 g/dL (ref 6.5–8.1)

## 2019-05-18 LAB — MAGNESIUM: Magnesium: 2.1 mg/dL (ref 1.7–2.4)

## 2019-05-18 NOTE — Telephone Encounter (Signed)
Patient scheduled to be seen in [facility] for dizziness, no appetite and feeling weak. Unfortunately, he had labs drawn and went home. Have called patient to see if I could schedule to see him in the morning. Voicemail box full. Will call back again later.  Labs look ok.   Faythe Casa, NP 05/18/2019 3:39 PM

## 2019-05-18 NOTE — Progress Notes (Signed)
Labs ordered.   Faythe Casa, NP 05/18/2019 2:09 PM

## 2019-05-19 ENCOUNTER — Ambulatory Visit
Admission: RE | Admit: 2019-05-19 | Discharge: 2019-05-19 | Disposition: A | Discharge status: Home / Self Care | Payer: Medicare HMO | Source: Ambulatory Visit | Attending: Radiation Oncology | Admitting: Radiation Oncology

## 2019-05-19 ENCOUNTER — Other Ambulatory Visit: Payer: Self-pay

## 2019-05-19 DIAGNOSIS — Z87891 Personal history of nicotine dependence: Secondary | ICD-10-CM | POA: Diagnosis not present

## 2019-05-19 DIAGNOSIS — C3412 Malignant neoplasm of upper lobe, left bronchus or lung: Secondary | ICD-10-CM | POA: Diagnosis not present

## 2019-05-20 ENCOUNTER — Other Ambulatory Visit: Payer: Self-pay

## 2019-05-20 ENCOUNTER — Emergency Department: Payer: Medicare HMO

## 2019-05-20 ENCOUNTER — Inpatient Hospital Stay: Payer: Medicare HMO

## 2019-05-20 ENCOUNTER — Inpatient Hospital Stay
Admission: EM | Admit: 2019-05-20 | Discharge: 2019-05-21 | DRG: 309 | Disposition: A | Discharge status: Home / Self Care | Payer: Medicare HMO | Attending: Internal Medicine | Admitting: Internal Medicine

## 2019-05-20 ENCOUNTER — Telehealth: Payer: Self-pay | Admitting: *Deleted

## 2019-05-20 ENCOUNTER — Inpatient Hospital Stay (HOSPITAL_BASED_OUTPATIENT_CLINIC_OR_DEPARTMENT_OTHER): Payer: Medicare HMO | Admitting: Oncology

## 2019-05-20 ENCOUNTER — Ambulatory Visit: Payer: Medicare HMO

## 2019-05-20 ENCOUNTER — Encounter: Payer: Self-pay | Admitting: Emergency Medicine

## 2019-05-20 DIAGNOSIS — Z20828 Contact with and (suspected) exposure to other viral communicable diseases: Secondary | ICD-10-CM | POA: Diagnosis present

## 2019-05-20 DIAGNOSIS — Z79899 Other long term (current) drug therapy: Secondary | ICD-10-CM | POA: Diagnosis not present

## 2019-05-20 DIAGNOSIS — I4891 Unspecified atrial fibrillation: Secondary | ICD-10-CM | POA: Diagnosis present

## 2019-05-20 DIAGNOSIS — Z7982 Long term (current) use of aspirin: Secondary | ICD-10-CM

## 2019-05-20 DIAGNOSIS — I959 Hypotension, unspecified: Secondary | ICD-10-CM | POA: Diagnosis present

## 2019-05-20 DIAGNOSIS — R079 Chest pain, unspecified: Secondary | ICD-10-CM | POA: Diagnosis present

## 2019-05-20 DIAGNOSIS — Z955 Presence of coronary angioplasty implant and graft: Secondary | ICD-10-CM

## 2019-05-20 DIAGNOSIS — I252 Old myocardial infarction: Secondary | ICD-10-CM | POA: Diagnosis not present

## 2019-05-20 DIAGNOSIS — H9193 Unspecified hearing loss, bilateral: Secondary | ICD-10-CM | POA: Diagnosis present

## 2019-05-20 DIAGNOSIS — R000 Tachycardia, unspecified: Secondary | ICD-10-CM

## 2019-05-20 DIAGNOSIS — E876 Hypokalemia: Secondary | ICD-10-CM | POA: Diagnosis not present

## 2019-05-20 DIAGNOSIS — M1990 Unspecified osteoarthritis, unspecified site: Secondary | ICD-10-CM | POA: Diagnosis present

## 2019-05-20 DIAGNOSIS — I2510 Atherosclerotic heart disease of native coronary artery without angina pectoris: Secondary | ICD-10-CM | POA: Diagnosis present

## 2019-05-20 DIAGNOSIS — C3412 Malignant neoplasm of upper lobe, left bronchus or lung: Secondary | ICD-10-CM

## 2019-05-20 DIAGNOSIS — E785 Hyperlipidemia, unspecified: Secondary | ICD-10-CM | POA: Diagnosis present

## 2019-05-20 DIAGNOSIS — E860 Dehydration: Secondary | ICD-10-CM | POA: Diagnosis present

## 2019-05-20 DIAGNOSIS — Z87891 Personal history of nicotine dependence: Secondary | ICD-10-CM

## 2019-05-20 DIAGNOSIS — Z888 Allergy status to other drugs, medicaments and biological substances status: Secondary | ICD-10-CM | POA: Diagnosis not present

## 2019-05-20 DIAGNOSIS — I10 Essential (primary) hypertension: Secondary | ICD-10-CM | POA: Diagnosis present

## 2019-05-20 DIAGNOSIS — Z8249 Family history of ischemic heart disease and other diseases of the circulatory system: Secondary | ICD-10-CM | POA: Diagnosis not present

## 2019-05-20 DIAGNOSIS — R002 Palpitations: Secondary | ICD-10-CM | POA: Diagnosis present

## 2019-05-20 DIAGNOSIS — Z8674 Personal history of sudden cardiac arrest: Secondary | ICD-10-CM

## 2019-05-20 DIAGNOSIS — I480 Paroxysmal atrial fibrillation: Secondary | ICD-10-CM | POA: Diagnosis present

## 2019-05-20 LAB — COMPREHENSIVE METABOLIC PANEL
ALT: 13 U/L (ref 0–44)
AST: 18 U/L (ref 15–41)
Albumin: 3.7 g/dL (ref 3.5–5.0)
Alkaline Phosphatase: 91 U/L (ref 38–126)
Anion gap: 14 (ref 5–15)
BUN: 13 mg/dL (ref 8–23)
CO2: 24 mmol/L (ref 22–32)
Calcium: 8.8 mg/dL — ABNORMAL LOW (ref 8.9–10.3)
Chloride: 92 mmol/L — ABNORMAL LOW (ref 98–111)
Creatinine, Ser: 1.22 mg/dL (ref 0.61–1.24)
GFR calc Af Amer: >60 mL/min (ref >60)
GFR calc non Af Amer: 55 mL/min — ABNORMAL LOW (ref >60)
Glucose, Bld: 179 mg/dL — ABNORMAL HIGH (ref 70–99)
Potassium: 3.8 mmol/L (ref 3.5–5.1)
Sodium: 130 mmol/L — ABNORMAL LOW (ref 135–145)
Total Bilirubin: 1.4 mg/dL — ABNORMAL HIGH (ref 0.3–1.2)
Total Protein: 7.2 g/dL (ref 6.5–8.1)

## 2019-05-20 LAB — MRSA PCR SCREENING: MRSA by PCR: NEGATIVE

## 2019-05-20 LAB — APTT: aPTT: 33 seconds (ref 24–36)

## 2019-05-20 LAB — CBC WITH DIFFERENTIAL/PLATELET
Abs Immature Granulocytes: 0.13 10*3/uL — ABNORMAL HIGH (ref 0.00–0.07)
Basophils Absolute: 0.1 10*3/uL (ref 0.0–0.1)
Basophils Relative: 1 %
Eosinophils Absolute: 0.1 10*3/uL (ref 0.0–0.5)
Eosinophils Relative: 1 %
HCT: 43 % (ref 39.0–52.0)
Hemoglobin: 14.3 g/dL (ref 13.0–17.0)
Immature Granulocytes: 1 %
Lymphocytes Relative: 10 %
Lymphs Abs: 1.2 10*3/uL (ref 0.7–4.0)
MCH: 29.2 pg (ref 26.0–34.0)
MCHC: 33.3 g/dL (ref 30.0–36.0)
MCV: 87.8 fL (ref 80.0–100.0)
Monocytes Absolute: 1.2 10*3/uL — ABNORMAL HIGH (ref 0.1–1.0)
Monocytes Relative: 10 %
Neutro Abs: 10.2 10*3/uL — ABNORMAL HIGH (ref 1.7–7.7)
Neutrophils Relative %: 77 %
Platelets: 345 10*3/uL (ref 150–400)
RBC: 4.9 MIL/uL (ref 4.22–5.81)
RDW: 12.4 % (ref 11.5–15.5)
WBC: 13 10*3/uL — ABNORMAL HIGH (ref 4.0–10.5)
nRBC: 0 % (ref 0.0–0.2)

## 2019-05-20 LAB — PROTIME-INR
INR: 1.1 (ref 0.8–1.2)
Prothrombin Time: 14.3 seconds (ref 11.4–15.2)

## 2019-05-20 LAB — TROPONIN I (HIGH SENSITIVITY)
Troponin I (High Sensitivity): 10 ng/L (ref <18)
Troponin I (High Sensitivity): 63 ng/L — ABNORMAL HIGH (ref <18)

## 2019-05-20 LAB — SARS CORONAVIRUS 2 BY RT PCR (HOSPITAL ORDER, PERFORMED IN ~~LOC~~ HOSPITAL LAB): SARS Coronavirus 2: NEGATIVE

## 2019-05-20 LAB — TSH: TSH: 0.975 u[IU]/mL (ref 0.350–4.500)

## 2019-05-20 LAB — HEPARIN LEVEL (UNFRACTIONATED): Heparin Unfractionated: <0.1 IU/mL — ABNORMAL LOW (ref 0.30–0.70)

## 2019-05-20 LAB — BRAIN NATRIURETIC PEPTIDE: B Natriuretic Peptide: 82 pg/mL (ref 0.0–100.0)

## 2019-05-20 LAB — MAGNESIUM: Magnesium: 1.8 mg/dL (ref 1.7–2.4)

## 2019-05-20 MED ORDER — HEPARIN BOLUS VIA INFUSION
2000.0000 [IU] | Freq: Once | INTRAVENOUS | Status: AC
  Administered 2019-05-21: 2000 [IU] via INTRAVENOUS
  Filled 2019-05-20: qty 2000

## 2019-05-20 MED ORDER — DILTIAZEM HCL 25 MG/5ML IV SOLN
INTRAVENOUS | Status: AC
  Filled 2019-05-20: qty 5

## 2019-05-20 MED ORDER — AMIODARONE LOAD VIA INFUSION
150.0000 mg | Freq: Once | INTRAVENOUS | Status: AC
  Administered 2019-05-20: 13:00:00 150 mg via INTRAVENOUS
  Filled 2019-05-20: qty 83.34

## 2019-05-20 MED ORDER — DILTIAZEM HCL 100 MG IV SOLR: 5.0000 mg/h | INTRAVENOUS | Status: DC

## 2019-05-20 MED ORDER — DILTIAZEM HCL 100 MG IV SOLR
INTRAVENOUS | Status: AC
  Administered 2019-05-20: 10 mg/h via INTRAVENOUS
  Filled 2019-05-20: qty 100

## 2019-05-20 MED ORDER — DILTIAZEM LOAD VIA INFUSION
5.0000 mg | Freq: Once | INTRAVENOUS | Status: AC
  Administered 2019-05-20: 5 mg via INTRAVENOUS
  Filled 2019-05-20: qty 5

## 2019-05-20 MED ORDER — DILTIAZEM HCL 100 MG IV SOLR
INTRAVENOUS | Status: AC
  Filled 2019-05-20: qty 100

## 2019-05-20 MED ORDER — MIDAZOLAM HCL 2 MG/2ML IJ SOLN
INTRAMUSCULAR | Status: AC
  Administered 2019-05-20: 2 mg via INTRAVENOUS
  Filled 2019-05-20: qty 2

## 2019-05-20 MED ORDER — HEPARIN SODIUM (PORCINE) 5000 UNIT/ML IJ SOLN
4000.0000 [IU] | Freq: Once | INTRAMUSCULAR | Status: DC
  Administered 2019-05-20: 13:00:00 4000 [IU] via INTRAVENOUS

## 2019-05-20 MED ORDER — HEPARIN (PORCINE) 25000 UT/250ML-% IV SOLN
1050.0000 [IU]/h | INTRAVENOUS | Status: DC
  Administered 2019-05-20: 800 [IU]/h via INTRAVENOUS

## 2019-05-20 MED ORDER — HEPARIN (PORCINE) 25000 UT/250ML-% IV SOLN: 780.0000 [IU]/h | INTRAVENOUS | Status: DC

## 2019-05-20 MED ORDER — FENTANYL CITRATE (PF) 100 MCG/2ML IJ SOLN
50.0000 ug | Freq: Once | INTRAMUSCULAR | Status: AC
  Administered 2019-05-20: 50 ug via INTRAVENOUS
  Filled 2019-05-20: qty 2

## 2019-05-20 MED ORDER — DILTIAZEM HCL 100 MG IV SOLR
5.0000 mg/h | INTRAVENOUS | Status: DC
  Administered 2019-05-20: 12:00:00 10 mg/h via INTRAVENOUS

## 2019-05-20 MED ORDER — MIDAZOLAM HCL 2 MG/2ML IJ SOLN
2.0000 mg | Freq: Once | INTRAMUSCULAR | Status: AC
  Administered 2019-05-20: 13:00:00 2 mg via INTRAVENOUS

## 2019-05-20 MED ORDER — AMIODARONE HCL IN DEXTROSE 360-4.14 MG/200ML-% IV SOLN
60.0000 mg/h | INTRAVENOUS | Status: DC
  Administered 2019-05-20: 13:00:00 60 mg/h via INTRAVENOUS
  Filled 2019-05-20: qty 200

## 2019-05-20 MED ORDER — HEPARIN SODIUM (PORCINE) 5000 UNIT/ML IJ SOLN
INTRAMUSCULAR | Status: AC
  Administered 2019-05-20: 4000 [IU] via INTRAVENOUS
  Filled 2019-05-20: qty 1

## 2019-05-20 MED ORDER — AMIODARONE IV BOLUS ONLY 150 MG/100ML
INTRAVENOUS | Status: AC
  Administered 2019-05-20: 150 mg via INTRAVENOUS
  Filled 2019-05-20: qty 100

## 2019-05-20 MED ORDER — ONDANSETRON HCL 4 MG/2ML IJ SOLN: 4.0000 mg | Freq: Four times a day (QID) | INTRAMUSCULAR | Status: DC | PRN

## 2019-05-20 MED ORDER — HEPARIN BOLUS VIA INFUSION
4000.0000 [IU] | Freq: Once | INTRAVENOUS | Status: DC
  Filled 2019-05-20: qty 4000

## 2019-05-20 MED ORDER — ACETAMINOPHEN 325 MG PO TABS: 650.0000 mg | ORAL_TABLET | ORAL | Status: DC | PRN

## 2019-05-20 MED ORDER — HEPARIN (PORCINE) 25000 UT/250ML-% IV SOLN
INTRAVENOUS | Status: AC
  Filled 2019-05-20: qty 250

## 2019-05-20 MED ORDER — ASPIRIN EC 81 MG PO TBEC
81.0000 mg | DELAYED_RELEASE_TABLET | Freq: Every day | ORAL | Status: DC
  Administered 2019-05-21: 11:00:00 81 mg via ORAL
  Filled 2019-05-20: qty 1

## 2019-05-20 MED ORDER — AMIODARONE IV BOLUS ONLY 150 MG/100ML
150.0000 mg | Freq: Once | INTRAVENOUS | Status: AC
  Administered 2019-05-20: 13:00:00 150 mg via INTRAVENOUS

## 2019-05-20 MED ORDER — AMIODARONE HCL IN DEXTROSE 360-4.14 MG/200ML-% IV SOLN
30.0000 mg/h | INTRAVENOUS | Status: DC
  Administered 2019-05-20 – 2019-05-21 (×2): 30 mg/h via INTRAVENOUS
  Filled 2019-05-20 (×2): qty 200

## 2019-05-20 MED ORDER — SODIUM CHLORIDE 0.9 % IV SOLN
INTRAVENOUS | Status: DC
  Administered 2019-05-20 (×2): via INTRAVENOUS

## 2019-05-20 MED ORDER — LACTATED RINGERS IV BOLUS: 500.0000 mL | Freq: Once | INTRAVENOUS | Status: DC

## 2019-05-20 MED ORDER — DILTIAZEM HCL 100 MG IV SOLR
5.0000 mg/h | INTRAVENOUS | Status: DC
  Administered 2019-05-20: 5 mg/h via INTRAVENOUS

## 2019-05-20 MED ORDER — DILTIAZEM LOAD VIA INFUSION
10.0000 mg | Freq: Once | INTRAVENOUS | Status: AC
  Administered 2019-05-20: 12:00:00 10 mg via INTRAVENOUS
  Filled 2019-05-20: qty 10

## 2019-05-20 MED FILL — Medication: Qty: 1 | Status: AC

## 2019-05-20 NOTE — Consult Note (Signed)
  Amiodarone Drug - Drug Interaction Consult Note  Recommendations: No recommendations. No major DDI noted with current medications. No DDI noted with patient's current med rec.   Amiodarone is metabolized by the cytochrome P450 system and therefore has the potential to cause many drug interactions. Amiodarone has an average plasma half-life of 50 days (range 20 to 100 days).   There is potential for drug interactions to occur several weeks or months after stopping treatment and the onset of drug interactions may be slow after initiating amiodarone.   []  Statins: Increased risk of myopathy. Simvastatin- restrict dose to 20mg  daily. Other statins: counsel patients to report any muscle pain or weakness immediately.  []  Anticoagulants: Amiodarone can increase anticoagulant effect. Consider warfarin dose reduction. Patients should be monitored closely and the dose of anticoagulant altered accordingly, remembering that amiodarone levels take several weeks to stabilize.  []  Antiepileptics: Amiodarone can increase plasma concentration of phenytoin, the dose should be reduced. Note that small changes in phenytoin dose can result in large changes in levels. Monitor patient and counsel on signs of toxicity.  [x]  Beta blockers: increased risk of bradycardia, AV block and myocardial depression. Sotalol - avoid concomitant use. Pt takes labetalol prior to admission. Recommend monitoring HR.   [x]   Calcium channel blockers (diltiazem and verapamil): increased risk of bradycardia, AV block and myocardial depression.  []   Cyclosporine: Amiodarone increases levels of cyclosporine. Reduced dose of cyclosporine is recommended.  []  Digoxin dose should be halved when amiodarone is started.  []  Diuretics: increased risk of cardiotoxicity if hypokalemia occurs.  []  Oral hypoglycemic agents (glyburide, glipizide, glimepiride): increased risk of hypoglycemia. Patient's glucose levels should be monitored closely  when initiating amiodarone therapy.   []  Drugs that prolong the QT interval:  Torsades de pointes risk may be increased with concurrent use - avoid if possible.  Monitor QTc, also keep magnesium/potassium WNL if concurrent therapy can't be avoided. Marland Kitchen Antibiotics: e.g. fluoroquinolones, erythromycin. . Antiarrhythmics: e.g. quinidine, procainamide, disopyramide, sotalol. . Antipsychotics: e.g. phenothiazines, haloperidol.  . Lithium, tricyclic antidepressants, and methadone. Thank You,  Oswald Hillock  05/20/2019 12:28 PM

## 2019-05-20 NOTE — ED Triage Notes (Signed)
Pt came from cancer center with c/o a-fib. Pt is being treated for lung cancer. Pt A&Ox4

## 2019-05-20 NOTE — Consult Note (Signed)
Cardiology Consultation Note    Patient ID: Scott Gross, MRN: [id number], DOB/AGE: 06-28-1934 85 y.o. Admit date: 05/20/2019   Date of Consult: 05/20/2019 Primary Physician: [name], MD Primary Cardiologist: Dr. Clayborn Bigness  Chief Complaint: afib Reason for Consultation: afib Requesting MD: Dr. Estanislado Pandy  HPI: Scott Gross is a 85 y.o. male with history of coronary artery disease status post inferior ST elevation myocardial infarction in 5956 complicated by VF arrest with placement of a drug-eluting stent in the mid LAD.  Had cardiac catheterization done recently showing patent stent in the LAD and moderate disease in the RCA and left circumflex.  He also has a history of cancer of the left upper lobe of the lung status post brachytherapy.  Has a history of hypertension hyperlipidemia.  He was noted to be weak with some nausea.  Presented to the emergency room where he was noted to have atrial fibrillation.  He was given IV amiodarone loading dose and started on infusion.  Attempts at cardioversion was unsuccessful.  He was started on IV Cardizem drip.  He currently is in sinus rhythm.  High-sensitivity troponin is 63 thus far secondary to tachycardia.  BNP was 82.  Magnesium was 1.8.  TSH is 0.975.  Potassium was 3.8.  Renal function showed mild volume depletion of 1.22 creatinine up from baseline of 0.8.  Chest x-ray showed prominent left upper lobe mass which was not new.  No pulmonary edema.  Past Medical History:  Diagnosis Date  . Arthritis   . CAD (coronary artery disease) Nov. 12, 2012   Inferior ST elevation MI in November of 2012 with ventricular fibrillation arrest. Status post drug-eluting stent placement to the mid LAD. Most recent cardiac catheterization in June of this year showed patent stent with minimal restenosis, 75% proximal LAD and 80% distal LAD which were unchanged from before. Mild disease in the left circumflex and moderate RCA disease. Ejection fraction was 55%.   . Cancer of upper lobe of left lung ([place]) 11/2016   Rad tx's.  . Cardiac arrest [facility]) Nov. 2012   x 2   . Cardiac arrest - ventricular fibrillation   . HOH (hard of hearing)    Left Hearing Aid  . Hyperlipidemia   . Hypertension   . Post PTCA 2013   x2  . ST elevation MI (STEMI) ([place]) 08/26/2011      Surgical History:  Past Surgical History:  Procedure Laterality Date  . CARDIAC CATHETERIZATION  2013   @ [place]; [facility] s/p stent   . CORONARY ANGIOPLASTY    . ELECTROMAGNETIC NAVIGATION BROCHOSCOPY N/A 11/27/2016   Procedure: ELECTROMAGNETIC NAVIGATION BRONCHOSCOPY;  Surgeon: [name], MD;  Location: [facility] ORS;  Service: Cardiopulmonary;  Laterality: N/A;     Home Meds: Prior to Admission medications   Medication Sig Start Date End Date Taking? Authorizing Provider  aspirin 81 MG tablet Take 81 mg by mouth daily. Patient stopped on November 22, 2016 for procedure   Yes [provider]  furosemide (LASIX) 20 MG tablet Take 20 mg by mouth daily. As needed for edema 05/14/19 05/13/20 Yes [provider]  labetalol (NORMODYNE) 100 MG tablet Take 100 mg by mouth once.  02/24/18  Yes [provider]  lisinopril-hydrochlorothiazide (PRINZIDE,ZESTORETIC) 20-25 MG tablet Take 1 tablet by mouth 2 (two) times daily.   Yes [provider]  amLODipine (NORVASC) 5 MG tablet Take 5 mg by mouth as needed. For systolic pressure greater than 150    [provider]    Inpatient Medications:  . [START ON 05/21/2019] aspirin EC  81 mg Oral Daily  . diltiazem       . sodium chloride 75 mL/hr at 05/20/19 1518  . amiodarone 60 mg/hr (05/20/19 1257)   Followed by  . amiodarone    . diltiazem (CARDIZEM) infusion 5 mg/hr (05/20/19 1339)  . heparin 800 Units/hr (05/20/19 1333)  . lactated ringers      Allergies:  Allergies  Allergen Reactions  . Statins Other (See Comments)    Arthralagia    Social History   Socioeconomic History  . Marital status:  Married    Spouse name: Not on file  . Number of children: Not on file  . Years of education: Not on file  . Highest education level: Not on file  Occupational History  . Not on file  Social Needs  . Financial resource strain: Not on file  . Food insecurity    Worry: Not on file    Inability: Not on file  . Transportation needs    Medical: Not on file    Non-medical: Not on file  Tobacco Use  . Smoking status: Former Smoker    Packs/day: 1.00    Types: Cigarettes    Quit date: 08/27/2011    Years since quitting: 7.7  . Smokeless tobacco: Never Used  Substance and Sexual Activity  . Alcohol use: No  . Drug use: No  . Sexual activity: Not on file  Lifestyle  . Physical activity    Days per week: Not on file    Minutes per session: Not on file  . Stress: Not on file  Relationships  . Social Herbalist on phone: Not on file    Gets together: Not on file    Attends religious service: Not on file    Active member of club or organization: Not on file    Attends meetings of clubs or organizations: Not on file    Relationship status: Not on file  . Intimate partner violence    Fear of current or ex partner: Not on file    Emotionally abused: Not on file    Physically abused: Not on file    Forced sexual activity: Not on file  Other Topics Concern  . Not on file  Social History Narrative  . Not on file     Family History  Problem Relation Age of Onset  . Heart attack Mother   . Heart attack Father      Review of Systems: A 12-system review of systems was performed and is negative except as noted in the HPI.  Labs: No results for input(s): CKTOTAL, CKMB, TROPONINI in the last 72 hours. Lab Results  Component Value Date   WBC 13.0 (H) 05/20/2019   HGB 14.3 05/20/2019   HCT 43.0 05/20/2019   MCV 87.8 05/20/2019   PLT 345 05/20/2019    Recent Labs  Lab 05/20/19 1154  NA 130*  K 3.8  CL 92*  CO2 24  BUN 13  CREATININE 1.22  CALCIUM 8.8*  PROT  7.2  BILITOT 1.4*  ALKPHOS 91  ALT 13  AST 18  GLUCOSE 179*   Lab Results  Component Value Date   CHOL 92 11/28/2013   HDL 27 (L) 11/28/2013   LDLCALC 41 11/28/2013   TRIG 121 11/28/2013   No results found for: DDIMER  Radiology/Studies:  Ct Chest W Contrast  Result Date: 04/21/2019 CLINICAL DATA:  Left lung cancer, diagnosed 2018 EXAM: CT CHEST WITH CONTRAST TECHNIQUE: Multidetector CT imaging of the chest was performed during intravenous contrast administration. CONTRAST:  42mL OMNIPAQUE IOHEXOL 300 MG/ML  SOLN COMPARISON:  PET-CT dated 01/22/2019 FINDINGS: Cardiovascular: Heart is normal in size.  No pericardial effusion. No evidence thoracic aortic aneurysm. Mild atherosclerotic calcifications of the aortic arch. Coronary atherosclerosis of the LAD and right coronary artery. Mediastinum/Nodes: 8 mm short axis high right paratracheal node (series 2/image 83), unchanged. Additional small mediastinal lymph nodes. 8 mm short axis right hilar node (series 2/image 37), unchanged. Visualized thyroid is unremarkable. Lungs/Pleura: Progressive 5.4 x 6.4 cm mixed solid/cavitary mass in the medial left upper lobe (series 3/image 44). Previous multicentric nature is now coalescent around a dominant central 4.0 x 2.9 cm solid component (series 2/image 44) which is largely new. Anterolateral curvilinear solid component adjacent to the fiducial markers are unchanged (series 3/image 37), possibly related to the patient's known radiation changes. Inferolateral subsolid component is similar but favored to be mildly progressive with involvement posteriorly of the fissure (series 3/image 50). Mild centrilobular emphysematous changes, upper lung predominant. Trace pleural fluid/studding along the posterior right hemithorax (series 2/image 100), unchanged. No pneumothorax. Upper Abdomen: Visualized upper abdomen is grossly unremarkable. Musculoskeletal: Mild degenerative changes of the visualized thoracolumbar  spine. IMPRESSION: Progressive 6.4 cm mixed solid/cavitary mass in the medial left upper lobe, now with dominant 4.0 cm central solid component, as above. This remains compatible with recurrent tumor. Small mediastinal/right hilar nodes, indeterminate. Aortic Atherosclerosis (ICD10-I70.0) and Emphysema (ICD10-J43.9). Electronically Signed   By: Julian Hy M.D.   On: 04/21/2019 10:55   Mr Jeri Cos AO Contrast  Result Date: 05/08/2019 CLINICAL DATA:  Lung cancer with possible seizure history EXAM: MRI HEAD WITHOUT AND WITH CONTRAST TECHNIQUE: Multiplanar, multiecho pulse sequences of the brain and surrounding structures were obtained without and with intravenous contrast. CONTRAST:  6 cc Gadavist intravenous COMPARISON:  None. FINDINGS: Brain: No mass or swelling to suggest metastatic disease. Generalized atrophy and mild for age microvascular ischemic change. No infarct, hemorrhage, hydrocephalus, or mass effect. Mild CSF accumulation behind the cerebellum. Vascular: Major flow voids and vascular enhancements are preserved Skull and upper cervical spine: And negative for marrow lesion Sinuses/Orbits: Negative IMPRESSION: Negative for metastatic disease or acute finding. Electronically Signed   By: Monte Fantasia M.D.   On: 05/08/2019 10:20   Dg Chest Portable 1 View  Result Date: 05/20/2019 CLINICAL DATA:  Atrial fibrillation.  Low blood pressure. EXAM: PORTABLE CHEST 1 VIEW COMPARISON:  CT 04/21/2019.  Chest x-ray 02/10/2018. FINDINGS: Patient is rotated to the right. Upper mediastinal evaluation is difficult due to patient rotation. Prominent left upper lobe mass again noted. Radiation seed clips are again noted within the mass. Similar findings noted on prior CT. No focal alveolar infiltrate. No pleural effusion or pneumothorax. Heart size normal. IMPRESSION: Prominent left upper lobe mass again noted. Radiation seed clips again noted within the mass. Similar appearance noted on prior CT of  04/21/2019. Electronically Signed   By: Marcello Moores  Register   On: 05/20/2019 12:28    Wt Readings from Last 3 Encounters:  05/20/19 65.3 kg  04/27/19 64.8 kg  10/02/18 65.6 kg    EKG: Atrial fibrillation with rapid ventricular response no ischemia  Physical Exam: Caucasian man in no acute distress Blood pressure 102/72, pulse 81, temperature 98.3 F (36.8 C), temperature source Oral, resp. rate 16, height 5\' 7"  (1.702 m), weight 65.3 kg, SpO2 100 %. Body mass index  is 22.55 kg/m. General: Well developed, well nourished, in no acute distress. Head: Normocephalic, atraumatic, sclera non-icteric, no xanthomas, nares are without discharge.  Neck: Negative for carotid bruits. JVD not elevated. Lungs: Clear bilaterally to auscultation without wheezes, rales, or rhonchi. Breathing is unlabored. Heart: RRR with S1 S2. No murmurs, rubs, or gallops appreciated. Abdomen: Soft, non-tender, non-distended with normoactive bowel sounds. No hepatomegaly. No rebound/guarding. No obvious abdominal masses. Msk:  Strength and tone appear normal for age. Extremities: No clubbing or cyanosis. No edema.  Distal pedal pulses are 2+ and equal bilaterally. Neuro: Alert and oriented X 3. No facial asymmetry. No focal deficit. Moves all extremities spontaneously. Psych:  Responds to questions appropriately with a normal affect.     Assessment and Plan  Patient with history of coronary disease status post PCI, history of lung carcinoma with radiation therapy now with A. fib with rapid ventricular response.  Failed cardioversion initially.  Currently is in sinus rhythm on IV amiodarone IV Cardizem.  Hemodynamically stable.  Asymptomatic at present.  Appears to have ruled out for myocardial infarction.  No evidence of congestive heart failure.  Electrolytes were normal.  We will continue with amiodarone load IV for now and wean IV Cardizem as tolerated although we will continue with this at present.  We will also treat  with heparin and decide on chronic anticoagulation prior to discharge.  Signed, Teodoro Spray MD 05/20/2019, 4:52 PM Pager: [phone]

## 2019-05-20 NOTE — Progress Notes (Signed)
Advanced care plan. Purpose of the Encounter: CODE STATUS Parties in Attendance: Patient Patient's Decision Capacity: Good Subjective/Patient's story:  Trevian Hayashida  is a 85 y.o. male with a known history of lung cancer, coronary disease, cardiac stent, hyperlipidemia, hypertension currently on radiation therapy.  Patient feeling weak and has some nausea.  Was evaluated in the emergency room was found to be having atrial fibrillation.  He was given IV amiodarone loading dose and started on infusion.  Cardioversion was tried but patient failed.  Patient started on IV diltiazem drip for rate control.   Objective/Medical story Patient needs IV amiodarone drip and IV Cardizem drip for rate control Failed cardioversion Needs cardiology consultation and management Goals of care determination:  Advance care directives goals of care treatment plan discussed For now patient and family want everything done which includes CPR, intubation ventilator if the need arises CODE STATUS: Full code Time spent discussing advanced care planning: 16 minutes

## 2019-05-20 NOTE — Consult Note (Signed)
ANTICOAGULATION CONSULT NOTE - follow up  Pharmacy Consult for Heparin Dosing Indication: atrial fibrillation  Allergies  Allergen Reactions  . Statins Other (See Comments)    Arthralagia   Patient Measurements: Height: 5\' 8"  (172.7 cm) Weight: 144 lb 6.4 oz (65.5 kg) IBW/kg (Calculated) : 68.4 Heparin Dosing Weight: 65.3 kg  Vital Signs: Temp: 98.4 F (36.9 C) (08/05 2004) Temp Source: Oral (08/05 2004) BP: 119/75 (08/05 2300) Pulse Rate: 72 (08/05 2300)  Labs: Recent Labs    05/18/19 1413 05/20/19 1154 05/20/19 1516 05/20/19 2206  HGB 13.6 14.3  --   --   HCT 40.7 43.0  --   --   PLT 317 345  --   --   APTT  --  33  --   --   LABPROT  --  14.3  --   --   INR  --  1.1  --   --   HEPARINUNFRC  --   --   --  <0.10*  CREATININE 0.92 1.22  --   --   TROPONINIHS  --  10 63*  --     Estimated Creatinine Clearance: 43.2 mL/min (by C-G formula based on SCr of 1.22 mg/dL).   Medical History: Past Medical History:  Diagnosis Date  . Arthritis   . CAD (coronary artery disease) Nov. 12, 2012   Inferior ST elevation MI in November of 2012 with ventricular fibrillation arrest. Status post drug-eluting stent placement to the mid LAD. Most recent cardiac catheterization in June of this year showed patent stent with minimal restenosis, 75% proximal LAD and 80% distal LAD which were unchanged from before. Mild disease in the left circumflex and moderate RCA disease. Ejection fraction was 55%.  . Cancer of upper lobe of left lung ([place]) 11/2016   Rad tx's.  . Cardiac arrest [facility]) Nov. 2012   x 2   . Cardiac arrest - ventricular fibrillation   . HOH (hard of hearing)    Left Hearing Aid  . Hyperlipidemia   . Hypertension   . Post PTCA 2013   x2  . ST elevation MI (STEMI) ([place]) 08/26/2011    Medications:  Medications Prior to Admission  Medication Sig Dispense Refill Last Dose  . aspirin 81 MG tablet Take 81 mg by mouth daily. Patient stopped on November 22, 2016 for  procedure   05/20/2019 at 0700  . furosemide (LASIX) 20 MG tablet Take 20 mg by mouth daily. As needed for edema   05/19/2019 at 0700  . labetalol (NORMODYNE) 100 MG tablet Take 100 mg by mouth once.    05/20/2019 at 0700  . lisinopril-hydrochlorothiazide (PRINZIDE,ZESTORETIC) 20-25 MG tablet Take 1 tablet by mouth 2 (two) times daily.   05/20/2019 at 0700  . amLODipine (NORVASC) 5 MG tablet Take 5 mg by mouth as needed. For systolic pressure greater than 150   prn at prn   Scheduled:  . [START ON 05/21/2019] aspirin EC  81 mg Oral Daily  . diltiazem      . [START ON 05/21/2019] heparin  2,000 Units Intravenous Once   Infusions:  . sodium chloride 75 mL/hr at 05/20/19 2200  . amiodarone 30 mg/hr (05/20/19 2200)  . diltiazem (CARDIZEM) infusion 2 mg/hr (05/20/19 2200)  . heparin 800 Units/hr (05/20/19 2200)  . lactated ringers     PRN:  Anti-infectives (From admission, onward)   None     Assessment: Pharmacy has been consulted to initiate Heparin Dosing on 85yo patient complaining of chest  pain. Patient has no prior history of anticoagulant use. Baseline labs have been ordered and results are pending.  08/05 @ 2206 HL < 0.10  Goal of Therapy:  Heparin level 0.3-0.7 units/ml Monitor platelets by anticoagulation protocol: Yes   Plan:  Rebolus Heparin w/ 2000 units x 1 Increase heparin infusion to 1050 units/hr Check anti-Xa level in 6 hours and daily while on heparin Continue to monitor H&H and platelets  Hart Robinsons A 05/20/2019,11:54 PM

## 2019-05-20 NOTE — Progress Notes (Signed)
Patient evaluated by Beckey Rutter, NP prior to my assessment.  He was sent to the emergency room for possible A. Fib.  Faythe Casa, NP 05/20/2019 12:33 PM

## 2019-05-20 NOTE — Progress Notes (Signed)
Called to exam room by nursing who reported irregular vital signs; bp 80s/50s, hr irregular. Per patient he is very weak, vomiting, and had near syncopal episode this morning, headache. He is very hard of hearing making history difficult. On auscultation heart rate is irregular. Per chart review and patient no history of a fib. Discussed with patient's daughter by phone due to covid-19 visitor precautions that I advise cardiac work-up today to rule out cardiac etiology which could best be performed in ER. Patient followed by Dr. Clayborn Bigness and was seen in mid-July with some changes to his BP medications at that time. Patient's daughter is a Marine scientist and will follow to ER. Doni, RN and Erline Levine, RN placed patient on oxygen via [place] and traveled with patient to ER and provide report to triage.

## 2019-05-20 NOTE — Telephone Encounter (Addendum)
Daughter called. daughter voicing concerns that she had been waiting for triage to return her phone call. She felt that she needed to speak to someone asap regarding her dad's medical care. I was asked to speak to the daughter by the scheduling team.  Family requesting pt to be evaluated today asap for iv fluids. C/o positional orthostatic vitals and Intermittent nausea. No fevers noted. Patient unable to have radiation therapy today per daughter. Very weak. Daughter stated that patient only had labs drawn at the earlier portion of the week and no iv fluids. She does not understand why patient only had labs drawn. Per chart 8/3, patient left clinic w/o having iv fluids and attempts were made to reach patient as 'mailbox if full and unable to reach patient per Rulon Abide, NP."   I personally spoke with Sonia Baller NP, who agrees to have patient come in now for iv fluids. No labs. She will evaluate patient. Daughter aware that no family is able to attend this apt inside the clinic due to visitor restrictions; however, the NP could contact the patient's family while the patient is in the clinic to collaborate for the patient's care. Daughter concerned that patient will not understand any plan of care due to being hearing impaired. Active listening provided.  Apt offered for patient to to come now for iv fluids in [facility]. Daughter will have pt here in the next 15 mins.

## 2019-05-20 NOTE — ED Notes (Addendum)
Pt taken to RM right away, HR 40s-190s. MD Charna Archer made aware and given EKG

## 2019-05-20 NOTE — ED Notes (Addendum)
ED TO INPATIENT HANDOFF REPORT  ED Nurse Name and Phone #: Annelisa Ryback, RN   S Name/Age/Gender Scott Gross 85 y.o. male Room/Bed: ED16A/ED16A  Code Status   Code Status: Not on file  Home/SNF/Other Home Patient oriented to: self, place, time and situation Is this baseline? Yes   Triage Complete: Triage complete  Chief Complaint AFIB  Triage Note Pt came from cancer center with c/o a-fib. Pt is being treated for lung cancer. Pt A&Ox4    Allergies Allergies  Allergen Reactions  . Statins Other (See Comments)    Arthralagia    Level of Care/Admitting Diagnosis ED Disposition    ED Disposition Condition [facility]: [place] [[id number]]  Level of Care: Stepdown [14]  Covid Evaluation: Confirmed COVID Negative  Diagnosis: A-fib [facility]) [[id number]]  Admitting Physician: Saundra Shelling [[id number]]  Attending Physician: Saundra Shelling [[id number]]  Estimated length of stay: past midnight tomorrow  Certification:: I certify this patient will need inpatient services for at least 2 midnights  PT Class (Do Not Modify): Inpatient [101]  PT Acc Code (Do Not Modify): Private [1]       B Medical/Surgery History Past Medical History:  Diagnosis Date  . Arthritis   . CAD (coronary artery disease) Nov. 12, 2012   Inferior ST elevation MI in November of 2012 with ventricular fibrillation arrest. Status post drug-eluting stent placement to the mid LAD. Most recent cardiac catheterization in June of this year showed patent stent with minimal restenosis, 75% proximal LAD and 80% distal LAD which were unchanged from before. Mild disease in the left circumflex and moderate RCA disease. Ejection fraction was 55%.  . Cancer of upper lobe of left lung ([place]) 11/2016   Rad tx's.  . Cardiac arrest [facility]) Nov. 2012   x 2   . Cardiac arrest - ventricular fibrillation   . HOH (hard of hearing)    Left Hearing Aid  . Hyperlipidemia   . Hypertension   . Post  PTCA 2013   x2  . ST elevation MI (STEMI) ([place]) 08/26/2011   Past Surgical History:  Procedure Laterality Date  . CARDIAC CATHETERIZATION  2013   @ [place]; [facility] s/p stent   . CORONARY ANGIOPLASTY    . ELECTROMAGNETIC NAVIGATION BROCHOSCOPY N/A 11/27/2016   Procedure: ELECTROMAGNETIC NAVIGATION BRONCHOSCOPY;  Surgeon: [name], MD;  Location: [facility] ORS;  Service: Cardiopulmonary;  Laterality: N/A;     A IV Location/Drains/Wounds Patient Lines/Drains/Airways Status   Active Line/Drains/Airways    Name:   Placement date:   Placement time:   Site:   Days:   Peripheral IV 05/20/19 Left Antecubital   05/20/19    1152    Antecubital   less than 1          Intake/Output Last 24 hours No intake or output data in the 24 hours ending 05/20/19 1431  Labs/Imaging Results for orders placed or performed during the hospital encounter of 05/20/19 (from the past 48 hour(s))  Comprehensive metabolic panel     Status: Abnormal   Collection Time: 05/20/19 11:54 AM  Result Value Ref Range   Sodium 130 (L) 135 - 145 mmol/L   Potassium 3.8 3.5 - 5.1 mmol/L   Chloride 92 (L) 98 - 111 mmol/L   CO2 24 22 - 32 mmol/L   Glucose, Bld 179 (H) 70 - 99 mg/dL   BUN 13 8 - 23 mg/dL   Creatinine, Ser 1.22 0.61 - 1.24 mg/dL  Calcium 8.8 (L) 8.9 - 10.3 mg/dL   Total Protein 7.2 6.5 - 8.1 g/dL   Albumin 3.7 3.5 - 5.0 g/dL   AST 18 15 - 41 U/L   ALT 13 0 - 44 U/L   Alkaline Phosphatase 91 38 - 126 U/L   Total Bilirubin 1.4 (H) 0.3 - 1.2 mg/dL   GFR calc non Af Amer 55 (L) >60 mL/min   GFR calc Af Amer >60 >60 mL/min   Anion gap 14 5 - 15    Comment: Performed at [facility], [address] [place], [place] [address]  CBC with Differential     Status: Abnormal   Collection Time: 05/20/19 11:54 AM  Result Value Ref Range   WBC 13.0 (H) 4.0 - 10.5 K/uL   RBC 4.90 4.22 - 5.81 MIL/uL   Hemoglobin 14.3 13.0 - 17.0 g/dL   HCT 43.0 39.0 - 52.0 %   MCV 87.8 80.0 - 100.0 fL   MCH 29.2 26.0 -  34.0 pg   MCHC 33.3 30.0 - 36.0 g/dL   RDW 12.4 11.5 - 15.5 %   Platelets 345 150 - 400 K/uL   nRBC 0.0 0.0 - 0.2 %   Neutrophils Relative % 77 %   Neutro Abs 10.2 (H) 1.7 - 7.7 K/uL   Lymphocytes Relative 10 %   Lymphs Abs 1.2 0.7 - 4.0 K/uL   Monocytes Relative 10 %   Monocytes Absolute 1.2 (H) 0.1 - 1.0 K/uL   Eosinophils Relative 1 %   Eosinophils Absolute 0.1 0.0 - 0.5 K/uL   Basophils Relative 1 %   Basophils Absolute 0.1 0.0 - 0.1 K/uL   Immature Granulocytes 1 %   Abs Immature Granulocytes 0.13 (H) 0.00 - 0.07 K/uL    Comment: Performed at [facility], [place], [place] [address]  Troponin I (High Sensitivity)     Status: None   Collection Time: 05/20/19 11:54 AM  Result Value Ref Range   Troponin I (High Sensitivity) 10 <18 ng/L    Comment: (NOTE) Elevated high sensitivity troponin I (hsTnI) values and significant  changes across serial measurements may suggest ACS but many other  chronic and acute conditions are known to elevate hsTnI results.  Refer to the Links section for chest pain algorithms and additional  guidance. Performed at [facility], [address] [place], [place] [address]   TSH     Status: None   Collection Time: 05/20/19 11:54 AM  Result Value Ref Range   TSH 0.975 0.350 - 4.500 uIU/mL    Comment: Performed by a 3rd Generation assay with a functional sensitivity of <=0.01 uIU/mL. Performed at [facility], [address] [place], [place] [address]   Magnesium     Status: None   Collection Time: 05/20/19 11:54 AM  Result Value Ref Range   Magnesium 1.8 1.7 - 2.4 mg/dL    Comment: Performed at [facility], [address] [place], [place] [address]  Brain natriuretic peptide     Status: None   Collection Time: 05/20/19 11:54 AM  Result Value Ref Range   B Natriuretic Peptide 82.0 0.0 - 100.0 pg/mL    Comment: Performed at [facility], [address], [place], [place]  [address]   Dg Chest Portable 1 View  Result Date: 05/20/2019 CLINICAL DATA:  Atrial fibrillation.  Low blood pressure. EXAM: PORTABLE CHEST 1 VIEW COMPARISON:  CT 04/21/2019.  Chest x-ray 02/10/2018. FINDINGS: Patient is  rotated to the right. Upper mediastinal evaluation is difficult due to patient rotation. Prominent left upper lobe mass again noted. Radiation seed clips are again noted within the mass. Similar findings noted on prior CT. No focal alveolar infiltrate. No pleural effusion or pneumothorax. Heart size normal. IMPRESSION: Prominent left upper lobe mass again noted. Radiation seed clips again noted within the mass. Similar appearance noted on prior CT of 04/21/2019. Electronically Signed   By: Marcello Moores  Register   On: 05/20/2019 12:28    Pending Labs Unresulted Labs (From admission, onward)    Start     Ordered   05/20/19 2200  Heparin level (unfractionated)  Once-Timed,   STAT     05/20/19 1359   05/20/19 1349  Novel Coronavirus, NAA (send-out to ref lab)  (Asymptomatic/Tier 2 Patients Labs)  Once,   STAT    Question Answer Comment  Is this test for diagnosis or screening Screening   Symptomatic for COVID-19 as defined by CDC No   Hospitalized for COVID-19 No   Admitted to ICU for COVID-19 No   Previously tested for COVID-19 No   Resident in a congregate (group) care setting No   Employed in healthcare setting No      05/20/19 1348   05/20/19 1327  Protime-INR  Add-on,   AD     05/20/19 1326   05/20/19 1327  APTT  Add-on,   AD     05/20/19 1326   Signed and Held  Basic metabolic panel  Tomorrow morning,   R     Signed and Held   Signed and Held  Lipid panel  Tomorrow morning,   R     Signed and Held   Signed and Held  CBC  Tomorrow morning,   R     Signed and Held          Vitals/Pain Today's Vitals   05/20/19 1145 05/20/19 1312  BP: (!) 89/74   Pulse: (!) 44   Resp: 14   Temp: 98.3 F (36.8 C)   TempSrc: Oral   SpO2: 97%   Weight:  144 lb (65.3 kg)  Height:   5\' 7"  (1.702 m)  PainSc: 0-No pain     Isolation Precautions No active isolations  Medications Medications  diltiazem (CARDIZEM) 25 MG/5ML injection (  Canceled Entry 05/20/19 1211)  lactated ringers bolus 500 mL ( Intravenous Canceled Entry 05/20/19 1301)  amiodarone (NEXTERONE PREMIX) 360-4.14 MG/200ML-% (1.8 mg/mL) IV infusion (60 mg/hr Intravenous New Bag/Given 05/20/19 1257)    Followed by  amiodarone (NEXTERONE PREMIX) 360-4.14 MG/200ML-% (1.8 mg/mL) IV infusion (has no administration in time range)  diltiazem (CARDIZEM) 1 mg/mL load via infusion 5 mg (5 mg Intravenous Bolus from Bag 05/20/19 1340)    And  diltiazem (CARDIZEM) 100 mg in dextrose 5 % 100 mL (1 mg/mL) infusion (5 mg/hr Intravenous New Bag/Given 05/20/19 1339)  heparin ADULT infusion 100 units/mL (25000 units/237mL sodium chloride 0.45%) (800 Units/hr Intravenous New Bag/Given 05/20/19 1333)  diltiazem (CARDIZEM) 1 mg/mL load via infusion 10 mg (10 mg Intravenous Bolus from Bag 05/20/19 1210)  fentaNYL (SUBLIMAZE) injection 50 mcg (50 mcg Intravenous Given 05/20/19 1230)  amiodarone (NEXTERONE) 1.8 mg/mL load via infusion 150 mg (150 mg Intravenous Bolus from Bag 05/20/19 1231)  amiodarone (NEXTERONE) IV bolus only 150 mg/100 mL (0 mg Intravenous Stopped 05/20/19 1251)  midazolam (VERSED) injection 2 mg (2 mg Intravenous Given 05/20/19 1245)    Mobility walks High fall risk   Focused Assessments Cardiac  Assessment Handoff:    Lab Results  Component Value Date   CKTOTAL 55 11/28/2013   CKMB 0.7 11/28/2013   TROPONINI <0.03 02/10/2018   No results found for: DDIMER Does the Patient currently have chest pain? No     R Recommendations: See Admitting Provider Note  Report given to: Malachy Mood, RN   Additional Notes:  Pt was cardioverted while in the ED. He is now on Amiodarone, Heparin and Cardizem gtt.

## 2019-05-20 NOTE — Progress Notes (Signed)
Name: Scott Gross MRN: [id number] DOB: 1934/07/18     CONSULTATION DATE: 05/20/2019  CHIEF COMPLAINT:  Arrythmia.  HISTORY OF PRESENT ILLNESS:  85 year old male with history of CAD (PTCA, 2013), with inferior STEMI in 08/2011 , cardiac arrest due to Vfib (08/2011), HLD, HTN, malingant neoplasm of LUL (undergoing radiation), seen 05/20/2019 in the ED after presenting to his Med Onc appointment that morning endorsing weakness, vomiting, headache, and pre-syncopal symptoms. Vital signs indicated an irregular rate and rhythm, with blood pressure 80s/50s. He was immediately brought to the ED for further management. Initial vital signs BP 89/74, with pulse 80's. Initial troponin (10), glucose, BNP (82.0) and TSH (0.975) unremarkable. CMP with Na 130, Cl 92, Ca 8.8, Cr 1.22, BUN 13, GFR 55 (previously >60 on 05/18/2019). EKG indicated atrial fibrillation w/ RVR. Diltiazam was started, with a subsequent drop in blood pressure, and was stoped. Patient was cardioverted, and NSR was achieved, but he soon after returned to afib with RVR. He was then started on IV heparin, and IV Amiodarone was given for rhythm control. Patient was brought to the ICU for further management.  Currently he is doing well with no concrete complaints. He is in NSR and rate, and his blood pressure is stable with MAP > 65. He has bilateral hearing loss at baseline. He denies chest pain, shortness of breath, palpitations, pre-syncope, dizziness, weakness, confusions, or focal neurologic deficits.   SIGNIFICANT EVENTS: 05/20/2019: Presented to Med/Onc appt. HR irregular. Patient sent to ED to r/o Afib. Initial EKG with Afib with RVR. Diltiazam started then d/c d/y hypotension. Cardioversion unsuccessful. Amiodarone and Diltiazam for rate/rhythm control. Heparin started. Cardiology consulted.   OVERNIGHT EVENTS: Diltiazem stopped at 1AM.   PAST MEDICAL HISTORY :   has a past medical history of Arthritis, CAD (coronary artery disease)  (Nov. 12, 2012), Cancer of upper lobe of left lung ([place]) (11/2016), Cardiac arrest [facility]) (Nov. 2012), Cardiac arrest - ventricular fibrillation, HOH (hard of hearing), Hyperlipidemia, Hypertension, Post PTCA (2013), and ST elevation MI (STEMI) ([place]) (08/26/2011).  has a past surgical history that includes Cardiac catheterization (2013); Coronary angioplasty; and Electormagnetic navigation bronchoscopy (N/A, 11/27/2016). Prior to Admission medications   Medication Sig Start Date End Date Taking? Authorizing Provider  furosemide (LASIX) 20 MG tablet Take 20 mg by mouth daily. As needed for edema 05/14/19 05/13/20 Yes [provider]  amLODipine (NORVASC) 5 MG tablet Take 5 mg by mouth as needed. For systolic pressure greater than 150    [provider]  aspirin 81 MG tablet Take 81 mg by mouth daily. Patient stopped on November 22, 2016 for procedure    [provider]  labetalol (NORMODYNE) 100 MG tablet  02/24/18   [provider]  lisinopril-hydrochlorothiazide (PRINZIDE,ZESTORETIC) 20-25 MG tablet Take 1 tablet by mouth 2 (two) times daily.    [provider]   Allergies  Allergen Reactions  . Statins Other (See Comments)    Arthralagia   FAMILY HISTORY:  family history includes Heart attack in his father and mother. SOCIAL HISTORY:  reports that he quit smoking about 7 years ago. His smoking use included cigarettes. He smoked 1.00 pack per day. He has never used smokeless tobacco. He reports that he does not drink alcohol or use drugs.  REVIEW OF SYSTEMS:   Review of Systems  Constitutional: Negative for chills, fever and malaise/fatigue.  HENT: Positive for hearing loss (At baseline bilateral hearing loss. ).   Eyes: Negative for blurred vision and  double vision.  Respiratory: Negative for cough, shortness of breath and wheezing.   Cardiovascular: Negative for chest pain, palpitations and leg swelling.  Gastrointestinal: Negative for abdominal  pain, blood in stool, constipation, diarrhea, nausea and vomiting.  Neurological: Negative for dizziness, tingling, sensory change, speech change, focal weakness, loss of consciousness, weakness and headaches.       No dysphagia.   Psychiatric/Behavioral: Negative for memory loss.    VITAL SIGNS: Temp:  [98.3 F (36.8 C)] 98.3 F (36.8 C) (08/05 1145) Pulse Rate:  [44] 44 (08/05 1145) Resp:  [14] 14 (08/05 1145) BP: (89)/(74) 89/74 (08/05 1145) SpO2:  [97 %] 97 % (08/05 1145) Weight:  [65.3 kg] 65.3 kg (08/05 1312)  No intake/output data recorded. No intake/output data recorded.  SpO2: 97 % O2 Flow Rate (L/min): 2 L/min  Physical Examination:  GENERAL: Pleasant male, sitting comfortably in bed. In no acute distress.  HEAD: Normocephalic, atraumatic.  EYES: Pupils equal, round, reactive to light.  No scleral icterus.  MOUTH:  Moist mucosal membrane.  NECK: Supple. No JVD. No carotid bruits. PULMONARY: CTAB.  CARDIOVASCULAR: S1 and S2. Regular rate and rhythm. No murmurs, rubs, or gallops.  GASTROINTESTINAL: Soft, nontender, non-distended. No masses. Positive bowel sounds. No hepatosplenomegaly.  EXTREMITIES: Upper: Radial pulses 2+ bilaterally.  Lower: DP pulses 2+ bilaterally. No edema. No unilateral swelling, or cording. NEUROLOGIC: No focal neurologic deficits. CN 2-12 grossly intact. Symmetric 5/5 Strength. Motor function intact. Sensory intact. DTRs 2+ at achilles, patella and brachioradialis.   SKIN:intact,warm,dry. No perspirations.   I personally reviewed lab work that was obtained in last 24 hrs.  MEDICATIONS: I have reviewed all medications and confirmed regimen as documented.  HS Troponins:  05/20/2019 - 10 (baseline), 63 in the afternoon.  CBC Latest Ref Rng & Units 05/21/2019 05/20/2019 05/18/2019  WBC 4.0 - 10.5 K/uL 10.0 13.0(H) 8.4  Hemoglobin 13.0 - 17.0 g/dL 12.3(L) 14.3 13.6  Hematocrit 39.0 - 52.0 % 35.4(L) 43.0 40.7  Platelets 150 - 400 K/uL 272 345 317    CMP Latest Ref Rng & Units 05/21/2019 05/20/2019 05/18/2019  Glucose 70 - 99 mg/dL 130(H) 179(H) 116(H)  BUN 8 - 23 mg/dL 8 13 12   Creatinine 0.61 - 1.24 mg/dL 0.61 1.22 0.92  Sodium 135 - 145 mmol/L 133(L) 130(L) 133(L)  Potassium 3.5 - 5.1 mmol/L 3.2(L) 3.8 3.9  Chloride 98 - 111 mmol/L 101 92(L) 95(L)  CO2 22 - 32 mmol/L 25 24 28   Calcium 8.9 - 10.3 mg/dL 7.9(L) 8.8(L) 8.9  Total Protein 6.5 - 8.1 g/dL - 7.2 6.5  Total Bilirubin 0.3 - 1.2 mg/dL - 1.4(H) 0.7  Alkaline Phos 38 - 126 U/L - 91 84  AST 15 - 41 U/L - 18 13(L)  ALT 0 - 44 U/L - 13 10   CULTURE RESULTS   No results found for this or any previous visit (from the past 240 hour(s)).     IMAGING   Dg Chest Portable 1 View  Result Date: 05/20/2019 CLINICAL DATA:  Atrial fibrillation.  Low blood pressure. EXAM: PORTABLE CHEST 1 VIEW COMPARISON:  CT 04/21/2019.  Chest x-ray 02/10/2018. FINDINGS: Patient is rotated to the right. Upper mediastinal evaluation is difficult due to patient rotation. Prominent left upper lobe mass again noted. Radiation seed clips are again noted within the mass. Similar findings noted on prior CT. No focal alveolar infiltrate. No pleural effusion or pneumothorax. Heart size normal. IMPRESSION: Prominent left upper lobe mass again noted. Radiation seed clips again noted  within the mass. Similar appearance noted on prior CT of 04/21/2019. Electronically Signed   By: Marcello Moores  Register   On: 05/20/2019 12:28   CXR Independently reviewed-by me today No acute process  EKG 05/20/2019: Rate > 200. Irregularly irregular rhythm. Consistent with Afib with RVR.   ASSESSMENT AND PLAN SYNOPSIS: 85 year old Caucasian male with history as stated above seen in the ICU for further management of new onset Afib with RVR. He is currently in NSR and rate controlled with use of Amiodarone. He is asymptomatic. Etiology likely secondary to dehydration.  CHA2DS2 VASC score > 2, thus he will require long term anticoagulation. He is also  considered at risk of hypercoagulably given his current treatment of lung adenocarcinoma.    Atrial fibrillation w/ RVR - Cardiology following.  - Continue rate and rhythm control with Amiodarone. Plan to transition to PO Amiodarone. - Continue short term anticoagulation with IV Heparin. - CHA2DS2VASC score > 2 --> will require long term anticoagulation. Cardiology to make recommendations. - Follow up outpatient Cardiology.   Hypotension - Will continue to monitor. - Vasopressors if MAP < 65.   ELECTROLYTES - Hypokalemia - Continue supplementation KCl PO PRN.  - follow labs as needed.  - replace as needed -  pharmacy consultation and following  LUL Lung AdenoCa - O2 as needed.   CARDIAC ICU monitoring  GI GI PROPHYLAXIS as indicated  NUTRITIONAL STATUS DIET--> Heart healthy, as as tolerated Constipation protocol as indicated  ENDO - will use ICU hypoglycemic\Hyperglycemia protocol if needed  DVT/GI PRX ordered - Heparin TRANSFUSIONS AS NEEDED MONITOR FSBS ASSESS the need for LABS   Disposition: Discharge to home.   Corrin Parker, M.D.  [facility]  Medical Director [place] Director [facility] Cardio-Pulmonary Department

## 2019-05-20 NOTE — Consult Note (Addendum)
ANTICOAGULATION CONSULT NOTE - Initial Consult  Pharmacy Consult for Heparin Dosing Indication: atrial fibrillation  Allergies  Allergen Reactions  . Statins Other (See Comments)    Arthralagia    Patient Measurements: Height: 5\' 7"  (170.2 cm) Weight: 144 lb (65.3 kg) IBW/kg (Calculated) : 66.1 Heparin Dosing Weight: 65.3 kg  Vital Signs: Temp: 98.3 F (36.8 C) (08/05 1145) Temp Source: Oral (08/05 1145) BP: 89/74 (08/05 1145) Pulse Rate: 44 (08/05 1145)  Labs: Recent Labs    05/18/19 1413 05/20/19 1154  HGB 13.6 14.3  HCT 40.7 43.0  PLT 317 345  CREATININE 0.92 1.22  TROPONINIHS  --  10    Estimated Creatinine Clearance: 43.1 mL/min (by C-G formula based on SCr of 1.22 mg/dL).   Medical History: Past Medical History:  Diagnosis Date  . Arthritis   . CAD (coronary artery disease) Nov. 12, 2012   Inferior ST elevation MI in November of 2012 with ventricular fibrillation arrest. Status post drug-eluting stent placement to the mid LAD. Most recent cardiac catheterization in June of this year showed patent stent with minimal restenosis, 75% proximal LAD and 80% distal LAD which were unchanged from before. Mild disease in the left circumflex and moderate RCA disease. Ejection fraction was 55%.  . Cancer of upper lobe of left lung ([place]) 11/2016   Rad tx's.  . Cardiac arrest [facility]) Nov. 2012   x 2   . Cardiac arrest - ventricular fibrillation   . HOH (hard of hearing)    Left Hearing Aid  . Hyperlipidemia   . Hypertension   . Post PTCA 2013   x2  . ST elevation MI (STEMI) ([place]) 08/26/2011    Medications:  (Not in a hospital admission)  Scheduled:  . diltiazem      . heparin  4,000 Units Intravenous Once   Infusions:  . amiodarone 60 mg/hr (05/20/19 1257)   Followed by  . amiodarone    . heparin    . heparin    . lactated ringers     PRN:  Anti-infectives (From admission, onward)   None      Assessment: Pharmacy has been consulted to initiate  Heparin Dosing on 84yo patient complaining of chest pain. Patient has no prior history of anticoagulant use. Baseline labs have been ordered and results are pending.    Goal of Therapy:  Heparin level 0.3-0.7 units/ml Monitor platelets by anticoagulation protocol: Yes   Plan:  Patient received 4000 units bolus, so will not rebolus. Start heparin infusion at 800 units/hr Check anti-Xa level in 8 hours and daily while on heparin Continue to monitor H&H and platelets  Audrianna Driskill A Collyn Selk 05/20/2019,1:18 PM

## 2019-05-20 NOTE — ED Provider Notes (Signed)
[facility] Emergency Department Provider Note   ____________________________________________   First MD Initiated Contact with Patient 05/20/19 1201     (approximate)  I have reviewed the triage vital signs and the nursing notes.   HISTORY  Chief Complaint Generalized weakness   HPI Scott Gross is a 85 y.o. male with past medical history of CAD, lung cancer on radiation presents to the ED complaining of generalized weakness.  Patient reports he has been feeling increasingly weak over the past few days and when he was brought to see his oncologist today, was noted to have a rapid heart rate of 180-200, subsequently sent to the ED for further evaluation.  Patient reports he has not noticed any palpitations, is not sure how long his heart has been racing but when he checked his pulse ox this morning his heart rate was about 110.  He denies any chest pain and has not had any recent fevers, chills, cough, or shortness of breath.  He has not noticed any swelling in his legs.  He denies any history of abnormal heart rhythms.        Past Medical History:  Diagnosis Date  . Arthritis   . CAD (coronary artery disease) Nov. 12, 2012   Inferior ST elevation MI in November of 2012 with ventricular fibrillation arrest. Status post drug-eluting stent placement to the mid LAD. Most recent cardiac catheterization in June of this year showed patent stent with minimal restenosis, 75% proximal LAD and 80% distal LAD which were unchanged from before. Mild disease in the left circumflex and moderate RCA disease. Ejection fraction was 55%.  . Cancer of upper lobe of left lung ([place]) 11/2016   Rad tx's.  . Cardiac arrest [facility]) Nov. 2012   x 2   . Cardiac arrest - ventricular fibrillation   . HOH (hard of hearing)    Left Hearing Aid  . Hyperlipidemia   . Hypertension   . Post PTCA 2013   x2  . ST elevation MI (STEMI) ([place]) 08/26/2011    Patient Active Problem List   Diagnosis Date Noted  . A-fib ([place]) 05/20/2019  . Cardiac arrest ([place])   . Diabetes mellitus type 2, uncomplicated ([place]) 22/29/7989  . Angina pectoris ([place]) 12/17/2016  . Adenopathy   . Cancer of upper lobe of left lung ([place]) 09/10/2016  . Hyperlipidemia   . Hypertension   . CAD (coronary artery disease) 08/27/2011  . Myocardial infarction ([place]) 08/16/2011    Past Surgical History:  Procedure Laterality Date  . CARDIAC CATHETERIZATION  2013   @ [place]; [facility] s/p stent   . CORONARY ANGIOPLASTY    . ELECTROMAGNETIC NAVIGATION BROCHOSCOPY N/A 11/27/2016   Procedure: ELECTROMAGNETIC NAVIGATION BRONCHOSCOPY;  Surgeon: [name], MD;  Location: [facility] ORS;  Service: Cardiopulmonary;  Laterality: N/A;    Prior to Admission medications   Medication Sig Start Date End Date Taking? Authorizing Provider  aspirin 81 MG tablet Take 81 mg by mouth daily. Patient stopped on November 22, 2016 for procedure   Yes [provider]  furosemide (LASIX) 20 MG tablet Take 20 mg by mouth daily. As needed for edema 05/14/19 05/13/20 Yes [provider]  labetalol (NORMODYNE) 100 MG tablet Take 100 mg by mouth once.  02/24/18  Yes [provider]  lisinopril-hydrochlorothiazide (PRINZIDE,ZESTORETIC) 20-25 MG tablet Take 1 tablet by mouth 2 (two) times daily.   Yes [provider]  amLODipine (NORVASC) 5 MG tablet Take 5 mg by mouth as  needed. For systolic pressure greater than 150    [provider]    Allergies Statins  Family History  Problem Relation Age of Onset  . Heart attack Mother   . Heart attack Father     Social History Social History   Tobacco Use  . Smoking status: Former Smoker    Packs/day: 1.00    Types: Cigarettes    Quit date: 08/27/2011    Years since quitting: 7.7  . Smokeless tobacco: Never Used  Substance Use Topics  . Alcohol use: No  . Drug use: No    Review of Systems  Constitutional: No fever/chills.  Positive for  generalized weakness. Eyes: No visual changes. ENT: No sore throat. Cardiovascular: Denies chest pain. Respiratory: Denies shortness of breath. Gastrointestinal: No abdominal pain.  No nausea, no vomiting.  No diarrhea.  No constipation. Genitourinary: Negative for dysuria. Musculoskeletal: Negative for back pain. Skin: Negative for rash. Neurological: Negative for headaches, focal weakness or numbness.  ____________________________________________   PHYSICAL EXAM:  VITAL SIGNS: ED Triage Vitals [05/20/19 1145]  Enc Vitals Group     BP (!) 89/74     Pulse Rate (!) 44     Resp 14     Temp 98.3 F (36.8 C)     Temp Source Oral     SpO2 97 %     Weight      Height      Head Circumference      Peak Flow      Pain Score 0     Pain Loc      Pain Edu?      Excl. in [place]?     Constitutional: Alert and oriented. Eyes: Conjunctivae are normal. Head: Atraumatic. Nose: No congestion/rhinnorhea. Mouth/Throat: Mucous membranes are moist. Neck: Normal ROM Cardiovascular: Tachycardic, irregularly irregular rhythm. Grossly normal heart sounds. Respiratory: Normal respiratory effort.  No retractions. Lungs CTAB. Gastrointestinal: Soft and nontender. No distention. Genitourinary: deferred Musculoskeletal: No lower extremity tenderness nor edema. Neurologic:  Normal speech and language. No gross focal neurologic deficits are appreciated. Skin:  Skin is warm, dry and intact. No rash noted. Psychiatric: Mood and affect are normal. Speech and behavior are normal.  ____________________________________________   LABS (all labs ordered are listed, but only abnormal results are displayed)  Labs Reviewed  COMPREHENSIVE METABOLIC PANEL - Abnormal; Notable for the following components:      Result Value   Sodium 130 (*)    Chloride 92 (*)    Glucose, Bld 179 (*)    Calcium 8.8 (*)    Total Bilirubin 1.4 (*)    GFR calc non Af Amer 55 (*)    All other components within normal limits   CBC WITH DIFFERENTIAL/PLATELET - Abnormal; Notable for the following components:   WBC 13.0 (*)    Neutro Abs 10.2 (*)    Monocytes Absolute 1.2 (*)    Abs Immature Granulocytes 0.13 (*)    All other components within normal limits  NOVEL CORONAVIRUS, NAA (HOSPITAL ORDER, SEND-OUT TO REF LAB)  TSH  MAGNESIUM  BRAIN NATRIURETIC PEPTIDE  PROTIME-INR  APTT  HEPARIN LEVEL (UNFRACTIONATED)  TROPONIN I (HIGH SENSITIVITY)  TROPONIN I (HIGH SENSITIVITY)   ____________________________________________  EKG  ED ECG REPORT I, Blake Divine, the attending physician, personally viewed and interpreted this ECG.   Date: 05/20/2019  EKG Time: 11:48 AM  Rate: 204  Rhythm: atrial fibrillation, rate 204  Axis: Normal  Intervals:none  ST&T Change: None   ____________________________________________  PROCEDURES  Procedure(s) performed (including Critical Care):  .Critical Care Performed by: Blake Divine, MD Authorized by: Blake Divine, MD   Critical care provider statement:    Critical care time (minutes):  45   Critical care was necessary to treat or prevent imminent or life-threatening deterioration of the following conditions:  Shock and circulatory failure   Critical care was time spent personally by me on the following activities:  Discussions with consultants, evaluation of patient's response to treatment, examination of patient, ordering and performing treatments and interventions, ordering and review of laboratory studies, ordering and review of radiographic studies, pulse oximetry, re-evaluation of patient's condition, obtaining history from patient or surrogate and review of old charts   I assumed direction of critical care for this patient from another provider in my specialty: no       ____________________________________________   INITIAL IMPRESSION / ASSESSMENT AND PLAN / ED COURSE       85 year old male with history of lung cancer currently on radiation  therapy as well as CAD presenting to the ED with generalized weakness, noted to have tacky dysrhythmia at his oncologist office.  Differential includes atrial fibrillation with RVR, atrial flutter, SVT, ventricular tachycardia, ACS, CHF, electrolyte abnormality.  EKG consistent with A. fib with RVR, very tachycardic to 200s.  On initial evaluation, patient's blood pressure was very borderline, attempted rate control with Cardizem, however patient's blood pressure did not tolerate this.  Subsequently, decision made with cardiology to perform cardioversion.  Patient and daughter at bedside expressed understanding of risks and benefits, consented to procedure.  Given borderline blood pressure, sedation and analgesia options limited, as such patient was given single dose of 50 mcg of fentanyl.  He initially converted to sinus rhythm, however quickly returned to rapid A. fib.  Decision made in conjunction with cardiology to start patient on amiodarone, with plan to add diltiazem as patient's blood pressure allowed.  Patient additionally given bolus of heparin and started on drip.  Patient's blood pressure improved and low-dose Cardizem added with improved control of patient's rate.  Patient discussed with hospitalist, and he was accepted for admission to stepdown.      ____________________________________________   FINAL CLINICAL IMPRESSION(S) / ED DIAGNOSES  Final diagnoses:  Atrial fibrillation with RVR ([place])  Cancer of upper lobe of left lung ([place])  Coronary artery disease involving native heart without angina pectoris, unspecified vessel or lesion type     ED Discharge Orders    None       Note:  This document was prepared using Dragon voice recognition software and may include unintentional dictation errors.   Blake Divine, MD 05/20/19 1455

## 2019-05-20 NOTE — Consult Note (Signed)
Name: Scott Gross MRN: [id number] DOB: 1933-11-08    ADMISSION DATE:  05/20/2019 CONSULTATION DATE:  05/20/2019  REFERRING MD :  Dr. Estanislado Pandy  CHIEF COMPLAINT:  Generalized weakness, Tachycardia  BRIEF PATIENT DESCRIPTION:  85 y.o. Male with PMH of Lung Cancer on Radiation therapy, CAD, HTN admitted 8/5 for A-fib w/ RVR requiring Amiodarone and Cardizem drips. Cardiology following.  SIGNIFICANT EVENTS  8/5>>Failed Cardioversion in ED 8/5>> Admission to Stepdown  STUDIES:  N/A  CULTURES: MRSA PCR 8/5>> Negative SARS-CoV-2 PCR 8/5>> Negative  ANTIBIOTICS: N/A  HISTORY OF PRESENT ILLNESS:   Scott Gross is a 85 year old male with a past medical history notable for lung cancer on radiation therapy, CAD who presents to [facility] ED on 05/20/2019 after being referred from his oncologist office due to rapid heart rate in the 180s to 200s.  He had been complaining of generalized weakness over the past few days, and thus had went to see his oncologist.  Upon presentation to the ED he was noted to be in atrial fibrillation with RVR with marginal BP. He denied chest pain, shortness of breath, fever, chills, or edema.  Initially rate control was attempted with Cardizem, however his blood pressure did not tolerate it.  Subsequently cardiology performed cardioversion which he initially converted to sinus rhythm, however quickly returned to rapid A. Fib.  He was then started on amiodarone infusion with plans to add diltiazem as blood pressure permitted.  He was also placed on heparin drip for anticoagulation.  Blood work revealed high-sensitivity troponin 10, BNP 82.  His SARS-CoV-2 PCR is negative.  He was admitted to stepdown unit for further work-up and treatment of atrial fibrillation with RVR requiring amiodarone and Cardizem drips.  PCCM is consulted for further management.  PAST MEDICAL HISTORY :   has a past medical history of Arthritis, CAD (coronary artery disease) (Nov. 12, 2012), Cancer of  upper lobe of left lung ([place]) (11/2016), Cardiac arrest [facility]) (Nov. 2012), Cardiac arrest - ventricular fibrillation, HOH (hard of hearing), Hyperlipidemia, Hypertension, Post PTCA (2013), and ST elevation MI (STEMI) ([place]) (08/26/2011).  has a past surgical history that includes Cardiac catheterization (2013); Coronary angioplasty; and Electormagnetic navigation bronchoscopy (N/A, 11/27/2016). Prior to Admission medications   Medication Sig Start Date End Date Taking? Authorizing Provider  aspirin 81 MG tablet Take 81 mg by mouth daily. Patient stopped on November 22, 2016 for procedure   Yes [provider]  furosemide (LASIX) 20 MG tablet Take 20 mg by mouth daily. As needed for edema 05/14/19 05/13/20 Yes [provider]  labetalol (NORMODYNE) 100 MG tablet Take 100 mg by mouth once.  02/24/18  Yes [provider]  lisinopril-hydrochlorothiazide (PRINZIDE,ZESTORETIC) 20-25 MG tablet Take 1 tablet by mouth 2 (two) times daily.   Yes [provider]  amLODipine (NORVASC) 5 MG tablet Take 5 mg by mouth as needed. For systolic pressure greater than 150    [provider]   Allergies  Allergen Reactions  . Statins Other (See Comments)    Arthralagia    FAMILY HISTORY:  family history includes Heart attack in his father and mother. SOCIAL HISTORY:  reports that he quit smoking about 7 years ago. His smoking use included cigarettes. He smoked 1.00 pack per day. He has never used smokeless tobacco. He reports that he does not drink alcohol or use drugs.   COVID-19 DISASTER DECLARATION:  FULL CONTACT PHYSICAL EXAMINATION WAS NOT POSSIBLE DUE TO TREATMENT OF COVID-19 AND  CONSERVATION OF PERSONAL PROTECTIVE  EQUIPMENT, LIMITED EXAM FINDINGS INCLUDE-  Patient assessed or the symptoms described in the history of present illness.  In the context of the Global COVID-19 pandemic, which necessitated consideration that the patient might be at risk for infection  with the SARS-CoV-2 virus that causes COVID-19, Institutional protocols and algorithms that pertain to the evaluation of patients at risk for COVID-19 are in a state of rapid change based on information released by regulatory bodies including the CDC and federal and state organizations. These policies and algorithms were followed during the patient's care while in hospital.  REVIEW OF SYSTEMS:  Positives in BOLD: All ROS negative Constitutional: Negative for fever, chills, weight loss, malaise/fatigue and diaphoresis.  HENT: Negative for hearing loss, ear pain, nosebleeds, congestion, sore throat, neck pain, tinnitus and ear discharge.   Eyes: Negative for blurred vision, double vision, photophobia, pain, discharge and redness.  Respiratory: Negative for cough, hemoptysis, sputum production, shortness of breath, wheezing and stridor.   Cardiovascular: Negative for chest pain, palpitations, orthopnea, claudication, leg swelling and PND.  Gastrointestinal: Negative for heartburn, nausea, vomiting, abdominal pain, diarrhea, constipation, blood in stool and melena.  Genitourinary: Negative for dysuria, urgency, frequency, hematuria and flank pain.  Musculoskeletal: Negative for myalgias, back pain, joint pain and falls.  Skin: Negative for itching and rash.  Neurological: Negative for dizziness, tingling, tremors, sensory change, speech change, focal weakness, seizures, loss of consciousness, weakness and headaches.  Endo/Heme/Allergies: Negative for environmental allergies and polydipsia. Does not bruise/bleed easily.  SUBJECTIVE:  Pt reports decreased appetite with radiation Denies chest pain, shortness of breath, palpations, edema On room air On Amiodarone and Cardizem infusions Hemodynamically stable  VITAL SIGNS: Temp:  [97.4 F (36.3 C)-98.4 F (36.9 C)] 98.4 F (36.9 C) (08/05 2004) Pulse Rate:  [44-86] 72 (08/05 2004) Resp:  [10-24] 18 (08/05 2004) BP: (89-129)/(68-83) 120/68 (08/05  2004) SpO2:  [96 %-100 %] 98 % (08/05 2004) Weight:  [65.3 kg-65.5 kg] 65.5 kg (08/05 1845)  PHYSICAL EXAMINATION: General: Acutely ill-appearing male, sitting in bed, on room air, no acute distress Neuro: Sleeping, arouses to voice, alert and oriented x3, follows commands, no focal deficits, speech clear HEENT: Atraumatic, normocephalic, neck supple, no JVD, pupils PERRLA Cardiovascular: Regular rate and rhythm, S1-S2, no murmurs rubs or gallops, 2+ pulses Lungs: Clear to auscultation bilaterally, even, nonlabored, normal effort Abdomen: Soft, nontender, nondistended, no guarding or rebound tenderness, bowel sounds positive x4 Musculoskeletal: Normal bulk and tone, no deformities, no edema Skin: Warm and dry, no obvious rashes, lesions, or ulcerations  Recent Labs  Lab 05/18/19 1413 05/20/19 1154  NA 133* 130*  K 3.9 3.8  CL 95* 92*  CO2 28 24  BUN 12 13  CREATININE 0.92 1.22  GLUCOSE 116* 179*   Recent Labs  Lab 05/18/19 1413 05/20/19 1154  HGB 13.6 14.3  HCT 40.7 43.0  WBC 8.4 13.0*  PLT 317 345   Dg Chest Portable 1 View  Result Date: 05/20/2019 CLINICAL DATA:  Atrial fibrillation.  Low blood pressure. EXAM: PORTABLE CHEST 1 VIEW COMPARISON:  CT 04/21/2019.  Chest x-ray 02/10/2018. FINDINGS: Patient is rotated to the right. Upper mediastinal evaluation is difficult due to patient rotation. Prominent left upper lobe mass again noted. Radiation seed clips are again noted within the mass. Similar findings noted on prior CT. No focal alveolar infiltrate. No pleural effusion or pneumothorax. Heart size normal. IMPRESSION: Prominent left upper lobe mass again noted. Radiation seed clips again noted within the mass. Similar appearance noted on prior CT  of 04/21/2019. Electronically Signed   By: Marcello Moores  Register   On: 05/20/2019 12:28    ASSESSMENT / PLAN:  A. Fib w/ RVR Hypotension>>resolved Hx: CAD w/ stent, HTN -Cardiac monitoring -Maintain MAP >65 -IV Fluids  -Neo-synephrine if needed to maintain MAP goal -Cardiology following, appreciate input -Failed cardioversion  -Continue IV Amiodarone & Cardizem drips as per Cardiology -Continue Heparin drip for Anticoagulation -Continue Aspirin  Lung Cancer -Supplemental O2 as needed to maintain O2 sats>92% -Radiation therapy as outpatient         DISPOSITION: Stepdown GOALS OF CARE: Full code VTE PROPHYLAXIS: Heparin drip for anticoagulation UPDATES: Updated pt at bedside 05/20/19  Darel Hong, [facility] [place] Pager: [phone] Cell: [phone]  05/20/2019, 8:14 PM

## 2019-05-20 NOTE — Telephone Encounter (Signed)
I spoke with Scott Gross regarding staying to see the NP on Tuesday since he left after labs Monday.  His response to me was " Can't we do that tomorrow, I have someone out there waiting for me."  I notified the NP, Scott Gross that he was unable to stay to see her.  He also told me that his primary physician does lab work regularly on him and  always tell him that all of his labs are good and shows no reason for how he feels.   When seen by Dr. Baruch Gouty on Tuesday, he had no complaints and stated that he was feeling better.

## 2019-05-20 NOTE — H&P (Signed)
[facility] [place] NAME: Scott Gross    MR#:  [id number]  DATE OF BIRTH:  1934/05/21  DATE OF ADMISSION:  05/20/2019  PRIMARY CARE PHYSICIAN: [name], MD   REQUESTING/REFERRING PHYSICIAN:   CHIEF COMPLAINT: Nausea and weakness  HISTORY OF PRESENT ILLNESS: Scott Gross  is a 85 y.o. male with a known history of lung cancer, coronary disease, cardiac stent, hyperlipidemia, hypertension currently on radiation therapy.  Patient feeling weak and has some nausea.  Was evaluated in the emergency room was found to be having atrial fibrillation.  He was given IV amiodarone loading dose and started on infusion.  Cardioversion was tried but patient failed.  Patient started on IV diltiazem drip for rate control.  Blood pressure has been soft.  No complaints of chest pain.  COVID-19 test pending.  PAST MEDICAL HISTORY:   Past Medical History:  Diagnosis Date  . Arthritis   . CAD (coronary artery disease) Nov. 12, 2012   Inferior ST elevation MI in November of 2012 with ventricular fibrillation arrest. Status post drug-eluting stent placement to the mid LAD. Most recent cardiac catheterization in June of this year showed patent stent with minimal restenosis, 75% proximal LAD and 80% distal LAD which were unchanged from before. Mild disease in the left circumflex and moderate RCA disease. Ejection fraction was 55%.  . Cancer of upper lobe of left lung ([place]) 11/2016   Rad tx's.  . Cardiac arrest [facility]) Nov. 2012   x 2   . Cardiac arrest - ventricular fibrillation   . HOH (hard of hearing)    Left Hearing Aid  . Hyperlipidemia   . Hypertension   . Post PTCA 2013   x2  . ST elevation MI (STEMI) ([place]) 08/26/2011    PAST SURGICAL HISTORY:  Past Surgical History:  Procedure Laterality Date  . CARDIAC CATHETERIZATION  2013   @ [place]; [facility] s/p stent   . CORONARY ANGIOPLASTY    . ELECTROMAGNETIC NAVIGATION BROCHOSCOPY N/A 11/27/2016    Procedure: ELECTROMAGNETIC NAVIGATION BRONCHOSCOPY;  Surgeon: [name], MD;  Location: [facility] ORS;  Service: Cardiopulmonary;  Laterality: N/A;    SOCIAL HISTORY:  Social History   Tobacco Use  . Smoking status: Former Smoker    Packs/day: 1.00    Types: Cigarettes    Quit date: 08/27/2011    Years since quitting: 7.7  . Smokeless tobacco: Never Used  Substance Use Topics  . Alcohol use: No    FAMILY HISTORY:  Family History  Problem Relation Age of Onset  . Heart attack Mother   . Heart attack Father     DRUG ALLERGIES:  Allergies  Allergen Reactions  . Statins Other (See Comments)    Arthralagia    REVIEW OF SYSTEMS:   CONSTITUTIONAL: No fever,has fatigue and weakness.  EYES: No blurred or double vision.  EARS, NOSE, AND THROAT: No tinnitus or ear pain.  RESPIRATORY: No cough, shortness of breath, wheezing or hemoptysis.  CARDIOVASCULAR: No chest pain, orthopnea, edema.  GASTROINTESTINAL: No nausea, vomiting, diarrhea or abdominal pain.  GENITOURINARY: No dysuria, hematuria.  ENDOCRINE: No polyuria, nocturia,  HEMATOLOGY: No anemia, easy bruising or bleeding SKIN: No rash or lesion. MUSCULOSKELETAL: No joint pain or arthritis.   NEUROLOGIC: No tingling, numbness, weakness.  PSYCHIATRY: No anxiety or depression.   MEDICATIONS AT HOME:  Prior to Admission medications   Medication Sig Start Date End Date Taking? Authorizing Provider  aspirin 81 MG tablet Take 81  mg by mouth daily. Patient stopped on November 22, 2016 for procedure   Yes [provider]  furosemide (LASIX) 20 MG tablet Take 20 mg by mouth daily. As needed for edema 05/14/19 05/13/20 Yes [provider]  labetalol (NORMODYNE) 100 MG tablet Take 100 mg by mouth once.  02/24/18  Yes [provider]  lisinopril-hydrochlorothiazide (PRINZIDE,ZESTORETIC) 20-25 MG tablet Take 1 tablet by mouth 2 (two) times daily.   Yes [provider]  amLODipine (NORVASC) 5 MG tablet  Take 5 mg by mouth as needed. For systolic pressure greater than 150    [provider]      PHYSICAL EXAMINATION:   VITAL SIGNS: Blood pressure (!) 89/74, pulse (!) 44, temperature 98.3 F (36.8 C), temperature source Oral, resp. rate 14, height 5\' 7"  (1.702 m), weight 65.3 kg, SpO2 97 %.  GENERAL:  85 y.o.-year-old patient lying in the bed with no acute distress.  EYES: Pupils equal, round, reactive to light and accommodation. No scleral icterus. Extraocular muscles intact.  HEENT: Head atraumatic, normocephalic. Oropharynx and nasopharynx clear.  NECK:  Supple, no jugular venous distention. No thyroid enlargement, no tenderness.  LUNGS: Normal breath sounds bilaterally, no wheezing, rales,rhonchi or crepitation. No use of accessory muscles of respiration.  CARDIOVASCULAR: S1, S2 irregular. No murmurs, rubs, or gallops.  ABDOMEN: Soft, nontender, nondistended. Bowel sounds present. No organomegaly or mass.  EXTREMITIES: No pedal edema, cyanosis, or clubbing.  NEUROLOGIC: Cranial nerves II through XII are intact. Muscle strength 5/5 in all extremities. Sensation intact. Gait not checked.  PSYCHIATRIC: The patient is alert and oriented x 3.  SKIN: No obvious rash, lesion, or ulcer.   LABORATORY PANEL:   CBC Recent Labs  Lab 05/18/19 1413 05/20/19 1154  WBC 8.4 13.0*  HGB 13.6 14.3  HCT 40.7 43.0  PLT 317 345  MCV 86.4 87.8  MCH 28.9 29.2  MCHC 33.4 33.3  RDW 12.2 12.4  LYMPHSABS 1.4 1.2  MONOABS 1.1* 1.2*  EOSABS 0.1 0.1  BASOSABS 0.0 0.1   ------------------------------------------------------------------------------------------------------------------  Chemistries  Recent Labs  Lab 05/18/19 1413 05/20/19 1154  NA 133* 130*  K 3.9 3.8  CL 95* 92*  CO2 28 24  GLUCOSE 116* 179*  BUN 12 13  CREATININE 0.92 1.22  CALCIUM 8.9 8.8*  MG 2.1 1.8  AST 13* 18  ALT 10 13  ALKPHOS 84 91  BILITOT 0.7 1.4*    ------------------------------------------------------------------------------------------------------------------ estimated creatinine clearance is 43.1 mL/min (by C-G formula based on SCr of 1.22 mg/dL). ------------------------------------------------------------------------------------------------------------------ Recent Labs    05/20/19 1154  TSH 0.975     Coagulation profile No results for input(s): INR, PROTIME in the last 168 hours. ------------------------------------------------------------------------------------------------------------------- No results for input(s): DDIMER in the last 72 hours. -------------------------------------------------------------------------------------------------------------------  Cardiac Enzymes No results for input(s): CKMB, TROPONINI, MYOGLOBIN in the last 168 hours.  Invalid input(s): CK ------------------------------------------------------------------------------------------------------------------ Invalid input(s): POCBNP  ---------------------------------------------------------------------------------------------------------------  Urinalysis    Component Value Date/Time   COLORURINE COLORLESS (A) 11/13/2015 2039   APPEARANCEUR CLEAR (A) 11/13/2015 2039   APPEARANCEUR Clear 11/28/2013 0605   LABSPEC 1.003 (L) 11/13/2015 2039   LABSPEC 1.010 11/28/2013 0605   PHURINE 6.0 11/13/2015 2039   GLUCOSEU 50 (A) 11/13/2015 2039   GLUCOSEU 50 mg/dL 11/28/2013 0605   HGBUR NEGATIVE 11/13/2015 2039   BILIRUBINUR NEGATIVE 11/13/2015 2039   BILIRUBINUR Negative 11/28/2013 0605   KETONESUR NEGATIVE 11/13/2015 2039   PROTEINUR NEGATIVE 11/13/2015 2039   NITRITE NEGATIVE 11/13/2015 2039   LEUKOCYTESUR NEGATIVE 11/13/2015 2039  LEUKOCYTESUR Negative 11/28/2013 2446     RADIOLOGY: Dg Chest Portable 1 View  Result Date: 05/20/2019 CLINICAL DATA:  Atrial fibrillation.  Low blood pressure. EXAM: PORTABLE CHEST 1 VIEW COMPARISON:  CT  04/21/2019.  Chest x-ray 02/10/2018. FINDINGS: Patient is rotated to the right. Upper mediastinal evaluation is difficult due to patient rotation. Prominent left upper lobe mass again noted. Radiation seed clips are again noted within the mass. Similar findings noted on prior CT. No focal alveolar infiltrate. No pleural effusion or pneumothorax. Heart size normal. IMPRESSION: Prominent left upper lobe mass again noted. Radiation seed clips again noted within the mass. Similar appearance noted on prior CT of 04/21/2019. Electronically Signed   By: Marcello Moores  Register   On: 05/20/2019 12:28    EKG: Orders placed or performed during the hospital encounter of 05/20/19  . EKG 12-lead    IMPRESSION AND PLAN: 85 year old male patient with a known history of lung cancer, coronary disease, cardiac stent, hyperlipidemia, hypertension currently on radiation therapy.   -New onset atrial fibrillation Continue IV amiodarone infusion Status post cardiology evaluation Failed cardioversion Continue IV diltiazem drip Admit to stepdown unit On heparin drip for anticoagulation  -Hypotension IV fluids If needed IV pressor medication  -Lung cancer Radiation therapy follow-up as outpatient  -Coronary artery disease with history of cardiac stent Continue aspirin  -DVT prophylaxis On anticoagulation with heparin drip   All the records are reviewed and case discussed with ED provider. Management plans discussed with the patient, family and they are in agreement.  CODE STATUS:Full code    Code Status Orders  (From admission, onward)         Start     Ordered   05/20/19 1450  Full code  Continuous     05/20/19 1449        Code Status History    This patient has a current code status but no historical code status.   Advance Care Planning Activity       TOTAL TIME TAKING CARE OF THIS PATIENT: 53 minutes.    Saundra Shelling M.D on 05/20/2019 at 2:57 PM  Between 7am to 6pm - Pager -  [phone]  After 6pm go to [email or web address] - password [email or web address] [facility]  [facility]  Office  [phone]  CC: Primary care physician; [name], MD

## 2019-05-21 ENCOUNTER — Ambulatory Visit: Payer: Medicare HMO

## 2019-05-21 DIAGNOSIS — I4891 Unspecified atrial fibrillation: Principal | ICD-10-CM

## 2019-05-21 LAB — LIPID PANEL
Cholesterol: 110 mg/dL (ref 0–200)
HDL: 31 mg/dL — ABNORMAL LOW (ref >40)
LDL Cholesterol: 66 mg/dL (ref 0–99)
Total CHOL/HDL Ratio: 3.5 RATIO
Triglycerides: 63 mg/dL (ref <150)
VLDL: 13 mg/dL (ref 0–40)

## 2019-05-21 LAB — CBC
HCT: 35.4 % — ABNORMAL LOW (ref 39.0–52.0)
Hemoglobin: 12.3 g/dL — ABNORMAL LOW (ref 13.0–17.0)
MCH: 29.6 pg (ref 26.0–34.0)
MCHC: 34.7 g/dL (ref 30.0–36.0)
MCV: 85.1 fL (ref 80.0–100.0)
Platelets: 272 10*3/uL (ref 150–400)
RBC: 4.16 MIL/uL — ABNORMAL LOW (ref 4.22–5.81)
RDW: 12.4 % (ref 11.5–15.5)
WBC: 10 10*3/uL (ref 4.0–10.5)
nRBC: 0 % (ref 0.0–0.2)

## 2019-05-21 LAB — GLUCOSE, CAPILLARY: Glucose-Capillary: 179 mg/dL — ABNORMAL HIGH (ref 70–99)

## 2019-05-21 LAB — BASIC METABOLIC PANEL WITH GFR
Anion gap: 7 (ref 5–15)
BUN: 8 mg/dL (ref 8–23)
CO2: 25 mmol/L (ref 22–32)
Calcium: 7.9 mg/dL — ABNORMAL LOW (ref 8.9–10.3)
Chloride: 101 mmol/L (ref 98–111)
Creatinine, Ser: 0.61 mg/dL (ref 0.61–1.24)
GFR calc Af Amer: >60 mL/min
GFR calc non Af Amer: >60 mL/min
Glucose, Bld: 130 mg/dL — ABNORMAL HIGH (ref 70–99)
Potassium: 3.2 mmol/L — ABNORMAL LOW (ref 3.5–5.1)
Sodium: 133 mmol/L — ABNORMAL LOW (ref 135–145)

## 2019-05-21 LAB — MAGNESIUM: Magnesium: 1.8 mg/dL (ref 1.7–2.4)

## 2019-05-21 LAB — HEPARIN LEVEL (UNFRACTIONATED): Heparin Unfractionated: 0.31 IU/mL (ref 0.30–0.70)

## 2019-05-21 MED ORDER — APIXABAN 5 MG PO TABS: 5.0000 mg | ORAL_TABLET | Freq: Two times a day (BID) | ORAL | 0 refills | Status: DC

## 2019-05-21 MED ORDER — POTASSIUM CHLORIDE CRYS ER 20 MEQ PO TBCR
40.0000 meq | EXTENDED_RELEASE_TABLET | Freq: Once | ORAL | Status: AC
  Administered 2019-05-21: 40 meq via ORAL
  Filled 2019-05-21: qty 2

## 2019-05-21 MED ORDER — APIXABAN 5 MG PO TABS
5.0000 mg | ORAL_TABLET | Freq: Two times a day (BID) | ORAL | Status: DC
  Administered 2019-05-21: 11:00:00 5 mg via ORAL
  Filled 2019-05-21: qty 1

## 2019-05-21 MED ORDER — DILTIAZEM HCL ER COATED BEADS 180 MG PO CP24: 180.0000 mg | ORAL_CAPSULE | Freq: Every day | ORAL | 0 refills | Status: DC

## 2019-05-21 MED ORDER — DILTIAZEM HCL ER COATED BEADS 180 MG PO CP24
180.0000 mg | ORAL_CAPSULE | Freq: Every day | ORAL | Status: DC
  Administered 2019-05-21: 180 mg via ORAL
  Filled 2019-05-21: qty 1

## 2019-05-21 MED ORDER — AMIODARONE HCL 200 MG PO TABS
400.0000 mg | ORAL_TABLET | Freq: Two times a day (BID) | ORAL | Status: DC
  Administered 2019-05-21: 400 mg via ORAL
  Filled 2019-05-21: qty 2

## 2019-05-21 MED ORDER — AMIODARONE HCL 200 MG PO TABS: 200.0000 mg | ORAL_TABLET | Freq: Two times a day (BID) | ORAL | 0 refills | Status: DC

## 2019-05-21 NOTE — Discharge Instructions (Signed)
Atrial Fibrillation  Atrial fibrillation is a type of heartbeat that is irregular or fast (rapid). If you have this condition, your heart beats without any order. This makes it hard for your heart to pump blood in a normal way. Having this condition gives you more risk for stroke, heart failure, and other heart problems. Atrial fibrillation may start all of a sudden and then stop on its own, or it may become a long-lasting problem. What are the causes? This condition may be caused by heart conditions, such as:  High blood pressure.  Heart failure.  Heart valve disease.  Heart surgery. Other causes include:  Pneumonia.  Obstructive sleep apnea.  Lung cancer.  Thyroid disease.  Drinking too much alcohol. Sometimes the cause is not known. What increases the risk? You are more likely to develop this condition if:  You smoke.  You are older.  You have diabetes.  You are overweight.  You have a family history of this condition.  You exercise often and hard. What are the signs or symptoms? Common symptoms of this condition include:  A feeling like your heart is beating very fast.  Chest pain.  Feeling short of breath.  Feeling light-headed or weak.  Getting tired easily. Follow these instructions at home: Medicines  Take over-the-counter and prescription medicines only as told by your doctor.  If your doctor gives you a blood-thinning medicine, take it exactly as told. Taking too much of it can cause bleeding. Taking too little of it does not protect you against clots. Clots can cause a stroke. Lifestyle      Do not use any tobacco products. These include cigarettes, chewing tobacco, and e-cigarettes. If you need help quitting, ask your doctor.  Do not drink alcohol.  Do not drink beverages that have caffeine. These include coffee, soda, and tea.  Follow diet instructions as told by your doctor.  Exercise regularly as told by your doctor. General  instructions  If you have a condition that causes breathing to stop for a short period of time (apnea), treat it as told by your doctor.  Keep a healthy weight. Do not use diet pills unless your doctor says they are safe for you. Diet pills may make heart problems worse.  Keep all follow-up visits as told by your doctor. This is important. Contact a doctor if:  You notice a change in the speed, rhythm, or strength of your heartbeat.  You are taking a blood-thinning medicine and you see more bruising.  You get tired more easily when you move or exercise.  You have a sudden change in weight. Get help right away if:   You have pain in your chest or your belly (abdomen).  You have trouble breathing.  You have blood in your vomit, poop, or pee (urine).  You have any signs of a stroke. "BE FAST" is an easy way to remember the main warning signs: ? B - Balance. Signs are dizziness, sudden trouble walking, or loss of balance. ? E - Eyes. Signs are trouble seeing or a change in how you see. ? F - Face. Signs are sudden weakness or loss of feeling in the face, or the face or eyelid drooping on one side. ? A - Arms. Signs are weakness or loss of feeling in an arm. This happens suddenly and usually on one side of the body. ? S - Speech. Signs are sudden trouble speaking, slurred speech, or trouble understanding what people say. ? T - Time.  Time to call emergency services. Write down what time symptoms started.  You have other signs of a stroke, such as: ? A sudden, very bad headache with no known cause. ? Feeling sick to your stomach (nausea). ? Throwing up (vomiting). ? Jerky movements you cannot control (seizure). These symptoms may be an emergency. Do not wait to see if the symptoms will go away. Get medical help right away. Call your local emergency services (911 in the [place] Do not drive yourself to the hospital. Summary  Atrial fibrillation is a type of heartbeat that is irregular  or fast (rapid).  You are at higher risk of this condition if you smoke, are older, have diabetes, or are overweight.  Follow your doctor's instructions about medicines, diet, exercise, and follow-up visits.  Get help right away if you think that you have signs of a stroke. This information is not intended to replace advice given to you by your health care provider. Make sure you discuss any questions you have with your health care provider. Document Released: 07/10/2008 Document Revised: 12/05/2017 Document Reviewed: 11/22/2017 Elsevier Patient Education  2020 [employer].   Coronary Artery Disease, Male Coronary artery disease (CAD) is a condition in which the arteries that lead to the heart (coronary arteries) become narrow or blocked. The narrowing or blockage can lead to decreased blood flow to the heart. Prolonged reduced blood flow can cause a heart attack (myocardial infarction or MI). This condition may also be called coronary heart disease. Because CAD is the leading cause of death in men, it is important to understand what causes this condition and how it is treated. What are the causes? CAD is most often caused by atherosclerosis. This is the buildup of fat and cholesterol (plaque) on the inside of the arteries. Over time, the plaque may narrow or block the artery, reducing blood flow to the heart. Plaque can also become weak and break off within a coronary artery and cause a sudden blockage. Other less common causes of CAD include:  A blood clot or a piece of a blood clot or other substance that blocks the flow of blood in a coronary artery (embolism).  A tearing of the artery (spontaneous coronary artery dissection).  An enlargement of an artery (aneurysm).  Inflammation (vasculitis) in the artery wall. What increases the risk? The following factors may make you more likely to develop this condition:  Age. Men over age 52 are at a greater risk of CAD.  Family history of  CAD.  Gender. Men often develop CAD earlier in life than women.  High blood pressure (hypertension).  Diabetes.  High cholesterol levels.  Tobacco use.  Excessive alcohol use.  Lack of exercise.  A diet high in saturated and trans fats, such as fried food and processed meat. Other possible risk factors include:  High stress levels.  Depression.  Obesity.  Sleep apnea. What are the signs or symptoms? Many people do not have any symptoms during the early stages of CAD. As the condition progresses, symptoms may include:  Chest pain (angina). The pain can: ? Feel like crushing or squeezing, or like a tightness, pressure, fullness, or heaviness in the chest. ? Last more than a few minutes or can stop and recur. The pain tends to get worse with exercise or stress and to fade with rest.  Pain in the arms, neck, jaw, ear, or back.  Unexplained heartburn or indigestion.  Shortness of breath.  Nausea or vomiting.  Sudden light-headedness.  Sudden cold sweats.  Fluttering or fast heartbeat (palpitations). How is this diagnosed? This condition is diagnosed based on:  Your family and medical history.  A physical exam.  Tests, including: ? A test to check the electrical signals in your heart (electrocardiogram). ? Exercise stress test. This looks for signs of blockage when the heart is stressed with exercise, such as running on a treadmill. ? Pharmacologic stress test. This test looks for signs of blockage when the heart is being stressed with a medicine. ? Blood tests. ? Coronary angiogram. This is a procedure to look at the coronary arteries to see if there is any blockage. During this test, a dye is injected into your arteries so they appear on an X-ray. ? Coronary artery CT scan. This CT scan helps detect calcium deposits in your coronary arteries. Calcium deposits are an indicator of CAD. ? A test that uses sound waves to take a picture of your heart  (echocardiogram). ? Chest X-ray. How is this treated? This condition may be treated by:  Healthy lifestyle changes to reduce risk factors.  Medicines such as: ? Antiplatelet medicines and blood-thinning medicines, such as aspirin. These help to prevent blood clots. ? Nitroglycerin. ? Blood pressure medicines. ? Cholesterol-lowering medicine.  Coronary angioplasty and stenting. During this procedure, a thin, flexible tube is inserted through a blood vessel and into a blocked artery. A balloon or similar device on the end of the tube is inflated to open up the artery. In some cases, a small, mesh tube (stent) is inserted into the artery to keep it open.  Coronary artery bypass surgery. During this surgery, veins or arteries from other parts of the body are used to create a bypass around the blockage and allow blood to reach your heart. Follow these instructions at home: Medicines  Take over-the-counter and prescription medicines only as told by your health care provider.  Do not take the following medicines unless your health care provider approves: ? NSAIDs, such as ibuprofen, naproxen, or celecoxib. ? Vitamin supplements that contain vitamin A, vitamin E, or both. Lifestyle  Follow an exercise program approved by your health care provider. Aim for 150 minutes of moderate exercise or 75 minutes of vigorous exercise each week.  Maintain a healthy weight or lose weight as approved by your health care provider.  Learn to manage stress or try to limit your stress. Ask your health care provider for suggestions if you need help.  Get screened for depression and seek treatment, if needed.  Do not use any products that contain nicotine or tobacco, such as cigarettes, e-cigarettes, and chewing tobacco. If you need help quitting, ask your health care provider.  Do not use illegal drugs. Eating and drinking   Follow a heart-healthy diet. A dietitian can help educate you about healthy food  options and changes. In general, eat plenty of fruits and vegetables, lean meats, and whole grains.  Avoid foods high in: ? Sugar. ? Salt (sodium). ? Saturated fat, such as processed or fatty meat. ? Trans fat, such as fried foods.  Use healthy cooking methods such as roasting, grilling, broiling, baking, poaching, steaming, or stir-frying.  Do not drink alcohol if your health care provider tells you not to drink.  If you drink alcohol: ? Limit how much you have to 0-2 drinks per day. ? Be aware of how much alcohol is in your drink. In the [place] one drink equals one 12 oz bottle of beer (355 mL), one 5 oz  glass of wine (148 mL), or one 1 oz glass of hard liquor (44 mL). General instructions  Manage any other health conditions, such as hypertension and diabetes. These conditions affect your heart.  Your health care provider may ask you to monitor your blood pressure. Ideally, your blood pressure should be below 130/80.  Keep all follow-up visits as told by your health care provider. This is important. Get help right away if:  You have pain in your chest, neck, ear, arm, jaw, stomach, or back that: ? Lasts more than a few minutes. ? Is recurring. ? Is not relieved by taking medicine under your tongue (sublingual nitroglycerin).  You have profuse sweating without cause.  You have unexplained: ? Heartburn or indigestion. ? Shortness of breath or difficulty breathing. ? Fluttering or fast heartbeat (palpitations). ? Nausea or vomiting. ? Fatigue. ? Feelings of nervousness or anxiety. ? Weakness. ? Diarrhea.  You have sudden light-headedness or dizziness.  You faint.  You feel like hurting yourself or think about taking your own life. These symptoms may represent a serious problem that is an emergency. Do not wait to see if the symptoms will go away. Get medical help right away. Call your local emergency services (911 in the [place] Do not drive yourself to the  hospital. Summary  Coronary artery disease (CAD) is a condition in which the arteries that lead to the heart (coronary arteries) become narrow or blocked. The narrowing or blockage can lead to a heart attack.  Many people do not have any symptoms during the early stages of CAD.  CAD can be treated with lifestyle changes, medicines, surgery, or a combination of these treatments. This information is not intended to replace advice given to you by your health care provider. Make sure you discuss any questions you have with your health care provider. Document Released: 04/28/2014 Document Revised: 06/20/2018 Document Reviewed: 06/10/2018 Elsevier Patient Education  2020 [employer].

## 2019-05-21 NOTE — Discharge Summary (Addendum)
[place] [facility] [place] NAME: Scott Gross    MR#:  [id number]  DATE OF BIRTH:  January 30, 1934  DATE OF ADMISSION:  05/20/2019 ADMITTING PHYSICIAN: Saundra Shelling, MD  DATE OF DISCHARGE: 05/21/2019  PRIMARY CARE PHYSICIAN: [name], MD    ADMISSION DIAGNOSIS:  Atrial fibrillation with RVR ([place]) [I48.91] Cancer of upper lobe of left lung (HCC) [C34.12] Coronary artery disease involving native heart without angina pectoris, unspecified vessel or lesion type [I25.10]  DISCHARGE DIAGNOSIS:  Active Problems:   A-fib (Scott Gross)   Atrial fibrillation with RVR (Scott Gross)   SECONDARY DIAGNOSIS:   Past Medical History:  Diagnosis Date  . Arthritis   . CAD (coronary artery disease) Nov. 12, 2012   Inferior ST elevation MI in November of 2012 with ventricular fibrillation arrest. Status post drug-eluting stent placement to the mid LAD. Most recent cardiac catheterization in June of this year showed patent stent with minimal restenosis, 75% proximal LAD and 80% distal LAD which were unchanged from before. Mild disease in the left circumflex and moderate RCA disease. Ejection fraction was 55%.  . Cancer of upper lobe of left lung ([place]) 11/2016   Rad tx's.  . Cardiac arrest [facility]) Nov. 2012   x 2   . Cardiac arrest - ventricular fibrillation   . HOH (hard of hearing)    Left Hearing Aid  . Hyperlipidemia   . Hypertension   . Post PTCA 2013   x2  . ST elevation MI (STEMI) ([place]) 08/26/2011    HOSPITAL COURSE:   85 year old male with CAD status post STEMI in 4098 complicated by ventricular fibrillation and hypertension presented to the emergency room due to weakness and nausea and found to have new onset atrial fibrillation.   1.  New onset atrial fibrillation with RVR: Patient had attempted cardioversion while in the emergency room which was unsuccessful.  He was loaded with IV amiodarone and spontaneously converted to normal sinus rhythm.  He has been in  normal sinus rhythm since then.  He is currently on amiodarone 200 mg p.o. twice daily along with Cardizem 180 mg daily.  He has been consented for anticoagulation with side effects, alternatives and risks discussed with the patient. Patient will have outpatient follow-up tomorrow with Dr. Clayborn Bigness  Echocardiogram will be performed at this time.  Plan of care as mentioned above was discussed with cardiology prior to discharge.  2.  Hypokalemia: This was repleted  3.  Essential hypertension: Patient is now on Cardizem 180 mg daily.  He will need outpatient follow-up for his blood pressure in addition to his coronary artery disease.  4.  CAD: Since he is on Eliquis aspirin has been discontinued. Management as per outpatient cardiology DISCHARGE CONDITIONS AND DIET:   Stable for discharge heart healthy diet  CONSULTS OBTAINED:  Treatment Team:  Teodoro Spray, MD  DRUG ALLERGIES:   Allergies  Allergen Reactions  . Statins Other (See Comments)    Arthralagia    DISCHARGE MEDICATIONS:   Allergies as of 05/21/2019      Reactions   Statins Other (See Comments)   Arthralagia      Medication List    STOP taking these medications   amLODipine 5 MG tablet Commonly known as: NORVASC   aspirin 81 MG tablet   labetalol 100 MG tablet Commonly known as: NORMODYNE   lisinopril-hydrochlorothiazide 20-25 MG tablet Commonly known as: ZESTORETIC     TAKE these medications   amiodarone 200 MG tablet  Commonly known as: PACERONE Take 1 tablet (200 mg total) by mouth 2 (two) times daily.   apixaban 5 MG Tabs tablet Commonly known as: ELIQUIS Take 1 tablet (5 mg total) by mouth 2 (two) times daily.   diltiazem 180 MG 24 hr capsule Commonly known as: CARDIZEM CD Take 1 capsule (180 mg total) by mouth daily.   furosemide 20 MG tablet Commonly known as: LASIX Take 20 mg by mouth daily. As needed for edema         Today   CHIEF COMPLAINT:  Patient is doing well and wants to  go home by lunchtime no chest pain or shortness of breath.  Patient is in normal sinus rhythm.   VITAL SIGNS:  Blood pressure 133/86, pulse 85, temperature 98.4 F (36.9 C), temperature source Oral, resp. rate 18, height 5\' 8"  (1.727 m), weight 65.5 kg, SpO2 98 %.   REVIEW OF SYSTEMS:  Review of Systems  Constitutional: Negative.  Negative for chills, fever and malaise/fatigue.  HENT: Negative.  Negative for ear discharge, ear pain, hearing loss, nosebleeds and sore throat.   Eyes: Negative.  Negative for blurred vision and pain.  Respiratory: Negative.  Negative for cough, hemoptysis, shortness of breath and wheezing.   Cardiovascular: Negative.  Negative for chest pain, palpitations and leg swelling.  Gastrointestinal: Negative.  Negative for abdominal pain, blood in stool, diarrhea, nausea and vomiting.  Genitourinary: Negative.  Negative for dysuria.  Musculoskeletal: Negative.  Negative for back pain.  Skin: Negative.   Neurological: Negative for dizziness, tremors, speech change, focal weakness, seizures and headaches.  Endo/Heme/Allergies: Negative.  Does not bruise/bleed easily.  Psychiatric/Behavioral: Negative.  Negative for depression, hallucinations and suicidal ideas.     PHYSICAL EXAMINATION:  GENERAL:  85 y.o.-year-old patient lying in the bed with no acute distress.  NECK:  Supple, no jugular venous distention. No thyroid enlargement, no tenderness.  LUNGS: Normal breath sounds bilaterally, no wheezing, rales,rhonchi  No use of accessory muscles of respiration.  CARDIOVASCULAR: S1, S2 normal. No murmurs, rubs, or gallops.  ABDOMEN: Soft, non-tender, non-distended. Bowel sounds present. No organomegaly or mass.  EXTREMITIES: No pedal edema, cyanosis, or clubbing.  PSYCHIATRIC: The patient is alert and oriented x 3.  SKIN: No obvious rash, lesion, or ulcer.   DATA REVIEW:   CBC Recent Labs  Lab 05/21/19 0444  WBC 10.0  HGB 12.3*  HCT 35.4*  PLT 272     Chemistries  Recent Labs  Lab 05/20/19 1154 05/21/19 0444  NA 130* 133*  K 3.8 3.2*  CL 92* 101  CO2 24 25  GLUCOSE 179* 130*  BUN 13 8  CREATININE 1.22 0.61  CALCIUM 8.8* 7.9*  MG 1.8 1.8  AST 18  --   ALT 13  --   ALKPHOS 91  --   BILITOT 1.4*  --     Cardiac Enzymes No results for input(s): TROPONINI in the last 168 hours.  Microbiology Results  @[email or web address] @  RADIOLOGY:  Dg Chest Portable 1 View  Result Date: 05/20/2019 CLINICAL DATA:  Atrial fibrillation.  Low blood pressure. EXAM: PORTABLE CHEST 1 VIEW COMPARISON:  CT 04/21/2019.  Chest x-ray 02/10/2018. FINDINGS: Patient is rotated to the right. Upper mediastinal evaluation is difficult due to patient rotation. Prominent left upper lobe mass again noted. Radiation seed clips are again noted within the mass. Similar findings noted on prior CT. No focal alveolar infiltrate. No pleural effusion or pneumothorax. Heart size normal. IMPRESSION: Prominent left upper lobe mass again noted.  Radiation seed clips again noted within the mass. Similar appearance noted on prior CT of 04/21/2019. Electronically Signed   By: Marcello Moores  Register   On: 05/20/2019 12:28      Allergies as of 05/21/2019      Reactions   Statins Other (See Comments)   Arthralagia      Medication List    STOP taking these medications   amLODipine 5 MG tablet Commonly known as: NORVASC   aspirin 81 MG tablet   labetalol 100 MG tablet Commonly known as: NORMODYNE   lisinopril-hydrochlorothiazide 20-25 MG tablet Commonly known as: ZESTORETIC     TAKE these medications   amiodarone 200 MG tablet Commonly known as: PACERONE Take 1 tablet (200 mg total) by mouth 2 (two) times daily.   apixaban 5 MG Tabs tablet Commonly known as: ELIQUIS Take 1 tablet (5 mg total) by mouth 2 (two) times daily.   diltiazem 180 MG 24 hr capsule Commonly known as: CARDIZEM CD Take 1 capsule (180 mg total) by mouth daily.   furosemide 20 MG tablet Commonly  known as: LASIX Take 20 mg by mouth daily. As needed for edema        Management plans discussed with the patient and he is in agreement. Stable for discharge   Patient should follow up with cardiology tomorrow  CODE STATUS:     Code Status Orders  (From admission, onward)         Start     Ordered   05/20/19 1450  Full code  Continuous     05/20/19 1449        Code Status History    This patient has a current code status but no historical code status.   Advance Care Planning Activity      TOTAL TIME TAKING CARE OF THIS PATIENT: 39 minutes.    Note: This dictation was prepared with Dragon dictation along with smaller phrase technology. Any transcriptional errors that result from this process are unintentional.  Bettey Costa M.D on 05/21/2019 at 11:14 AM  Between 7am to 6pm - Pager - [phone] After 6pm go to [email or web address] - password [email or web address] [place] [facility]  Office  [phone]  CC: Primary care physician; [name], MD

## 2019-05-21 NOTE — Progress Notes (Signed)
[place] for apixaban Indication: atrial fibrillation  Allergies  Allergen Reactions  . Statins Other (See Comments)    Arthralagia    Patient Measurements: Height: 5\' 8"  (172.7 cm) Weight: 144 lb 6.4 oz (65.5 kg) IBW/kg (Calculated) : 68.4  Vital Signs: Temp: 98.4 F (36.9 C) (08/06 0800) Temp Source: Oral (08/06 0800) BP: 133/86 (08/06 0800) Pulse Rate: 85 (08/06 0800)  Labs: Recent Labs    05/18/19 1413 05/20/19 1154 05/20/19 1516 05/20/19 2206 05/21/19 0444  HGB 13.6 14.3  --   --  12.3*  HCT 40.7 43.0  --   --  35.4*  PLT 317 345  --   --  272  APTT  --  33  --   --   --   LABPROT  --  14.3  --   --   --   INR  --  1.1  --   --   --   HEPARINUNFRC  --   --   --  <0.10* 0.31  CREATININE 0.92 1.22  --   --  0.61  TROPONINIHS  --  10 63*  --   --     Estimated Creatinine Clearance: 66 mL/min (by C-G formula based on SCr of 0.61 mg/dL).   Medical History: Past Medical History:  Diagnosis Date  . Arthritis   . CAD (coronary artery disease) Nov. 12, 2012   Inferior ST elevation MI in November of 2012 with ventricular fibrillation arrest. Status post drug-eluting stent placement to the mid LAD. Most recent cardiac catheterization in June of this year showed patent stent with minimal restenosis, 75% proximal LAD and 80% distal LAD which were unchanged from before. Mild disease in the left circumflex and moderate RCA disease. Ejection fraction was 55%.  . Cancer of upper lobe of left lung ([place]) 11/2016   Rad tx's.  . Cardiac arrest [facility]) Nov. 2012   x 2   . Cardiac arrest - ventricular fibrillation   . HOH (hard of hearing)    Left Hearing Aid  . Hyperlipidemia   . Hypertension   . Post PTCA 2013   x2  . ST elevation MI (STEMI) ([place]) 08/26/2011     Assessment: 85 year old male admitted with afib. No prior anticoagulant use. Has been on a heparin drip and is now starting apixaban.  Goal of Therapy:  Monitor  platelets by anticoagulation protocol: Yes   Plan:  Will order apixaban 5 mg BID. Patient and his spouse were counseled about the medication, side effects and dosing. Patient and spouse verbalized understanding and handout was left with patient.  Tawnya Crook, PharmD 05/21/2019,11:15 AM

## 2019-05-21 NOTE — Progress Notes (Signed)
Subjective:  Patient states to be doing reasonably well denies any palpitations or tachycardia no chest pain his energy is better and his blood pressure is better feels well enough to go home  Objective:  Vital Signs in the last 24 hours: Temp:  [97.4 F (36.3 C)-98.6 F (37 C)] 98.4 F (36.9 C) (08/06 0800) Pulse Rate:  [69-86] 85 (08/06 0800) Resp:  [8-24] 18 (08/06 0800) BP: (99-137)/(65-86) 133/86 (08/06 0800) SpO2:  [95 %-100 %] 98 % (08/06 0800) Weight:  [65.3 kg-65.5 kg] 65.5 kg (08/05 1845)  Intake/Output from previous day: 08/05 0701 - 08/06 0700 In: 1760.2 [I.V.:1760.2] Out: 1500 [Urine:1500] Intake/Output from this shift: Total I/O In: 444.4 [P.O.:240; I.V.:204.4] Out: -   Physical Exam: General appearance: appears stated age Neck: no adenopathy, no carotid bruit, no JVD, supple, symmetrical, trachea midline and thyroid not enlarged, symmetric, no tenderness/mass/nodules Lungs: diminished breath sounds LUL, dullness to percussion LUL and rhonchi LUL and posterior - left Heart: regular rate and rhythm, S1, S2 normal, no murmur, click, rub or gallop Abdomen: soft, non-tender; bowel sounds normal; no masses,  no organomegaly Extremities: extremities normal, atraumatic, no cyanosis or edema Pulses: 2+ and symmetric Skin: Skin color, texture, turgor normal. No rashes or lesions Neurologic: Alert and oriented X 3, normal strength and tone. Normal symmetric reflexes. Normal coordination and gait  Lab Results: Recent Labs    05/20/19 1154 05/21/19 0444  WBC 13.0* 10.0  HGB 14.3 12.3*  PLT 345 272   Recent Labs    05/20/19 1154 05/21/19 0444  NA 130* 133*  K 3.8 3.2*  CL 92* 101  CO2 24 25  GLUCOSE 179* 130*  BUN 13 8  CREATININE 1.22 0.61   No results for input(s): TROPONINI in the last 72 hours.  Invalid input(s): CK, MB Hepatic Function Panel Recent Labs    05/20/19 1154  PROT 7.2  ALBUMIN 3.7  AST 18  ALT 13  ALKPHOS 91  BILITOT 1.4*    Recent Labs    05/21/19 0444  CHOL 110   No results for input(s): PROTIME in the last 72 hours.  Imaging: Imaging results have been reviewed  Cardiac Studies:  Assessment/Plan:  Arrhythmia Atrial Fibrillation Chest Pain Coronary Artery Disease Coronary Artery Stent Palpitations Shortness of Breath  Mild hypotension . Plan Recommend anticoagulation with Eliquis discontinue heparin Continue amiodarone load orally 200 mg twice a day for about 2 weeks then switch to once a day Low-dose metoprolol for rate control and hypertension Overall diltiazem moderate dose for rate control and blood pressure discontinue amlodipine Recommend the patient follow-up with oncology radiation next week for possible continued therapy Recommend discharge later today follow-up with cardiology 1 to 2 weeks  LOS: 1 day     D  05/21/2019, 12:10 PM

## 2019-05-21 NOTE — Progress Notes (Signed)
[facility] Cardiology  SUBJECTIVE: [name] is an 85 year old male with a past medical history significant for coronary artery disease s/p STEMI in 2012 with PCI to mid LAD, complicated by ventricular fibrillation, left upper lobe cancer, hypertension, and hyperlipidemia who presented to the ED on 05/20/19 with complaints of weakness and nausea.  In the ED, he was noted to be in new onset atrial fibrillation with rapid ventricular response, and was started on IV amiodarone with attempted cardioversion.  Cardioversion was unsuccessful, and he he was admitted to the ICU for further evaluation and treatment.  He has since converted and is currently in normal sinus rhythm, rate controlled.   Today, [name] reports that his symptoms have significantly improved.  He denies chest pain, palpitations, shortness of breath, lower extremity swelling, orthopnea, PND, dizziness, lightheadedness, or syncopal/presyncopal episodes.    He is followed in outpatient cardiology by Dr. Clayborn Bigness.  Stress test in 2018 revealed Most recent cardiac catheterization revealed no evidence of stress induced ischemia. Echocardiogram revealed normal LV function with an EF estimated between 50-55% with mild MR, mild AR, mild TR. No evidence of valvular stenosis.  Most recent catheterization revealed a patent stent with a 75% proximal LAD, 80% distal LAD, with mild left circumflex and moderate RCA disease.     Vitals:   05/21/19 0500 05/21/19 0600 05/21/19 0700 05/21/19 0800  BP: 137/85 122/82 130/78 133/86  Pulse: 77 78 78 85  Resp: 11 11 19 18   Temp:    98.4 F (36.9 C)  TempSrc:    Oral  SpO2: 97% 95% 96% 98%  Weight:      Height:         Intake/Output Summary (Last 24 hours) at 05/21/2019 0850 Last data filed at 05/21/2019 0800 Gross per 24 hour  Intake 1964.55 ml  Output 1500 ml  Net 464.55 ml      PHYSICAL EXAM  General: Well developed, well nourished, in no acute distress HEENT:  Normocephalic and atramatic Neck:   No JVD.  Lungs: Clear bilaterally to auscultation and percussion. Heart: HRRR . Normal S1 and S2 without gallops or murmurs.  Abdomen: Bowel sounds are positive, abdomen soft and non-tender  Msk:  Back normal.  Normal strength and tone for age. Extremities: No clubbing, cyanosis or edema.   Neuro: Alert and oriented X 3. Psych:  Good affect, responds appropriately   LABS: Basic Metabolic Panel: Recent Labs    05/18/19 1413 05/20/19 1154 05/21/19 0444  NA 133* 130* 133*  K 3.9 3.8 3.2*  CL 95* 92* 101  CO2 28 24 25   GLUCOSE 116* 179* 130*  BUN 12 13 8   CREATININE 0.92 1.22 0.61  CALCIUM 8.9 8.8* 7.9*  MG 2.1 1.8  --    Liver Function Tests: Recent Labs    05/18/19 1413 05/20/19 1154  AST 13* 18  ALT 10 13  ALKPHOS 84 91  BILITOT 0.7 1.4*  PROT 6.5 7.2  ALBUMIN 3.4* 3.7   No results for input(s): LIPASE, AMYLASE in the last 72 hours. CBC: Recent Labs    05/18/19 1413 05/20/19 1154 05/21/19 0444  WBC 8.4 13.0* 10.0  NEUTROABS 5.7 10.2*  --   HGB 13.6 14.3 12.3*  HCT 40.7 43.0 35.4*  MCV 86.4 87.8 85.1  PLT 317 345 272   Cardiac Enzymes: No results for input(s): CKTOTAL, CKMB, CKMBINDEX, TROPONINI in the last 72 hours. BNP: Invalid input(s): POCBNP D-Dimer: No results for input(s): DDIMER in the last 72 hours. Hemoglobin  A1C: No results for input(s): HGBA1C in the last 72 hours. Fasting Lipid Panel: Recent Labs    05/21/19 0444  CHOL 110  HDL 31*  LDLCALC 66  TRIG 63  CHOLHDL 3.5   Thyroid Function Tests: Recent Labs    05/20/19 1154  TSH 0.975   Anemia Panel: No results for input(s): VITAMINB12, FOLATE, FERRITIN, TIBC, IRON, RETICCTPCT in the last 72 hours.  Dg Chest Portable 1 View  Result Date: 05/20/2019 CLINICAL DATA:  Atrial fibrillation.  Low blood pressure. EXAM: PORTABLE CHEST 1 VIEW COMPARISON:  CT 04/21/2019.  Chest x-ray 02/10/2018. FINDINGS: Patient is rotated to the right. Upper mediastinal evaluation is difficult due to  patient rotation. Prominent left upper lobe mass again noted. Radiation seed clips are again noted within the mass. Similar findings noted on prior CT. No focal alveolar infiltrate. No pleural effusion or pneumothorax. Heart size normal. IMPRESSION: Prominent left upper lobe mass again noted. Radiation seed clips again noted within the mass. Similar appearance noted on prior CT of 04/21/2019. Electronically Signed   By: Marcello Moores  Register   On: 05/20/2019 12:28     Echo: Pending   TELEMETRY:   ASSESSMENT AND PLAN:  Active Problems:   A-fib ([place])   Atrial fibrillation with RVR ([place])    1.  Atrial fibrillation with rapid ventricular response   -Currently in sinus rhythm, rate controlled   -Will convert to oral amiodarone and load with 400mg  BID for 1 week, 200mg  BID for 3 weeks, and 200mg  daily thereafter   -Will start oral diltiazem 180mg  daily   -With CHADsVASc score of at least 4, will need to discharge home on Eliquis 5mg  BID  -Recommend follow up with Dr. Clayborn Bigness in an outpatient setting within 1 week of discharge   2.  Hypokalemia (3.2)  -Continue potassium supplementation     The history, physical exam findings, and plan of care were all discussed with Dr. Bartholome Bill, and all decision making was made in collaboration.   Avie Arenas  PA-C 05/21/2019 8:50 AM

## 2019-05-21 NOTE — Consult Note (Signed)
ANTICOAGULATION CONSULT NOTE - follow up  Pharmacy Consult for Heparin Dosing Indication: atrial fibrillation  Allergies  Allergen Reactions  . Statins Other (See Comments)    Arthralagia   Patient Measurements: Height: 5\' 8"  (172.7 cm) Weight: 144 lb 6.4 oz (65.5 kg) IBW/kg (Calculated) : 68.4 Heparin Dosing Weight: 65.3 kg  Vital Signs: Temp: 98.6 F (37 C) (08/06 0200) Temp Source: Oral (08/06 0200) BP: 137/85 (08/06 0500) Pulse Rate: 77 (08/06 0500)  Labs: Recent Labs    05/18/19 1413 05/20/19 1154 05/20/19 1516 05/20/19 2206 05/21/19 0444  HGB 13.6 14.3  --   --  12.3*  HCT 40.7 43.0  --   --  35.4*  PLT 317 345  --   --  272  APTT  --  33  --   --   --   LABPROT  --  14.3  --   --   --   INR  --  1.1  --   --   --   HEPARINUNFRC  --   --   --  <0.10* 0.31  CREATININE 0.92 1.22  --   --  0.61  TROPONINIHS  --  10 63*  --   --     Estimated Creatinine Clearance: 66 mL/min (by C-G formula based on SCr of 0.61 mg/dL).   Medical History: Past Medical History:  Diagnosis Date  . Arthritis   . CAD (coronary artery disease) Nov. 12, 2012   Inferior ST elevation MI in November of 2012 with ventricular fibrillation arrest. Status post drug-eluting stent placement to the mid LAD. Most recent cardiac catheterization in June of this year showed patent stent with minimal restenosis, 75% proximal LAD and 80% distal LAD which were unchanged from before. Mild disease in the left circumflex and moderate RCA disease. Ejection fraction was 55%.  . Cancer of upper lobe of left lung ([place]) 11/2016   Rad tx's.  . Cardiac arrest [facility]) Nov. 2012   x 2   . Cardiac arrest - ventricular fibrillation   . HOH (hard of hearing)    Left Hearing Aid  . Hyperlipidemia   . Hypertension   . Post PTCA 2013   x2  . ST elevation MI (STEMI) ([place]) 08/26/2011    Medications:  Medications Prior to Admission  Medication Sig Dispense Refill Last Dose  . aspirin 81 MG tablet Take 81 mg by  mouth daily. Patient stopped on November 22, 2016 for procedure   05/20/2019 at 0700  . furosemide (LASIX) 20 MG tablet Take 20 mg by mouth daily. As needed for edema   05/19/2019 at 0700  . labetalol (NORMODYNE) 100 MG tablet Take 100 mg by mouth once.    05/20/2019 at 0700  . lisinopril-hydrochlorothiazide (PRINZIDE,ZESTORETIC) 20-25 MG tablet Take 1 tablet by mouth 2 (two) times daily.   05/20/2019 at 0700  . amLODipine (NORVASC) 5 MG tablet Take 5 mg by mouth as needed. For systolic pressure greater than 150   prn at prn   Scheduled:  . aspirin EC  81 mg Oral Daily   Infusions:  . sodium chloride 75 mL/hr at 05/21/19 0400  . amiodarone 30 mg/hr (05/21/19 0542)  . diltiazem (CARDIZEM) infusion 1 mg/hr (05/21/19 0000)  . heparin 1,050 Units/hr (05/21/19 0400)  . lactated ringers     PRN:  Anti-infectives (From admission, onward)   None     Assessment: Pharmacy has been consulted to initiate Heparin Dosing on 85yo patient complaining of chest pain. Patient  has no prior history of anticoagulant use. Baseline labs have been ordered and results are pending.  08/05 @ 2206 HL < 0.10 08/06 @ 0444 HL 0.31, therapeutic x 1  Goal of Therapy:  Heparin level 0.3-0.7 units/ml Monitor platelets by anticoagulation protocol: Yes   Plan:  Continue heparin infusion at 1050 units/hr Check anti-Xa level in 6 hours to confirm and daily while on heparin Continue to monitor H&H and platelets  [place] Crane, Robbyn Hodkinson A 05/21/2019,6:03 AM

## 2019-05-21 NOTE — TOC Transition Note (Signed)
Transition of Care [facility]) - CM/SW Discharge Note   Patient Details  Name: Scott Gross MRN: [id number] Date of Birth: 1934-10-09  [facility] of [facility] CM/SW Contact:  Candie Chroman, LCSW Phone Number: 05/21/2019, 2:20 PM   Clinical Narrative:  CSW met with patient, introduced role, and explained that discharge planning would be discussed. He has an order for a home health RN but patient does not feel that is necessary. His two daughters are nurses and live in [place]. Both visit him daily. He does not use any DME at home. CSW provided Eliquis card. No further concerns. Patient has orders to discharge home today. His wife is here to transport him home. No further concerns. CSW signing off.       Final next level of care: Home/Self Care Barriers to Discharge: Barriers Resolved   Patient Goals and CMS Choice     Choice offered to / list presented to : NA  Discharge Placement                       Discharge Plan and Services     Post Acute Care Choice: NA                               Social Determinants of Health (SDOH) Interventions     Readmission Risk Interventions No flowsheet data found.

## 2019-05-22 ENCOUNTER — Ambulatory Visit: Payer: Medicare HMO

## 2019-05-23 ENCOUNTER — Other Ambulatory Visit: Payer: Self-pay

## 2019-05-23 ENCOUNTER — Inpatient Hospital Stay
Admission: EM | Admit: 2019-05-23 | Discharge: 2019-05-25 | DRG: 864 | Disposition: A | Discharge status: Home / Self Care | Payer: Medicare HMO | Attending: Internal Medicine | Admitting: Internal Medicine

## 2019-05-23 ENCOUNTER — Emergency Department: Payer: Medicare HMO

## 2019-05-23 DIAGNOSIS — I480 Paroxysmal atrial fibrillation: Secondary | ICD-10-CM | POA: Diagnosis not present

## 2019-05-23 DIAGNOSIS — E876 Hypokalemia: Secondary | ICD-10-CM | POA: Diagnosis present

## 2019-05-23 DIAGNOSIS — R6510 Systemic inflammatory response syndrome (SIRS) of non-infectious origin without acute organ dysfunction: Secondary | ICD-10-CM | POA: Diagnosis present

## 2019-05-23 DIAGNOSIS — E119 Type 2 diabetes mellitus without complications: Secondary | ICD-10-CM | POA: Diagnosis present

## 2019-05-23 DIAGNOSIS — Z8249 Family history of ischemic heart disease and other diseases of the circulatory system: Secondary | ICD-10-CM

## 2019-05-23 DIAGNOSIS — I10 Essential (primary) hypertension: Secondary | ICD-10-CM | POA: Diagnosis present

## 2019-05-23 DIAGNOSIS — I252 Old myocardial infarction: Secondary | ICD-10-CM | POA: Diagnosis not present

## 2019-05-23 DIAGNOSIS — A419 Sepsis, unspecified organism: Secondary | ICD-10-CM

## 2019-05-23 DIAGNOSIS — Z7901 Long term (current) use of anticoagulants: Secondary | ICD-10-CM

## 2019-05-23 DIAGNOSIS — I2510 Atherosclerotic heart disease of native coronary artery without angina pectoris: Secondary | ICD-10-CM | POA: Diagnosis present

## 2019-05-23 DIAGNOSIS — R918 Other nonspecific abnormal finding of lung field: Secondary | ICD-10-CM | POA: Diagnosis not present

## 2019-05-23 DIAGNOSIS — R509 Fever, unspecified: Principal | ICD-10-CM | POA: Diagnosis present

## 2019-05-23 DIAGNOSIS — E785 Hyperlipidemia, unspecified: Secondary | ICD-10-CM | POA: Diagnosis present

## 2019-05-23 DIAGNOSIS — H9190 Unspecified hearing loss, unspecified ear: Secondary | ICD-10-CM | POA: Diagnosis present

## 2019-05-23 DIAGNOSIS — Z923 Personal history of irradiation: Secondary | ICD-10-CM

## 2019-05-23 DIAGNOSIS — C3412 Malignant neoplasm of upper lobe, left bronchus or lung: Secondary | ICD-10-CM | POA: Diagnosis present

## 2019-05-23 DIAGNOSIS — Z955 Presence of coronary angioplasty implant and graft: Secondary | ICD-10-CM | POA: Diagnosis not present

## 2019-05-23 DIAGNOSIS — R5381 Other malaise: Secondary | ICD-10-CM | POA: Diagnosis not present

## 2019-05-23 DIAGNOSIS — Z8674 Personal history of sudden cardiac arrest: Secondary | ICD-10-CM

## 2019-05-23 DIAGNOSIS — Z20828 Contact with and (suspected) exposure to other viral communicable diseases: Secondary | ICD-10-CM | POA: Diagnosis present

## 2019-05-23 DIAGNOSIS — Z87891 Personal history of nicotine dependence: Secondary | ICD-10-CM

## 2019-05-23 DIAGNOSIS — Z79899 Other long term (current) drug therapy: Secondary | ICD-10-CM | POA: Diagnosis not present

## 2019-05-23 DIAGNOSIS — Z974 Presence of external hearing-aid: Secondary | ICD-10-CM

## 2019-05-23 LAB — COMPREHENSIVE METABOLIC PANEL
ALT: 14 U/L (ref 0–44)
AST: 17 U/L (ref 15–41)
Albumin: 3 g/dL — ABNORMAL LOW (ref 3.5–5.0)
Alkaline Phosphatase: 78 U/L (ref 38–126)
Anion gap: 10 (ref 5–15)
BUN: 10 mg/dL (ref 8–23)
CO2: 24 mmol/L (ref 22–32)
Calcium: 7.9 mg/dL — ABNORMAL LOW (ref 8.9–10.3)
Chloride: 92 mmol/L — ABNORMAL LOW (ref 98–111)
Creatinine, Ser: 0.71 mg/dL (ref 0.61–1.24)
GFR calc Af Amer: >60 mL/min (ref >60)
GFR calc non Af Amer: >60 mL/min (ref >60)
Glucose, Bld: 209 mg/dL — ABNORMAL HIGH (ref 70–99)
Potassium: 3.2 mmol/L — ABNORMAL LOW (ref 3.5–5.1)
Sodium: 126 mmol/L — ABNORMAL LOW (ref 135–145)
Total Bilirubin: 1.1 mg/dL (ref 0.3–1.2)
Total Protein: 6.2 g/dL — ABNORMAL LOW (ref 6.5–8.1)

## 2019-05-23 LAB — CBC WITH DIFFERENTIAL/PLATELET
Abs Immature Granulocytes: 0.09 10*3/uL — ABNORMAL HIGH (ref 0.00–0.07)
Basophils Absolute: 0 10*3/uL (ref 0.0–0.1)
Basophils Relative: 0 %
Eosinophils Absolute: 0 10*3/uL (ref 0.0–0.5)
Eosinophils Relative: 0 %
HCT: 35 % — ABNORMAL LOW (ref 39.0–52.0)
Hemoglobin: 12.1 g/dL — ABNORMAL LOW (ref 13.0–17.0)
Immature Granulocytes: 1 %
Lymphocytes Relative: 4 %
Lymphs Abs: 0.7 10*3/uL (ref 0.7–4.0)
MCH: 29.3 pg (ref 26.0–34.0)
MCHC: 34.6 g/dL (ref 30.0–36.0)
MCV: 84.7 fL (ref 80.0–100.0)
Monocytes Absolute: 1.2 10*3/uL — ABNORMAL HIGH (ref 0.1–1.0)
Monocytes Relative: 8 %
Neutro Abs: 13.7 10*3/uL — ABNORMAL HIGH (ref 1.7–7.7)
Neutrophils Relative %: 87 %
Platelets: 244 10*3/uL (ref 150–400)
RBC: 4.13 MIL/uL — ABNORMAL LOW (ref 4.22–5.81)
RDW: 12.6 % (ref 11.5–15.5)
WBC: 15.7 10*3/uL — ABNORMAL HIGH (ref 4.0–10.5)
nRBC: 0 % (ref 0.0–0.2)

## 2019-05-23 LAB — URINALYSIS, COMPLETE (UACMP) WITH MICROSCOPIC
Bilirubin Urine: NEGATIVE
Glucose, UA: 150 mg/dL — AB
Hgb urine dipstick: NEGATIVE
Ketones, ur: NEGATIVE mg/dL
Leukocytes,Ua: NEGATIVE
Nitrite: NEGATIVE
Protein, ur: 30 mg/dL — AB
Specific Gravity, Urine: 1.015 (ref 1.005–1.030)
Squamous Epithelial / HPF: NONE SEEN (ref 0–5)
pH: 8 (ref 5.0–8.0)

## 2019-05-23 LAB — APTT: aPTT: 37 seconds — ABNORMAL HIGH (ref 24–36)

## 2019-05-23 LAB — SARS CORONAVIRUS 2 BY RT PCR (HOSPITAL ORDER, PERFORMED IN ~~LOC~~ HOSPITAL LAB): SARS Coronavirus 2: NEGATIVE

## 2019-05-23 LAB — PROTIME-INR
INR: 1.4 — ABNORMAL HIGH (ref 0.8–1.2)
Prothrombin Time: 17 seconds — ABNORMAL HIGH (ref 11.4–15.2)

## 2019-05-23 LAB — LACTIC ACID, PLASMA
Lactic Acid, Venous: 1.8 mmol/L (ref 0.5–1.9)
Lactic Acid, Venous: 1.5 mmol/L (ref 0.5–1.9)

## 2019-05-23 MED ORDER — DILTIAZEM HCL ER COATED BEADS 180 MG PO CP24
180.0000 mg | ORAL_CAPSULE | Freq: Every day | ORAL | Status: DC
  Administered 2019-05-24 – 2019-05-25 (×2): 180 mg via ORAL
  Filled 2019-05-23 (×2): qty 1

## 2019-05-23 MED ORDER — SODIUM CHLORIDE 0.9 % IV SOLN
2.0000 g | Freq: Once | INTRAVENOUS | Status: AC
  Administered 2019-05-23: 2 g via INTRAVENOUS
  Filled 2019-05-23: qty 2

## 2019-05-23 MED ORDER — ONDANSETRON HCL 4 MG PO TABS: 4.0000 mg | ORAL_TABLET | Freq: Four times a day (QID) | ORAL | Status: DC | PRN

## 2019-05-23 MED ORDER — METRONIDAZOLE IN NACL 5-0.79 MG/ML-% IV SOLN
500.0000 mg | Freq: Once | INTRAVENOUS | Status: AC
  Administered 2019-05-24: 500 mg via INTRAVENOUS
  Filled 2019-05-23 (×2): qty 100

## 2019-05-23 MED ORDER — VANCOMYCIN HCL IN DEXTROSE 1-5 GM/200ML-% IV SOLN
1000.0000 mg | Freq: Once | INTRAVENOUS | Status: AC
  Administered 2019-05-23: 22:00:00 1000 mg via INTRAVENOUS
  Filled 2019-05-23: qty 200

## 2019-05-23 MED ORDER — ACETAMINOPHEN 325 MG PO TABS
650.0000 mg | ORAL_TABLET | Freq: Four times a day (QID) | ORAL | Status: DC | PRN
  Administered 2019-05-24 (×2): 650 mg via ORAL
  Filled 2019-05-23 (×2): qty 2

## 2019-05-23 MED ORDER — ONDANSETRON HCL 4 MG/2ML IJ SOLN: 4.0000 mg | Freq: Four times a day (QID) | INTRAMUSCULAR | Status: DC | PRN

## 2019-05-23 MED ORDER — APIXABAN 5 MG PO TABS
5.0000 mg | ORAL_TABLET | Freq: Two times a day (BID) | ORAL | Status: DC
  Administered 2019-05-24 – 2019-05-25 (×4): 5 mg via ORAL
  Filled 2019-05-23 (×4): qty 1

## 2019-05-23 MED ORDER — VANCOMYCIN HCL 500 MG IV SOLR
500.0000 mg | Freq: Once | INTRAVENOUS | Status: AC
  Administered 2019-05-24: 02:00:00 500 mg via INTRAVENOUS
  Filled 2019-05-23 (×2): qty 500

## 2019-05-23 MED ORDER — INSULIN ASPART 100 UNIT/ML ~~LOC~~ SOLN
0.0000 [IU] | Freq: Three times a day (TID) | SUBCUTANEOUS | Status: DC
  Administered 2019-05-24: 13:00:00 1 [IU] via SUBCUTANEOUS
  Administered 2019-05-24: 3 [IU] via SUBCUTANEOUS
  Administered 2019-05-25: 1 [IU] via SUBCUTANEOUS
  Filled 2019-05-23 (×3): qty 1

## 2019-05-23 MED ORDER — SODIUM CHLORIDE 0.9 % IV BOLUS
1000.0000 mL | Freq: Once | INTRAVENOUS | Status: AC
  Administered 2019-05-23: 1000 mL via INTRAVENOUS

## 2019-05-23 MED ORDER — ACETAMINOPHEN 650 MG RE SUPP: 650.0000 mg | Freq: Four times a day (QID) | RECTAL | Status: DC | PRN

## 2019-05-23 MED ORDER — AMIODARONE HCL 200 MG PO TABS
200.0000 mg | ORAL_TABLET | Freq: Two times a day (BID) | ORAL | Status: DC
  Administered 2019-05-24 – 2019-05-25 (×4): 200 mg via ORAL
  Filled 2019-05-23 (×4): qty 1

## 2019-05-23 MED ORDER — INSULIN ASPART 100 UNIT/ML ~~LOC~~ SOLN: 0.0000 [IU] | Freq: Every day | SUBCUTANEOUS | Status: DC

## 2019-05-23 NOTE — H&P (Signed)
[facility] NAME: Scott Gross    MR#:  [id number]  DATE OF BIRTH:  1933/11/22  DATE OF ADMISSION:  05/23/2019  PRIMARY CARE PHYSICIAN: [name], MD   REQUESTING/REFERRING PHYSICIAN: Jari Pigg, MD  CHIEF COMPLAINT:   Chief Complaint  Patient presents with  . Fever  . Generalized Body Aches    HISTORY OF PRESENT ILLNESS:  Scott Gross  is a 85 y.o. male who presents with chief complaint as above.  Patient was just admitted here earlier this week for new onset A. fib with RVR.  Since returning back home he has developed malaise, weakness, fever at home.  He was brought back for evaluation.  Here tonight he meets sirs criteria, though clear infectious source is not evident.  Some suspicion for UTI.  Hospitalist called for admission  PAST MEDICAL HISTORY:   Past Medical History:  Diagnosis Date  . Arthritis   . CAD (coronary artery disease) Nov. 12, 2012   Inferior ST elevation MI in November of 2012 with ventricular fibrillation arrest. Status post drug-eluting stent placement to the mid LAD. Most recent cardiac catheterization in June of this year showed patent stent with minimal restenosis, 75% proximal LAD and 80% distal LAD which were unchanged from before. Mild disease in the left circumflex and moderate RCA disease. Ejection fraction was 55%.  . Cancer of upper lobe of left lung ([place]) 11/2016   Rad tx's.  . Cardiac arrest [facility]) Nov. 2012   x 2   . Cardiac arrest - ventricular fibrillation   . HOH (hard of hearing)    Left Hearing Aid  . Hyperlipidemia   . Hypertension   . Post PTCA 2013   x2  . ST elevation MI (STEMI) ([place]) 08/26/2011     PAST SURGICAL HISTORY:   Past Surgical History:  Procedure Laterality Date  . CARDIAC CATHETERIZATION  2013   @ [place]; [facility] s/p stent   . CORONARY ANGIOPLASTY    . ELECTROMAGNETIC NAVIGATION BROCHOSCOPY N/A 11/27/2016   Procedure: ELECTROMAGNETIC NAVIGATION  BRONCHOSCOPY;  Surgeon: [name], MD;  Location: [facility] ORS;  Service: Cardiopulmonary;  Laterality: N/A;     SOCIAL HISTORY:   Social History   Tobacco Use  . Smoking status: Former Smoker    Packs/day: 1.00    Types: Cigarettes    Quit date: 08/27/2011    Years since quitting: 7.7  . Smokeless tobacco: Never Used  Substance Use Topics  . Alcohol use: No     FAMILY HISTORY:   Family History  Problem Relation Age of Onset  . Heart attack Mother   . Heart attack Father      DRUG ALLERGIES:   Allergies  Allergen Reactions  . Statins Other (See Comments)    Arthralagia    MEDICATIONS AT HOME:   Prior to Admission medications   Medication Sig Start Date End Date Taking? Authorizing Provider  amiodarone (PACERONE) 200 MG tablet Take 1 tablet (200 mg total) by mouth 2 (two) times daily. 05/21/19  Yes Bettey Costa, MD  apixaban (ELIQUIS) 5 MG TABS tablet Take 1 tablet (5 mg total) by mouth 2 (two) times daily. 05/21/19  Yes Bettey Costa, MD  diltiazem (CARDIZEM CD) 180 MG 24 hr capsule Take 1 capsule (180 mg total) by mouth daily. 05/21/19  Yes Mody, Ulice Bold, MD  furosemide (LASIX) 20 MG tablet Take 20 mg by mouth daily as needed for edema.  05/14/19 05/13/20 Yes [provider]  REVIEW OF SYSTEMS:  Review of Systems  Constitutional: Positive for fever and malaise/fatigue. Negative for chills and weight loss.  HENT: Negative for ear pain, hearing loss and tinnitus.   Eyes: Negative for blurred vision, double vision, pain and redness.  Respiratory: Negative for cough, hemoptysis and shortness of breath.   Cardiovascular: Negative for chest pain, palpitations, orthopnea and leg swelling.  Gastrointestinal: Negative for abdominal pain, constipation, diarrhea, nausea and vomiting.  Genitourinary: Negative for dysuria, frequency and hematuria.  Musculoskeletal: Negative for back pain, joint pain and neck pain.  Skin:       No acne, rash, or lesions  Neurological:  Negative for dizziness, tremors, focal weakness and weakness.  Endo/Heme/Allergies: Negative for polydipsia. Does not bruise/bleed easily.  Psychiatric/Behavioral: Negative for depression. The patient is not nervous/anxious and does not have insomnia.      VITAL SIGNS:   Vitals:   05/23/19 1847 05/23/19 1848  BP: 138/60   Pulse: 99   Resp: 18   Temp: 99.3 F (37.4 C)   TempSrc: Oral   SpO2: 95%   Weight:  63.5 kg  Height:  5\' 10"  (1.778 m)   Wt Readings from Last 3 Encounters:  05/23/19 63.5 kg  05/20/19 65.5 kg  04/27/19 64.8 kg    PHYSICAL EXAMINATION:  Physical Exam  Vitals reviewed. Constitutional: He is oriented to person, place, and time. He appears well-developed and well-nourished. No distress.  HENT:  Head: Normocephalic and atraumatic.  Mouth/Throat: Oropharynx is clear and moist.  Eyes: Pupils are equal, round, and reactive to light. Conjunctivae and EOM are normal. No scleral icterus.  Neck: Normal range of motion. Neck supple. No JVD present. No thyromegaly present.  Cardiovascular: Normal rate, regular rhythm and intact distal pulses. Exam reveals no gallop and no friction rub.  No murmur heard. Respiratory: Effort normal and breath sounds normal. No respiratory distress. He has no wheezes. He has no rales.  GI: Soft. Bowel sounds are normal. He exhibits no distension. There is no abdominal tenderness.  Musculoskeletal: Normal range of motion.        General: No edema.     Comments: No arthritis, no gout  Lymphadenopathy:    He has no cervical adenopathy.  Neurological: He is alert and oriented to person, place, and time. No cranial nerve deficit.  No dysarthria, no aphasia  Skin: Skin is warm and dry. No rash noted. No erythema.  Psychiatric: He has a normal mood and affect. His behavior is normal. Judgment and thought content normal.    LABORATORY PANEL:   CBC Recent Labs  Lab 05/23/19 1853  WBC 15.7*  HGB 12.1*  HCT 35.0*  PLT 244    ------------------------------------------------------------------------------------------------------------------  Chemistries  Recent Labs  Lab 05/21/19 0444 05/23/19 1853  NA 133* 126*  K 3.2* 3.2*  CL 101 92*  CO2 25 24  GLUCOSE 130* 209*  BUN 8 10  CREATININE 0.61 0.71  CALCIUM 7.9* 7.9*  MG 1.8  --   AST  --  17  ALT  --  14  ALKPHOS  --  78  BILITOT  --  1.1   ------------------------------------------------------------------------------------------------------------------  Cardiac Enzymes No results for input(s): TROPONINI in the last 168 hours. ------------------------------------------------------------------------------------------------------------------  RADIOLOGY:  Dg Chest 2 View  Result Date: 05/23/2019 CLINICAL DATA:  Suspected sepsis, known history of left upper lobe lung carcinoma EXAM: CHEST - 2 VIEW COMPARISON:  04/21/2019 FINDINGS: Cardiac shadows within normal limits. Stable left upper lobe mass lesion is noted with fiducial  markers in place. The lungs are otherwise clear. No bony abnormality is noted. IMPRESSION: Left upper lobe mass lesion consistent with the given clinical history. No acute abnormality noted. Electronically Signed   By: Inez Catalina M.D.   On: 05/23/2019 19:23    EKG:   Orders placed or performed during the hospital encounter of 05/23/19  . ED EKG 12-Lead  . ED EKG 12-Lead    IMPRESSION AND PLAN:  Principal Problem:   SIRS (systemic inflammatory response syndrome) (HCC) -IV antibiotics given, lactic acid within normal limits, blood pressure stable, suspicious for possible UTI though UA was not clearly evident for the same.  Urine culture sent.  Blood culture sent. Active Problems:   CAD (coronary artery disease) -continue home meds   Hypertension -home dose antihypertensives   Diabetes mellitus type 2, uncomplicated (HCC) -sliding scale insulin   PAF (paroxysmal atrial fibrillation) ([place]) -continue home meds including  anticoagulation   Hyperlipidemia -home dose antilipid  Chart review performed and case discussed with ED provider. Labs, imaging and/or ECG reviewed by provider and discussed with patient/family. Management plans discussed with the patient and/or family.  COVID-19 status: Tested negative     DVT PROPHYLAXIS: Systemic anticoagulation  GI PROPHYLAXIS:  None  ADMISSION STATUS: Inpatient     CODE STATUS: Full Code Status History    Date Active Date Inactive Code Status Order ID Comments User Context   05/20/2019 1449 05/21/2019 1727 Full Code [id number]  Saundra Shelling, MD ED   Advance Care Planning Activity      TOTAL TIME TAKING CARE OF THIS PATIENT: 45 minutes.   This patient was evaluated in the context of the global COVID-19 pandemic, which necessitated consideration that the patient might be at risk for infection with the SARS-CoV-2 virus that causes COVID-19. Institutional protocols and algorithms that pertain to the evaluation of patients at risk for COVID-19 are in a state of rapid change based on information released by regulatory bodies including the CDC and federal and state organizations. These policies and algorithms were followed to the best of this provider's knowledge to date during the patient's care at this facility.  Ethlyn Daniels 05/23/2019, 10:44 PM  [facility]  Office  [phone]  CC: Primary care physician; [name], MD  Note:  This document was prepared using Dragon voice recognition software and may include unintentional dictation errors.

## 2019-05-23 NOTE — ED Provider Notes (Signed)
[facility] Emergency Department Provider Note  ____________________________________________   First MD Initiated Contact with Patient 05/23/19 1954     (approximate)  I have reviewed the triage vital signs and the nursing notes.   HISTORY  Chief Complaint Fever and Generalized Body Aches    HPI Scott Gross is a 85 y.o. male with left upper lobe cancer whose last radiation treatment was on Tuesday and recent diagnosis of new onset A. fib he was previously admitted on Wednesday to the ICU after cardioversion and discharge Thursday who now presents for increasing weakness.  Per family he had a temperature of 101.1.  Patient is having joint pain, headache, body aches.  These have since resolved.  Denies any headache or neck stiffness at this time.  The fever occurred today, one time, did not take Tylenol, better on its own, nothing seemed to have brought it on.  Denies any urinary symptoms.         Past Medical History:  Diagnosis Date  . Arthritis   . CAD (coronary artery disease) Nov. 12, 2012   Inferior ST elevation MI in November of 2012 with ventricular fibrillation arrest. Status post drug-eluting stent placement to the mid LAD. Most recent cardiac catheterization in June of this year showed patent stent with minimal restenosis, 75% proximal LAD and 80% distal LAD which were unchanged from before. Mild disease in the left circumflex and moderate RCA disease. Ejection fraction was 55%.  . Cancer of upper lobe of left lung ([place]) 11/2016   Rad tx's.  . Cardiac arrest [facility]) Nov. 2012   x 2   . Cardiac arrest - ventricular fibrillation   . HOH (hard of hearing)    Left Hearing Aid  . Hyperlipidemia   . Hypertension   . Post PTCA 2013   x2  . ST elevation MI (STEMI) ([place]) 08/26/2011    Patient Active Problem List   Diagnosis Date Noted  . Atrial fibrillation with RVR ([place])   . A-fib ([place]) 05/20/2019  . Cardiac arrest ([place])   . Diabetes  mellitus type 2, uncomplicated ([place]) 35/45/6256  . Angina pectoris ([place]) 12/17/2016  . Adenopathy   . Cancer of upper lobe of left lung ([place]) 09/10/2016  . Hyperlipidemia   . Hypertension   . CAD (coronary artery disease) 08/27/2011  . Myocardial infarction ([place]) 08/16/2011    Past Surgical History:  Procedure Laterality Date  . CARDIAC CATHETERIZATION  2013   @ [place]; [facility] s/p stent   . CORONARY ANGIOPLASTY    . ELECTROMAGNETIC NAVIGATION BROCHOSCOPY N/A 11/27/2016   Procedure: ELECTROMAGNETIC NAVIGATION BRONCHOSCOPY;  Surgeon: [name], MD;  Location: [facility] ORS;  Service: Cardiopulmonary;  Laterality: N/A;    Prior to Admission medications   Medication Sig Start Date End Date Taking? Authorizing Provider  amiodarone (PACERONE) 200 MG tablet Take 1 tablet (200 mg total) by mouth 2 (two) times daily. 05/21/19   Bettey Costa, MD  apixaban (ELIQUIS) 5 MG TABS tablet Take 1 tablet (5 mg total) by mouth 2 (two) times daily. 05/21/19   Bettey Costa, MD  diltiazem (CARDIZEM CD) 180 MG 24 hr capsule Take 1 capsule (180 mg total) by mouth daily. 05/21/19   Bettey Costa, MD  furosemide (LASIX) 20 MG tablet Take 20 mg by mouth daily. As needed for edema 05/14/19 05/13/20  [provider]    Allergies Statins  Family History  Problem Relation Age of Onset  . Heart attack Mother   . Heart  attack Father     Social History Social History   Tobacco Use  . Smoking status: Former Smoker    Packs/day: 1.00    Types: Cigarettes    Quit date: 08/27/2011    Years since quitting: 7.7  . Smokeless tobacco: Never Used  Substance Use Topics  . Alcohol use: No  . Drug use: No      Review of Systems Constitutional: Positive for fever Eyes: No visual changes. ENT: No sore throat. Cardiovascular: Denies chest pain. Respiratory: Denies shortness of breath. Gastrointestinal: No abdominal pain.  No nausea, no vomiting.  No diarrhea.  No constipation. Genitourinary: Negative for dysuria.  Musculoskeletal: Negative for back pain.  Positive joint pain Skin: Negative for rash. Neurological: Positive for headache now resolved, focal weakness or numbness. All other ROS negative ____________________________________________   PHYSICAL EXAM:  VITAL SIGNS: ED Triage Vitals  Enc Vitals Group     BP 05/23/19 1847 138/60     Pulse Rate 05/23/19 1847 99     Resp 05/23/19 1847 18     Temp 05/23/19 1847 99.3 F (37.4 C)     Temp Source 05/23/19 1847 Oral     SpO2 05/23/19 1847 95 %     Weight 05/23/19 1848 140 lb (63.5 kg)     Height 05/23/19 1848 5\' 10"  (1.778 m)     Head Circumference --      Peak Flow --      Pain Score 05/23/19 1848 2     Pain Loc --      Pain Edu? --      Excl. in [place]? --     Constitutional: Alert and oriented. Well appearing and in no acute distress. Eyes: Conjunctivae are normal. EOMI. Head: Atraumatic. Nose: No congestion/rhinnorhea. Mouth/Throat: Mucous membranes are moist.   Neck: No stridor. Trachea Midline. FROM Cardiovascular: Normal rate, regular rhythm. Grossly normal heart sounds.  Good peripheral circulation. Respiratory: Normal respiratory effort.  No retractions. Lungs CTAB. Gastrointestinal: Soft and nontender. No distention. No abdominal bruits.  Musculoskeletal: No lower extremity tenderness nor edema.  No joint effusions. Neurologic:  Normal speech and language. No gross focal neurologic deficits are appreciated.  Skin:  Skin is warm, dry and intact. No rash noted. Psychiatric: Mood and affect are normal. Speech and behavior are normal. GU: Deferred   ____________________________________________   LABS (all labs ordered are listed, but only abnormal results are displayed)  Labs Reviewed  CBC WITH DIFFERENTIAL/PLATELET - Abnormal; Notable for the following components:      Result Value   WBC 15.7 (*)    RBC 4.13 (*)    Hemoglobin 12.1 (*)    HCT 35.0 (*)    Neutro Abs 13.7 (*)    Monocytes Absolute 1.2 (*)    Abs  Immature Granulocytes 0.09 (*)    All other components within normal limits  PROTIME-INR - Abnormal; Notable for the following components:   Prothrombin Time 17.0 (*)    INR 1.4 (*)    All other components within normal limits  CULTURE, BLOOD (ROUTINE X 2)  CULTURE, BLOOD (ROUTINE X 2)  LACTIC ACID, PLASMA  COMPREHENSIVE METABOLIC PANEL  LACTIC ACID, PLASMA  URINALYSIS, COMPLETE (UACMP) WITH MICROSCOPIC   ____________________________________________   ED ECG REPORT I, Vanessa [place], the attending physician, personally viewed and interpreted this ECG.  EKG is normal sinus rate of 100, no ST elevation, no T wave inversion normal intervals ____________________________________________  RADIOLOGY Robert Bellow, personally viewed and evaluated  these images (plain radiographs) as part of my medical decision making, as well as reviewing the written report by the radiologist.  ED MD interpretation: Chest x-ray is consistent with his left upper lobe mass  Official radiology report(s): Dg Chest 2 View  Result Date: 05/23/2019 CLINICAL DATA:  Suspected sepsis, known history of left upper lobe lung carcinoma EXAM: CHEST - 2 VIEW COMPARISON:  04/21/2019 FINDINGS: Cardiac shadows within normal limits. Stable left upper lobe mass lesion is noted with fiducial markers in place. The lungs are otherwise clear. No bony abnormality is noted. IMPRESSION: Left upper lobe mass lesion consistent with the given clinical history. No acute abnormality noted. Electronically Signed   By: Inez Catalina M.D.   On: 05/23/2019 19:23    ____________________________________________   PROCEDURES  Procedure(s) performed (including Critical Care):  .Critical Care Performed by: Vanessa [place], MD Authorized by: Vanessa [place], MD   Critical care provider statement:    Critical care time (minutes):  45   Critical care was necessary to treat or prevent imminent or life-threatening deterioration of the following  conditions:  Sepsis   Critical care was time spent personally by me on the following activities:  Discussions with consultants, evaluation of patient's response to treatment, examination of patient, ordering and performing treatments and interventions, ordering and review of laboratory studies, ordering and review of radiographic studies, pulse oximetry, re-evaluation of patient's condition, obtaining history from patient or surrogate and review of old charts     ____________________________________________   INITIAL IMPRESSION / ASSESSMENT AND PLAN / ED COURSE  Scott Gross was evaluated in Emergency Department on 05/23/2019 for the symptoms described in the history of present illness. He was evaluated in the context of the global COVID-19 pandemic, which necessitated consideration that the patient might be at risk for infection with the SARS-CoV-2 virus that causes COVID-19. Institutional protocols and algorithms that pertain to the evaluation of patients at risk for COVID-19 are in a state of rapid change based on information released by regulatory bodies including the CDC and federal and state organizations. These policies and algorithms were followed during the patient's care in the ED.    This is most concerning for infection/sepsis.  Consider PNA, UTI, bacteremia.  No abd pain to suggest abd infection.  NO headache/neckstiffness to suggest meningitis.  No swollen joints concerning for septic joints.    White count is elevated at 15.7.  Hemoglobin is stable.  8:08 PM at this time I evaluated patient.  Patient meets sirs criteria with the white count and heart rate.  Patient is afebrile here but did have a temperature at home.  We will do a sepsis alert and start broad-spectrum antibiotics given his recent admission.  We will give 1 L fluid and hold off on the for a full 30 ml/kg given patient is on Lasix and has some edema in his legs and his blood pressures at this time are stable.  9:43  PM d/w hospital team and they will admit.           ____________________________________________   FINAL CLINICAL IMPRESSION(S) / ED DIAGNOSES   Final diagnoses:  Sepsis, due to unspecified organism, unspecified whether acute organ dysfunction present [facility])      MEDICATIONS GIVEN DURING THIS VISIT:  Medications  metroNIDAZOLE (FLAGYL) IVPB 500 mg (500 mg Intravenous New Bag/Given 05/24/19 0023)  vancomycin (VANCOCIN) 500 mg in sodium chloride 0.9 % 100 mL IVPB (has no administration in time range)  amiodarone (PACERONE) tablet  200 mg (200 mg Oral Given 05/24/19 0017)  diltiazem (CARDIZEM CD) 24 hr capsule 180 mg (has no administration in time range)  apixaban (ELIQUIS) tablet 5 mg (5 mg Oral Given 05/24/19 0017)  acetaminophen (TYLENOL) tablet 650 mg (has no administration in time range)    Or  acetaminophen (TYLENOL) suppository 650 mg (has no administration in time range)  ondansetron (ZOFRAN) tablet 4 mg (has no administration in time range)    Or  ondansetron (ZOFRAN) injection 4 mg (has no administration in time range)  insulin aspart (novoLOG) injection 0-9 Units (has no administration in time range)  insulin aspart (novoLOG) injection 0-5 Units (0 Units Subcutaneous Not Given 05/24/19 0000)  0.9 %  sodium chloride infusion (20 mLs Intravenous New Bag/Given 05/24/19 0020)  ceFEPIme (MAXIPIME) 2 g in sodium chloride 0.9 % 100 mL IVPB (has no administration in time range)  vancomycin (VANCOCIN) 1,250 mg in sodium chloride 0.9 % 250 mL IVPB (has no administration in time range)  ceFEPIme (MAXIPIME) 2 g in sodium chloride 0.9 % 100 mL IVPB (0 g Intravenous Stopped 05/23/19 2213)  vancomycin (VANCOCIN) IVPB 1000 mg/200 mL premix (0 mg Intravenous Stopped 05/24/19 0015)  sodium chloride 0.9 % bolus 1,000 mL (1,000 mLs Intravenous New Bag/Given 05/23/19 2127)     ED Discharge Orders    None       Note:  This document was prepared using Dragon voice recognition software and may  include unintentional dictation errors.   Vanessa [place]skridge, MD 05/24/19 [place]

## 2019-05-23 NOTE — ED Triage Notes (Signed)
Pt was here Wed and dx with new onset afib - he is cancer center pt (left upper lobe CA) - last radiation Tuesday - pt admitted Wed to ICU after cardioversion and discharged Thursday - today he is weak , BP 142/62, 88, 95%, temp 101.1 per daughter - she notified Dr Clayborn Bigness and he requested pt come to ED - c/o headache, joint pain, body aches

## 2019-05-23 NOTE — ED Notes (Signed)
Dr. Funke at bedside. 

## 2019-05-23 NOTE — Progress Notes (Signed)
CODE SEPSIS - PHARMACY COMMUNICATION  **Broad Spectrum Antibiotics should be administered within 1 hour of Sepsis diagnosis**  Time Code Sepsis Called/Page Received:  8/8 @ 2010   Antibiotics Ordered: Cefepime 2 gm   Time of 1st antibiotic administration: 8/8 @ 2126  Additional action taken by pharmacy:   If necessary, Name of Provider/Nurse Contacted:     Karlea Mckibbin D ,PharmD Clinical Pharmacist  05/23/2019  9:53 PM

## 2019-05-23 NOTE — ED Notes (Signed)
Admitting Dr at bedside

## 2019-05-23 NOTE — Progress Notes (Signed)
PHARMACY -  BRIEF ANTIBIOTIC NOTE   Pharmacy has received consult(s) for Vancomycin, Cefepime from an ED provider.  The patient's profile has been reviewed for ht/wt/allergies/indication/available labs.    One time order(s) placed for Vancomycin 500 mg IV X 1 to make total loading dose of 1500 mg.   Further antibiotics/pharmacy consults should be ordered by admitting physician if indicated.                       Thank you, Arelie Kuzel D 05/23/2019  8:29 PM

## 2019-05-23 NOTE — ED Notes (Signed)
ED TO INPATIENT HANDOFF REPORT  ED Nurse Name and Phone #: Wells Guiles [phone]  S Name/Age/Gender Scott Gross 85 y.o. male Room/Bed: ED06A/ED06A  Code Status   Code Status: Prior  Home/SNF/Other Home Patient oriented to: self, place, time and situation Is this baseline? Yes   Triage Complete: Triage complete  Chief Complaint Fever  Triage Note Pt was here Wed and dx with new onset afib - he is cancer center pt (left upper lobe CA) - last radiation Tuesday - pt admitted Wed to ICU after cardioversion and discharged Thursday - today he is weak , BP 142/62, 88, 95%, temp 101.1 per daughter - she notified Dr Clayborn Bigness and he requested pt come to ED - c/o headache, joint pain, body aches   Allergies Allergies  Allergen Reactions  . Statins Other (See Comments)    Arthralagia    Level of Care/Admitting Diagnosis ED Disposition    ED Disposition Condition [facility]: [place] [[id number]]  Level of Care: Med-Surg [16]  Covid Evaluation: Confirmed COVID Negative  Diagnosis: SIRS (systemic inflammatory response syndrome) [facility]) [[id number]]  Admitting Physician: Lance Coon [[id number]]  Attending Physician: Jannifer Franklin, DAVID [phone]  Estimated length of stay: past midnight tomorrow  Certification:: I certify this patient will need inpatient services for at least 2 midnights  PT Class (Do Not Modify): Inpatient [101]  PT Acc Code (Do Not Modify): Private [1]       B Medical/Surgery History Past Medical History:  Diagnosis Date  . Arthritis   . CAD (coronary artery disease) Nov. 12, 2012   Inferior ST elevation MI in November of 2012 with ventricular fibrillation arrest. Status post drug-eluting stent placement to the mid LAD. Most recent cardiac catheterization in June of this year showed patent stent with minimal restenosis, 75% proximal LAD and 80% distal LAD which were unchanged from before. Mild disease in the left circumflex and moderate  RCA disease. Ejection fraction was 55%.  . Cancer of upper lobe of left lung ([place]) 11/2016   Rad tx's.  . Cardiac arrest [facility]) Nov. 2012   x 2   . Cardiac arrest - ventricular fibrillation   . HOH (hard of hearing)    Left Hearing Aid  . Hyperlipidemia   . Hypertension   . Post PTCA 2013   x2  . ST elevation MI (STEMI) ([place]) 08/26/2011   Past Surgical History:  Procedure Laterality Date  . CARDIAC CATHETERIZATION  2013   @ [place]; [facility] s/p stent   . CORONARY ANGIOPLASTY    . ELECTROMAGNETIC NAVIGATION BROCHOSCOPY N/A 11/27/2016   Procedure: ELECTROMAGNETIC NAVIGATION BRONCHOSCOPY;  Surgeon: [name], MD;  Location: [facility] ORS;  Service: Cardiopulmonary;  Laterality: N/A;     A IV Location/Drains/Wounds Patient Lines/Drains/Airways Status   Active Line/Drains/Airways    Name:   Placement date:   Placement time:   Site:   Days:   Peripheral IV 05/23/19 Left Antecubital   05/23/19    1958    Antecubital   less than 1          Intake/Output Last 24 hours  Intake/Output Summary (Last 24 hours) at 05/23/2019 2305 Last data filed at 05/23/2019 2213 Gross per 24 hour  Intake 100 ml  Output -  Net 100 ml    Labs/Imaging Results for orders placed or performed during the hospital encounter of 05/23/19 (from the past 48 hour(s))  Comprehensive metabolic panel     Status: Abnormal   Collection Time:  05/23/19  6:53 PM  Result Value Ref Range   Sodium 126 (L) 135 - 145 mmol/L    Comment: RESULTS VERIFIED BY REPEAT TESTING JJB   Potassium 3.2 (L) 3.5 - 5.1 mmol/L   Chloride 92 (L) 98 - 111 mmol/L   CO2 24 22 - 32 mmol/L   Glucose, Bld 209 (H) 70 - 99 mg/dL   BUN 10 8 - 23 mg/dL   Creatinine, Ser 0.71 0.61 - 1.24 mg/dL   Calcium 7.9 (L) 8.9 - 10.3 mg/dL   Total Protein 6.2 (L) 6.5 - 8.1 g/dL   Albumin 3.0 (L) 3.5 - 5.0 g/dL   AST 17 15 - 41 U/L   ALT 14 0 - 44 U/L   Alkaline Phosphatase 78 38 - 126 U/L   Total Bilirubin 1.1 0.3 - 1.2 mg/dL   GFR calc non Af Amer >60 >60  mL/min   GFR calc Af Amer >60 >60 mL/min   Anion gap 10 5 - 15    Comment: Performed at [facility], [address] [place], [place] [address]  Lactic acid, plasma     Status: None   Collection Time: 05/23/19  6:53 PM  Result Value Ref Range   Lactic Acid, Venous 1.8 0.5 - 1.9 mmol/L    Comment: Performed at [facility], [address] [place], [place] [address]  CBC with Differential     Status: Abnormal   Collection Time: 05/23/19  6:53 PM  Result Value Ref Range   WBC 15.7 (H) 4.0 - 10.5 K/uL   RBC 4.13 (L) 4.22 - 5.81 MIL/uL   Hemoglobin 12.1 (L) 13.0 - 17.0 g/dL   HCT 35.0 (L) 39.0 - 52.0 %   MCV 84.7 80.0 - 100.0 fL   MCH 29.3 26.0 - 34.0 pg   MCHC 34.6 30.0 - 36.0 g/dL   RDW 12.6 11.5 - 15.5 %   Platelets 244 150 - 400 K/uL   nRBC 0.0 0.0 - 0.2 %   Neutrophils Relative % 87 %   Neutro Abs 13.7 (H) 1.7 - 7.7 K/uL   Lymphocytes Relative 4 %   Lymphs Abs 0.7 0.7 - 4.0 K/uL   Monocytes Relative 8 %   Monocytes Absolute 1.2 (H) 0.1 - 1.0 K/uL   Eosinophils Relative 0 %   Eosinophils Absolute 0.0 0.0 - 0.5 K/uL   Basophils Relative 0 %   Basophils Absolute 0.0 0.0 - 0.1 K/uL   Immature Granulocytes 1 %   Abs Immature Granulocytes 0.09 (H) 0.00 - 0.07 K/uL    Comment: Performed at [facility], [address] [place], [place] [address]  Protime-INR     Status: Abnormal   Collection Time: 05/23/19  6:53 PM  Result Value Ref Range   Prothrombin Time 17.0 (H) 11.4 - 15.2 seconds   INR 1.4 (H) 0.8 - 1.2    Comment: (NOTE) INR goal varies based on device and disease states. Performed at [facility], [address] [place], [place] [address]   SARS Coronavirus 2 [facility] order, Performed in [facility] hospital lab) Nasopharyngeal Nasopharyngeal Swab     Status: None   Collection Time: 05/23/19  8:27 PM   Specimen: Nasopharyngeal Swab  Result Value Ref Range   SARS Coronavirus 2 NEGATIVE NEGATIVE    Comment: (NOTE) If result  is NEGATIVE SARS-CoV-2 target nucleic acids are NOT DETECTED. The SARS-CoV-2 RNA is generally detectable in upper and lower  respiratory specimens during the acute phase  of infection. The lowest  concentration of SARS-CoV-2 viral copies this assay can detect is 250  copies / mL. A negative result does not preclude SARS-CoV-2 infection  and should not be used as the sole basis for treatment or other  patient management decisions.  A negative result may occur with  improper specimen collection / handling, submission of specimen other  than nasopharyngeal swab, presence of viral mutation(s) within the  areas targeted by this assay, and inadequate number of viral copies  (<250 copies / mL). A negative result must be combined with clinical  observations, patient history, and epidemiological information. If result is POSITIVE SARS-CoV-2 target nucleic acids are DETECTED. The SARS-CoV-2 RNA is generally detectable in upper and lower  respiratory specimens dur ing the acute phase of infection.  Positive  results are indicative of active infection with SARS-CoV-2.  Clinical  correlation with patient history and other diagnostic information is  necessary to determine patient infection status.  Positive results do  not rule out bacterial infection or co-infection with other viruses. If result is PRESUMPTIVE POSTIVE SARS-CoV-2 nucleic acids MAY BE PRESENT.   A presumptive positive result was obtained on the submitted specimen  and confirmed on repeat testing.  While 2019 novel coronavirus  (SARS-CoV-2) nucleic acids may be present in the submitted sample  additional confirmatory testing may be necessary for epidemiological  and / or clinical management purposes  to differentiate between  SARS-CoV-2 and other Sarbecovirus currently known to infect humans.  If clinically indicated additional testing with an alternate test  methodology [phone]) is advised. The SARS-CoV-2 RNA is generally   detectable in upper and lower respiratory sp ecimens during the acute  phase of infection. The expected result is Negative. Fact Sheet for Patients:  StrictlyIdeas.no Fact Sheet for Healthcare Providers: BankingDealers.co.za This test is not yet approved or cleared by the [place] FDA and has been authorized for detection and/or diagnosis of SARS-CoV-2 by FDA under an Emergency Use Authorization (EUA).  This EUA will remain in effect (meaning this test can be used) for the duration of the COVID-19 declaration under Section 564(b)(1) of the Act, 21 U.S.C. section 360bbb-3(b)(1), unless the authorization is terminated or revoked sooner. Performed at [facility], [address] [place], [place] [address]   Urinalysis, Complete w Microscopic     Status: Abnormal   Collection Time: 05/23/19 10:07 PM  Result Value Ref Range   Color, Urine YELLOW (A) YELLOW   APPearance HAZY (A) CLEAR   Specific Gravity, Urine 1.015 1.005 - 1.030   pH 8.0 5.0 - 8.0   Glucose, UA 150 (A) NEGATIVE mg/dL   Hgb urine dipstick NEGATIVE NEGATIVE   Bilirubin Urine NEGATIVE NEGATIVE   Ketones, ur NEGATIVE NEGATIVE mg/dL   Protein, ur 30 (A) NEGATIVE mg/dL   Nitrite NEGATIVE NEGATIVE   Leukocytes,Ua NEGATIVE NEGATIVE   RBC / HPF 0-5 0 - 5 RBC/hpf   WBC, UA 0-5 0 - 5 WBC/hpf   Bacteria, UA MANY (A) NONE SEEN   Squamous Epithelial / LPF NONE SEEN 0 - 5   Mucus PRESENT    Amorphous Crystal PRESENT     Comment: Performed at [facility], [address] [place], [place] [address]  Lactic acid, plasma     Status: None   Collection Time: 05/23/19 10:07 PM  Result Value Ref Range   Lactic Acid, Venous 1.5 0.5 - 1.9 mmol/L    Comment: Performed at [facility], [place],  [place] [address]  APTT     Status: Abnormal   Collection Time: 05/23/19 10:09 PM  Result Value Ref Range   aPTT 37 (H) 24 - 36 seconds     Comment:        IF BASELINE aPTT IS ELEVATED, SUGGEST PATIENT RISK ASSESSMENT BE USED TO DETERMINE APPROPRIATE ANTICOAGULANT THERAPY. Performed at [facility], [address] [place], [place] [address]    Dg Chest 2 View  Result Date: 05/23/2019 CLINICAL DATA:  Suspected sepsis, known history of left upper lobe lung carcinoma EXAM: CHEST - 2 VIEW COMPARISON:  04/21/2019 FINDINGS: Cardiac shadows within normal limits. Stable left upper lobe mass lesion is noted with fiducial markers in place. The lungs are otherwise clear. No bony abnormality is noted. IMPRESSION: Left upper lobe mass lesion consistent with the given clinical history. No acute abnormality noted. Electronically Signed   By: Inez Catalina M.D.   On: 05/23/2019 19:23    Pending Labs Unresulted Labs (From admission, onward)    Start     Ordered   05/23/19 2010  Urine culture  ONCE - STAT,   STAT     05/23/19 2016   05/23/19 1852  Culture, blood (Routine x 2)  BLOOD CULTURE X 2,   STAT     05/23/19 1851   Signed and Held  Hemoglobin A1c  Once,   R    Comments: To assess prior glycemic control    Signed and Held   Signed and Held  Basic metabolic panel  Tomorrow morning,   R     Signed and Held   Signed and Held  CBC  Tomorrow morning,   R     Signed and Held          Vitals/Pain Today's Vitals   05/23/19 1847 05/23/19 1848  BP: 138/60   Pulse: 99   Resp: 18   Temp: 99.3 F (37.4 C)   TempSrc: Oral   SpO2: 95%   Weight:  63.5 kg  Height:  5\' 10"  (1.778 m)  PainSc:  2     Isolation Precautions No active isolations  Medications Medications  metroNIDAZOLE (FLAGYL) IVPB 500 mg (has no administration in time range)  vancomycin (VANCOCIN) IVPB 1000 mg/200 mL premix (1,000 mg Intravenous New Bag/Given 05/23/19 2214)  vancomycin (VANCOCIN) 500 mg in sodium chloride 0.9 % 100 mL IVPB (has no administration in time range)  ceFEPIme (MAXIPIME) 2 g in sodium chloride 0.9 % 100 mL IVPB (0 g Intravenous  Stopped 05/23/19 2213)  sodium chloride 0.9 % bolus 1,000 mL (1,000 mLs Intravenous New Bag/Given 05/23/19 2127)    Mobility walks Low fall risk   Focused Assessments sepsis   R Recommendations: See Admitting Provider Note  Report given to:   Additional Notes:

## 2019-05-24 ENCOUNTER — Other Ambulatory Visit: Payer: Self-pay

## 2019-05-24 LAB — CBC
HCT: 33.9 % — ABNORMAL LOW (ref 39.0–52.0)
Hemoglobin: 11.7 g/dL — ABNORMAL LOW (ref 13.0–17.0)
MCH: 29.2 pg (ref 26.0–34.0)
MCHC: 34.5 g/dL (ref 30.0–36.0)
MCV: 84.5 fL (ref 80.0–100.0)
Platelets: 231 10*3/uL (ref 150–400)
RBC: 4.01 MIL/uL — ABNORMAL LOW (ref 4.22–5.81)
RDW: 12.5 % (ref 11.5–15.5)
WBC: 14.4 10*3/uL — ABNORMAL HIGH (ref 4.0–10.5)
nRBC: 0 % (ref 0.0–0.2)

## 2019-05-24 LAB — BASIC METABOLIC PANEL
Anion gap: 9 (ref 5–15)
BUN: 8 mg/dL (ref 8–23)
CO2: 23 mmol/L (ref 22–32)
Calcium: 7.6 mg/dL — ABNORMAL LOW (ref 8.9–10.3)
Chloride: 97 mmol/L — ABNORMAL LOW (ref 98–111)
Creatinine, Ser: 0.66 mg/dL (ref 0.61–1.24)
GFR calc Af Amer: >60 mL/min (ref >60)
GFR calc non Af Amer: >60 mL/min (ref >60)
Glucose, Bld: 156 mg/dL — ABNORMAL HIGH (ref 70–99)
Potassium: 3.1 mmol/L — ABNORMAL LOW (ref 3.5–5.1)
Sodium: 129 mmol/L — ABNORMAL LOW (ref 135–145)

## 2019-05-24 LAB — GLUCOSE, CAPILLARY
Glucose-Capillary: 142 mg/dL — ABNORMAL HIGH (ref 70–99)
Glucose-Capillary: 144 mg/dL — ABNORMAL HIGH (ref 70–99)
Glucose-Capillary: 160 mg/dL — ABNORMAL HIGH (ref 70–99)
Glucose-Capillary: 170 mg/dL — ABNORMAL HIGH (ref 70–99)
Glucose-Capillary: 178 mg/dL — ABNORMAL HIGH (ref 70–99)
Glucose-Capillary: 201 mg/dL — ABNORMAL HIGH (ref 70–99)

## 2019-05-24 MED ORDER — POTASSIUM CHLORIDE CRYS ER 20 MEQ PO TBCR
40.0000 meq | EXTENDED_RELEASE_TABLET | Freq: Once | ORAL | Status: AC
  Administered 2019-05-24: 10:00:00 40 meq via ORAL
  Filled 2019-05-24: qty 2

## 2019-05-24 MED ORDER — SODIUM CHLORIDE 0.9 % IV SOLN
2.0000 g | Freq: Three times a day (TID) | INTRAVENOUS | Status: DC
  Administered 2019-05-24 – 2019-05-25 (×4): 2 g via INTRAVENOUS
  Filled 2019-05-24 (×6): qty 2

## 2019-05-24 MED ORDER — SODIUM CHLORIDE 0.9 % IV SOLN
INTRAVENOUS | Status: DC | PRN
  Administered 2019-05-24: 30 mL via INTRAVENOUS
  Administered 2019-05-24: 20 mL via INTRAVENOUS
  Administered 2019-05-25 (×2): 250 mL via INTRAVENOUS

## 2019-05-24 MED ORDER — VANCOMYCIN HCL 1.25 G IV SOLR
1250.0000 mg | INTRAVENOUS | Status: DC
  Administered 2019-05-25: 01:00:00 1250 mg via INTRAVENOUS
  Filled 2019-05-24 (×2): qty 1250

## 2019-05-24 NOTE — Progress Notes (Signed)
[place] [facility] [place] NAME: Scott Gross    MR#:  [id number]  DATE OF BIRTH:  August 06, 1934  SUBJECTIVE:  CHIEF COMPLAINT:   Chief Complaint  Patient presents with  . Fever  . Generalized Body Aches    REVIEW OF SYSTEMS:    ROS  CONSTITUTIONAL: No documented fever. No fatigue, weakness. No weight gain, no weight loss.  EYES: No blurry or double vision.  ENT: No tinnitus. No postnasal drip. No redness of the oropharynx.  RESPIRATORY: No cough, no wheeze, no hemoptysis. No dyspnea.  CARDIOVASCULAR: No chest pain. No orthopnea. No palpitations. No syncope.  GASTROINTESTINAL: No nausea, no vomiting or diarrhea. No abdominal pain. No melena or hematochezia.  GENITOURINARY: No dysuria or hematuria.  ENDOCRINE: No polyuria or nocturia. No heat or cold intolerance.  HEMATOLOGY: No anemia. No bruising. No bleeding.  INTEGUMENTARY: No rashes. No lesions.  MUSCULOSKELETAL: No arthritis. No swelling. No gout.  NEUROLOGIC: No numbness, tingling, or ataxia. No seizure-type activity.  PSYCHIATRIC: No anxiety. No insomnia. No ADD.   DRUG ALLERGIES:   Allergies  Allergen Reactions  . Statins Other (See Comments)    Arthralagia    VITALS:  Blood pressure 125/73, pulse 83, temperature 98.3 F (36.8 C), temperature source Oral, resp. rate 19, height 5\' 10"  (1.778 m), weight 63.5 kg, SpO2 97 %.  PHYSICAL EXAMINATION:   Physical Exam  GENERAL:  85 y.o.-year-old patient lying in the bed with no acute distress.  EYES: Pupils equal, round, reactive to light and accommodation. No scleral icterus. Extraocular muscles intact.  HEENT: Head atraumatic, normocephalic. Oropharynx and nasopharynx clear.  NECK:  Supple, no jugular venous distention. No thyroid enlargement, no tenderness.  LUNGS: Normal breath sounds bilaterally, no wheezing, rales, rhonchi. No use of accessory muscles of respiration.  CARDIOVASCULAR: S1, S2 normal. No murmurs, rubs, or  gallops.  ABDOMEN: Soft, nontender, nondistended. Bowel sounds present. No organomegaly or mass.  EXTREMITIES: No cyanosis, clubbing or edema b/l.    NEUROLOGIC: Cranial nerves II through XII are intact. No focal Motor or sensory deficits b/l.   PSYCHIATRIC: The patient is alert and oriented x 3.  SKIN: No obvious rash, lesion, or ulcer.   LABORATORY PANEL:   CBC Recent Labs  Lab 05/24/19 0452  WBC 14.4*  HGB 11.7*  HCT 33.9*  PLT 231   ------------------------------------------------------------------------------------------------------------------ Chemistries  Recent Labs  Lab 05/21/19 0444 05/23/19 1853 05/24/19 0452  NA 133* 126* 129*  K 3.2* 3.2* 3.1*  CL 101 92* 97*  CO2 25 24 23   GLUCOSE 130* 209* 156*  BUN 8 10 8   CREATININE 0.61 0.71 0.66  CALCIUM 7.9* 7.9* 7.6*  MG 1.8  --   --   AST  --  17  --   ALT  --  14  --   ALKPHOS  --  78  --   BILITOT  --  1.1  --    ------------------------------------------------------------------------------------------------------------------  Cardiac Enzymes No results for input(s): TROPONINI in the last 168 hours. ------------------------------------------------------------------------------------------------------------------  RADIOLOGY:  Dg Chest 2 View  Result Date: 05/23/2019 CLINICAL DATA:  Suspected sepsis, known history of left upper lobe lung carcinoma EXAM: CHEST - 2 VIEW COMPARISON:  04/21/2019 FINDINGS: Cardiac shadows within normal limits. Stable left upper lobe mass lesion is noted with fiducial markers in place. The lungs are otherwise clear. No bony abnormality is noted. IMPRESSION: Left upper lobe mass lesion consistent with the given clinical history. No acute abnormality noted. Electronically Signed  By: Inez Catalina M.D.   On: 05/23/2019 19:23     ASSESSMENT AND PLAN:   85 year old male patient with history of lung cancer, coronary artery disease, hyperlipidemia, hypertension, myocardial infarction  currently under hospitalist service  -Systemic inflammatory response syndrome Continue IV antibiotics Follow-up lactic acid and cultures  -Paroxysmal atrial fibrillation Continue rate control meds Continue anticoagulation with Eliquis  -Hyperlipidemia Continue statin medication  -Type 2 diabetes mellitus Diabetic diet with sliding scale coverage with insulin  -DVT prophylaxis On anticoagulation with Eliquis  -Acute hypokalemia Replace potassium  All the records are reviewed and case discussed with Care Management/Social Worker. Management plans discussed with the patient, family and they are in agreement.  CODE STATUS:   DVT Prophylaxis: SCDs  TOTAL TIME TAKING CARE OF THIS PATIENT: 35 minutes.   POSSIBLE D/C IN 2 to 3 DAYS, DEPENDING ON CLINICAL CONDITION.  Saundra Shelling M.D on 05/24/2019 at 11:27 AM  Between 7am to 6pm - Pager - [phone]  After 6pm go to [email or web address] - password [email or web address] [facility]  Office  [phone]  CC: Primary care physician; [name], MD  Note: This dictation was prepared with Dragon dictation along with smaller phrase technology. Any transcriptional errors that result from this process are unintentional.

## 2019-05-24 NOTE — Consult Note (Signed)
Pharmacy Antibiotic Note  Scott Gross is a 85 y.o. male admitted on 05/23/2019 with fever and body aches.  Pharmacy has been consulted for vancomycin and cefepime dosing. for sepsis of unknown source.  WBC: 15.7   Tmax: 101.1    Plan: Vancomycin 1000mg  + 500mg  (total 1500mg ) loading dose given on admission. Start Vancomycin 1250 mg IV Q 24 hrs. Goal AUC 400-550. Expected AUC: 475 SCr used: 0.8  Start cefepime 2g IV every 8 hours    Height: 5\' 10"  (177.8 cm) Weight: 140 lb (63.5 kg) IBW/kg (Calculated) : 73  Temp (24hrs), Avg:99.8 F (37.7 C), Min:99.3 F (37.4 C), Max:100.2 F (37.9 C)  Recent Labs  Lab 05/18/19 1413 05/20/19 1154 05/21/19 0444 05/23/19 1853 05/23/19 2207  WBC 8.4 13.0* 10.0 15.7*  --   CREATININE 0.92 1.22 0.61 0.71  --   LATICACIDVEN  --   --   --  1.8 1.5    Estimated Creatinine Clearance: 63.9 mL/min (by C-G formula based on SCr of 0.71 mg/dL).    Allergies  Allergen Reactions  . Statins Other (See Comments)    Arthralagia    Antimicrobials this admission: 8/8 cefepime >>  8/8 Vancomycin  >>   Dose adjustments this admission:  Microbiology results: 8/8 BCx: pending 8/8 UCx: pending 8/8 SARS Coronavirus 2: negative  Thank you for allowing pharmacy to be a part of this patient's care.  Pernell Dupre, PharmD, BCPS Clinical Pharmacist 05/24/2019 12:37 AM

## 2019-05-24 NOTE — Progress Notes (Signed)
Advanced care plan. Purpose of the Encounter: CODE STATUS Parties in Attendance: Patient Patient's Decision Capacity: Good Subjective/Patient's story: Scott Gross  is a 85 y.o. male who presents with chief complaint as above.  Patient was just admitted here earlier this week for new onset A. fib with RVR.  Since returning back home he has developed malaise, weakness, fever at home.  He was brought back for evaluation.  Here tonight he meets sirs criteria, though clear infectious source is not evident.  Some suspicion for UTI.  Objective/Medical story Patient needs work-up for SIRS Needs IV antibiotics and fluids Needs potassium replacement Goals of care determination:  Advance care directives and goals of care discussed Patient wants everything done which includes cpr and intubation and ventilator if the needs arises. CODE STATUS: Full code Time spent discussing advanced care planning: 16 minutes

## 2019-05-25 ENCOUNTER — Ambulatory Visit: Payer: Medicare HMO

## 2019-05-25 LAB — BASIC METABOLIC PANEL
Anion gap: 9 (ref 5–15)
BUN: 12 mg/dL (ref 8–23)
CO2: 22 mmol/L (ref 22–32)
Calcium: 7.9 mg/dL — ABNORMAL LOW (ref 8.9–10.3)
Chloride: 98 mmol/L (ref 98–111)
Creatinine, Ser: 0.65 mg/dL (ref 0.61–1.24)
GFR calc Af Amer: >60 mL/min (ref >60)
GFR calc non Af Amer: >60 mL/min (ref >60)
Glucose, Bld: 150 mg/dL — ABNORMAL HIGH (ref 70–99)
Potassium: 3.4 mmol/L — ABNORMAL LOW (ref 3.5–5.1)
Sodium: 129 mmol/L — ABNORMAL LOW (ref 135–145)

## 2019-05-25 LAB — CBC
HCT: 34.4 % — ABNORMAL LOW (ref 39.0–52.0)
Hemoglobin: 11.8 g/dL — ABNORMAL LOW (ref 13.0–17.0)
MCH: 28.9 pg (ref 26.0–34.0)
MCHC: 34.3 g/dL (ref 30.0–36.0)
MCV: 84.3 fL (ref 80.0–100.0)
Platelets: 237 10*3/uL (ref 150–400)
RBC: 4.08 MIL/uL — ABNORMAL LOW (ref 4.22–5.81)
RDW: 12.8 % (ref 11.5–15.5)
WBC: 12.7 10*3/uL — ABNORMAL HIGH (ref 4.0–10.5)
nRBC: 0 % (ref 0.0–0.2)

## 2019-05-25 LAB — HEMOGLOBIN A1C
Hgb A1c MFr Bld: 6.2 % — ABNORMAL HIGH (ref 4.8–5.6)
Mean Plasma Glucose: 131 mg/dL

## 2019-05-25 LAB — GLUCOSE, CAPILLARY: Glucose-Capillary: 146 mg/dL — ABNORMAL HIGH (ref 70–99)

## 2019-05-25 LAB — URINE CULTURE: Culture: NO GROWTH

## 2019-05-25 MED ORDER — AMOXICILLIN-POT CLAVULANATE 875-125 MG PO TABS: 1.0000 | ORAL_TABLET | Freq: Two times a day (BID) | ORAL | 0 refills | Status: AC

## 2019-05-25 MED ORDER — POTASSIUM CHLORIDE CRYS ER 20 MEQ PO TBCR
40.0000 meq | EXTENDED_RELEASE_TABLET | Freq: Once | ORAL | Status: AC
  Administered 2019-05-25: 40 meq via ORAL
  Filled 2019-05-25: qty 2

## 2019-05-25 NOTE — Progress Notes (Signed)
Discharge instructions given and went over with patient and patients wife at bedside. All questions answered. Patient discharged home. Madlyn Frankel, RN

## 2019-05-25 NOTE — Discharge Summary (Signed)
[facility] NAME: Scott Gross    MR#:  [id number]  DATE OF BIRTH:  [birth year]  DATE OF ADMISSION:  05/23/2019 ADMITTING PHYSICIAN: Lance Coon, MD  DATE OF DISCHARGE: 05/25/2019  PRIMARY CARE PHYSICIAN: [name], MD   ADMISSION DIAGNOSIS:  Fever  DISCHARGE DIAGNOSIS:  Principal Problem:   SIRS (systemic inflammatory response syndrome) (HCC) Active Problems:   CAD (coronary artery disease)   Hyperlipidemia   Hypertension   Diabetes mellitus type 2, uncomplicated (HCC)   PAF (paroxysmal atrial fibrillation) ([place])   SECONDARY DIAGNOSIS:   Past Medical History:  Diagnosis Date  . Arthritis   . CAD (coronary artery disease) Nov. 12, 2012   Inferior ST elevation MI in November of 2012 with ventricular fibrillation arrest. Status post drug-eluting stent placement to the mid LAD. Most recent cardiac catheterization in June of this year showed patent stent with minimal restenosis, 75% proximal LAD and 80% distal LAD which were unchanged from before. Mild disease in the left circumflex and moderate RCA disease. Ejection fraction was 55%.  . Cancer of upper lobe of left lung ([place]) 11/2016   Rad tx's.  . Cardiac arrest [facility]) Nov. 2012   x 2   . Cardiac arrest - ventricular fibrillation   . HOH (hard of hearing)    Left Hearing Aid  . Hyperlipidemia   . Hypertension   . Post PTCA 2013   x2  . ST elevation MI (STEMI) ([place]) 08/26/2011     ADMITTING HISTORY Scott Gross  is a 85 y.o. male who presents with chief complaint as above.  Patient was just admitted here earlier this week for new onset A. fib with RVR.  Since returning back home he has developed malaise, weakness, fever at home.  He was brought back for evaluation.  Here tonight he meets sirs criteria, though clear infectious source is not evident.  Some suspicion for UTI.   HOSPITAL COURSE:  Patient was admitted to medical floor for systemic inflammatory response  syndrome.  Patient was started on broad-spectrum antibiotics with vancomycin and cefepime and received IV fluids.  Cultures did not reveal any growth.  Leukocytosis improved.  Patient tolerated diet well.  Patient continued anticoagulation with Eliquis for atrial fibrillation.  He will be discharged home on oral Augmentin antibiotic.  Patient follows up for radiation therapy for his lung cancer.  CONSULTS OBTAINED:    DRUG ALLERGIES:   Allergies  Allergen Reactions  . Statins Other (See Comments)    Arthralagia    DISCHARGE MEDICATIONS:   Allergies as of 05/25/2019      Reactions   Statins Other (See Comments)   Arthralagia      Medication List    TAKE these medications   amiodarone 200 MG tablet Commonly known as: PACERONE Take 1 tablet (200 mg total) by mouth 2 (two) times daily.   amoxicillin-clavulanate 875-125 MG tablet Commonly known as: Augmentin Take 1 tablet by mouth 2 (two) times daily for 3 days.   apixaban 5 MG Tabs tablet Commonly known as: ELIQUIS Take 1 tablet (5 mg total) by mouth 2 (two) times daily.   diltiazem 180 MG 24 hr capsule Commonly known as: CARDIZEM CD Take 1 capsule (180 mg total) by mouth daily.   furosemide 20 MG tablet Commonly known as: LASIX Take 20 mg by mouth daily as needed for edema.       Today  Patient seen today No fever No shortness of breath  Tolerating diet well Hemodynamically stable VITAL SIGNS:  Blood pressure (!) 144/75, pulse 90, temperature 98.8 F (37.1 C), temperature source Oral, resp. rate 18, height 5\' 10"  (1.778 m), weight 63.5 kg, SpO2 97 %.  I/O:    Intake/Output Summary (Last 24 hours) at 05/25/2019 1106 Last data filed at 05/25/2019 0900 Gross per 24 hour  Intake 811.74 ml  Output 1450 ml  Net -638.26 ml    PHYSICAL EXAMINATION:  Physical Exam  GENERAL:  85 y.o.-year-old patient lying in the bed with no acute distress.  LUNGS: Normal breath sounds bilaterally, no wheezing, rales,rhonchi or  crepitation. No use of accessory muscles of respiration.  CARDIOVASCULAR: S1, S2 normal. No murmurs, rubs, or gallops.  ABDOMEN: Soft, non-tender, non-distended. Bowel sounds present. No organomegaly or mass.  NEUROLOGIC: Moves all 4 extremities. PSYCHIATRIC: The patient is alert and oriented x 3.  SKIN: No obvious rash, lesion, or ulcer.   DATA REVIEW:   CBC Recent Labs  Lab 05/25/19 0545  WBC 12.7*  HGB 11.8*  HCT 34.4*  PLT 237    Chemistries  Recent Labs  Lab 05/21/19 0444 05/23/19 1853  05/25/19 0545  NA 133* 126*   < > 129*  K 3.2* 3.2*   < > 3.4*  CL 101 92*   < > 98  CO2 25 24   < > 22  GLUCOSE 130* 209*   < > 150*  BUN 8 10   < > 12  CREATININE 0.61 0.71   < > 0.65  CALCIUM 7.9* 7.9*   < > 7.9*  MG 1.8  --   --   --   AST  --  17  --   --   ALT  --  14  --   --   ALKPHOS  --  78  --   --   BILITOT  --  1.1  --   --    < > = values in this interval not displayed.    Cardiac Enzymes No results for input(s): TROPONINI in the last 168 hours.  Microbiology Results  Results for orders placed or performed during the hospital encounter of 05/23/19  Culture, blood (Routine x 2)     Status: None (Preliminary result)   Collection Time: 05/23/19  6:52 PM   Specimen: BLOOD  Result Value Ref Range Status   Specimen Description BLOOD BLOOD RIGHT FOREARM  Final   Special Requests   Final    BOTTLES DRAWN AEROBIC AND ANAEROBIC Blood Culture adequate volume   Culture   Final    NO GROWTH 2 DAYS Performed at [facility], [address], [place], [place] [address]    Report Status PENDING  Incomplete  SARS Coronavirus 2 [facility] order, Performed in [place] hospital lab) Nasopharyngeal Nasopharyngeal Swab     Status: None   Collection Time: 05/23/19  8:27 PM   Specimen: Nasopharyngeal Swab  Result Value Ref Range Status   SARS Coronavirus 2 NEGATIVE NEGATIVE Final    Comment: (NOTE) If result is NEGATIVE SARS-CoV-2 target nucleic acids are NOT  DETECTED. The SARS-CoV-2 RNA is generally detectable in upper and lower  respiratory specimens during the acute phase of infection. The lowest  concentration of SARS-CoV-2 viral copies this assay can detect is 250  copies / mL. A negative result does not preclude SARS-CoV-2 infection  and should not be used as the sole basis for treatment or other  patient management decisions.  A negative result may occur  with  improper specimen collection / handling, submission of specimen other  than nasopharyngeal swab, presence of viral mutation(s) within the  areas targeted by this assay, and inadequate number of viral copies  (<250 copies / mL). A negative result must be combined with clinical  observations, patient history, and epidemiological information. If result is POSITIVE SARS-CoV-2 target nucleic acids are DETECTED. The SARS-CoV-2 RNA is generally detectable in upper and lower  respiratory specimens dur ing the acute phase of infection.  Positive  results are indicative of active infection with SARS-CoV-2.  Clinical  correlation with patient history and other diagnostic information is  necessary to determine patient infection status.  Positive results do  not rule out bacterial infection or co-infection with other viruses. If result is PRESUMPTIVE POSTIVE SARS-CoV-2 nucleic acids MAY BE PRESENT.   A presumptive positive result was obtained on the submitted specimen  and confirmed on repeat testing.  While 2019 novel coronavirus  (SARS-CoV-2) nucleic acids may be present in the submitted sample  additional confirmatory testing may be necessary for epidemiological  and / or clinical management purposes  to differentiate between  SARS-CoV-2 and other Sarbecovirus currently known to infect humans.  If clinically indicated additional testing with an alternate test  methodology [phone]) is advised. The SARS-CoV-2 RNA is generally  detectable in upper and lower respiratory sp ecimens during  the acute  phase of infection. The expected result is Negative. Fact Sheet for Patients:  StrictlyIdeas.no Fact Sheet for Healthcare Providers: BankingDealers.co.za This test is not yet approved or cleared by the [place] FDA and has been authorized for detection and/or diagnosis of SARS-CoV-2 by FDA under an Emergency Use Authorization (EUA).  This EUA will remain in effect (meaning this test can be used) for the duration of the COVID-19 declaration under Section 564(b)(1) of the Act, 21 U.S.C. section 360bbb-3(b)(1), unless the authorization is terminated or revoked sooner. Performed at [facility], [address] [place], [place] [address]   Culture, blood (Routine x 2)     Status: None (Preliminary result)   Collection Time: 05/23/19  9:21 PM   Specimen: BLOOD  Result Value Ref Range Status   Specimen Description BLOOD RIGHT ANTECUBITAL  Final   Special Requests   Final    BOTTLES DRAWN AEROBIC AND ANAEROBIC Blood Culture adequate volume   Culture   Final    NO GROWTH 2 DAYS Performed at [facility], [address], [place], [place] [address]    Report Status PENDING  Incomplete  Urine culture     Status: None   Collection Time: 05/23/19 10:07 PM   Specimen: Urine, Random  Result Value Ref Range Status   Specimen Description   Final    URINE, RANDOM Performed at [facility], [address] [place], [place] [address]    Special Requests   Final    NONE Performed at [facility], [address] [place], [place] [address]    Culture   Final    NO GROWTH Performed at [facility], [place] [address], [place], [place] [address]    Report Status 05/25/2019 FINAL  Final    RADIOLOGY:  Dg Chest 2 View  Result Date: 05/23/2019 CLINICAL DATA:  Suspected sepsis, known history of left upper lobe lung carcinoma EXAM: CHEST - 2 VIEW COMPARISON:  04/21/2019 FINDINGS: Cardiac  shadows within normal limits. Stable left upper lobe mass lesion is noted with fiducial markers in place. The lungs are otherwise clear.  No bony abnormality is noted. IMPRESSION: Left upper lobe mass lesion consistent with the given clinical history. No acute abnormality noted. Electronically Signed   By: Inez Catalina M.D.   On: 05/23/2019 19:23    Follow up with PCP in 1 week.  Management plans discussed with the patient, family and they are in agreement.  CODE STATUS: Full code    Code Status Orders  (From admission, onward)         Start     Ordered   05/23/19 2341  Full code  Continuous     05/23/19 2340        Code Status History    Date Active Date Inactive Code Status Order ID Comments User Context   05/20/2019 1449 05/21/2019 1727 Full Code [id number]  Saundra Shelling, MD ED   Advance Care Planning Activity      TOTAL TIME TAKING CARE OF THIS PATIENT ON DAY OF DISCHARGE: more than 35 minutes.   Saundra Shelling M.D on 05/25/2019 at 11:06 AM  Between 7am to 6pm - Pager - [phone]  After 6pm go to [email or web address] - password [email or web address] [facility]  Office  [phone]  CC: Primary care physician; [name], MD  Note: This dictation was prepared with Dragon dictation along with smaller phrase technology. Any transcriptional errors that result from this process are unintentional.

## 2019-05-26 ENCOUNTER — Ambulatory Visit
Admission: RE | Admit: 2019-05-26 | Discharge: 2019-05-26 | Disposition: A | Discharge status: Home / Self Care | Payer: Medicare HMO | Source: Ambulatory Visit | Attending: Radiation Oncology | Admitting: Radiation Oncology

## 2019-05-26 ENCOUNTER — Other Ambulatory Visit: Payer: Self-pay

## 2019-05-26 DIAGNOSIS — C3412 Malignant neoplasm of upper lobe, left bronchus or lung: Secondary | ICD-10-CM | POA: Diagnosis not present

## 2019-05-26 DIAGNOSIS — Z87891 Personal history of nicotine dependence: Secondary | ICD-10-CM | POA: Diagnosis not present

## 2019-05-26 DIAGNOSIS — R600 Localized edema: Secondary | ICD-10-CM | POA: Diagnosis not present

## 2019-05-26 DIAGNOSIS — I4891 Unspecified atrial fibrillation: Secondary | ICD-10-CM | POA: Diagnosis not present

## 2019-05-26 DIAGNOSIS — Z510 Encounter for antineoplastic radiation therapy: Secondary | ICD-10-CM | POA: Diagnosis not present

## 2019-05-27 ENCOUNTER — Ambulatory Visit: Payer: Medicare HMO

## 2019-05-27 ENCOUNTER — Ambulatory Visit
Admission: RE | Admit: 2019-05-27 | Discharge: 2019-05-27 | Disposition: A | Discharge status: Home / Self Care | Payer: Medicare HMO | Source: Ambulatory Visit | Attending: Radiation Oncology | Admitting: Radiation Oncology

## 2019-05-27 ENCOUNTER — Other Ambulatory Visit: Payer: Self-pay

## 2019-05-27 DIAGNOSIS — C3412 Malignant neoplasm of upper lobe, left bronchus or lung: Secondary | ICD-10-CM | POA: Diagnosis not present

## 2019-05-27 DIAGNOSIS — Z87891 Personal history of nicotine dependence: Secondary | ICD-10-CM | POA: Diagnosis not present

## 2019-05-27 DIAGNOSIS — Z510 Encounter for antineoplastic radiation therapy: Secondary | ICD-10-CM | POA: Diagnosis not present

## 2019-05-28 ENCOUNTER — Ambulatory Visit
Admission: RE | Admit: 2019-05-28 | Discharge: 2019-05-28 | Disposition: A | Discharge status: Home / Self Care | Payer: Medicare HMO | Source: Ambulatory Visit | Attending: Radiation Oncology | Admitting: Radiation Oncology

## 2019-05-28 ENCOUNTER — Other Ambulatory Visit: Payer: Self-pay

## 2019-05-28 DIAGNOSIS — I739 Peripheral vascular disease, unspecified: Secondary | ICD-10-CM | POA: Diagnosis not present

## 2019-05-28 DIAGNOSIS — I701 Atherosclerosis of renal artery: Secondary | ICD-10-CM | POA: Diagnosis not present

## 2019-05-28 DIAGNOSIS — R600 Localized edema: Secondary | ICD-10-CM | POA: Diagnosis not present

## 2019-05-28 DIAGNOSIS — R079 Chest pain, unspecified: Secondary | ICD-10-CM | POA: Diagnosis not present

## 2019-05-28 DIAGNOSIS — R55 Syncope and collapse: Secondary | ICD-10-CM | POA: Diagnosis not present

## 2019-05-28 DIAGNOSIS — C3412 Malignant neoplasm of upper lobe, left bronchus or lung: Secondary | ICD-10-CM | POA: Diagnosis not present

## 2019-05-28 DIAGNOSIS — I10 Essential (primary) hypertension: Secondary | ICD-10-CM | POA: Diagnosis not present

## 2019-05-28 DIAGNOSIS — I208 Other forms of angina pectoris: Secondary | ICD-10-CM | POA: Diagnosis not present

## 2019-05-28 DIAGNOSIS — I480 Paroxysmal atrial fibrillation: Secondary | ICD-10-CM | POA: Diagnosis not present

## 2019-05-28 DIAGNOSIS — R000 Tachycardia, unspecified: Secondary | ICD-10-CM | POA: Diagnosis not present

## 2019-05-28 DIAGNOSIS — Z87891 Personal history of nicotine dependence: Secondary | ICD-10-CM | POA: Diagnosis not present

## 2019-05-28 DIAGNOSIS — Z510 Encounter for antineoplastic radiation therapy: Secondary | ICD-10-CM | POA: Diagnosis not present

## 2019-05-28 LAB — CULTURE, BLOOD (ROUTINE X 2)
Culture: NO GROWTH
Culture: NO GROWTH
Special Requests: ADEQUATE
Special Requests: ADEQUATE

## 2019-06-08 LAB — NOVEL CORONAVIRUS, NAA (HOSP ORDER, SEND-OUT TO REF LAB; TAT 18-24 HRS)

## 2019-06-16 DIAGNOSIS — R630 Anorexia: Secondary | ICD-10-CM | POA: Diagnosis not present

## 2019-06-16 DIAGNOSIS — R5383 Other fatigue: Secondary | ICD-10-CM | POA: Diagnosis not present

## 2019-06-16 DIAGNOSIS — I10 Essential (primary) hypertension: Secondary | ICD-10-CM | POA: Diagnosis not present

## 2019-06-16 DIAGNOSIS — R509 Fever, unspecified: Secondary | ICD-10-CM | POA: Diagnosis not present

## 2019-06-16 DIAGNOSIS — R5381 Other malaise: Secondary | ICD-10-CM | POA: Diagnosis not present

## 2019-06-17 DIAGNOSIS — R079 Chest pain, unspecified: Secondary | ICD-10-CM | POA: Diagnosis not present

## 2019-06-20 ENCOUNTER — Inpatient Hospital Stay
Admission: EM | Admit: 2019-06-20 | Discharge: 2019-06-22 | DRG: 871 | Disposition: A | Discharge status: Home / Self Care | Payer: Medicare Other | Attending: Internal Medicine | Admitting: Internal Medicine

## 2019-06-20 ENCOUNTER — Emergency Department: Payer: Medicare Other

## 2019-06-20 ENCOUNTER — Other Ambulatory Visit: Payer: Self-pay

## 2019-06-20 DIAGNOSIS — Z8674 Personal history of sudden cardiac arrest: Secondary | ICD-10-CM | POA: Diagnosis not present

## 2019-06-20 DIAGNOSIS — C3412 Malignant neoplasm of upper lobe, left bronchus or lung: Secondary | ICD-10-CM | POA: Diagnosis not present

## 2019-06-20 DIAGNOSIS — H9190 Unspecified hearing loss, unspecified ear: Secondary | ICD-10-CM | POA: Diagnosis present

## 2019-06-20 DIAGNOSIS — R918 Other nonspecific abnormal finding of lung field: Secondary | ICD-10-CM | POA: Diagnosis not present

## 2019-06-20 DIAGNOSIS — Z923 Personal history of irradiation: Secondary | ICD-10-CM

## 2019-06-20 DIAGNOSIS — Z888 Allergy status to other drugs, medicaments and biological substances status: Secondary | ICD-10-CM

## 2019-06-20 DIAGNOSIS — W19XXXA Unspecified fall, initial encounter: Secondary | ICD-10-CM | POA: Diagnosis not present

## 2019-06-20 DIAGNOSIS — Z79899 Other long term (current) drug therapy: Secondary | ICD-10-CM | POA: Diagnosis not present

## 2019-06-20 DIAGNOSIS — I482 Chronic atrial fibrillation, unspecified: Secondary | ICD-10-CM | POA: Diagnosis not present

## 2019-06-20 DIAGNOSIS — I10 Essential (primary) hypertension: Secondary | ICD-10-CM | POA: Diagnosis not present

## 2019-06-20 DIAGNOSIS — Z20828 Contact with and (suspected) exposure to other viral communicable diseases: Secondary | ICD-10-CM | POA: Diagnosis present

## 2019-06-20 DIAGNOSIS — E118 Type 2 diabetes mellitus with unspecified complications: Secondary | ICD-10-CM | POA: Diagnosis present

## 2019-06-20 DIAGNOSIS — Z8249 Family history of ischemic heart disease and other diseases of the circulatory system: Secondary | ICD-10-CM

## 2019-06-20 DIAGNOSIS — R42 Dizziness and giddiness: Secondary | ICD-10-CM | POA: Diagnosis not present

## 2019-06-20 DIAGNOSIS — E785 Hyperlipidemia, unspecified: Secondary | ICD-10-CM | POA: Diagnosis present

## 2019-06-20 DIAGNOSIS — E876 Hypokalemia: Secondary | ICD-10-CM | POA: Diagnosis not present

## 2019-06-20 DIAGNOSIS — E440 Moderate protein-calorie malnutrition: Secondary | ICD-10-CM | POA: Insufficient documentation

## 2019-06-20 DIAGNOSIS — I252 Old myocardial infarction: Secondary | ICD-10-CM | POA: Diagnosis not present

## 2019-06-20 DIAGNOSIS — M1990 Unspecified osteoarthritis, unspecified site: Secondary | ICD-10-CM | POA: Diagnosis present

## 2019-06-20 DIAGNOSIS — Z87891 Personal history of nicotine dependence: Secondary | ICD-10-CM | POA: Diagnosis not present

## 2019-06-20 DIAGNOSIS — A419 Sepsis, unspecified organism: Principal | ICD-10-CM | POA: Diagnosis present

## 2019-06-20 DIAGNOSIS — I2510 Atherosclerotic heart disease of native coronary artery without angina pectoris: Secondary | ICD-10-CM | POA: Diagnosis not present

## 2019-06-20 DIAGNOSIS — Z7901 Long term (current) use of anticoagulants: Secondary | ICD-10-CM

## 2019-06-20 DIAGNOSIS — I480 Paroxysmal atrial fibrillation: Secondary | ICD-10-CM | POA: Diagnosis present

## 2019-06-20 DIAGNOSIS — J189 Pneumonia, unspecified organism: Secondary | ICD-10-CM | POA: Diagnosis not present

## 2019-06-20 DIAGNOSIS — Z9861 Coronary angioplasty status: Secondary | ICD-10-CM

## 2019-06-20 NOTE — ED Triage Notes (Addendum)
Pt to ED via EMS from home. Per ems pt had witnessed near syncopal episode while standing, pt states he never had LOC. Pt had confusion and loss of bladder function durign dizzy spell. Pt states this has happened in the past, dizziness lasted for few minutes. Pt denies pain or dizziness now. VSS

## 2019-06-20 NOTE — ED Provider Notes (Signed)
[facility] Emergency Department Provider Note    First MD Initiated Contact with Patient 06/20/19 2356     (approximate)  I have reviewed the triage vital signs and the nursing notes.   HISTORY  Chief Complaint Dizziness    HPI Scott Gross is a 85 y.o. male with below listed previous medical conditions including currently receiving treatment for lung cancer presents to the emergency department via EMS secondary to a witnessed syncopal episode while standing.  Patient states that he did not lose consciousness however patient did become acutely dizzy and confused with loss of bladder function per his daughter.  Patient denied any preceding symptoms.  Patient denies any chest pain or shortness of breath no weakness numbness gait instability nausea vomiting headache chest pain.  Patient states "I feel fine".  Patient denies any recent vomiting or diarrhea.          Past Medical History:  Diagnosis Date  . Arthritis   . CAD (coronary artery disease) Nov. 12, 2012   Inferior ST elevation MI in November of 2012 with ventricular fibrillation arrest. Status post drug-eluting stent placement to the mid LAD. Most recent cardiac catheterization in June of this year showed patent stent with minimal restenosis, 75% proximal LAD and 80% distal LAD which were unchanged from before. Mild disease in the left circumflex and moderate RCA disease. Ejection fraction was 55%.  . Cancer of upper lobe of left lung ([place]) 11/2016   Rad tx's.  . Cardiac arrest [place]) Nov. 2012   x 2   . Cardiac arrest - ventricular fibrillation   . HOH (hard of hearing)    Left Hearing Aid  . Hyperlipidemia   . Hypertension   . Post PTCA 2013   x2  . ST elevation MI (STEMI) ([place]) 08/26/2011    Patient Active Problem List   Diagnosis Date Noted  . SIRS (systemic inflammatory response syndrome) ([place]) 05/23/2019  . Atrial fibrillation with RVR ([place])   . PAF (paroxysmal atrial  fibrillation) ([place]) 05/20/2019  . Cardiac arrest ([place])   . Diabetes mellitus type 2, uncomplicated ([place]) 31/54/0086  . Angina pectoris ([place]) 12/17/2016  . Adenopathy   . Cancer of upper lobe of left lung ([place]) 09/10/2016  . Hyperlipidemia   . Hypertension   . CAD (coronary artery disease) 08/27/2011  . Myocardial infarction ([place]) 08/16/2011    Past Surgical History:  Procedure Laterality Date  . CARDIAC CATHETERIZATION  2013   @ [place]; [facility] s/p stent   . CORONARY ANGIOPLASTY    . ELECTROMAGNETIC NAVIGATION BROCHOSCOPY N/A 11/27/2016   Procedure: ELECTROMAGNETIC NAVIGATION BRONCHOSCOPY;  Surgeon: [name], MD;  Location: [facility] ORS;  Service: Cardiopulmonary;  Laterality: N/A;    Prior to Admission medications   Medication Sig Start Date End Date Taking? Authorizing Provider  amiodarone (PACERONE) 200 MG tablet Take 1 tablet (200 mg total) by mouth 2 (two) times daily. 05/21/19   Bettey Costa, MD  apixaban (ELIQUIS) 5 MG TABS tablet Take 1 tablet (5 mg total) by mouth 2 (two) times daily. 05/21/19   Bettey Costa, MD  diltiazem (CARDIZEM CD) 180 MG 24 hr capsule Take 1 capsule (180 mg total) by mouth daily. 05/21/19   Bettey Costa, MD  furosemide (LASIX) 20 MG tablet Take 20 mg by mouth daily as needed for edema.  05/14/19 05/13/20  [provider]    Allergies Statins  Family History  Problem Relation Age of Onset  . Heart attack Mother   .  Heart attack Father     Social History Social History   Tobacco Use  . Smoking status: Former Smoker    Packs/day: 1.00    Types: Cigarettes    Quit date: 08/27/2011    Years since quitting: 7.8  . Smokeless tobacco: Never Used  Substance Use Topics  . Alcohol use: No  . Drug use: No    Review of Systems Constitutional: No fever/chills Eyes: No visual changes. ENT: No sore throat. Cardiovascular: Denies chest pain. Respiratory: Denies shortness of breath. Gastrointestinal: No abdominal pain.  No nausea, no vomiting.  No  diarrhea.  No constipation. Genitourinary: Negative for dysuria. Musculoskeletal: Negative for neck pain.  Negative for back pain. Integumentary: Negative for rash. Neurological: Negative for headaches, focal weakness or numbness.  Positive for syncopal episode ____________________________________________   PHYSICAL EXAM:  VITAL SIGNS: ED Triage Vitals  Enc Vitals Group     BP 06/20/19 2339 139/87     Pulse Rate 06/20/19 2339 77     Resp 06/20/19 2339 12     Temp 06/20/19 2339 98.2 F (36.8 C)     Temp Source 06/20/19 2339 Oral     SpO2 06/20/19 2339 97 %     Weight 06/20/19 2342 62.6 kg (138 lb)     Height 06/20/19 2342 1.803 m (5\' 11" )     Head Circumference --      Peak Flow --      Pain Score 06/20/19 2340 0     Pain Loc --      Pain Edu? --      Excl. in [place]? --     Constitutional: Alert and oriented.  Periods of confusion Eyes: Conjunctivae are normal.  Head: Atraumatic. Mouth/Throat: Mucous membranes are moist. Neck: No stridor.  No meningeal signs.   Cardiovascular: Normal rate, regular rhythm. Good peripheral circulation. Grossly normal heart sounds. Respiratory: Normal respiratory effort.  No retractions. Gastrointestinal: Soft and nontender. No distention.  Musculoskeletal: No lower extremity tenderness nor edema. No gross deformities of extremities. Neurologic:  Normal speech and language. No gross focal neurologic deficits are appreciated.  Skin:  Skin is warm, dry and intact. Psychiatric: Mood and affect are normal. Speech and behavior are normal.  ____________________________________________   LABS (all labs ordered are listed, but only abnormal results are displayed)  Labs Reviewed  BASIC METABOLIC PANEL - Abnormal; Notable for the following components:      Result Value   Sodium 134 (*)    Potassium 2.8 (*)    Chloride 95 (*)    Glucose, Bld 160 (*)    Calcium 8.3 (*)    All other components within normal limits  CBC - Abnormal; Notable for the  following components:   WBC 13.2 (*)    Hemoglobin 12.3 (*)    HCT 36.4 (*)    All other components within normal limits  URINALYSIS, COMPLETE (UACMP) WITH MICROSCOPIC - Abnormal; Notable for the following components:   Color, Urine STRAW (*)    APPearance CLEAR (*)    All other components within normal limits  CULTURE, BLOOD (ROUTINE X 2)  CULTURE, BLOOD (ROUTINE X 2)  URINE CULTURE  LACTIC ACID, PLASMA  LACTIC ACID, PLASMA  TROPONIN I (HIGH SENSITIVITY)  TROPONIN I (HIGH SENSITIVITY)   ____________________________________________  EKG  ED ECG REPORT I, [place] Karem Farha, the attending physician, personally viewed and interpreted this ECG.   Date: 06/21/2019  EKG Time: 11:39 PM  Rate: 71  Rhythm: Normal sinus rhythm  Axis: Normal  Intervals: Normal  ST&T Change: None  ____________________________________________  RADIOLOGY I, [place] N Annali Lybrand, personally viewed and evaluated these images (plain radiographs) as part of my medical decision making, as well as reviewing the written report by the radiologist.  ED MD interpretation:    Official radiology report(s): Dg Chest Port 1 View  Result Date: 06/21/2019 CLINICAL DATA:  Dizziness EXAM: PORTABLE CHEST 1 VIEW COMPARISON:  May 23, 2019 FINDINGS: There is a left upper pulmonary mass as on prior exams. There appears to be subtly increased hazy airspace opacity adjacent to the mass. The right lung remains clear. There is hyperinflation of the upper lung zones. The cardiomediastinal silhouette is unchanged. Surgical clips seen within the left upper hemithorax. IMPRESSION: Left upper lung mass with question of mildly increased surrounding hazy airspace opacity. This could be due to adjacent atelectasis or early infectious etiology. Electronically Signed   By: Prudencio Pair M.D.   On: 06/21/2019 00:19     Procedures   ____________________________________________   INITIAL IMPRESSION / MDM / ASSESSMENT AND PLAN / ED COURSE   As part of my medical decision making, I reviewed the following data within the [place] NUMBER   85 year old male presenting with above-stated history and physical exam secondary to syncopal episode.  Patient noted to to have abnormal orthostatic vital signs.  Patient's daughter presented to the emergency department and states that he has been having temperatures as high as 102 for the past few nights.  She also states that his oxygen saturation has been low with sats as low as 92%.  Patient's daughter also tested the fact that he has had episodes of confusion.  Concern for possible infectious etiology given reported history of fever.  Suspect possible postobstructive pneumonia as potential etiology and assess chest x-ray was performed which revealed evidence of possible pneumonia.  Patient given 1 L IV normal saline emergency department, in addition patient given IV ceftriaxone and azithromycin.  Patient discussed with Dr. Jannifer Franklin for hospital admission for further evaluation and management. ____________________________________________  FINAL CLINICAL IMPRESSION(S) / ED DIAGNOSES  Final diagnoses:  Community acquired pneumonia, unspecified laterality     MEDICATIONS GIVEN DURING THIS VISIT:  Medications  cefTRIAXone (ROCEPHIN) 2 g in sodium chloride 0.9 % 100 mL IVPB (2 g Intravenous New Bag/Given 06/21/19 0101)  azithromycin (ZITHROMAX) 500 mg in sodium chloride 0.9 % 250 mL IVPB (has no administration in time range)  potassium chloride SA (K-DUR) CR tablet 40 mEq (has no administration in time range)     ED Discharge Orders    None      *Please note:  IOAN LANDINI was evaluated in Emergency Department on 06/21/2019 for the symptoms described in the history of present illness. He was evaluated in the context of the global COVID-19 pandemic, which necessitated consideration that the patient might be at risk for infection with the SARS-CoV-2 virus that causes COVID-19.  Institutional protocols and algorithms that pertain to the evaluation of patients at risk for COVID-19 are in a state of rapid change based on information released by regulatory bodies including the CDC and federal and state organizations. These policies and algorithms were followed during the patient's care in the ED.  Some ED evaluations and interventions may be delayed as a result of limited staffing during the pandemic.*  Note:  This document was prepared using Dragon voice recognition software and may include unintentional dictation errors.   Gregor Hams, MD 06/21/19 [phone]

## 2019-06-21 ENCOUNTER — Emergency Department: Payer: Medicare Other

## 2019-06-21 ENCOUNTER — Other Ambulatory Visit: Payer: Self-pay

## 2019-06-21 DIAGNOSIS — J189 Pneumonia, unspecified organism: Secondary | ICD-10-CM | POA: Diagnosis not present

## 2019-06-21 DIAGNOSIS — H9190 Unspecified hearing loss, unspecified ear: Secondary | ICD-10-CM | POA: Diagnosis not present

## 2019-06-21 DIAGNOSIS — E876 Hypokalemia: Secondary | ICD-10-CM | POA: Diagnosis not present

## 2019-06-21 DIAGNOSIS — Z8674 Personal history of sudden cardiac arrest: Secondary | ICD-10-CM | POA: Diagnosis not present

## 2019-06-21 DIAGNOSIS — Z8249 Family history of ischemic heart disease and other diseases of the circulatory system: Secondary | ICD-10-CM | POA: Diagnosis not present

## 2019-06-21 DIAGNOSIS — Z79899 Other long term (current) drug therapy: Secondary | ICD-10-CM | POA: Diagnosis not present

## 2019-06-21 DIAGNOSIS — I2510 Atherosclerotic heart disease of native coronary artery without angina pectoris: Secondary | ICD-10-CM | POA: Diagnosis not present

## 2019-06-21 DIAGNOSIS — E785 Hyperlipidemia, unspecified: Secondary | ICD-10-CM | POA: Diagnosis not present

## 2019-06-21 DIAGNOSIS — Z9861 Coronary angioplasty status: Secondary | ICD-10-CM | POA: Diagnosis not present

## 2019-06-21 DIAGNOSIS — C3490 Malignant neoplasm of unspecified part of unspecified bronchus or lung: Secondary | ICD-10-CM | POA: Diagnosis not present

## 2019-06-21 DIAGNOSIS — I10 Essential (primary) hypertension: Secondary | ICD-10-CM | POA: Diagnosis not present

## 2019-06-21 DIAGNOSIS — I482 Chronic atrial fibrillation, unspecified: Secondary | ICD-10-CM | POA: Diagnosis not present

## 2019-06-21 DIAGNOSIS — Z7901 Long term (current) use of anticoagulants: Secondary | ICD-10-CM | POA: Diagnosis not present

## 2019-06-21 DIAGNOSIS — R42 Dizziness and giddiness: Secondary | ICD-10-CM | POA: Diagnosis not present

## 2019-06-21 DIAGNOSIS — A419 Sepsis, unspecified organism: Secondary | ICD-10-CM | POA: Diagnosis not present

## 2019-06-21 DIAGNOSIS — Z87891 Personal history of nicotine dependence: Secondary | ICD-10-CM | POA: Diagnosis not present

## 2019-06-21 DIAGNOSIS — Z20828 Contact with and (suspected) exposure to other viral communicable diseases: Secondary | ICD-10-CM | POA: Diagnosis not present

## 2019-06-21 DIAGNOSIS — I252 Old myocardial infarction: Secondary | ICD-10-CM | POA: Diagnosis not present

## 2019-06-21 DIAGNOSIS — R918 Other nonspecific abnormal finding of lung field: Secondary | ICD-10-CM | POA: Diagnosis not present

## 2019-06-21 DIAGNOSIS — Z888 Allergy status to other drugs, medicaments and biological substances status: Secondary | ICD-10-CM | POA: Diagnosis not present

## 2019-06-21 DIAGNOSIS — C3412 Malignant neoplasm of upper lobe, left bronchus or lung: Secondary | ICD-10-CM | POA: Diagnosis not present

## 2019-06-21 LAB — TROPONIN I (HIGH SENSITIVITY): Troponin I (High Sensitivity): 8 ng/L (ref <18)

## 2019-06-21 LAB — LACTIC ACID, PLASMA: Lactic Acid, Venous: 1.4 mmol/L (ref 0.5–1.9)

## 2019-06-21 LAB — BASIC METABOLIC PANEL
Anion gap: 13 (ref 5–15)
BUN: 14 mg/dL (ref 8–23)
CO2: 26 mmol/L (ref 22–32)
Calcium: 8.3 mg/dL — ABNORMAL LOW (ref 8.9–10.3)
Chloride: 95 mmol/L — ABNORMAL LOW (ref 98–111)
Creatinine, Ser: 0.99 mg/dL (ref 0.61–1.24)
GFR calc Af Amer: >60 mL/min (ref >60)
GFR calc non Af Amer: >60 mL/min (ref >60)
Glucose, Bld: 160 mg/dL — ABNORMAL HIGH (ref 70–99)
Potassium: 2.8 mmol/L — ABNORMAL LOW (ref 3.5–5.1)
Sodium: 134 mmol/L — ABNORMAL LOW (ref 135–145)

## 2019-06-21 LAB — CBC
HCT: 36.4 % — ABNORMAL LOW (ref 39.0–52.0)
Hemoglobin: 12.3 g/dL — ABNORMAL LOW (ref 13.0–17.0)
MCH: 28.7 pg (ref 26.0–34.0)
MCHC: 33.8 g/dL (ref 30.0–36.0)
MCV: 84.8 fL (ref 80.0–100.0)
Platelets: 294 10*3/uL (ref 150–400)
RBC: 4.29 MIL/uL (ref 4.22–5.81)
RDW: 13.9 % (ref 11.5–15.5)
WBC: 13.2 10*3/uL — ABNORMAL HIGH (ref 4.0–10.5)
nRBC: 0 % (ref 0.0–0.2)

## 2019-06-21 LAB — URINALYSIS, COMPLETE (UACMP) WITH MICROSCOPIC
Bacteria, UA: NONE SEEN
Bilirubin Urine: NEGATIVE
Glucose, UA: NEGATIVE mg/dL
Hgb urine dipstick: NEGATIVE
Ketones, ur: NEGATIVE mg/dL
Leukocytes,Ua: NEGATIVE
Nitrite: NEGATIVE
Protein, ur: NEGATIVE mg/dL
Specific Gravity, Urine: 1.008 (ref 1.005–1.030)
Squamous Epithelial / HPF: NONE SEEN (ref 0–5)
pH: 7 (ref 5.0–8.0)

## 2019-06-21 LAB — POTASSIUM: Potassium: 3.9 mmol/L (ref 3.5–5.1)

## 2019-06-21 LAB — TSH: TSH: 2.971 u[IU]/mL (ref 0.350–4.500)

## 2019-06-21 LAB — MRSA PCR SCREENING: MRSA by PCR: NEGATIVE

## 2019-06-21 LAB — SARS CORONAVIRUS 2 BY RT PCR (HOSPITAL ORDER, PERFORMED IN ~~LOC~~ HOSPITAL LAB): SARS Coronavirus 2: NEGATIVE

## 2019-06-21 LAB — MAGNESIUM: Magnesium: 2.1 mg/dL (ref 1.7–2.4)

## 2019-06-21 MED ORDER — ACETAMINOPHEN 650 MG RE SUPP: 650.0000 mg | Freq: Four times a day (QID) | RECTAL | Status: DC | PRN

## 2019-06-21 MED ORDER — ACETAMINOPHEN 325 MG PO TABS: 650.0000 mg | ORAL_TABLET | Freq: Four times a day (QID) | ORAL | Status: DC | PRN

## 2019-06-21 MED ORDER — DOCUSATE SODIUM 100 MG PO CAPS
100.0000 mg | ORAL_CAPSULE | Freq: Two times a day (BID) | ORAL | Status: DC
  Administered 2019-06-21: 09:00:00 100 mg via ORAL
  Filled 2019-06-21 (×3): qty 1

## 2019-06-21 MED ORDER — ONDANSETRON HCL 4 MG/2ML IJ SOLN: 4.0000 mg | Freq: Four times a day (QID) | INTRAMUSCULAR | Status: DC | PRN

## 2019-06-21 MED ORDER — SODIUM CHLORIDE 0.9 % IV SOLN
2.0000 g | INTRAVENOUS | Status: DC
  Administered 2019-06-21 (×2): 2 g via INTRAVENOUS
  Filled 2019-06-21: qty 2
  Filled 2019-06-21 (×2): qty 20

## 2019-06-21 MED ORDER — POTASSIUM CHLORIDE IN NACL 20-0.9 MEQ/L-% IV SOLN
INTRAVENOUS | Status: DC
  Administered 2019-06-21 – 2019-06-22 (×2): via INTRAVENOUS
  Filled 2019-06-21 (×4): qty 1000

## 2019-06-21 MED ORDER — AMIODARONE HCL 200 MG PO TABS
200.0000 mg | ORAL_TABLET | Freq: Two times a day (BID) | ORAL | Status: DC
  Administered 2019-06-21 – 2019-06-22 (×3): 200 mg via ORAL
  Filled 2019-06-21 (×3): qty 1

## 2019-06-21 MED ORDER — SODIUM CHLORIDE 0.9 % IV SOLN: 500.0000 mg | INTRAVENOUS | Status: DC

## 2019-06-21 MED ORDER — SODIUM CHLORIDE 0.9 % IV SOLN: 2.0000 g | INTRAVENOUS | Status: DC

## 2019-06-21 MED ORDER — TORSEMIDE 20 MG PO TABS
20.0000 mg | ORAL_TABLET | ORAL | Status: DC
  Administered 2019-06-21: 20 mg via ORAL
  Filled 2019-06-21: qty 1

## 2019-06-21 MED ORDER — ENSURE ENLIVE PO LIQD
237.0000 mL | Freq: Two times a day (BID) | ORAL | Status: DC
  Administered 2019-06-21 – 2019-06-22 (×2): 237 mL via ORAL

## 2019-06-21 MED ORDER — ONDANSETRON HCL 4 MG PO TABS: 4.0000 mg | ORAL_TABLET | Freq: Four times a day (QID) | ORAL | Status: DC | PRN

## 2019-06-21 MED ORDER — SODIUM CHLORIDE 0.9 % IV SOLN
500.0000 mg | INTRAVENOUS | Status: DC
  Administered 2019-06-21 – 2019-06-22 (×2): 500 mg via INTRAVENOUS
  Filled 2019-06-21 (×3): qty 500

## 2019-06-21 MED ORDER — POTASSIUM CHLORIDE CRYS ER 20 MEQ PO TBCR
40.0000 meq | EXTENDED_RELEASE_TABLET | Freq: Once | ORAL | Status: AC
  Administered 2019-06-21: 02:00:00 40 meq via ORAL
  Filled 2019-06-21: qty 2

## 2019-06-21 MED ORDER — POTASSIUM CHLORIDE CRYS ER 20 MEQ PO TBCR
40.0000 meq | EXTENDED_RELEASE_TABLET | ORAL | Status: AC
  Administered 2019-06-21: 40 meq via ORAL
  Filled 2019-06-21: qty 2

## 2019-06-21 MED ORDER — METOPROLOL SUCCINATE ER 50 MG PO TB24
50.0000 mg | ORAL_TABLET | Freq: Every day | ORAL | Status: DC
  Administered 2019-06-21 – 2019-06-22 (×2): 50 mg via ORAL
  Filled 2019-06-21 (×2): qty 1

## 2019-06-21 MED ORDER — APIXABAN 5 MG PO TABS
5.0000 mg | ORAL_TABLET | Freq: Two times a day (BID) | ORAL | Status: DC
  Administered 2019-06-21 – 2019-06-22 (×3): 5 mg via ORAL
  Filled 2019-06-21 (×3): qty 1

## 2019-06-21 NOTE — ED Notes (Signed)
ED TO INPATIENT HANDOFF REPORT  ED Nurse Name and Phone #: Wells Guiles 35  S Name/Age/Gender Scott Gross 85 y.o. male Room/Bed: ED04A/ED04A  Code Status   Code Status: Prior  Home/SNF/Other Home Patient oriented to: self, place, time and situation Is this baseline? Yes   Triage Complete: Triage complete  Chief Complaint Weakness  Triage Note Pt to ED via EMS from home. Per ems pt had witnessed near syncopal episode while standing, pt states he never had LOC. Pt had confusion and loss of bladder function durign dizzy spell. Pt states this has happened in the past, dizziness lasted for few minutes. Pt denies pain or dizziness now. VSS   Allergies Allergies  Allergen Reactions  . Statins Other (See Comments)    Arthralagia    Level of Care/Admitting Diagnosis ED Disposition    ED Disposition Condition [facility] Area: [place] [[id number]]  Level of Care: Med-Surg [16]  Covid Evaluation: Confirmed COVID Negative  Date Laboratory Confirmed COVID Negative: 06/23/2019  Diagnosis: CAP (community acquired pneumonia) [[id number]]  Admitting Physician: Harrie Foreman [[id number]]  Attending Physician: Harrie Foreman [[id number]]  Estimated length of stay: past midnight tomorrow  Certification:: I certify this patient will need inpatient services for at least 2 midnights  PT Class (Do Not Modify): Inpatient [101]  PT Acc Code (Do Not Modify): Private [1]       B Medical/Surgery History Past Medical History:  Diagnosis Date  . Arthritis   . CAD (coronary artery disease) Nov. 12, 2012   Inferior ST elevation MI in November of 2012 with ventricular fibrillation arrest. Status post drug-eluting stent placement to the mid LAD. Most recent cardiac catheterization in June of this year showed patent stent with minimal restenosis, 75% proximal LAD and 80% distal LAD which were unchanged from before. Mild disease in the left circumflex and moderate  RCA disease. Ejection fraction was 55%.  . Cancer of upper lobe of left lung ([place]) 11/2016   Rad tx's.  . Cardiac arrest [facility]) Nov. 2012   x 2   . Cardiac arrest - ventricular fibrillation   . HOH (hard of hearing)    Left Hearing Aid  . Hyperlipidemia   . Hypertension   . Post PTCA 2013   x2  . ST elevation MI (STEMI) ([place]) 08/26/2011   Past Surgical History:  Procedure Laterality Date  . CARDIAC CATHETERIZATION  2013   @ [place]; [facility] s/p stent   . CORONARY ANGIOPLASTY    . ELECTROMAGNETIC NAVIGATION BROCHOSCOPY N/A 11/27/2016   Procedure: ELECTROMAGNETIC NAVIGATION BRONCHOSCOPY;  Surgeon: [name], MD;  Location: [facility] ORS;  Service: Cardiopulmonary;  Laterality: N/A;     A IV Location/Drains/Wounds Patient Lines/Drains/Airways Status   Active Line/Drains/Airways    Name:   Placement date:   Placement time:   Site:   Days:   Peripheral IV 06/20/19 Left Forearm   06/20/19    2353    Forearm   1          Intake/Output Last 24 hours  Intake/Output Summary (Last 24 hours) at 06/21/2019 0429 Last data filed at 06/21/2019 0242 Gross per 24 hour  Intake 346.34 ml  Output -  Net 346.34 ml    Labs/Imaging Results for orders placed or performed during the hospital encounter of 06/20/19 (from the past 48 hour(s))  Basic metabolic panel     Status: Abnormal   Collection Time: 06/20/19 11:46 PM  Result Value Ref Range  Sodium 134 (L) 135 - 145 mmol/L   Potassium 2.8 (L) 3.5 - 5.1 mmol/L   Chloride 95 (L) 98 - 111 mmol/L   CO2 26 22 - 32 mmol/L   Glucose, Bld 160 (H) 70 - 99 mg/dL   BUN 14 8 - 23 mg/dL   Creatinine, Ser 0.99 0.61 - 1.24 mg/dL   Calcium 8.3 (L) 8.9 - 10.3 mg/dL   GFR calc non Af Amer >60 >60 mL/min   GFR calc Af Amer >60 >60 mL/min   Anion gap 13 5 - 15    Comment: Performed at [facility], [address] [place], [place] [address]  CBC     Status: Abnormal   Collection Time: 06/20/19 11:46 PM  Result Value Ref Range   WBC 13.2 (H) 4.0 -  10.5 K/uL   RBC 4.29 4.22 - 5.81 MIL/uL   Hemoglobin 12.3 (L) 13.0 - 17.0 g/dL   HCT 36.4 (L) 39.0 - 52.0 %   MCV 84.8 80.0 - 100.0 fL   MCH 28.7 26.0 - 34.0 pg   MCHC 33.8 30.0 - 36.0 g/dL   RDW 13.9 11.5 - 15.5 %   Platelets 294 150 - 400 K/uL   nRBC 0.0 0.0 - 0.2 %    Comment: Performed at [facility], [place], [place] [address]  Troponin I (High Sensitivity)     Status: None   Collection Time: 06/20/19 11:46 PM  Result Value Ref Range   Troponin I (High Sensitivity) 8 <18 ng/L    Comment: (NOTE) Elevated high sensitivity troponin I (hsTnI) values and significant  changes across serial measurements may suggest ACS but many other  chronic and acute conditions are known to elevate hsTnI results.  Refer to the "Links" section for chest pain algorithms and additional  guidance. Performed at [facility], [address] [place], [place] [address]   Urinalysis, Complete w Microscopic     Status: Abnormal   Collection Time: 06/21/19 12:45 AM  Result Value Ref Range   Color, Urine STRAW (A) YELLOW   APPearance CLEAR (A) CLEAR   Specific Gravity, Urine 1.008 1.005 - 1.030   pH 7.0 5.0 - 8.0   Glucose, UA NEGATIVE NEGATIVE mg/dL   Hgb urine dipstick NEGATIVE NEGATIVE   Bilirubin Urine NEGATIVE NEGATIVE   Ketones, ur NEGATIVE NEGATIVE mg/dL   Protein, ur NEGATIVE NEGATIVE mg/dL   Nitrite NEGATIVE NEGATIVE   Leukocytes,Ua NEGATIVE NEGATIVE   RBC / HPF 0-5 0 - 5 RBC/hpf   WBC, UA 0-5 0 - 5 WBC/hpf   Bacteria, UA NONE SEEN NONE SEEN   Squamous Epithelial / LPF NONE SEEN 0 - 5    Comment: Performed at [facility], [address] [place], [place] [address]  Lactic acid, plasma     Status: None   Collection Time: 06/21/19 12:46 AM  Result Value Ref Range   Lactic Acid, Venous 1.4 0.5 - 1.9 mmol/L    Comment: Performed at [facility], [address] [place],  [address]  SARS Coronavirus 2 [facility] order, Performed  in [facility] hospital lab) Nasopharyngeal Nasopharyngeal Swab     Status: None   Collection Time: 06/21/19 12:46 AM   Specimen: Nasopharyngeal Swab  Result Value Ref Range   SARS Coronavirus 2 NEGATIVE NEGATIVE    Comment: (NOTE) If result is NEGATIVE SARS-CoV-2 target nucleic acids are NOT DETECTED. The SARS-CoV-2 RNA is generally detectable in upper and lower  respiratory specimens  during the acute phase of infection. The lowest  concentration of SARS-CoV-2 viral copies this assay can detect is 250  copies / mL. A negative result does not preclude SARS-CoV-2 infection  and should not be used as the sole basis for treatment or other  patient management decisions.  A negative result may occur with  improper specimen collection / handling, submission of specimen other  than nasopharyngeal swab, presence of viral mutation(s) within the  areas targeted by this assay, and inadequate number of viral copies  (<250 copies / mL). A negative result must be combined with clinical  observations, patient history, and epidemiological information. If result is POSITIVE SARS-CoV-2 target nucleic acids are DETECTED. The SARS-CoV-2 RNA is generally detectable in upper and lower  respiratory specimens dur ing the acute phase of infection.  Positive  results are indicative of active infection with SARS-CoV-2.  Clinical  correlation with patient history and other diagnostic information is  necessary to determine patient infection status.  Positive results do  not rule out bacterial infection or co-infection with other viruses. If result is PRESUMPTIVE POSTIVE SARS-CoV-2 nucleic acids MAY BE PRESENT.   A presumptive positive result was obtained on the submitted specimen  and confirmed on repeat testing.  While 2019 novel coronavirus  (SARS-CoV-2) nucleic acids may be present in the submitted sample  additional confirmatory testing may be necessary for epidemiological  and / or clinical management  purposes  to differentiate between  SARS-CoV-2 and other Sarbecovirus currently known to infect humans.  If clinically indicated additional testing with an alternate test  methodology [phone]) is advised. The SARS-CoV-2 RNA is generally  detectable in upper and lower respiratory sp ecimens during the acute  phase of infection. The expected result is Negative. Fact Sheet for Patients:  StrictlyIdeas.no Fact Sheet for Healthcare Providers: BankingDealers.co.za This test is not yet approved or cleared by the [place] FDA and has been authorized for detection and/or diagnosis of SARS-CoV-2 by FDA under an Emergency Use Authorization (EUA).  This EUA will remain in effect (meaning this test can be used) for the duration of the COVID-19 declaration under Section 564(b)(1) of the Act, 21 U.S.C. section 360bbb-3(b)(1), unless the authorization is terminated or revoked sooner. Performed at [facility], [address] [place],  [address]    Dg Chest Port 1 View  Result Date: 06/21/2019 CLINICAL DATA:  Dizziness EXAM: PORTABLE CHEST 1 VIEW COMPARISON:  May 23, 2019 FINDINGS: There is a left upper pulmonary mass as on prior exams. There appears to be subtly increased hazy airspace opacity adjacent to the mass. The right lung remains clear. There is hyperinflation of the upper lung zones. The cardiomediastinal silhouette is unchanged. Surgical clips seen within the left upper hemithorax. IMPRESSION: Left upper lung mass with question of mildly increased surrounding hazy airspace opacity. This could be due to adjacent atelectasis or early infectious etiology. Electronically Signed   By: Prudencio Pair M.D.   On: 06/21/2019 00:19    Pending Labs Unresulted Labs (From admission, onward)    Start     Ordered   06/21/19 0053  Blood Culture (routine x 2)  BLOOD CULTURE X 2,   STAT     06/21/19 0053   06/21/19 0053  Urine culture  ONCE  - STAT,   STAT     06/21/19 0053   Signed and Held  TSH  Add-on,   R     Signed and Held   Signed and Held  MRSA  PCR Screening  (Non-severe pneumonia (non-ICU care) in adult without resistant organism risk factors.)  Once,   R     Signed and Held   Signed and Held  Sputum Culture  (Non-severe pneumonia (non-ICU care) in adult without resistant organism risk factors.)  Once,   R     Signed and Held          Vitals/Pain Today's Vitals   06/20/19 2340 06/20/19 2342 06/21/19 0302 06/21/19 0400  BP:   116/79 125/76  Pulse:   75 72  Resp:   (!) 23 (!) 22  Temp:      TempSrc:      SpO2:   96% 95%  Weight:  62.6 kg    Height:  5\' 11"  (1.803 m)    PainSc: 0-No pain       Isolation Precautions No active isolations  Medications Medications  cefTRIAXone (ROCEPHIN) 2 g in sodium chloride 0.9 % 100 mL IVPB (0 g Intravenous Stopped 06/21/19 0134)  azithromycin (ZITHROMAX) 500 mg in sodium chloride 0.9 % 250 mL IVPB (0 mg Intravenous Stopped 06/21/19 0242)  potassium chloride SA (K-DUR) CR tablet 40 mEq (40 mEq Oral Given 06/21/19 0152)  potassium chloride SA (K-DUR) CR tablet 40 mEq (40 mEq Oral Given 06/21/19 0332)    Mobility walks Low fall risk   Focused Assessments Pulmonary Assessment Handoff:  Lung sounds:   O2 Device: Room Air        R Recommendations: See Admitting Provider Note  Report given to:   Additional Notes:

## 2019-06-21 NOTE — Progress Notes (Signed)
Patient admitted this am with CAP  I placed consult for pharamcy due to hypokalemia Continue management per Dr Marcille Blanco

## 2019-06-21 NOTE — Progress Notes (Signed)
CODE SEPSIS - PHARMACY COMMUNICATION  **Broad Spectrum Antibiotics should be administered within 1 hour of Sepsis diagnosis**  Time Code Sepsis Called/Page Received: 0055  Antibiotics Ordered: Rocephin and Zithromax  Time of 1st antibiotic administration: 0101, Rocephin  Additional action taken by pharmacy: n/a  If necessary, Name of Provider/Nurse Contacted: n/a   Ena Dawley ,PharmD Clinical Pharmacist  06/21/2019  12:56 AM

## 2019-06-21 NOTE — Progress Notes (Signed)
Initial Nutrition Assessment  DOCUMENTATION CODES:   Non-severe (moderate) malnutrition in context of chronic illness  INTERVENTION:  Provide Ensure Enlive po BID, each supplement provides 350 kcal and 20 grams of protein.  Encouraged adequate intake of calories and protein at meals. Provided patient and wife with "High-Calorie, High-Protein Nutrition Therapy" and "High-Calorie, High-Protein Recipes" handouts from the Academy of Nutrition and Dietetics.  NUTRITION DIAGNOSIS:   Moderate Malnutrition related to chronic illness(lung cancer) as evidenced by moderate fat depletion, moderate muscle depletion.  GOAL:   Patient will meet greater than or equal to 90% of their needs  MONITOR:   PO intake, Supplement acceptance, Labs, Weight trends, I & O's  REASON FOR ASSESSMENT:   Malnutrition Screening Tool    ASSESSMENT:   85 year old male with PMHx of CAD, STEMI, HLD, HTN, arthritis, lung cancer s/p XRT admitted with PNA, A-fib.   Met with patient and his wife at bedside. He reports he has had a decreased appetite for a while now. He has lung cancer and underwent 10 radiation treatments (appears that this was in 2018) and afterwards has had a decreased appetite and intake. He attempts to eat 2-3 meals per day but reports he does not eat much at his meals. He may have some salad or other vegetables as he does not like meat. He is amenable to drinking Ensure to help meet calorie/protein needs.  Patient reports that he has always had a small frame and that his UBW was around 149 lbs at most. He reports he has lost 13 lbs over the past few years. Patient is currently 58 kg (127.9 lbs) if current weight is accurate.  Medications reviewed and include: amiodarone, Eliquis, Colace 100 mg BID, torsemide 20 mg every other day, NS with KCl 20 mEq/L at 50 mL/hr, azithromycin, ceftriaxone.  Labs reviewed: Sodium 134, Potassium 2.8, Chloride 95.  NUTRITION - FOCUSED PHYSICAL EXAM:    Most  Recent Value  Orbital Region  Moderate depletion  Upper Arm Region  Moderate depletion  Thoracic and Lumbar Region  Moderate depletion  Buccal Region  Moderate depletion  Temple Region  Moderate depletion  Clavicle Bone Region  Moderate depletion  Clavicle and Acromion Bone Region  Moderate depletion  Scapular Bone Region  Moderate depletion  Dorsal Hand  Moderate depletion  Patellar Region  Moderate depletion  Anterior Thigh Region  Moderate depletion  Posterior Calf Region  Moderate depletion  Edema (RD Assessment)  None  Hair  Reviewed  Eyes  Reviewed  Mouth  Reviewed  Skin  Reviewed  Nails  Reviewed     Diet Order:   Diet Order            Diet Heart Room service appropriate? Yes; Fluid consistency: Thin  Diet effective now             EDUCATION NEEDS:   Education needs have been addressed  Skin:  Skin Assessment: Reviewed RN Assessment  Last BM:  06/21/2019 - medium type 3  Height:   Ht Readings from Last 1 Encounters:  06/21/19 _0  (1.803 m)   Weight:   Wt Readings from Last 1 Encounters:  06/21/19 58 kg   Ideal Body Weight:  78.2 kg  BMI:  Body mass index is 17.84 kg/m.  Estimated Nutritional Needs:   Kcal:  1700-1900  Protein:  85-95 grams  Fluid:  1.7-1.9 L/day  Scott Blade, MS, RD, LDN Office: [phone] Pager: [phone] After Hours/Weekend Pager: [phone]

## 2019-06-21 NOTE — H&P (Signed)
Scott Gross is an 85 y.o. male.   Chief Complaint: Syncope HPI: The patient with past medical history of CAD status post MI, hyperlipidemia, hypertension and active lung cancer presents to the emergency department due to an episode of dizziness and confusion.  Initially it was reported the patient had lost consciousness but he did not fall or lose consciousness.  He has had multiple episodes similar in character over the last 4 months.  He denies chest pain but admits to occasional cough and shortness of breath.  Chest x-ray was concerning for pneumonia versus atelectasis secondary to left upper lobe mass.  The patient was started on antibiotics prior to the emergency department staff called the hospitalist service for admission.  Past Medical History:  Diagnosis Date  . Arthritis   . CAD (coronary artery disease) Nov. 12, 2012   Inferior ST elevation MI in November of 2012 with ventricular fibrillation arrest. Status post drug-eluting stent placement to the mid LAD. Most recent cardiac catheterization in June of this year showed patent stent with minimal restenosis, 75% proximal LAD and 80% distal LAD which were unchanged from before. Mild disease in the left circumflex and moderate RCA disease. Ejection fraction was 55%.  . Cancer of upper lobe of left lung ([place]) 11/2016   Rad tx's.  . Cardiac arrest [facility]) Nov. 2012   x 2   . Cardiac arrest - ventricular fibrillation   . HOH (hard of hearing)    Left Hearing Aid  . Hyperlipidemia   . Hypertension   . Post PTCA 2013   x2  . ST elevation MI (STEMI) ([place]) 08/26/2011    Past Surgical History:  Procedure Laterality Date  . CARDIAC CATHETERIZATION  2013   @ [place]; [facility] s/p stent   . CORONARY ANGIOPLASTY    . ELECTROMAGNETIC NAVIGATION BROCHOSCOPY N/A 11/27/2016   Procedure: ELECTROMAGNETIC NAVIGATION BRONCHOSCOPY;  Surgeon: [name], MD;  Location: [facility] ORS;  Service: Cardiopulmonary;  Laterality: N/A;    Family History  Problem  Relation Age of Onset  . Heart attack Mother   . Heart attack Father    Social History:  reports that he quit smoking about 7 years ago. His smoking use included cigarettes. He smoked 1.00 pack per day. He has never used smokeless tobacco. He reports that he does not drink alcohol or use drugs.  Allergies:  Allergies  Allergen Reactions  . Statins Other (See Comments)    Arthralagia    Medications Prior to Admission  Medication Sig Dispense Refill  . amiodarone (PACERONE) 200 MG tablet Take 1 tablet (200 mg total) by mouth 2 (two) times daily. 60 tablet 0  . amoxicillin (AMOXIL) 500 MG capsule Take 500 mg by mouth 2 (two) times daily.    Marland Kitchen apixaban (ELIQUIS) 5 MG TABS tablet Take 1 tablet (5 mg total) by mouth 2 (two) times daily. 60 tablet 0  . metoprolol succinate (TOPROL-XL) 50 MG 24 hr tablet Take 50 mg by mouth daily.    Marland Kitchen torsemide (DEMADEX) 20 MG tablet Take 20 mg by mouth every other day.       Results for orders placed or performed during the hospital encounter of 06/20/19 (from the past 48 hour(s))  Basic metabolic panel     Status: Abnormal   Collection Time: 06/20/19 11:46 PM  Result Value Ref Range   Sodium 134 (L) 135 - 145 mmol/L   Potassium 2.8 (L) 3.5 - 5.1 mmol/L   Chloride 95 (L) 98 - 111 mmol/L  CO2 26 22 - 32 mmol/L   Glucose, Bld 160 (H) 70 - 99 mg/dL   BUN 14 8 - 23 mg/dL   Creatinine, Ser 0.99 0.61 - 1.24 mg/dL   Calcium 8.3 (L) 8.9 - 10.3 mg/dL   GFR calc non Af Amer >60 >60 mL/min   GFR calc Af Amer >60 >60 mL/min   Anion gap 13 5 - 15    Comment: Performed at [facility], [address] [place], [place] [address]  CBC     Status: Abnormal   Collection Time: 06/20/19 11:46 PM  Result Value Ref Range   WBC 13.2 (H) 4.0 - 10.5 K/uL   RBC 4.29 4.22 - 5.81 MIL/uL   Hemoglobin 12.3 (L) 13.0 - 17.0 g/dL   HCT 36.4 (L) 39.0 - 52.0 %   MCV 84.8 80.0 - 100.0 fL   MCH 28.7 26.0 - 34.0 pg   MCHC 33.8 30.0 - 36.0 g/dL   RDW 13.9 11.5 - 15.5 %    Platelets 294 150 - 400 K/uL   nRBC 0.0 0.0 - 0.2 %    Comment: Performed at [facility], [place], [place] [address]  Troponin I (High Sensitivity)     Status: None   Collection Time: 06/20/19 11:46 PM  Result Value Ref Range   Troponin I (High Sensitivity) 8 <18 ng/L    Comment: (NOTE) Elevated high sensitivity troponin I (hsTnI) values and significant  changes across serial measurements may suggest ACS but many other  chronic and acute conditions are known to elevate hsTnI results.  Refer to the "Links" section for chest pain algorithms and additional  guidance. Performed at [facility], [address] [place], [place] [address]   TSH     Status: None   Collection Time: 06/20/19 11:46 PM  Result Value Ref Range   TSH 2.971 0.350 - 4.500 uIU/mL    Comment: Performed by a 3rd Generation assay with a functional sensitivity of <=0.01 uIU/mL. Performed at [facility], [address] [place], [place] [address]   Urinalysis, Complete w Microscopic     Status: Abnormal   Collection Time: 06/21/19 12:45 AM  Result Value Ref Range   Color, Urine STRAW (A) YELLOW   APPearance CLEAR (A) CLEAR   Specific Gravity, Urine 1.008 1.005 - 1.030   pH 7.0 5.0 - 8.0   Glucose, UA NEGATIVE NEGATIVE mg/dL   Hgb urine dipstick NEGATIVE NEGATIVE   Bilirubin Urine NEGATIVE NEGATIVE   Ketones, ur NEGATIVE NEGATIVE mg/dL   Protein, ur NEGATIVE NEGATIVE mg/dL   Nitrite NEGATIVE NEGATIVE   Leukocytes,Ua NEGATIVE NEGATIVE   RBC / HPF 0-5 0 - 5 RBC/hpf   WBC, UA 0-5 0 - 5 WBC/hpf   Bacteria, UA NONE SEEN NONE SEEN   Squamous Epithelial / LPF NONE SEEN 0 - 5    Comment: Performed at [facility], [address] [place], [place] [address]  Lactic acid, plasma     Status: None   Collection Time: 06/21/19 12:46 AM  Result Value Ref Range   Lactic Acid, Venous 1.4 0.5 - 1.9 mmol/L    Comment: Performed at [facility], [address] [place], [place] [address]  SARS Coronavirus 2 [facility] order, Performed in [facility] hospital lab) Nasopharyngeal Nasopharyngeal Swab     Status: None   Collection Time: 06/21/19 12:46 AM   Specimen: Nasopharyngeal Swab  Result Value Ref Range   SARS Coronavirus 2  NEGATIVE NEGATIVE    Comment: (NOTE) If result is NEGATIVE SARS-CoV-2 target nucleic acids are NOT DETECTED. The SARS-CoV-2 RNA is generally detectable in upper and lower  respiratory specimens during the acute phase of infection. The lowest  concentration of SARS-CoV-2 viral copies this assay can detect is 250  copies / mL. A negative result does not preclude SARS-CoV-2 infection  and should not be used as the sole basis for treatment or other  patient management decisions.  A negative result may occur with  improper specimen collection / handling, submission of specimen other  than nasopharyngeal swab, presence of viral mutation(s) within the  areas targeted by this assay, and inadequate number of viral copies  (<250 copies / mL). A negative result must be combined with clinical  observations, patient history, and epidemiological information. If result is POSITIVE SARS-CoV-2 target nucleic acids are DETECTED. The SARS-CoV-2 RNA is generally detectable in upper and lower  respiratory specimens dur ing the acute phase of infection.  Positive  results are indicative of active infection with SARS-CoV-2.  Clinical  correlation with patient history and other diagnostic information is  necessary to determine patient infection status.  Positive results do  not rule out bacterial infection or co-infection with other viruses. If result is PRESUMPTIVE POSTIVE SARS-CoV-2 nucleic acids MAY BE PRESENT.   A presumptive positive result was obtained on the submitted specimen  and confirmed on repeat testing.  While 2019 novel coronavirus  (SARS-CoV-2) nucleic acids may be present in the submitted sample  additional confirmatory  testing may be necessary for epidemiological  and / or clinical management purposes  to differentiate between  SARS-CoV-2 and other Sarbecovirus currently known to infect humans.  If clinically indicated additional testing with an alternate test  methodology [phone]) is advised. The SARS-CoV-2 RNA is generally  detectable in upper and lower respiratory sp ecimens during the acute  phase of infection. The expected result is Negative. Fact Sheet for Patients:  StrictlyIdeas.no Fact Sheet for Healthcare Providers: BankingDealers.co.za This test is not yet approved or cleared by the [place] FDA and has been authorized for detection and/or diagnosis of SARS-CoV-2 by FDA under an Emergency Use Authorization (EUA).  This EUA will remain in effect (meaning this test can be used) for the duration of the COVID-19 declaration under Section 564(b)(1) of the Act, 21 U.S.C. section 360bbb-3(b)(1), unless the authorization is terminated or revoked sooner. Performed at [facility], [address] [place], [place] [address]   MRSA PCR Screening     Status: None   Collection Time: 06/21/19  5:56 AM   Specimen: Nasal Mucosa; Nasopharyngeal  Result Value Ref Range   MRSA by PCR NEGATIVE NEGATIVE    Comment:        The GeneXpert MRSA Assay (FDA approved for NASAL specimens only), is one component of a comprehensive MRSA colonization surveillance program. It is not intended to diagnose MRSA infection nor to guide or monitor treatment for MRSA infections. Performed at [facility], [address] [place], [place] [address]    Dg Chest Port 1 View  Result Date: 06/21/2019 CLINICAL DATA:  Dizziness EXAM: PORTABLE CHEST 1 VIEW COMPARISON:  May 23, 2019 FINDINGS: There is a left upper pulmonary mass as on prior exams. There appears to be subtly increased hazy airspace opacity adjacent to the mass. The right lung remains  clear. There is hyperinflation of the upper lung zones. The cardiomediastinal silhouette is unchanged. Surgical clips seen within the left upper hemithorax.  IMPRESSION: Left upper lung mass with question of mildly increased surrounding hazy airspace opacity. This could be due to adjacent atelectasis or early infectious etiology. Electronically Signed   By: Prudencio Pair M.D.   On: 06/21/2019 00:19    Review of Systems  Constitutional: Negative for chills and fever.  HENT: Negative for sore throat and tinnitus.   Eyes: Negative for blurred vision and redness.  Respiratory: Negative for cough and shortness of breath.   Cardiovascular: Negative for chest pain, palpitations, orthopnea and PND.  Gastrointestinal: Negative for abdominal pain, diarrhea, nausea and vomiting.  Genitourinary: Negative for dysuria, frequency and urgency.  Musculoskeletal: Negative for joint pain and myalgias.  Skin: Negative for rash.       No lesions  Neurological: Positive for dizziness. Negative for speech change, focal weakness and weakness.  Endo/Heme/Allergies: Does not bruise/bleed easily.       No temperature intolerance  Psychiatric/Behavioral: Negative for depression and suicidal ideas.    Blood pressure 121/79, pulse 68, temperature 98.2 F (36.8 C), temperature source Oral, resp. rate 20, height 5\' 11"  (1.803 m), weight 58 kg, SpO2 97 %. Physical Exam  Vitals reviewed. Constitutional: He is oriented to person, place, and time. He appears well-developed and well-nourished. No distress.  HENT:  Head: Normocephalic and atraumatic.  Mouth/Throat: Oropharynx is clear and moist.  Eyes: Pupils are equal, round, and reactive to light. Conjunctivae and EOM are normal. No scleral icterus.  Neck: Normal range of motion. Neck supple. No JVD present. No tracheal deviation present. No thyromegaly present.  Cardiovascular: Normal rate, regular rhythm and normal heart sounds. Exam reveals no gallop and no friction rub.   No murmur heard. Respiratory: Effort normal and breath sounds normal. No respiratory distress.  GI: Soft. Bowel sounds are normal. He exhibits no distension. There is no abdominal tenderness.  Genitourinary:    Genitourinary Comments: Deferred   Musculoskeletal: Normal range of motion.        General: No edema.  Lymphadenopathy:    He has no cervical adenopathy.  Neurological: He is alert and oriented to person, place, and time. No cranial nerve deficit.  Skin: Skin is warm and dry. No rash noted. No erythema.  Psychiatric: He has a normal mood and affect. His behavior is normal. Judgment and thought content normal.     Assessment/Plan This is an 85 year old male admitted for pneumonia. 1.  Pneumonia: Healthcare associated; continue ceftriaxone and azithromycin.  The patient has normal oxygen saturations on room air.   2.  Sepsis: The patient intermittently meets criteria via tachypnea and leukocytosis.  He is hemodynamically stable.  Follow blood cultures for growth and sensitivities.  Obtain sputum sample if possible. 3.  Lung cancer: Left upper lobe mass possibly causing obstructive pneumonia.  The patient underwent radiation therapy and April 2018 but has elected not to pursue further treatment. 4.  Hypokalemia: Replete potassium.  Continue to monitor telemetry. 5.  Atrial fibrillation: Rate controlled; continue metoprolol, amiodarone and Eliquis.  Episode of dizziness earlier may have been secondary to transient hypoxia due to paroxysms of cough or brief period of rapid ventricular rate. The patient takes torsemide every other day for intermittent swelling of his feet.  Cardiology consulted the discretion of primary team. 6.  DVT prophylaxis: Therapeutic anticoagulation 7.  GI prophylaxis: None The patient is a full code.  Time spent on admission orders and patient care approximately 45 minutes  Harrie Foreman, MD 06/21/2019, 7:51 AM

## 2019-06-21 NOTE — ED Notes (Signed)
Rebecca RN, aware of bed assigned 

## 2019-06-21 NOTE — Consult Note (Addendum)
PHARMACY CONSULT NOTE - FOLLOW UP  Pharmacy Consult for Electrolyte Monitoring and Replacement   Recent Labs: Potassium (mmol/L)  Date Value  06/21/2019 3.9  11/28/2013 3.6   Magnesium (mg/dL)  Date Value  06/21/2019 2.1  04/10/2012 1.9   Calcium (mg/dL)  Date Value  06/20/2019 8.3 (L)   Calcium, Total (mg/dL)  Date Value  11/28/2013 8.7   Albumin (g/dL)  Date Value  05/23/2019 3.0 (L)  11/28/2013 3.9   Sodium (mmol/L)  Date Value  06/20/2019 134 (L)  11/28/2013 137     Assessment: Pharmacy consulted for electrolyte monitoring and in 85 yo male admitted with Pneumonia and hypokalemia. K: 2.8 on admission.   Goal of Therapy:  Electrolytes WNL  Plan:  9/6 Patient received KCL 23mEq x 2 doses. Patient currently receiving NaCl w/KCL 11mEq @ 50 mL/hr. Most recent Mg and K WNL.   No additional replacement at this time.   Pharmacy will continue to monitor and replace electrolytes as needed.   F/U with AM labs   Pernell Dupre, PharmD, BCPS Clinical Pharmacist 06/21/2019 10:28 AM

## 2019-06-22 DIAGNOSIS — I252 Old myocardial infarction: Secondary | ICD-10-CM | POA: Diagnosis not present

## 2019-06-22 DIAGNOSIS — I482 Chronic atrial fibrillation, unspecified: Secondary | ICD-10-CM | POA: Diagnosis not present

## 2019-06-22 DIAGNOSIS — C3490 Malignant neoplasm of unspecified part of unspecified bronchus or lung: Secondary | ICD-10-CM | POA: Diagnosis not present

## 2019-06-22 DIAGNOSIS — C3412 Malignant neoplasm of upper lobe, left bronchus or lung: Secondary | ICD-10-CM | POA: Diagnosis not present

## 2019-06-22 DIAGNOSIS — J189 Pneumonia, unspecified organism: Secondary | ICD-10-CM | POA: Diagnosis not present

## 2019-06-22 DIAGNOSIS — E440 Moderate protein-calorie malnutrition: Secondary | ICD-10-CM | POA: Insufficient documentation

## 2019-06-22 DIAGNOSIS — A419 Sepsis, unspecified organism: Secondary | ICD-10-CM | POA: Diagnosis not present

## 2019-06-22 DIAGNOSIS — Z20828 Contact with and (suspected) exposure to other viral communicable diseases: Secondary | ICD-10-CM | POA: Diagnosis not present

## 2019-06-22 DIAGNOSIS — I2510 Atherosclerotic heart disease of native coronary artery without angina pectoris: Secondary | ICD-10-CM | POA: Diagnosis not present

## 2019-06-22 DIAGNOSIS — I10 Essential (primary) hypertension: Secondary | ICD-10-CM | POA: Diagnosis not present

## 2019-06-22 DIAGNOSIS — E876 Hypokalemia: Secondary | ICD-10-CM | POA: Diagnosis not present

## 2019-06-22 LAB — BASIC METABOLIC PANEL WITH GFR
Anion gap: 9 (ref 5–15)
BUN: 8 mg/dL (ref 8–23)
CO2: 27 mmol/L (ref 22–32)
Calcium: 8.1 mg/dL — ABNORMAL LOW (ref 8.9–10.3)
Chloride: 100 mmol/L (ref 98–111)
Creatinine, Ser: 0.91 mg/dL (ref 0.61–1.24)
GFR calc Af Amer: >60 mL/min
GFR calc non Af Amer: >60 mL/min
Glucose, Bld: 158 mg/dL — ABNORMAL HIGH (ref 70–99)
Potassium: 3.8 mmol/L (ref 3.5–5.1)
Sodium: 136 mmol/L (ref 135–145)

## 2019-06-22 LAB — URINE CULTURE: Culture: NO GROWTH

## 2019-06-22 LAB — MAGNESIUM: Magnesium: 1.9 mg/dL (ref 1.7–2.4)

## 2019-06-22 MED ORDER — CEFDINIR 300 MG PO CAPS: 300.0000 mg | ORAL_CAPSULE | Freq: Two times a day (BID) | ORAL | 0 refills | Status: AC

## 2019-06-22 MED ORDER — AZITHROMYCIN 500 MG PO TABS: 500.0000 mg | ORAL_TABLET | Freq: Every day | ORAL | 0 refills | Status: AC

## 2019-06-22 NOTE — Discharge Summary (Signed)
[place] [facility] [place] NAME: Dravyn Severs    MR#:  [id number]  DATE OF BIRTH:  Dec 17, 1933  DATE OF ADMISSION:  06/20/2019 ADMITTING PHYSICIAN: Harrie Foreman, MD  DATE OF DISCHARGE: 06/22/2019  PRIMARY CARE PHYSICIAN: [name], MD    ADMISSION DIAGNOSIS:  Community acquired pneumonia, unspecified laterality [J18.9]  DISCHARGE DIAGNOSIS:  Active Problems:   CAP (community acquired pneumonia)   SECONDARY DIAGNOSIS:   Past Medical History:  Diagnosis Date  . Arthritis   . CAD (coronary artery disease) Nov. 12, 2012   Inferior ST elevation MI in November of 2012 with ventricular fibrillation arrest. Status post drug-eluting stent placement to the mid LAD. Most recent cardiac catheterization in June of this year showed patent stent with minimal restenosis, 75% proximal LAD and 80% distal LAD which were unchanged from before. Mild disease in the left circumflex and moderate RCA disease. Ejection fraction was 55%.  . Cancer of upper lobe of left lung ([place]) 11/2016   Rad tx's.  . Cardiac arrest [facility]) Nov. 2012   x 2   . Cardiac arrest - ventricular fibrillation   . HOH (hard of hearing)    Left Hearing Aid  . Hyperlipidemia   . Hypertension   . Post PTCA 2013   x2  . ST elevation MI (STEMI) ([place]) 08/26/2011    HOSPITAL COURSE:  85 year old male with history of lung cancer receiving radiation and CAD who presented to the emergency room due to dizziness and confusion.  1.  Sepsis due to community-acquired pneumonia: Patient presented with tachypnea and leukocytosis.  Patient was on Rocephin and azithromycin.  Patient has tolerated these antibiotics well.  Patient will continue on cefdinir and azithromycin as an outpatient to complete course of therapy.  I am recommending that patient have repeat CT scan in 2 to 3 weeks given history of active lung cancer.   2.  Lung cancer: Patient is receiving radiation at this time.  He will  follow-up with Dr. Grayland Ormond this week.  3.  Hypokalemia: This was repleted  4.  Chronic atrial fibrillation: Heart rate has been controlled.  Patient will continue metoprolol, amiodarone and Eliquis for stroke prevention.  DISCHARGE CONDITIONS AND DIET:   Stable Regular diet  CONSULTS OBTAINED:    DRUG ALLERGIES:   Allergies  Allergen Reactions  . Statins Other (See Comments)    Arthralagia    DISCHARGE MEDICATIONS:   Allergies as of 06/22/2019      Reactions   Statins Other (See Comments)   Arthralagia      Medication List    STOP taking these medications   amoxicillin 500 MG capsule Commonly known as: AMOXIL     TAKE these medications   amiodarone 200 MG tablet Commonly known as: PACERONE Take 1 tablet (200 mg total) by mouth 2 (two) times daily.   apixaban 5 MG Tabs tablet Commonly known as: ELIQUIS Take 1 tablet (5 mg total) by mouth 2 (two) times daily.   azithromycin 500 MG tablet Commonly known as: Zithromax Take 1 tablet (500 mg total) by mouth daily for 3 days. Take 1 tablet daily for 3 days.   cefdinir 300 MG capsule Commonly known as: OMNICEF Take 1 capsule (300 mg total) by mouth 2 (two) times daily for 4 days.   metoprolol succinate 50 MG 24 hr tablet Commonly known as: TOPROL-XL Take 50 mg by mouth daily.   torsemide 20 MG tablet Commonly known as: DEMADEX Take 20  mg by mouth every other day.         Today   CHIEF COMPLAINT:  Doing well hard of hearing no cough or fevers   VITAL SIGNS:  Blood pressure 115/76, pulse 72, temperature 98.5 F (36.9 C), temperature source Oral, resp. rate 17, height 5\' 11"  (1.803 m), weight 58 kg, SpO2 98 %.   REVIEW OF SYSTEMS:  Review of Systems  Constitutional: Negative.  Negative for chills, fever and malaise/fatigue.  HENT: Positive for hearing loss. Negative for ear discharge, ear pain, nosebleeds and sore throat.   Eyes: Negative.  Negative for blurred vision and pain.  Respiratory:  Negative.  Negative for cough, hemoptysis, shortness of breath and wheezing.   Cardiovascular: Negative.  Negative for chest pain, palpitations and leg swelling.  Gastrointestinal: Negative.  Negative for abdominal pain, blood in stool, diarrhea, nausea and vomiting.  Genitourinary: Negative.  Negative for dysuria.  Musculoskeletal: Negative.  Negative for back pain.  Skin: Negative.   Neurological: Negative for dizziness, tremors, speech change, focal weakness, seizures and headaches.  Endo/Heme/Allergies: Negative.  Does not bruise/bleed easily.  Psychiatric/Behavioral: Negative.  Negative for depression, hallucinations and suicidal ideas.     PHYSICAL EXAMINATION:  GENERAL:  85 y.o.-year-old patient lying in the bed with no acute distress.  NECK:  Supple, no jugular venous distention. No thyroid enlargement, no tenderness.  LUNGS: Normal breath sounds bilaterally, no wheezing, rales,rhonchi  No use of accessory muscles of respiration.  CARDIOVASCULAR: IRR< IRR 2/6 SEM  No murmurs, rubs, or gallops.  ABDOMEN: Soft, non-tender, non-distended. Bowel sounds present. No organomegaly or mass.  EXTREMITIES: No pedal edema, cyanosis, or clubbing.  PSYCHIATRIC: The patient is alert and oriented x 3.  SKIN: No obvious rash, lesion, or ulcer.   DATA REVIEW:   CBC Recent Labs  Lab 06/20/19 2346  WBC 13.2*  HGB 12.3*  HCT 36.4*  PLT 294    Chemistries  Recent Labs  Lab 06/22/19 0424  NA 136  K 3.8  CL 100  CO2 27  GLUCOSE 158*  BUN 8  CREATININE 0.91  CALCIUM 8.1*  MG 1.9    Cardiac Enzymes No results for input(s): TROPONINI in the last 168 hours.  Microbiology Results  @[email or web address] @  RADIOLOGY:  Dg Chest Port 1 View  Result Date: 06/21/2019 CLINICAL DATA:  Dizziness EXAM: PORTABLE CHEST 1 VIEW COMPARISON:  May 23, 2019 FINDINGS: There is a left upper pulmonary mass as on prior exams. There appears to be subtly increased hazy airspace opacity adjacent to the mass. The  right lung remains clear. There is hyperinflation of the upper lung zones. The cardiomediastinal silhouette is unchanged. Surgical clips seen within the left upper hemithorax. IMPRESSION: Left upper lung mass with question of mildly increased surrounding hazy airspace opacity. This could be due to adjacent atelectasis or early infectious etiology. Electronically Signed   By: Prudencio Pair M.D.   On: 06/21/2019 00:19      Allergies as of 06/22/2019      Reactions   Statins Other (See Comments)   Arthralagia      Medication List    STOP taking these medications   amoxicillin 500 MG capsule Commonly known as: AMOXIL     TAKE these medications   amiodarone 200 MG tablet Commonly known as: PACERONE Take 1 tablet (200 mg total) by mouth 2 (two) times daily.   apixaban 5 MG Tabs tablet Commonly known as: ELIQUIS Take 1 tablet (5 mg total) by mouth 2 (two) times daily.  azithromycin 500 MG tablet Commonly known as: Zithromax Take 1 tablet (500 mg total) by mouth daily for 3 days. Take 1 tablet daily for 3 days.   cefdinir 300 MG capsule Commonly known as: OMNICEF Take 1 capsule (300 mg total) by mouth 2 (two) times daily for 4 days.   metoprolol succinate 50 MG 24 hr tablet Commonly known as: TOPROL-XL Take 50 mg by mouth daily.   torsemide 20 MG tablet Commonly known as: DEMADEX Take 20 mg by mouth every other day.         Management plans discussed with the patient and he is in agreement. Stable for discharge home  Patient should follow up with oncology  CODE STATUS:     Code Status Orders  (From admission, onward)         Start     Ordered   06/21/19 0541  Full code  Continuous     06/21/19 0540        Code Status History    Date Active Date Inactive Code Status Order ID Comments User Context   05/23/2019 2340 05/25/2019 1431 Full Code [id number]  Lance Coon, MD Inpatient   05/20/2019 1449 05/21/2019 1727 Full Code [id number]  Saundra Shelling, MD ED   Advance  Care Planning Activity      TOTAL TIME TAKING CARE OF THIS PATIENT: 38 minutes.    Note: This dictation was prepared with Dragon dictation along with smaller phrase technology. Any transcriptional errors that result from this process are unintentional.  Bettey Costa M.D on 06/22/2019 at 9:58 AM  Between 7am to 6pm - Pager - [phone] After 6pm go to [email or web address] - password [email or web address]  Office  [phone]  CC: Primary care physician; [name], MD

## 2019-06-22 NOTE — Progress Notes (Signed)
MD notified: The patient's daughter wants you to please call her at [phone]. The patient told her she was going to be discharged and she had questions regarding oncology following up with the patient and repeating a possible CT of the chest.

## 2019-06-22 NOTE — Consult Note (Signed)
PHARMACY CONSULT NOTE - FOLLOW UP  Pharmacy Consult for Electrolyte Monitoring and Replacement   Recent Labs: Potassium (mmol/L)  Date Value  06/22/2019 3.8  11/28/2013 3.6   Magnesium (mg/dL)  Date Value  06/22/2019 1.9  04/10/2012 1.9   Calcium (mg/dL)  Date Value  06/22/2019 8.1 (L)   Calcium, Total (mg/dL)  Date Value  11/28/2013 8.7   Albumin (g/dL)  Date Value  05/23/2019 3.0 (L)  11/28/2013 3.9   Sodium (mmol/L)  Date Value  06/22/2019 136  11/28/2013 137     Assessment: Pharmacy consulted for electrolyte monitoring and in 85 yo male admitted with Pneumonia and hypokalemia. K: 2.8 on admission.   Goal of Therapy:  Electrolytes WNL  Plan:  9/6 Patient received KCL 75mEq x 2 doses.   Patient currently receiving NaCl w/KCL 26mEq @ 50 mL/hr.   Most recent Mg and K WNL.  No additional replacement at this time.   Pharmacy will continue to monitor and replace electrolytes as needed.   F/U with AM labs   Lu Duffel, PharmD, BCPS Clinical Pharmacist 06/22/2019 7:52 AM

## 2019-06-22 NOTE — Progress Notes (Signed)
Family Meeting Note  Advance Directive:yes  Today a meeting took place with the Patient.  The following clinical team members were present during this meeting:MD  The following were discussed:Patient's diagnosis: , Patient's progosis: > 12 months and Goals for treatment: Full Code  Additional follow-up to be provided: FUL CODE AD up to date And daughters POA I Confirmed with daughter this am.  Time spent during discussion:17 minutes  Bettey Costa, MD

## 2019-06-22 NOTE — Plan of Care (Signed)
  Problem: Education: Goal: Knowledge of General Education information will improve Description: Including pain rating scale, medication(s)/side effects and non-pharmacologic comfort measures Outcome: Adequate for Discharge   Problem: Health Behavior/Discharge Planning: Goal: Ability to manage health-related needs will improve Outcome: Adequate for Discharge   Problem: Clinical Measurements: Goal: Ability to maintain clinical measurements within normal limits will improve Outcome: Adequate for Discharge Goal: Will remain free from infection Outcome: Adequate for Discharge Goal: Diagnostic test results will improve Outcome: Adequate for Discharge Goal: Respiratory complications will improve Outcome: Adequate for Discharge Goal: Cardiovascular complication will be avoided Outcome: Adequate for Discharge   Problem: Activity: Goal: Risk for activity intolerance will decrease Outcome: Adequate for Discharge   Problem: Nutrition: Goal: Adequate nutrition will be maintained Outcome: Adequate for Discharge   Problem: Coping: Goal: Level of anxiety will decrease Outcome: Adequate for Discharge   Problem: Elimination: Goal: Will not experience complications related to bowel motility Outcome: Adequate for Discharge Goal: Will not experience complications related to urinary retention Outcome: Adequate for Discharge   Problem: Skin Integrity: Goal: Risk for impaired skin integrity will decrease Outcome: Adequate for Discharge   Problem: Safety: Goal: Ability to remain free from injury will improve Outcome: Adequate for Discharge   Problem: Pain Managment: Goal: General experience of comfort will improve Outcome: Adequate for Discharge   Problem: Malnutrition  (NI-5.2) Goal: Food and/or nutrient delivery Description: Individualized approach for food/nutrient provision. Outcome: Adequate for Discharge

## 2019-06-24 ENCOUNTER — Other Ambulatory Visit: Payer: Self-pay

## 2019-06-25 ENCOUNTER — Other Ambulatory Visit: Payer: Self-pay | Admitting: *Deleted

## 2019-06-25 ENCOUNTER — Other Ambulatory Visit: Payer: Self-pay

## 2019-06-25 ENCOUNTER — Encounter: Payer: Self-pay | Admitting: Radiation Oncology

## 2019-06-25 ENCOUNTER — Ambulatory Visit
Admission: RE | Admit: 2019-06-25 | Discharge: 2019-06-25 | Disposition: A | Discharge status: Home / Self Care | Payer: Medicare HMO | Source: Ambulatory Visit | Attending: Radiation Oncology | Admitting: Radiation Oncology

## 2019-06-25 ENCOUNTER — Inpatient Hospital Stay: Payer: Medicare HMO | Attending: Oncology | Admitting: Oncology

## 2019-06-25 ENCOUNTER — Encounter: Payer: Self-pay | Admitting: Oncology

## 2019-06-25 VITALS — BP 127/74 | HR 77 | Temp 97.4°F | Wt 132.0 lb

## 2019-06-25 VITALS — BP 127/74 | HR 77 | Temp 97.4°F | Resp 18 | Wt 132.1 lb

## 2019-06-25 DIAGNOSIS — I4891 Unspecified atrial fibrillation: Secondary | ICD-10-CM | POA: Diagnosis not present

## 2019-06-25 DIAGNOSIS — Z7901 Long term (current) use of anticoagulants: Secondary | ICD-10-CM | POA: Diagnosis not present

## 2019-06-25 DIAGNOSIS — C3412 Malignant neoplasm of upper lobe, left bronchus or lung: Secondary | ICD-10-CM | POA: Insufficient documentation

## 2019-06-25 DIAGNOSIS — R0602 Shortness of breath: Secondary | ICD-10-CM | POA: Diagnosis not present

## 2019-06-25 DIAGNOSIS — R5383 Other fatigue: Secondary | ICD-10-CM | POA: Insufficient documentation

## 2019-06-25 DIAGNOSIS — Z923 Personal history of irradiation: Secondary | ICD-10-CM | POA: Insufficient documentation

## 2019-06-25 NOTE — Progress Notes (Signed)
Radiation Oncology Follow up Note  Name: Scott Gross   Date:   06/25/2019 MRN:  [id number] DOB: [birth year]    This 85 y.o. male presents to the clinic today for 1 month follow-up status post SBRT to an area of the left lung previously treated 2 years prior with SBRT for stage I adenocarcinoma.  REFERRING PROVIDER: [name], MD  HPI: Patient is an 85 year old male now seen at 1 month having completed SBRT to an area of the left lung previously treated 2 years prior for stage I adenocarcinoma seen today in routine follow-up he is doing fairly well he states he is extremely tired and fatigued.  P.o. intake is good he is having no specific cough hemoptysis chest tightness.  Has been running some low-grade fevers according to his daughter and has had several ER visits.  Chest x-rays in the ER which I have reviewed shows treatment response with some haziness around his prior treated field.Marland Kitchen  His daughter is also states his oxygen saturations have been borderline.  COMPLICATIONS OF TREATMENT: none  FOLLOW UP COMPLIANCE: keeps appointments   PHYSICAL EXAM:  BP 127/74 (BP Location: Left Arm, Patient Position: Sitting)   Pulse 77   Temp (!) 97.4 F (36.3 C) (Tympanic)   Resp 18   Wt 132 lb 1.6 oz (59.9 kg)   BMI 18.42 kg/m  Well-developed well-nourished patient in NAD. HEENT reveals PERLA, EOMI, discs not visualized.  Oral cavity is clear. No oral mucosal lesions are identified. Neck is clear without evidence of cervical or supraclavicular adenopathy. Lungs are clear to A&P. Cardiac examination is essentially unremarkable with regular rate and rhythm without murmur rub or thrill. Abdomen is benign with no organomegaly or masses noted. Motor sensory and DTR levels are equal and symmetric in the upper and lower extremities. Cranial nerves II through XII are grossly intact. Proprioception is intact. No peripheral adenopathy or edema is identified. No motor or sensory levels are noted. Crude  visual fields are within normal range.  RADIOLOGY RESULTS: Chest x-rays are reviewed compatible with above-stated findings  PLAN: At this time I believe is too early to do a CT scan I like to wait another couple of months and ordered a CT scan of the chest with contrast.  I am otherwise also referring the patient to [facility] for pulmonology consultation.  Certainly with his borderline sats he may benefit from therapy.  I have asked to see him back in 3 months with a CT scan prior to that visit.  He is being followed up today by medical oncology.  Daughter and patient both know to call with any concerns.  I would like to take this opportunity to thank you for allowing me to participate in the care of your patient.Noreene Filbert, MD

## 2019-06-25 NOTE — Progress Notes (Signed)
Patient here today for follow up regarding lung cancer. Patient would like to know when he will have more energy.

## 2019-06-26 LAB — CULTURE, BLOOD (ROUTINE X 2)
Culture: NO GROWTH
Culture: NO GROWTH
Special Requests: ADEQUATE
Special Requests: ADEQUATE

## 2019-06-26 NOTE — Progress Notes (Signed)
[place]  Telephone:([phone] Fax:(336) [phone]  ID: Scott Gross OB: 1933-12-11  MR#: [id number]  URK#:[id number]  Patient Care Team: [name], MD as PCP - General (Family Medicine)   CHIEF COMPLAINT: Adenocarcinoma of upper lobe of left lung.  INTERVAL HISTORY: Patient returns to clinic today for further evaluation and hospital follow-up.  He is completed his XRT and is had several admissions to the hospital for atrial fibrillation and community-acquired pneumonia.  He has increased weakness and fatigue today, but otherwise feels well. He has no neurologic complaints. He denies any chest pain, cough, hemoptysis, or shortness of breath. He has a good appetite and denies weight loss. He has no nausea, vomiting, constipation, or diarrhea. He has no urinary complaints.  Patient offers no further specific complaints today.  REVIEW OF SYSTEMS:   Review of Systems  Constitutional: Positive for malaise/fatigue. Negative for fever and weight loss.  Respiratory: Positive for shortness of breath. Negative for cough and hemoptysis.   Cardiovascular: Negative.  Negative for chest pain and leg swelling.  Gastrointestinal: Negative.  Negative for abdominal pain.  Genitourinary: Negative.  Negative for dysuria.  Musculoskeletal: Negative.  Negative for joint pain.  Skin: Negative.  Negative for rash.  Neurological: Positive for weakness. Negative for sensory change, focal weakness and headaches.  Psychiatric/Behavioral: Negative.  The patient is not nervous/anxious.     As per HPI. Otherwise, a complete review of systems is negative.  PAST MEDICAL HISTORY: Past Medical History:  Diagnosis Date  . Arthritis   . CAD (coronary artery disease) Nov. 12, 2012   Inferior ST elevation MI in November of 2012 with ventricular fibrillation arrest. Status post drug-eluting stent placement to the mid LAD. Most recent cardiac catheterization in June of this year showed  patent stent with minimal restenosis, 75% proximal LAD and 80% distal LAD which were unchanged from before. Mild disease in the left circumflex and moderate RCA disease. Ejection fraction was 55%.  . Cancer of upper lobe of left lung ([place]) 11/2016   Rad tx's.  . Cardiac arrest [facility]) Nov. 2012   x 2   . Cardiac arrest - ventricular fibrillation   . HOH (hard of hearing)    Left Hearing Aid  . Hyperlipidemia   . Hypertension   . Post PTCA 2013   x2  . ST elevation MI (STEMI) ([place]) 08/26/2011    PAST SURGICAL HISTORY: Past Surgical History:  Procedure Laterality Date  . CARDIAC CATHETERIZATION  2013   @ [place]; [facility] s/p stent   . CORONARY ANGIOPLASTY    . ELECTROMAGNETIC NAVIGATION BROCHOSCOPY N/A 11/27/2016   Procedure: ELECTROMAGNETIC NAVIGATION BRONCHOSCOPY;  Surgeon: [name], MD;  Location: [facility] ORS;  Service: Cardiopulmonary;  Laterality: N/A;    FAMILY HISTORY: Family History  Problem Relation Age of Onset  . Heart attack Mother   . Heart attack Father     ADVANCED DIRECTIVES (Y/N):  N  HEALTH MAINTENANCE: Social History   Tobacco Use  . Smoking status: Former Smoker    Packs/day: 1.00    Types: Cigarettes    Quit date: 08/27/2011    Years since quitting: 7.8  . Smokeless tobacco: Never Used  Substance Use Topics  . Alcohol use: No  . Drug use: No     Colonoscopy:  PAP:  Bone density:  Lipid panel:  Allergies  Allergen Reactions  . Statins Other (See Comments)    Arthralagia    Current Outpatient Medications  Medication Sig Dispense Refill  .  amiodarone (PACERONE) 200 MG tablet Take 1 tablet (200 mg total) by mouth 2 (two) times daily. 60 tablet 0  . apixaban (ELIQUIS) 5 MG TABS tablet Take 1 tablet (5 mg total) by mouth 2 (two) times daily. 60 tablet 0  . cefdinir (OMNICEF) 300 MG capsule Take 1 capsule (300 mg total) by mouth 2 (two) times daily for 4 days. 8 capsule 0  . LABETALOL HCL PO Take by mouth as needed. For BP if not brought down by  lisinopril/hctz.    . lisinopril-hydrochlorothiazide (ZESTORETIC) 20-12.5 MG tablet     . metoprolol succinate (TOPROL-XL) 50 MG 24 hr tablet Take 50 mg by mouth daily.    . mirtazapine (REMERON) 15 MG tablet Take by mouth.    . torsemide (DEMADEX) 20 MG tablet Take 20 mg by mouth every other day.      No current facility-administered medications for this visit.     OBJECTIVE: Vitals:   06/25/19 1601  BP: 127/74  Pulse: 77  Temp: (!) 97.4 F (36.3 C)     Body mass index is 18.41 kg/m.    ECOG FS:0 - Asymptomatic  General: Thin, no acute distress. Eyes: Pink conjunctiva, anicteric sclera. HEENT: Normocephalic, moist mucous membranes. Lungs: Clear to auscultation bilaterally. Heart: Regular rate and rhythm. No rubs, murmurs, or gallops. Abdomen: Soft, nontender, nondistended. No organomegaly noted, normoactive bowel sounds. Musculoskeletal: No edema, cyanosis, or clubbing. Neuro: Alert, answering all questions appropriately. Cranial nerves grossly intact. Skin: No rashes or petechiae noted. Psych: Normal affect.  LAB RESULTS:  Lab Results  Component Value Date   NA 136 06/22/2019   K 3.8 06/22/2019   CL 100 06/22/2019   CO2 27 06/22/2019   GLUCOSE 158 (H) 06/22/2019   BUN 8 06/22/2019   CREATININE 0.91 06/22/2019   CALCIUM 8.1 (L) 06/22/2019   PROT 6.2 (L) 05/23/2019   ALBUMIN 3.0 (L) 05/23/2019   AST 17 05/23/2019   ALT 14 05/23/2019   ALKPHOS 78 05/23/2019   BILITOT 1.1 05/23/2019   GFRNONAA >60 06/22/2019   GFRAA >60 06/22/2019    Lab Results  Component Value Date   WBC 13.2 (H) 06/20/2019   NEUTROABS 13.7 (H) 05/23/2019   HGB 12.3 (L) 06/20/2019   HCT 36.4 (L) 06/20/2019   MCV 84.8 06/20/2019   PLT 294 06/20/2019     STUDIES: Dg Chest Port 1 View  Result Date: 06/21/2019 CLINICAL DATA:  Dizziness EXAM: PORTABLE CHEST 1 VIEW COMPARISON:  May 23, 2019 FINDINGS: There is a left upper pulmonary mass as on prior exams. There appears to be subtly  increased hazy airspace opacity adjacent to the mass. The right lung remains clear. There is hyperinflation of the upper lung zones. The cardiomediastinal silhouette is unchanged. Surgical clips seen within the left upper hemithorax. IMPRESSION: Left upper lung mass with question of mildly increased surrounding hazy airspace opacity. This could be due to adjacent atelectasis or early infectious etiology. Electronically Signed   By: Prudencio Pair M.D.   On: 06/21/2019 00:19    ASSESSMENT:  Adenocarcinoma of upper lobe of left lung   PLAN:   1.  Adenocarcinoma of upper lobe of left lung: Biopsy of lung nodule on November 27, 2016 revealed adenocarcinoma, but there was no invasive component on the sample. Biopsy of mediastinal lymph node had insufficient material. Patient ultimately declined any further biopsies or surgery and elected to proceed with XRT alone which he completed in April 2018.  PET scan on January 22, 2019 reviewed  independently with increased metabolic activity prior to previous with SUV increasing from 6.5 up to 8.8.  Patient declined intervention at that time and elected to proceed with simple observation.  Repeat CT scan on April 21, 2019 reviewed independently with progressive 6.4 cm mass in the left upper lobe.  Patient declined systemic treatment, but agreed to proceed with XRT which she completed in approximately August 2020.  No intervention is needed at this time.  Will repeat CT scan in 3 months to assess for interval change.  Follow-up 1 to 2 days after imaging.   2.  Shortness of breath: Patient was given a referral to [facility] pulmonology.  I spent a total of 30 minutes face-to-face with the patient of which greater than 50% of the visit was spent in counseling and coordination of care as detailed above.   Patient expressed understanding and was in agreement with this plan. He also understands that He can call clinic at any time with any questions, concerns, or complaints.   Cancer  Staging No matching staging information was found for the patient.  [name], MD   06/26/2019 6:24 AM

## 2019-06-29 DIAGNOSIS — R634 Abnormal weight loss: Secondary | ICD-10-CM | POA: Diagnosis not present

## 2019-06-29 DIAGNOSIS — Z09 Encounter for follow-up examination after completed treatment for conditions other than malignant neoplasm: Secondary | ICD-10-CM | POA: Diagnosis not present

## 2019-06-29 DIAGNOSIS — Z681 Body mass index (BMI) 19 or less, adult: Secondary | ICD-10-CM | POA: Diagnosis not present

## 2019-06-29 DIAGNOSIS — J159 Unspecified bacterial pneumonia: Secondary | ICD-10-CM | POA: Diagnosis not present

## 2019-06-30 DIAGNOSIS — I701 Atherosclerosis of renal artery: Secondary | ICD-10-CM | POA: Diagnosis not present

## 2019-06-30 DIAGNOSIS — I10 Essential (primary) hypertension: Secondary | ICD-10-CM | POA: Diagnosis not present

## 2019-06-30 DIAGNOSIS — I208 Other forms of angina pectoris: Secondary | ICD-10-CM | POA: Diagnosis not present

## 2019-06-30 DIAGNOSIS — I480 Paroxysmal atrial fibrillation: Secondary | ICD-10-CM | POA: Diagnosis not present

## 2019-06-30 DIAGNOSIS — C3412 Malignant neoplasm of upper lobe, left bronchus or lung: Secondary | ICD-10-CM | POA: Diagnosis not present

## 2019-06-30 DIAGNOSIS — E785 Hyperlipidemia, unspecified: Secondary | ICD-10-CM | POA: Diagnosis not present

## 2019-06-30 DIAGNOSIS — R55 Syncope and collapse: Secondary | ICD-10-CM | POA: Diagnosis not present

## 2019-06-30 DIAGNOSIS — E119 Type 2 diabetes mellitus without complications: Secondary | ICD-10-CM | POA: Diagnosis not present

## 2019-06-30 DIAGNOSIS — I739 Peripheral vascular disease, unspecified: Secondary | ICD-10-CM | POA: Diagnosis not present

## 2019-07-03 DIAGNOSIS — C3412 Malignant neoplasm of upper lobe, left bronchus or lung: Secondary | ICD-10-CM | POA: Diagnosis not present

## 2019-08-18 DIAGNOSIS — E785 Hyperlipidemia, unspecified: Secondary | ICD-10-CM | POA: Diagnosis not present

## 2019-08-18 DIAGNOSIS — I208 Other forms of angina pectoris: Secondary | ICD-10-CM | POA: Diagnosis not present

## 2019-08-18 DIAGNOSIS — I739 Peripheral vascular disease, unspecified: Secondary | ICD-10-CM | POA: Diagnosis not present

## 2019-08-18 DIAGNOSIS — R55 Syncope and collapse: Secondary | ICD-10-CM | POA: Diagnosis not present

## 2019-08-18 DIAGNOSIS — E119 Type 2 diabetes mellitus without complications: Secondary | ICD-10-CM | POA: Diagnosis not present

## 2019-08-18 DIAGNOSIS — I480 Paroxysmal atrial fibrillation: Secondary | ICD-10-CM | POA: Diagnosis not present

## 2019-08-18 DIAGNOSIS — I10 Essential (primary) hypertension: Secondary | ICD-10-CM | POA: Diagnosis not present

## 2019-08-18 DIAGNOSIS — I701 Atherosclerosis of renal artery: Secondary | ICD-10-CM | POA: Diagnosis not present

## 2019-08-18 DIAGNOSIS — C3412 Malignant neoplasm of upper lobe, left bronchus or lung: Secondary | ICD-10-CM | POA: Diagnosis not present

## 2019-09-17 ENCOUNTER — Other Ambulatory Visit: Payer: Self-pay

## 2019-09-17 DIAGNOSIS — Z20822 Contact with and (suspected) exposure to covid-19: Secondary | ICD-10-CM

## 2019-09-19 LAB — NOVEL CORONAVIRUS, NAA: SARS-CoV-2, NAA: NOT DETECTED

## 2019-09-20 NOTE — Progress Notes (Signed)
[place]  Telephone:(336) [phone] Fax:(336) [phone]  ID: [name] OB: [birth year]  MR#: [id number]  AST#:[id number]  Patient Care Team: [name], MD as PCP - General (Family Medicine)   CHIEF COMPLAINT: Adenocarcinoma of upper lobe of left lung.  INTERVAL HISTORY: Patient returns to clinic today for further evaluation and discussion of his imaging results.  He continues to have chronic weakness and fatigue.  He has a good appetite, but continues to have trouble gaining weight.  He otherwise feels well.  He has no neurologic complaints. He denies any chest pain, cough, hemoptysis, or shortness of breath.  He has no nausea, vomiting, constipation, or diarrhea. He has no urinary complaints.  Patient offers no further specific complaints today.  REVIEW OF SYSTEMS:   Review of Systems  Constitutional: Negative.  Negative for fever, malaise/fatigue and weight loss.  Respiratory: Positive for shortness of breath. Negative for cough and hemoptysis.   Cardiovascular: Negative.  Negative for chest pain and leg swelling.  Gastrointestinal: Negative.  Negative for abdominal pain.  Genitourinary: Negative.  Negative for dysuria.  Musculoskeletal: Negative.  Negative for joint pain.  Skin: Negative.  Negative for rash.  Neurological: Positive for weakness. Negative for sensory change, focal weakness and headaches.  Psychiatric/Behavioral: Negative.  The patient is not nervous/anxious.     As per HPI. Otherwise, a complete review of systems is negative.  PAST MEDICAL HISTORY: Past Medical History:  Diagnosis Date  . Arthritis   . CAD (coronary artery disease) Nov. 12, 2012   Inferior ST elevation MI in November of 2012 with ventricular fibrillation arrest. Status post drug-eluting stent placement to the mid LAD. Most recent cardiac catheterization in June of this year showed patent stent with minimal restenosis, 75% proximal LAD and 80% distal LAD which were  unchanged from before. Mild disease in the left circumflex and moderate RCA disease. Ejection fraction was 55%.  . Cancer of upper lobe of left lung ([place]) 11/2016   Rad tx's.  . Cardiac arrest [facility]) Nov. 2012   x 2   . Cardiac arrest - ventricular fibrillation   . HOH (hard of hearing)    Left Hearing Aid  . Hyperlipidemia   . Hypertension   . Post PTCA 2013   x2  . ST elevation MI (STEMI) ([place]) 08/26/2011    PAST SURGICAL HISTORY: Past Surgical History:  Procedure Laterality Date  . CARDIAC CATHETERIZATION  2013   @ [place]; [facility] s/p stent   . CORONARY ANGIOPLASTY    . ELECTROMAGNETIC NAVIGATION BROCHOSCOPY N/A 11/27/2016   Procedure: ELECTROMAGNETIC NAVIGATION BRONCHOSCOPY;  Surgeon: [name], MD;  Location: [facility] ORS;  Service: Cardiopulmonary;  Laterality: N/A;    FAMILY HISTORY: Family History  Problem Relation Age of Onset  . Heart attack Mother   . Heart attack Father     ADVANCED DIRECTIVES (Y/N):  N  HEALTH MAINTENANCE: Social History   Tobacco Use  . Smoking status: Former Smoker    Packs/day: 1.00    Types: Cigarettes    Quit date: 08/27/2011    Years since quitting: 8.0  . Smokeless tobacco: Never Used  Substance Use Topics  . Alcohol use: No  . Drug use: No     Colonoscopy:  PAP:  Bone density:  Lipid panel:  Allergies  Allergen Reactions  . Statins Other (See Comments)    Arthralagia    Current Outpatient Medications  Medication Sig Dispense Refill  . amiodarone (PACERONE) 200 MG tablet Take 1 tablet (200  mg total) by mouth 2 (two) times daily. 60 tablet 0  . apixaban (ELIQUIS) 5 MG TABS tablet Take 1 tablet (5 mg total) by mouth 2 (two) times daily. 60 tablet 0  . lisinopril-hydrochlorothiazide (ZESTORETIC) 20-12.5 MG tablet     . metoprolol succinate (TOPROL-XL) 50 MG 24 hr tablet Take 50 mg by mouth daily.    Marland Kitchen LABETALOL HCL PO Take by mouth as needed. For BP if not brought down by lisinopril/hctz.    . mirtazapine (REMERON) 15 MG  tablet Take by mouth.    . torsemide (DEMADEX) 20 MG tablet Take 20 mg by mouth every other day.      No current facility-administered medications for this visit.    OBJECTIVE: Vitals:   09/25/19 1006  BP: (!) 154/78  Pulse: (!) 56  Resp: 18  Temp: (!) 97 F (36.1 C)  SpO2: 100%     Body mass index is 18.72 kg/m.    ECOG FS:0 - Asymptomatic  General: Thin, no acute distress. Eyes: Pink conjunctiva, anicteric sclera. HEENT: Normocephalic, moist mucous membranes. Lungs: No audible wheezing or coughing. Heart: Regular rate and rhythm. Abdomen: Soft, nontender, no obvious distention. Musculoskeletal: No edema, cyanosis, or clubbing. Neuro: Alert, answering all questions appropriately. Cranial nerves grossly intact. Skin: No rashes or petechiae noted. Psych: Normal affect.   LAB RESULTS:  Lab Results  Component Value Date   NA 136 06/22/2019   K 3.8 06/22/2019   CL 100 06/22/2019   CO2 27 06/22/2019   GLUCOSE 158 (H) 06/22/2019   BUN 8 06/22/2019   CREATININE 1.10 09/23/2019   CALCIUM 8.1 (L) 06/22/2019   PROT 6.2 (L) 05/23/2019   ALBUMIN 3.0 (L) 05/23/2019   AST 17 05/23/2019   ALT 14 05/23/2019   ALKPHOS 78 05/23/2019   BILITOT 1.1 05/23/2019   GFRNONAA >60 06/22/2019   GFRAA >60 06/22/2019    Lab Results  Component Value Date   WBC 13.2 (H) 06/20/2019   NEUTROABS 13.7 (H) 05/23/2019   HGB 12.3 (L) 06/20/2019   HCT 36.4 (L) 06/20/2019   MCV 84.8 06/20/2019   PLT 294 06/20/2019     STUDIES: CT CHEST W CONTRAST  Result Date: 09/23/2019 CLINICAL DATA:  Lung cancer. EXAM: CT CHEST WITH CONTRAST TECHNIQUE: Multidetector CT imaging of the chest was performed during intravenous contrast administration. CONTRAST:  45mL OMNIPAQUE IOHEXOL 300 MG/ML  SOLN COMPARISON:  04/21/2019. FINDINGS: Cardiovascular: Atherosclerotic calcification of the aorta and coronary arteries. Heart size normal. No pericardial effusion. Mediastinum/Nodes: No pathologically enlarged  mediastinal, hilar or axillary lymph nodes. Esophagus is grossly unremarkable. Lungs/Pleura: There is heterogeneous collapse/consolidation in the posterior left upper lobe. The central rounded area of low attenuation measures approximately 3.1 x 3.2 cm (2/53) and likely corresponds to the mass seen on 04/21/2019 which measured approximately 3.0 x 3.6 cm at that time. Surrounding high density collapsed lung is indicative of post treatment atelectasis. Fluid is seen in some left upper lobe bronchi. Atelectasis and airspace opacification are also seen in the adjacent left lower lobe. Tiny associated loculated left pleural effusion. Centrilobular emphysema. Patchy areas of amorphous ground-glass in the lungs bilaterally, new from the prior exam and likely infectious or inflammatory in etiology. No right pleural effusion. Airway is otherwise unremarkable. Upper Abdomen: Visualized portions of the liver, gallbladder, adrenal glands, kidneys, spleen, pancreas, stomach and bowel are grossly unremarkable. No upper abdominal adenopathy. Musculoskeletal: No worrisome lytic or sclerotic lesions. Mild superior endplate compression deformities involving T4 and T5, unchanged. IMPRESSION: 1.  Post radiation volume loss and consolidation in the left upper and left lower lobes, surrounding a known left upper lobe mass, which may be minimally smaller. Trace associated left pleural effusion. 2. No evidence of metastatic disease. 3. Vague patchy areas of ground-glass in the lungs bilaterally, likely infectious or inflammatory in etiology. 4. Aortic atherosclerosis (ICD10-170.0). Coronary artery calcification. 5.  Emphysema (ICD10-J43.9). Electronically Signed   By: Lorin Picket M.D.   On: 09/23/2019 13:26    ASSESSMENT:  Adenocarcinoma of upper lobe of left lung   PLAN:   1.  Adenocarcinoma of upper lobe of left lung: Biopsy of lung nodule on November 27, 2016 revealed adenocarcinoma, but there was no invasive component on the  sample. Biopsy of mediastinal lymph node had insufficient material. Patient ultimately declined any further biopsies or surgery and elected to proceed with XRT alone which he completed in April 2018.  PET scan on January 22, 2019 reviewed independently with increased metabolic activity prior to previous with SUV increasing from 6.5 up to 8.8.  Patient declined intervention at that time and elected to proceed with simple observation.  Repeat CT scan on April 21, 2019 reviewed independently with progressive 6.4 cm mass in the left upper lobe.  Patient declined systemic treatment, but agreed to proceed with XRT which she completed in approximately August 2020.  His most recent CT scan on September 23, 2019 reviewed independently and report as above with no obvious evidence of recurrent or progressive disease.  No intervention is needed at this time.  Return to clinic in 6 months with repeat imaging and further evaluation. 2.  Shortness of breath: Patient does not complain of this today.  Continue follow-up with pulmonology as scheduled.    I spent a total of 15 minutes face-to-face with the patient and reviewing chart data of which greater than 50% of the visit was spent in counseling and coordination of care as detailed above.  Patient expressed understanding and was in agreement with this plan. He also understands that He can call clinic at any time with any questions, concerns, or complaints.     [name], MD   09/26/2019 7:56 AM

## 2019-09-23 ENCOUNTER — Ambulatory Visit
Admission: RE | Admit: 2019-09-23 | Discharge: 2019-09-23 | Disposition: A | Discharge status: Home / Self Care | Payer: Medicare HMO | Source: Ambulatory Visit | Attending: Radiation Oncology | Admitting: Radiation Oncology

## 2019-09-23 ENCOUNTER — Other Ambulatory Visit: Payer: Self-pay

## 2019-09-23 DIAGNOSIS — C3412 Malignant neoplasm of upper lobe, left bronchus or lung: Secondary | ICD-10-CM | POA: Diagnosis not present

## 2019-09-23 DIAGNOSIS — C3490 Malignant neoplasm of unspecified part of unspecified bronchus or lung: Secondary | ICD-10-CM | POA: Diagnosis not present

## 2019-09-23 LAB — POCT I-STAT CREATININE: Creatinine, Ser: 1.1 mg/dL (ref 0.61–1.24)

## 2019-09-23 MED ORDER — IOHEXOL 300 MG/ML  SOLN
75.0000 mL | Freq: Once | INTRAMUSCULAR | Status: AC | PRN
  Administered 2019-09-23: 75 mL via INTRAVENOUS

## 2019-09-24 ENCOUNTER — Other Ambulatory Visit: Payer: Self-pay

## 2019-09-24 ENCOUNTER — Encounter: Payer: Self-pay | Admitting: Oncology

## 2019-09-24 NOTE — Progress Notes (Signed)
Patient is here for follow-up, no complaints or concerns.

## 2019-09-25 ENCOUNTER — Other Ambulatory Visit: Payer: Self-pay | Admitting: *Deleted

## 2019-09-25 ENCOUNTER — Other Ambulatory Visit: Payer: Self-pay

## 2019-09-25 ENCOUNTER — Inpatient Hospital Stay: Payer: Medicare HMO | Attending: Oncology | Admitting: Oncology

## 2019-09-25 ENCOUNTER — Ambulatory Visit
Admission: RE | Admit: 2019-09-25 | Discharge: 2019-09-25 | Disposition: A | Discharge status: Home / Self Care | Payer: Medicare HMO | Source: Ambulatory Visit | Attending: Radiation Oncology | Admitting: Radiation Oncology

## 2019-09-25 VITALS — BP 141/76 | HR 56 | Temp 97.0°F | Wt 135.4 lb

## 2019-09-25 VITALS — BP 154/78 | HR 56 | Temp 97.0°F | Resp 18 | Wt 134.2 lb

## 2019-09-25 DIAGNOSIS — C3412 Malignant neoplasm of upper lobe, left bronchus or lung: Secondary | ICD-10-CM

## 2019-09-25 DIAGNOSIS — Z87891 Personal history of nicotine dependence: Secondary | ICD-10-CM | POA: Diagnosis not present

## 2019-09-25 DIAGNOSIS — I252 Old myocardial infarction: Secondary | ICD-10-CM | POA: Insufficient documentation

## 2019-09-25 DIAGNOSIS — I10 Essential (primary) hypertension: Secondary | ICD-10-CM | POA: Diagnosis not present

## 2019-09-25 DIAGNOSIS — Z923 Personal history of irradiation: Secondary | ICD-10-CM | POA: Diagnosis not present

## 2019-09-25 NOTE — Progress Notes (Signed)
Radiation Oncology Follow up Note  Name: Scott Gross   Date:   09/25/2019 MRN:  [id number] DOB: 1934/03/05    This 85 y.o. male presents to the clinic today for 38-month follow-up status post SBRT as salvage treatment previously treated area treated 2 years prior for SBRT for stage I adenocarcinoma.  REFERRING PROVIDER: [name], MD  HPI: Patient is an 85 year old male now out 4 months having repeat completed salvage radiation therapy to his left lung and area previously treated with SBRT for stage I adenocarcinoma proximally 2 years prior.  He is seen today in routine follow-up is doing well he specifically denies cough hemoptysis or chest tightness..  He had a recent CT scan showed post radiation volume loss and consolidation left upper and left lower lobes.  No evidence of metastatic disease or progression of disease was noted.  COMPLICATIONS OF TREATMENT: none  FOLLOW UP COMPLIANCE: keeps appointments   PHYSICAL EXAM:  BP (!) 141/76   Pulse (!) 56   Temp (!) 97 F (36.1 C)   Wt 135 lb 6.4 oz (61.4 kg)   BMI 18.88 kg/m  Well-developed well-nourished patient in NAD. HEENT reveals PERLA, EOMI, discs not visualized.  Oral cavity is clear. No oral mucosal lesions are identified. Neck is clear without evidence of cervical or supraclavicular adenopathy. Lungs are clear to A&P. Cardiac examination is essentially unremarkable with regular rate and rhythm without murmur rub or thrill. Abdomen is benign with no organomegaly or masses noted. Motor sensory and DTR levels are equal and symmetric in the upper and lower extremities. Cranial nerves II through XII are grossly intact. Proprioception is intact. No peripheral adenopathy or edema is identified. No motor or sensory levels are noted. Crude visual fields are within normal range.  RADIOLOGY RESULTS: CT scans reviewed compatible with above-stated findings  PLAN: Present time patient is doing well I have reviewed his CT scans show  stable findings in the chest.  I am otherwise pleased with his overall progress.  He is has a very low side effect profile.  I have asked to see him back in 6 months with a CT scan prior to that visit.  Patient knows to call sooner with any concerns.  I would like to take this opportunity to thank you for allowing me to participate in the care of your patient.Noreene Filbert, MD

## 2019-09-28 DIAGNOSIS — G609 Hereditary and idiopathic neuropathy, unspecified: Secondary | ICD-10-CM | POA: Diagnosis not present

## 2019-09-28 DIAGNOSIS — E119 Type 2 diabetes mellitus without complications: Secondary | ICD-10-CM | POA: Diagnosis not present

## 2019-09-28 DIAGNOSIS — I10 Essential (primary) hypertension: Secondary | ICD-10-CM | POA: Diagnosis not present

## 2019-09-28 DIAGNOSIS — Z87891 Personal history of nicotine dependence: Secondary | ICD-10-CM | POA: Diagnosis not present

## 2019-09-28 DIAGNOSIS — I480 Paroxysmal atrial fibrillation: Secondary | ICD-10-CM | POA: Diagnosis not present

## 2019-09-28 DIAGNOSIS — E785 Hyperlipidemia, unspecified: Secondary | ICD-10-CM | POA: Diagnosis not present

## 2019-09-28 DIAGNOSIS — Z125 Encounter for screening for malignant neoplasm of prostate: Secondary | ICD-10-CM | POA: Diagnosis not present

## 2019-09-30 ENCOUNTER — Ambulatory Visit: Payer: Medicare HMO | Admitting: Radiation Oncology

## 2019-09-30 DIAGNOSIS — R000 Tachycardia, unspecified: Secondary | ICD-10-CM | POA: Diagnosis not present

## 2019-09-30 DIAGNOSIS — I208 Other forms of angina pectoris: Secondary | ICD-10-CM | POA: Diagnosis not present

## 2019-09-30 DIAGNOSIS — R55 Syncope and collapse: Secondary | ICD-10-CM | POA: Diagnosis not present

## 2019-09-30 DIAGNOSIS — I739 Peripheral vascular disease, unspecified: Secondary | ICD-10-CM | POA: Diagnosis not present

## 2019-09-30 DIAGNOSIS — R531 Weakness: Secondary | ICD-10-CM | POA: Diagnosis not present

## 2019-09-30 DIAGNOSIS — I701 Atherosclerosis of renal artery: Secondary | ICD-10-CM | POA: Diagnosis not present

## 2019-09-30 DIAGNOSIS — E119 Type 2 diabetes mellitus without complications: Secondary | ICD-10-CM | POA: Diagnosis not present

## 2019-09-30 DIAGNOSIS — I480 Paroxysmal atrial fibrillation: Secondary | ICD-10-CM | POA: Diagnosis not present

## 2019-09-30 DIAGNOSIS — I10 Essential (primary) hypertension: Secondary | ICD-10-CM | POA: Diagnosis not present

## 2019-10-01 ENCOUNTER — Other Ambulatory Visit: Payer: Self-pay

## 2019-10-01 ENCOUNTER — Encounter: Payer: Self-pay | Admitting: Emergency Medicine

## 2019-10-01 ENCOUNTER — Emergency Department: Payer: Medicare HMO

## 2019-10-01 ENCOUNTER — Emergency Department
Admission: EM | Admit: 2019-10-01 | Discharge: 2019-10-01 | Disposition: A | Discharge status: Home / Self Care | Payer: Medicare HMO | Attending: Student | Admitting: Student

## 2019-10-01 DIAGNOSIS — Z87891 Personal history of nicotine dependence: Secondary | ICD-10-CM | POA: Insufficient documentation

## 2019-10-01 DIAGNOSIS — Z85118 Personal history of other malignant neoplasm of bronchus and lung: Secondary | ICD-10-CM | POA: Insufficient documentation

## 2019-10-01 DIAGNOSIS — I10 Essential (primary) hypertension: Secondary | ICD-10-CM | POA: Insufficient documentation

## 2019-10-01 DIAGNOSIS — R0789 Other chest pain: Secondary | ICD-10-CM | POA: Diagnosis not present

## 2019-10-01 DIAGNOSIS — I2510 Atherosclerotic heart disease of native coronary artery without angina pectoris: Secondary | ICD-10-CM | POA: Diagnosis not present

## 2019-10-01 DIAGNOSIS — Z79899 Other long term (current) drug therapy: Secondary | ICD-10-CM | POA: Insufficient documentation

## 2019-10-01 DIAGNOSIS — R079 Chest pain, unspecified: Secondary | ICD-10-CM | POA: Diagnosis not present

## 2019-10-01 DIAGNOSIS — Z7901 Long term (current) use of anticoagulants: Secondary | ICD-10-CM | POA: Diagnosis not present

## 2019-10-01 LAB — CBC
HCT: 47.2 % (ref 39.0–52.0)
Hemoglobin: 15.9 g/dL (ref 13.0–17.0)
MCH: 29.7 pg (ref 26.0–34.0)
MCHC: 33.7 g/dL (ref 30.0–36.0)
MCV: 88.2 fL (ref 80.0–100.0)
Platelets: 216 10*3/uL (ref 150–400)
RBC: 5.35 MIL/uL (ref 4.22–5.81)
RDW: 15.6 % — ABNORMAL HIGH (ref 11.5–15.5)
WBC: 9.8 10*3/uL (ref 4.0–10.5)
nRBC: 0 % (ref 0.0–0.2)

## 2019-10-01 LAB — BASIC METABOLIC PANEL
Anion gap: 9 (ref 5–15)
BUN: 12 mg/dL (ref 8–23)
CO2: 27 mmol/L (ref 22–32)
Calcium: 8.7 mg/dL — ABNORMAL LOW (ref 8.9–10.3)
Chloride: 100 mmol/L (ref 98–111)
Creatinine, Ser: 1 mg/dL (ref 0.61–1.24)
GFR calc Af Amer: >60 mL/min (ref >60)
GFR calc non Af Amer: >60 mL/min (ref >60)
Glucose, Bld: 109 mg/dL — ABNORMAL HIGH (ref 70–99)
Potassium: 3.9 mmol/L (ref 3.5–5.1)
Sodium: 136 mmol/L (ref 135–145)

## 2019-10-01 LAB — TROPONIN I (HIGH SENSITIVITY)
Troponin I (High Sensitivity): 6 ng/L (ref <18)
Troponin I (High Sensitivity): 6 ng/L (ref <18)

## 2019-10-01 NOTE — ED Triage Notes (Signed)
Patient presents to the ED with left sided sharp chest pain that began around 2:30pm while patient was dusting his home.  Patient states his chest pain has now resolved as of about 15 min ago.  Patient is in no obvious distress at this time.  Reports history of cardiac problems including history of cardiac stent placement.

## 2019-10-01 NOTE — Discharge Instructions (Addendum)
Thank you for letting us take care of you in the emergency department today.   Please continue to take any regular, prescribed medications.   Please follow up with: - Your cardiology doctor to review your ER visit and follow up on your symptoms.   Please return to the ER for any new or worsening symptoms.

## 2019-10-01 NOTE — ED Provider Notes (Signed)
[facility] Emergency Department Provider Note  ____________________________________________   First MD Initiated Contact with Patient 10/01/19 1855     (approximate)  I have reviewed the triage vital signs and the nursing notes.  History  Chief Complaint Chest Pain    HPI Scott Gross is a 85 y.o. male with hx of CAD who presents for a brief episode of now resolved chest pain. Patient states this afternoon he had an episode of left sided chest pain. Pain was sharp, constant, moderate in severity. No alleviating/aggravating components. No radiation. No associated nausea, diaphoresis, or SOB. Pain is now resolved and he is asymptomatic. He reports compliance with all his medications, including his blood pressure medications and his anticoagulation.    Past Medical Hx Past Medical History:  Diagnosis Date  . Arthritis   . CAD (coronary artery disease) Nov. 12, 2012   Inferior ST elevation MI in November of 2012 with ventricular fibrillation arrest. Status post drug-eluting stent placement to the mid LAD. Most recent cardiac catheterization in June of this year showed patent stent with minimal restenosis, 75% proximal LAD and 80% distal LAD which were unchanged from before. Mild disease in the left circumflex and moderate RCA disease. Ejection fraction was 55%.  . Cancer of upper lobe of left lung ([place]) 11/2016   Rad tx's.  . Cardiac arrest [facility]) Nov. 2012   x 2   . Cardiac arrest - ventricular fibrillation   . HOH (hard of hearing)    Left Hearing Aid  . Hyperlipidemia   . Hypertension   . Post PTCA 2013   x2  . ST elevation MI (STEMI) ([place]) 08/26/2011    Problem List Patient Active Problem List   Diagnosis Date Noted  . Malnutrition of moderate degree 06/22/2019  . CAP (community acquired pneumonia) 06/21/2019  . SIRS (systemic inflammatory response syndrome) ([place]) 05/23/2019  . Atrial fibrillation with RVR ([place])   . PAF (paroxysmal atrial  fibrillation) ([place]) 05/20/2019  . Cardiac arrest ([place])   . Diabetes mellitus type 2, uncomplicated ([place]) 47/42/5956  . Angina pectoris ([place]) 12/17/2016  . Adenopathy   . Cancer of upper lobe of left lung ([place]) 09/10/2016  . Hyperlipidemia   . Hypertension   . CAD (coronary artery disease) 08/27/2011  . Myocardial infarction ([place]) 08/16/2011    Past Surgical Hx Past Surgical History:  Procedure Laterality Date  . CARDIAC CATHETERIZATION  2013   @ [place]; [facility] s/p stent   . CORONARY ANGIOPLASTY    . ELECTROMAGNETIC NAVIGATION BROCHOSCOPY N/A 11/27/2016   Procedure: ELECTROMAGNETIC NAVIGATION BRONCHOSCOPY;  Surgeon: [name], MD;  Location: [facility] ORS;  Service: Cardiopulmonary;  Laterality: N/A;    Medications Prior to Admission medications   Medication Sig Start Date End Date Taking? Authorizing Provider  amLODipine (NORVASC) 5 MG tablet Take 5 mg by mouth 2 (two) times daily as needed.    Yes [provider]  apixaban (ELIQUIS) 5 MG TABS tablet Take 1 tablet (5 mg total) by mouth 2 (two) times daily. 05/21/19  Yes Mody, Ulice Bold, MD  labetalol (NORMODYNE) 100 MG tablet Take 100 mg by mouth daily.    Yes [provider]  metoprolol succinate (TOPROL-XL) 50 MG 24 hr tablet Take 50 mg by mouth daily. 05/30/19  Yes [provider]    Allergies Statins  Family Hx Family History  Problem Relation Age of Onset  . Heart attack Mother   . Heart attack Father     Social Hx Social  History   Tobacco Use  . Smoking status: Former Smoker    Packs/day: 1.00    Types: Cigarettes    Quit date: 08/27/2011    Years since quitting: 8.1  . Smokeless tobacco: Never Used  Substance Use Topics  . Alcohol use: No  . Drug use: No     Review of Systems  Constitutional: Negative for fever, chills. Eyes: Negative for visual changes. ENT: Negative for sore throat. Cardiovascular: + for chest pain. Respiratory: Negative for shortness of breath.  Gastrointestinal: Negative for nausea, vomiting.  Genitourinary: Negative for dysuria. Musculoskeletal: Negative for leg swelling. Skin: Negative for rash. Neurological: Negative for for headaches.   Physical Exam  Vital Signs: ED Triage Vitals  Enc Vitals Group     BP 10/01/19 1643 (!) 188/87     Pulse Rate 10/01/19 1643 (!) 59     Resp 10/01/19 1643 17     Temp 10/01/19 1643 97.7 F (36.5 C)     Temp Source 10/01/19 1643 Oral     SpO2 10/01/19 1643 100 %     Weight 10/01/19 1644 137 lb (62.1 kg)     Height 10/01/19 1644 5\' 8"  (1.727 m)     Head Circumference --      Peak Flow --      Pain Score 10/01/19 1649 0     Pain Loc --      Pain Edu? --      Excl. in [place]? --     Constitutional: Alert and oriented.  Head: Normocephalic. Atraumatic. Eyes: Conjunctivae clear. Sclera anicteric. Nose: No congestion. No rhinorrhea. Mouth/Throat: Wearing mask.  Neck: No stridor.   Cardiovascular: Normal rate, regular rhythm. Extremities well perfused. Respiratory: Normal respiratory effort.  Lungs CTAB. Gastrointestinal: Soft. Non-tender. Non-distended.  Musculoskeletal: No lower extremity edema. No deformities. Neurologic:  Normal speech and language. No gross focal neurologic deficits are appreciated.  Skin: Skin is warm, dry and intact. No rash noted. Psychiatric: Mood and affect are appropriate for situation.  EKG  Personally reviewed.   Rate: 61 Rhythm: sinus Axis: normal Intervals: WNL No acute ischemic changes No STEMI    Radiology  CXR: IMPRESSION:  1. No active cardiopulmonary disease.  2. Similar appearing posterior left upper lobe lung mass with  surrounding radiation changes when compared to recent CT.    Procedures  Procedure(s) performed (including critical care):  Procedures   Initial Impression / Assessment and Plan / ED Course  85 y.o. male who presents to the ED for chest pain, as above. Currently asymptomatic.   Ddx: ACS, MSK, pleurisy,  GERD.  He does have a history of cancer, however very low suspicion for PE given no tachycardia, hypoxia, tachypnea, and he has been compliant with his anticoagulation.  Obtain labs, imaging, EKG and reassess.  EKG w/o acute ischemic changes. Troponin x 2 negative. XR stable. He remains asymptomatic here. As such, given negative work up, patient stable for discharge w/ outpatient follow up. He is agreeable with plan. Discussed return precautions.   Final Clinical Impression(s) / ED Diagnosis  Final diagnoses:  Chest pain in adult       Note:  This document was prepared using Dragon voice recognition software and may include unintentional dictation errors.   Lilia Pro., MD 10/02/19 1153

## 2019-10-01 NOTE — ED Notes (Signed)
Pt discharged home, instructions reviewed and verbalized understanding.

## 2019-10-01 NOTE — ED Triage Notes (Signed)
Per daughter, pt had meds changed by his MD. Pt now takes:  Eliquis 5mg  bid Metoprolol Succ XL 50mg  qd,  labetolol 100mg  qd Amlodipine 5mg  bid prn   Pt took an amlodipine today at 15:30

## 2019-10-02 DIAGNOSIS — I208 Other forms of angina pectoris: Secondary | ICD-10-CM | POA: Diagnosis not present

## 2019-10-02 DIAGNOSIS — R000 Tachycardia, unspecified: Secondary | ICD-10-CM | POA: Diagnosis not present

## 2019-10-02 DIAGNOSIS — I739 Peripheral vascular disease, unspecified: Secondary | ICD-10-CM | POA: Diagnosis not present

## 2019-10-02 DIAGNOSIS — I701 Atherosclerosis of renal artery: Secondary | ICD-10-CM | POA: Diagnosis not present

## 2019-10-02 DIAGNOSIS — E785 Hyperlipidemia, unspecified: Secondary | ICD-10-CM | POA: Diagnosis not present

## 2019-10-02 DIAGNOSIS — I10 Essential (primary) hypertension: Secondary | ICD-10-CM | POA: Diagnosis not present

## 2019-10-02 DIAGNOSIS — R55 Syncope and collapse: Secondary | ICD-10-CM | POA: Diagnosis not present

## 2019-10-02 DIAGNOSIS — E119 Type 2 diabetes mellitus without complications: Secondary | ICD-10-CM | POA: Diagnosis not present

## 2019-10-02 DIAGNOSIS — I480 Paroxysmal atrial fibrillation: Secondary | ICD-10-CM | POA: Diagnosis not present

## 2019-10-13 DIAGNOSIS — E538 Deficiency of other specified B group vitamins: Secondary | ICD-10-CM | POA: Diagnosis not present

## 2019-10-20 DIAGNOSIS — E538 Deficiency of other specified B group vitamins: Secondary | ICD-10-CM | POA: Diagnosis not present

## 2019-10-27 DIAGNOSIS — E538 Deficiency of other specified B group vitamins: Secondary | ICD-10-CM | POA: Diagnosis not present

## 2019-11-03 DIAGNOSIS — E538 Deficiency of other specified B group vitamins: Secondary | ICD-10-CM | POA: Diagnosis not present

## 2020-01-02 IMAGING — DX DG CHEST 1V PORT
1 series · 1 of 1 positions shown · non-contrast
Comparison: May 23, 2019

CLINICAL DATA: Dizziness

EXAM:
PORTABLE CHEST 1 VIEW

[chest ap]
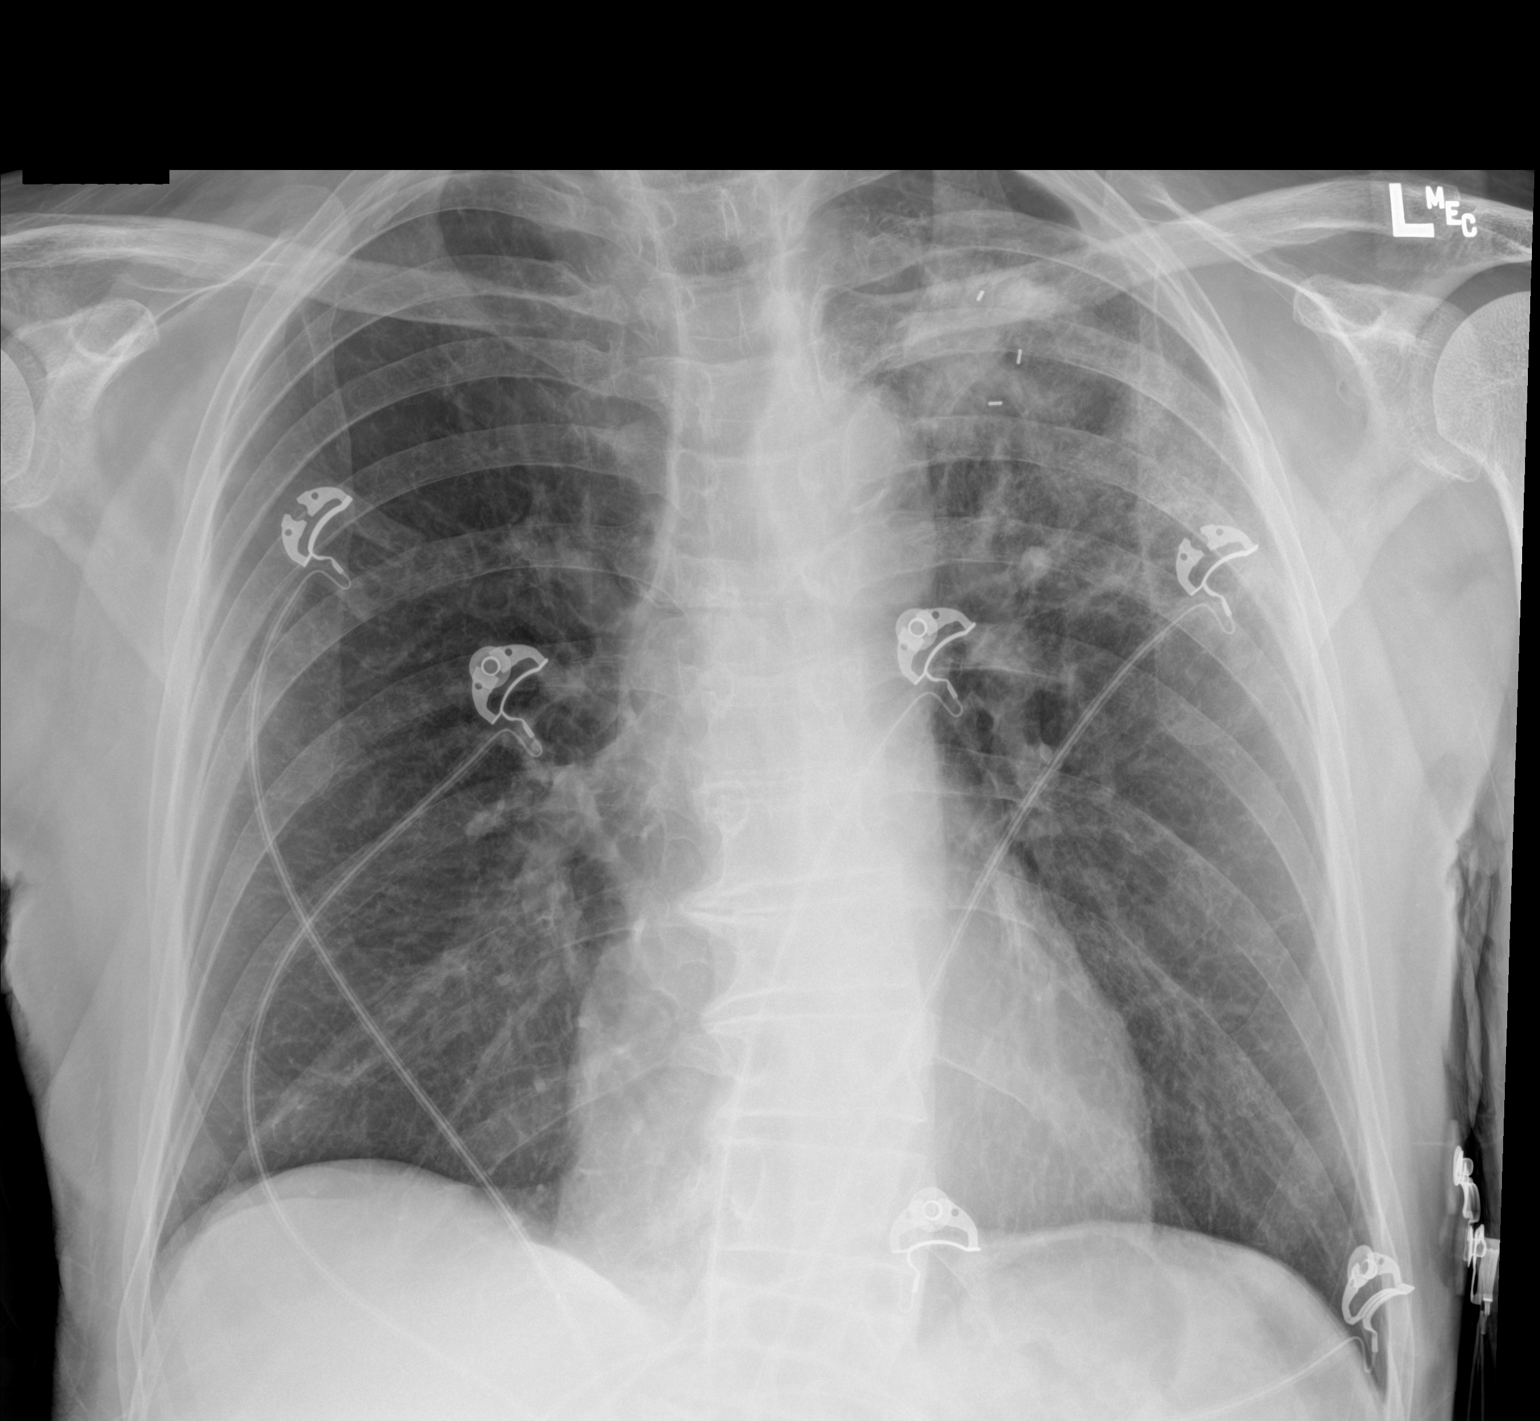

[1 of 1 positions shown; findings below may reference images not displayed]

FINDINGS: There is a left upper pulmonary mass as on prior exams. There
appears to be subtly increased hazy airspace opacity adjacent to the
mass. The right lung remains clear. There is hyperinflation of the
upper lung zones. The cardiomediastinal silhouette is unchanged.
Surgical clips seen within the left upper hemithorax.
IMPRESSION: Left upper lung mass with question of mildly increased surrounding
hazy airspace opacity. This could be due to adjacent atelectasis or
early infectious etiology.

## 2020-01-06 ENCOUNTER — Telehealth: Payer: Self-pay | Admitting: Emergency Medicine

## 2020-01-06 ENCOUNTER — Other Ambulatory Visit: Payer: Self-pay

## 2020-01-06 ENCOUNTER — Ambulatory Visit
Admission: RE | Admit: 2020-01-06 | Discharge: 2020-01-06 | Disposition: A | Discharge status: Home / Self Care | Payer: Medicare HMO | Source: Ambulatory Visit | Attending: Radiation Oncology | Admitting: Radiation Oncology

## 2020-01-06 DIAGNOSIS — C3490 Malignant neoplasm of unspecified part of unspecified bronchus or lung: Secondary | ICD-10-CM | POA: Diagnosis not present

## 2020-01-06 DIAGNOSIS — C3412 Malignant neoplasm of upper lobe, left bronchus or lung: Secondary | ICD-10-CM | POA: Diagnosis not present

## 2020-01-06 LAB — POCT I-STAT CREATININE: Creatinine, Ser: 1.1 mg/dL (ref 0.61–1.24)

## 2020-01-06 MED ORDER — IOHEXOL 300 MG/ML  SOLN
60.0000 mL | Freq: Once | INTRAMUSCULAR | Status: AC | PRN
  Administered 2020-01-06: 11:00:00 60 mL via INTRAVENOUS

## 2020-01-06 NOTE — Telephone Encounter (Signed)
Returned patient's call to Scott Gross this morning regarding request to change chest CT to shoulder CT. Pt reports that he's been having bilateral shoulder pain, but it has been ongoing for 7 or 8 years and has been evaluated by several doctors, including his PCP and an orthopedist. Pt reports that he's been told that he has arthritis, but other doctors have told him he doesn't have arthritis. Pt reports that he has been trying tylenol extra strength with minimal relief. Explained to pt that while we wouldn't be doing a dedicated CT of his shoulders today, most of the time most of the shoulders show up in the field of view in a chest CT, and we would be happy to discuss this pain with him when he comes in next week to get the results of his CT. Pt verbalized understanding and didn't have any further questions or concerns.

## 2020-01-09 NOTE — Progress Notes (Signed)
[place]  [phone]) [phone] Fax:(336) [phone]  ID: JULIE PAOLINI OB: [birth year]  MR#: [id number]  FIE#:[id number]  Patient Care Team: [name], MD as PCP - General (Family Medicine) [name], MD as Consulting Physician (Oncology)   CHIEF COMPLAINT: Adenocarcinoma of upper lobe of left lung.  INTERVAL HISTORY: Patient returns to clinic today as an add-on to discuss his CT scan results.  Imaging was moved up several months secondary to patient complaint of chronic upper back pain.  He continues to have chronic weakness and fatigue, but otherwise feels well.  He has a good appetite, but continues to have trouble gaining weight.  He otherwise feels well.  He has no neurologic complaints. He denies any chest pain, cough, hemoptysis, or shortness of breath.  He has no nausea, vomiting, constipation, or diarrhea. He has no urinary complaints.  Patient offers no further specific complaints today.  REVIEW OF SYSTEMS:   Review of Systems  Constitutional: Positive for malaise/fatigue. Negative for fever and weight loss.  Respiratory: Positive for shortness of breath. Negative for cough and hemoptysis.   Cardiovascular: Negative.  Negative for chest pain and leg swelling.  Gastrointestinal: Negative.  Negative for abdominal pain.  Genitourinary: Negative.  Negative for dysuria.  Musculoskeletal: Positive for back pain. Negative for joint pain.  Skin: Negative.  Negative for rash.  Neurological: Positive for weakness. Negative for sensory change, focal weakness and headaches.  Psychiatric/Behavioral: Negative.  The patient is not nervous/anxious.     As per HPI. Otherwise, a complete review of systems is negative.  PAST MEDICAL HISTORY: Past Medical History:  Diagnosis Date  . Arthritis   . CAD (coronary artery disease) Nov. 12, 2012   Inferior ST elevation MI in November of 2012 with ventricular fibrillation arrest. Status post drug-eluting stent  placement to the mid LAD. Most recent cardiac catheterization in June of this year showed patent stent with minimal restenosis, 75% proximal LAD and 80% distal LAD which were unchanged from before. Mild disease in the left circumflex and moderate RCA disease. Ejection fraction was 55%.  . Cancer of upper lobe of left lung ([place]) 11/2016   Rad tx's.  . Cardiac arrest [facility]) Nov. 2012   x 2   . Cardiac arrest - ventricular fibrillation   . HOH (hard of hearing)    Left Hearing Aid  . Hyperlipidemia   . Hypertension   . Post PTCA 2013   x2  . ST elevation MI (STEMI) ([place]) 08/26/2011    PAST SURGICAL HISTORY: Past Surgical History:  Procedure Laterality Date  . CARDIAC CATHETERIZATION  2013   @ [place]; [facility] s/p stent   . CORONARY ANGIOPLASTY    . ELECTROMAGNETIC NAVIGATION BROCHOSCOPY N/A 11/27/2016   Procedure: ELECTROMAGNETIC NAVIGATION BRONCHOSCOPY;  Surgeon: [name], MD;  Location: [facility] ORS;  Service: Cardiopulmonary;  Laterality: N/A;    FAMILY HISTORY: Family History  Problem Relation Age of Onset  . Heart attack Mother   . Heart attack Father     ADVANCED DIRECTIVES (Y/N):  N  HEALTH MAINTENANCE: Social History   Tobacco Use  . Smoking status: Former Smoker    Packs/day: 1.00    Types: Cigarettes    Quit date: 08/27/2011    Years since quitting: 8.3  . Smokeless tobacco: Never Used  Substance Use Topics  . Alcohol use: No  . Drug use: No     Colonoscopy:  PAP:  Bone density:  Lipid panel:  Allergies  Allergen Reactions  .  Statins Other (See Comments)    Arthralagia    Current Outpatient Medications  Medication Sig Dispense Refill  . amLODipine (NORVASC) 5 MG tablet Take 5 mg by mouth 2 (two) times daily as needed.     Marland Kitchen apixaban (ELIQUIS) 5 MG TABS tablet Take 1 tablet (5 mg total) by mouth 2 (two) times daily. 60 tablet 0  . labetalol (NORMODYNE) 100 MG tablet Take 100 mg by mouth daily.     . metoprolol succinate (TOPROL-XL) 50 MG 24 hr tablet  Take 50 mg by mouth daily.     No current facility-administered medications for this visit.    OBJECTIVE: Vitals:   01/12/20 1006  BP: 112/73  Pulse: 64  Resp: 20  Temp: (!) 96.3 F (35.7 C)  SpO2: 100%     Body mass index is 20.44 kg/m.    ECOG FS:0 - Asymptomatic  General: Thin, no acute distress. Eyes: Pink conjunctiva, anicteric sclera. HEENT: Normocephalic, moist mucous membranes. Lungs: No audible wheezing or coughing. Heart: Regular rate and rhythm. Abdomen: Soft, nontender, no obvious distention. Musculoskeletal: No edema, cyanosis, or clubbing. Neuro: Alert, answering all questions appropriately. Cranial nerves grossly intact. Skin: No rashes or petechiae noted. Psych: Normal affect.  LAB RESULTS:  Lab Results  Component Value Date   NA 136 10/01/2019   K 3.9 10/01/2019   CL 100 10/01/2019   CO2 27 10/01/2019   GLUCOSE 109 (H) 10/01/2019   BUN 12 10/01/2019   CREATININE 1.10 01/06/2020   CALCIUM 8.7 (L) 10/01/2019   PROT 6.2 (L) 05/23/2019   ALBUMIN 3.0 (L) 05/23/2019   AST 17 05/23/2019   ALT 14 05/23/2019   ALKPHOS 78 05/23/2019   BILITOT 1.1 05/23/2019   GFRNONAA >60 10/01/2019   GFRAA >60 10/01/2019    Lab Results  Component Value Date   WBC 9.8 10/01/2019   NEUTROABS 13.7 (H) 05/23/2019   HGB 15.9 10/01/2019   HCT 47.2 10/01/2019   MCV 88.2 10/01/2019   PLT 216 10/01/2019     STUDIES: CT Chest W Contrast  Result Date: 01/06/2020 CLINICAL DATA:  Non-small cell lung cancer recurrence, assess treatment response, history of radiation therapy in 2018 EXAM: CT CHEST WITH CONTRAST TECHNIQUE: Multidetector CT imaging of the chest was performed during intravenous contrast administration. CONTRAST:  59mL OMNIPAQUE IOHEXOL 300 MG/ML  SOLN COMPARISON:  09/23/2019 FINDINGS: Cardiovascular: Calcified coronary artery disease similar to the prior study. Heart size is stable. Aortic caliber is stable with calcified and noncalcified plaque throughout the  thoracic aorta. Central pulmonary arteries are unremarkable. Left hilar distortion due to post treatment changes. Mediastinum/Nodes: Mildly patulous appearance of the esophagus. Mild circumferential thickening. Left perihilar no axillary adenopathy. No thoracic inlet adenopathy. Volume loss without new soft tissue or mediastinal adenopathy. Lungs/Pleura: Fiducial markers in the central portion of collapsed lung in the left chest. Overall this area measuring approximately 5.2 x 3.9 cm as compared to 5.6 x 4.6 cm on the previous exam. This includes collapsed lung around an area of low attenuation, difficult to establish boundaries in the setting of post treatment changes. Pulmonary emphysema as before. Emphysema worse at the lung apices with both paraseptal and centrilobular components. No consolidation. New spiculated nodule in the right lower lobe measuring 9 by 7 mm (image 98 of series 3). No pleural effusion. No fissural nodularity. Upper Abdomen: Adrenal glands are normal. Imaged portions of the liver, kidneys and spleen are unremarkable. No acute process related to the pancreas. 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 Musculoskeletal: No chest wall mass. No destructive bone finding. Subtle increase in sclerosis along the left superolateral corner of the T10 vertebral body with rounded appearance despite the presence of degenerative change (image 95, series 6) area of sclerosis measuring approximately 1 cm best seen on sagittal views. IMPRESSION: 1. New spiculated nodule in the right lower lobe measuring up to 9 mm is suspicious for metastatic disease or metachronous primary. 2. Subtle increase in sclerosis along the left superolateral corner of the T10 vertebral body with rounded appearance suspicious for metastatic disease. Given above findings PET scan may be helpful. 3. Post treatment changes in the left chest similar to the prior exam. 4. 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 5. Calcified coronary artery disease. These results will be called to the ordering clinician or representative by the Radiologist Assistant, and communication documented in the PACS or [employer]. Aortic Atherosclerosis (ICD10-I70.0) and Emphysema (ICD10-J43.9). Electronically Signed   By: Zetta Bills M.D.   On: 01/06/2020 14:09    ASSESSMENT:  Adenocarcinoma of upper lobe of left lung   PLAN:   1.  Adenocarcinoma of upper lobe of left lung: Biopsy of lung nodule on November 27, 2016 revealed adenocarcinoma, but there was no invasive component on the sample. Biopsy of mediastinal lymph node had insufficient material. Patient ultimately declined any further biopsies or surgery and elected to proceed with XRT alone which he completed in April 2018.  PET scan on January 22, 2019 reviewed independently with increased metabolic activity prior to previous with SUV increasing from 6.5 up to 8.8.  Patient declined intervention at that time and elected to proceed with simple observation.  Repeat CT scan on April 21, 2019 reviewed independently with progressive 6.4 cm mass in the left upper lobe.  Patient declined systemic treatment, but agreed to proceed with XRT which she completed in approximately August 2020.  CT scan on September 23, 2019 reviewed independently with no obvious evidence of recurrent or progressive disease.  Follow-up CT scan on January 06, 2020 reviewed independently and reported as above with a new right lower lobe 9 mm spiculated nodule.  There is also a subtle increase in sclerosis in the T10 vertebral body.  While neither of these explain patient's ongoing back pain, given his history, we will get a PET scan to further evaluate.  Return to clinic 1 to 2 days later to discuss the results.  2.  Back pain: Unclear etiology patient reports it has been present for 7 or 8 years.  PET scan as above.  If no obvious etiology from imaging, will  recommend MRI of the thoracic spine.  I spent a total of 30 minutes reviewing chart data, face-to-face evaluation with the patient, counseling and coordination of care as detailed above.   Patient expressed understanding and was in agreement with this plan. He also understands that He can call clinic at any time with any questions, concerns, or complaints.     [name], MD   01/12/2020 12:34 PM

## 2020-01-12 ENCOUNTER — Inpatient Hospital Stay: Payer: Medicare HMO | Attending: Oncology | Admitting: Oncology

## 2020-01-12 ENCOUNTER — Encounter: Payer: Self-pay | Admitting: Oncology

## 2020-01-12 VITALS — BP 112/73 | HR 64 | Temp 96.3°F | Resp 20 | Wt 134.4 lb

## 2020-01-12 DIAGNOSIS — Z923 Personal history of irradiation: Secondary | ICD-10-CM | POA: Insufficient documentation

## 2020-01-12 DIAGNOSIS — Z79899 Other long term (current) drug therapy: Secondary | ICD-10-CM | POA: Insufficient documentation

## 2020-01-12 DIAGNOSIS — Z87891 Personal history of nicotine dependence: Secondary | ICD-10-CM | POA: Insufficient documentation

## 2020-01-12 DIAGNOSIS — M549 Dorsalgia, unspecified: Secondary | ICD-10-CM | POA: Insufficient documentation

## 2020-01-12 DIAGNOSIS — I10 Essential (primary) hypertension: Secondary | ICD-10-CM | POA: Insufficient documentation

## 2020-01-12 DIAGNOSIS — I2510 Atherosclerotic heart disease of native coronary artery without angina pectoris: Secondary | ICD-10-CM | POA: Insufficient documentation

## 2020-01-12 DIAGNOSIS — C3412 Malignant neoplasm of upper lobe, left bronchus or lung: Secondary | ICD-10-CM | POA: Insufficient documentation

## 2020-01-12 NOTE — Progress Notes (Signed)
Patient complains of mid back and shoulder pain. Patient would like to discuss x-ray of shoulders.

## 2020-01-15 NOTE — Progress Notes (Signed)
[place]  [phone] [phone]  ID: Scott Gross OB: Feb 04, 1934  MR#: [id number]  NLG#:[id number]  Patient Care Team: [name], MD as PCP - General (Family Medicine) [name], MD as Consulting Physician (Oncology)   CHIEF COMPLAINT: Adenocarcinoma of upper lobe of left lung.  INTERVAL HISTORY: Patient returns to clinic today for further evaluation and discussion of his PET scan results.  He continues to have intermittent upper back pain that has been present for years.  He also continues to have chronic weakness and fatigue.  He has a fair appetite and denies weight loss.  He has no neurologic complaints. He denies any chest pain, cough, hemoptysis, or shortness of breath. He has no nausea, vomiting, constipation, or diarrhea. He has no urinary complaints.  Patient offers no further specific complaints today.  REVIEW OF SYSTEMS:   Review of Systems  Constitutional: Positive for malaise/fatigue. Negative for fever and weight loss.  Respiratory: Negative.  Negative for cough, hemoptysis and shortness of breath.   Cardiovascular: Negative.  Negative for chest pain and leg swelling.  Gastrointestinal: Negative.  Negative for abdominal pain.  Genitourinary: Negative.  Negative for dysuria.  Musculoskeletal: Positive for back pain. Negative for joint pain.  Skin: Negative.  Negative for rash.  Neurological: Positive for weakness. Negative for sensory change, focal weakness and headaches.  Psychiatric/Behavioral: Negative.  The patient is not nervous/anxious.     As per HPI. Otherwise, a complete review of systems is negative.  PAST MEDICAL HISTORY: Past Medical History:  Diagnosis Date  . Arthritis   . CAD (coronary artery disease) Nov. 12, 2012   Inferior ST elevation MI in November of 2012 with ventricular fibrillation arrest. Status post drug-eluting stent placement to the mid LAD. Most recent cardiac catheterization in  June of this year showed patent stent with minimal restenosis, 75% proximal LAD and 80% distal LAD which were unchanged from before. Mild disease in the left circumflex and moderate RCA disease. Ejection fraction was 55%.  . Cancer of upper lobe of left lung ([place]) 11/2016   Rad tx's.  . Cardiac arrest [facility]) Nov. 2012   x 2   . Cardiac arrest - ventricular fibrillation   . HOH (hard of hearing)    Left Hearing Aid  . Hyperlipidemia   . Hypertension   . Post PTCA 2013   x2  . ST elevation MI (STEMI) ([place]) 08/26/2011    PAST SURGICAL HISTORY: Past Surgical History:  Procedure Laterality Date  . CARDIAC CATHETERIZATION  2013   @ [place]; [facility] s/p stent   . CORONARY ANGIOPLASTY    . ELECTROMAGNETIC NAVIGATION BROCHOSCOPY N/A 11/27/2016   Procedure: ELECTROMAGNETIC NAVIGATION BRONCHOSCOPY;  Surgeon: [name], MD;  Location: [facility] ORS;  Service: Cardiopulmonary;  Laterality: N/A;    FAMILY HISTORY: Family History  Problem Relation Age of Onset  . Heart attack Mother   . Heart attack Father     ADVANCED DIRECTIVES (Y/N):  N  HEALTH MAINTENANCE: Social History   Tobacco Use  . Smoking status: Former Smoker    Packs/day: 1.00    Types: Cigarettes    Quit date: 08/27/2011    Years since quitting: 8.4  . Smokeless tobacco: Never Used  Substance Use Topics  . Alcohol use: No  . Drug use: No     Colonoscopy:  PAP:  Bone density:  Lipid panel:  Allergies  Allergen Reactions  . Statins Other (See Comments)    Arthralagia    Current  Outpatient Medications  Medication Sig Dispense Refill  . amLODipine (NORVASC) 5 MG tablet Take 5 mg by mouth 2 (two) times daily as needed.     Marland Kitchen apixaban (ELIQUIS) 5 MG TABS tablet Take 1 tablet (5 mg total) by mouth 2 (two) times daily. 60 tablet 0  . labetalol (NORMODYNE) 100 MG tablet Take 100 mg by mouth as needed. Only if bp is 150/90    . lisinopril-hydrochlorothiazide (ZESTORETIC) 10-12.5 MG tablet Take 1 tablet by mouth daily.     . metoprolol succinate (TOPROL-XL) 50 MG 24 hr tablet Take 50 mg by mouth daily.    . traMADol (ULTRAM) 50 MG tablet Take 1 tablet (50 mg total) by mouth 2 (two) times daily as needed for moderate pain. 60 tablet 0   No current facility-administered medications for this visit.    OBJECTIVE: Vitals:   01/22/20 0954  BP: 138/76  Pulse: 65  Resp: 18  Temp: (!) 96.5 F (35.8 C)  SpO2: 100%     Body mass index is 20.42 kg/m.    ECOG FS:0 - Asymptomatic  General: Thin, no acute distress. Eyes: Pink conjunctiva, anicteric sclera. HEENT: Normocephalic, moist mucous membranes. Lungs: No audible wheezing or coughing. Heart: Regular rate and rhythm. Abdomen: Soft, nontender, no obvious distention. Musculoskeletal: No edema, cyanosis, or clubbing. Neuro: Alert, answering all questions appropriately. Cranial nerves grossly intact. Skin: No rashes or petechiae noted. Psych: Normal affect.  LAB RESULTS:  Lab Results  Component Value Date   NA 136 10/01/2019   K 3.9 10/01/2019   CL 100 10/01/2019   CO2 27 10/01/2019   GLUCOSE 109 (H) 10/01/2019   BUN 12 10/01/2019   CREATININE 1.10 01/06/2020   CALCIUM 8.7 (L) 10/01/2019   PROT 6.2 (L) 05/23/2019   ALBUMIN 3.0 (L) 05/23/2019   AST 17 05/23/2019   ALT 14 05/23/2019   ALKPHOS 78 05/23/2019   BILITOT 1.1 05/23/2019   GFRNONAA >60 10/01/2019   GFRAA >60 10/01/2019    Lab Results  Component Value Date   WBC 9.8 10/01/2019   NEUTROABS 13.7 (H) 05/23/2019   HGB 15.9 10/01/2019   HCT 47.2 10/01/2019   MCV 88.2 10/01/2019   PLT 216 10/01/2019     STUDIES: CT Chest W Contrast  Result Date: 01/06/2020 CLINICAL DATA:  Non-small cell lung cancer recurrence, assess treatment response, history of radiation therapy in 2018 EXAM: CT CHEST WITH CONTRAST TECHNIQUE: Multidetector CT imaging of the chest was performed during intravenous contrast administration. CONTRAST:  85mL OMNIPAQUE IOHEXOL 300 MG/ML  SOLN COMPARISON:  09/23/2019  FINDINGS: Cardiovascular: Calcified coronary artery disease similar to the prior study. Heart size is stable. Aortic caliber is stable with calcified and noncalcified plaque throughout the thoracic aorta. Central pulmonary arteries are unremarkable. Left hilar distortion due to post treatment changes. Mediastinum/Nodes: Mildly patulous appearance of the esophagus. Mild circumferential thickening. Left perihilar no axillary adenopathy. No thoracic inlet adenopathy. Volume loss without new soft tissue or mediastinal adenopathy. Lungs/Pleura: Fiducial markers in the central portion of collapsed lung in the left chest. Overall this area measuring approximately 5.2 x 3.9 cm as compared to 5.6 x 4.6 cm on the previous exam. This includes collapsed lung around an area of low attenuation, difficult to establish boundaries in the setting of post treatment changes. Pulmonary emphysema as before. Emphysema worse at the lung apices with both paraseptal and centrilobular components. No consolidation. New spiculated nodule in the right lower lobe measuring 9 by 7 mm (image 98 of series 3).  No pleural effusion. No fissural nodularity. Upper Abdomen: Adrenal glands are normal. Imaged portions of the liver, kidneys and spleen are unremarkable. No acute process related to the pancreas. 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 Musculoskeletal: No chest wall mass. No destructive bone finding. Subtle increase in sclerosis along the left superolateral corner of the T10 vertebral body with rounded appearance despite the presence of degenerative change (image 95, series 6) area of sclerosis measuring approximately 1 cm best seen on sagittal views. IMPRESSION: 1. New spiculated nodule in the right lower lobe measuring up to 9 mm is suspicious for metastatic disease or metachronous primary. 2. Subtle increase in sclerosis along the left superolateral corner of the T10 vertebral body with rounded  appearance suspicious for metastatic disease. Given above findings PET scan may be helpful. 3. Post treatment changes in the left chest similar to the prior exam. 4. 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 5. Calcified coronary artery disease. These results will be called to the ordering clinician or representative by the Radiologist Assistant, and communication documented in the PACS or [employer]. Aortic Atherosclerosis (ICD10-I70.0) and Emphysema (ICD10-J43.9). Electronically Signed   By: Zetta Bills M.D.   On: 01/06/2020 14:09   NM PET Image Restag (PS) Skull Base To Thigh  Result Date: 01/20/2020 CLINICAL DATA:  Subsequent treatment strategy for left upper lobe lung cancer. EXAM: NUCLEAR MEDICINE PET SKULL BASE TO THIGH TECHNIQUE: 7.3 mCi F-18 FDG was injected intravenously. Full-ring PET imaging was performed from the skull base to thigh after the radiotracer. CT data was obtained and used for attenuation correction and anatomic localization. Fasting blood glucose: 125 mg/dl COMPARISON:  CT chest dated 01/06/2020.  PET-CT dated 09/25/2018. FINDINGS: Mediastinal blood pool activity: SUV max 2.3 Liver activity: SUV max NA NECK: No hypermetabolic cervical lymphadenopathy. Incidental CT findings: none CHEST: 5 mm residual nodular opacity in the posterior right lower lobe (series 3/image 101), improved. No associated hypermetabolism. This appearance is likely infectious/inflammatory. Masslike opacity in the posteromedial left upper lobe (series 3/image 80), with fiducial markers and residual mild hypermetabolism, max SUV 5.4. This appearance favors radiation changes, although continued attention on follow-up is warranted. No hypermetabolic thoracic lymphadenopathy. Mild focal hypermetabolism involving the upper/mid esophagus, max SUV 4.9, without associated mass on CT, likely reactive. Incidental CT findings: Atherosclerotic calcifications of the aortic arch.  Mild three-vessel coronary atherosclerosis. ABDOMEN/PELVIS: No hypermetabolic lymphadenopathy in the abdomen/pelvis. No abnormal hypermetabolism in the liver, spleen, pancreas, or adrenal glands. Mild focal hypermetabolism in the left rectum, max SUV 3.8, new from the prior and without CT correlate, likely physiologic. Incidental CT findings: Atherosclerotic calcifications the abdominal aorta and branch vessels. Prostatomegaly. Thick-walled bladder, favoring chronic bladder obstruction. SKELETON: 10 mm sclerotic lesion in the left T10 vertebral body (series 3/image 114), max SUV 2.7, new/progressive from 2020, suspicious for osseous metastasis. Incidental CT findings: Degenerative changes of the visualized thoracolumbar spine. IMPRESSION: Masslike opacity in the medial left upper lobe favors radiation changes, although continued attention on follow-up is warranted. Residual 5 mm nodule in the posterior right lower lobe is improved, likely infectious/inflammatory. 10 mm sclerotic metastasis in the left T10 vertebral body. Additional ancillary findings as above. Electronically Signed   By: Julian Hy M.D.   On: 01/20/2020 15:00    ONCOLOGY HISTORY:  Biopsy of lung nodule on November 27, 2016 revealed adenocarcinoma, but there was no invasive component on the sample. Biopsy of mediastinal lymph node had insufficient  material. Patient ultimately declined any further biopsies or surgery and elected to proceed with XRT alone which he completed in April 2018.  PET scan on January 22, 2019 reviewed independently with increased metabolic activity prior to previous with SUV increasing from 6.5 up to 8.8.  Patient declined intervention at that time and elected to proceed with simple observation.  Repeat CT scan on April 21, 2019 reviewed independently with progressive 6.4 cm mass in the left upper lobe.  Patient declined systemic treatment, but agreed to proceed with XRT which she completed in approximately August 2020.   CT scan on September 23, 2019 reviewed independently with no obvious evidence of recurrent or progressive disease.   ASSESSMENT:  Adenocarcinoma of upper lobe of left lung   PLAN:   1.  Adenocarcinoma of upper lobe of left lung:  Follow-up CT scan on January 06, 2020 reviewed independently and reported as above with a new right lower lobe 9 mm spiculated nodule.  There is also a subtle increase in sclerosis in the T10 vertebral body.  PET scan results from 01/20/2020 reviewed independently and report as above with no obvious evidence of recurrent or progressive disease in patient's lung, but T10 lesion continues to be suspicious for malignancy.  We will get an MRI to further evaluate the T10 lesion as well as to assess his chronic upper thoracic pain.  Patient will have follow-up with radiation oncology 1 day after his MRI to discuss the results and consideration of treatment.  He will follow-up with medical oncology in 1 month.   2.  Back pain: Unclear etiology patient reports it has been present for 7 or 8 years.  PET scan results as above with no obvious etiology.  Thoracic MRI as above.  Patient was given a prescription for tramadol today for symptomatic relief, but has also been instructed that any refills should be given by his primary care physician.  I spent a total of 30 minutes reviewing chart data, face-to-face evaluation with the patient, counseling and coordination of care as detailed above.   Patient expressed understanding and was in agreement with this plan. He also understands that He can call clinic at any time with any questions, concerns, or complaints.     [name], MD   01/22/2020 10:55 AM

## 2020-01-20 ENCOUNTER — Other Ambulatory Visit: Payer: Self-pay

## 2020-01-20 ENCOUNTER — Ambulatory Visit
Admission: RE | Admit: 2020-01-20 | Discharge: 2020-01-20 | Disposition: A | Discharge status: Home / Self Care | Payer: Medicare HMO | Source: Ambulatory Visit | Attending: Oncology | Admitting: Oncology

## 2020-01-20 DIAGNOSIS — C3412 Malignant neoplasm of upper lobe, left bronchus or lung: Secondary | ICD-10-CM | POA: Diagnosis not present

## 2020-01-20 DIAGNOSIS — C7951 Secondary malignant neoplasm of bone: Secondary | ICD-10-CM | POA: Diagnosis not present

## 2020-01-20 LAB — GLUCOSE, CAPILLARY: Glucose-Capillary: 125 mg/dL — ABNORMAL HIGH (ref 70–99)

## 2020-01-20 MED ORDER — FLUDEOXYGLUCOSE F - 18 (FDG) INJECTION
6.9000 | Freq: Once | INTRAVENOUS | Status: AC | PRN
  Administered 2020-01-20: 7.25 via INTRAVENOUS

## 2020-01-22 ENCOUNTER — Inpatient Hospital Stay: Payer: Medicare HMO | Attending: Oncology | Admitting: Oncology

## 2020-01-22 ENCOUNTER — Encounter: Payer: Self-pay | Admitting: Oncology

## 2020-01-22 ENCOUNTER — Other Ambulatory Visit: Payer: Self-pay

## 2020-01-22 VITALS — BP 138/76 | HR 65 | Temp 96.5°F | Resp 18 | Wt 134.3 lb

## 2020-01-22 DIAGNOSIS — I2510 Atherosclerotic heart disease of native coronary artery without angina pectoris: Secondary | ICD-10-CM | POA: Insufficient documentation

## 2020-01-22 DIAGNOSIS — Z7901 Long term (current) use of anticoagulants: Secondary | ICD-10-CM | POA: Diagnosis not present

## 2020-01-22 DIAGNOSIS — C3412 Malignant neoplasm of upper lobe, left bronchus or lung: Secondary | ICD-10-CM | POA: Insufficient documentation

## 2020-01-22 DIAGNOSIS — C7951 Secondary malignant neoplasm of bone: Secondary | ICD-10-CM | POA: Diagnosis not present

## 2020-01-22 DIAGNOSIS — Z87891 Personal history of nicotine dependence: Secondary | ICD-10-CM | POA: Insufficient documentation

## 2020-01-22 DIAGNOSIS — I10 Essential (primary) hypertension: Secondary | ICD-10-CM | POA: Insufficient documentation

## 2020-01-22 DIAGNOSIS — I252 Old myocardial infarction: Secondary | ICD-10-CM | POA: Insufficient documentation

## 2020-01-22 DIAGNOSIS — R531 Weakness: Secondary | ICD-10-CM | POA: Diagnosis not present

## 2020-01-22 DIAGNOSIS — Z79899 Other long term (current) drug therapy: Secondary | ICD-10-CM | POA: Diagnosis not present

## 2020-01-22 MED ORDER — TRAMADOL HCL 50 MG PO TABS: 50.0000 mg | ORAL_TABLET | Freq: Two times a day (BID) | ORAL | 0 refills | Status: DC | PRN

## 2020-01-22 NOTE — Progress Notes (Signed)
Patient states he is constantly having shoulder pain and he rates the pain at a 4. Daughter would like an alert placed in patient's chart to know he is very hard of hearing

## 2020-01-25 ENCOUNTER — Other Ambulatory Visit: Payer: Self-pay | Admitting: *Deleted

## 2020-01-25 MED ORDER — TRAMADOL HCL 50 MG PO TABS: 50.0000 mg | ORAL_TABLET | Freq: Two times a day (BID) | ORAL | 0 refills | Status: DC | PRN

## 2020-01-25 NOTE — Telephone Encounter (Signed)
Fridays prescription printed and did not go to pharmacy

## 2020-02-02 ENCOUNTER — Other Ambulatory Visit: Payer: Self-pay | Admitting: Emergency Medicine

## 2020-02-02 DIAGNOSIS — C3412 Malignant neoplasm of upper lobe, left bronchus or lung: Secondary | ICD-10-CM

## 2020-02-04 ENCOUNTER — Other Ambulatory Visit: Payer: Self-pay

## 2020-02-04 ENCOUNTER — Encounter: Payer: Self-pay | Admitting: Radiation Oncology

## 2020-02-04 ENCOUNTER — Ambulatory Visit
Admission: RE | Admit: 2020-02-04 | Discharge: 2020-02-04 | Disposition: A | Discharge status: Home / Self Care | Payer: Medicare HMO | Source: Ambulatory Visit | Attending: Oncology | Admitting: Oncology

## 2020-02-04 DIAGNOSIS — C3412 Malignant neoplasm of upper lobe, left bronchus or lung: Secondary | ICD-10-CM

## 2020-02-04 DIAGNOSIS — Z85118 Personal history of other malignant neoplasm of bronchus and lung: Secondary | ICD-10-CM | POA: Diagnosis not present

## 2020-02-04 MED ORDER — GADOBUTROL 1 MMOL/ML IV SOLN
6.0000 mL | Freq: Once | INTRAVENOUS | Status: AC | PRN
  Administered 2020-02-04: 6 mL via INTRAVENOUS

## 2020-02-05 ENCOUNTER — Ambulatory Visit
Admission: RE | Admit: 2020-02-05 | Discharge: 2020-02-05 | Disposition: A | Discharge status: Home / Self Care | Payer: Medicare HMO | Source: Ambulatory Visit | Attending: Radiation Oncology | Admitting: Radiation Oncology

## 2020-02-05 VITALS — BP 102/67 | HR 69 | Temp 97.0°F | Wt 132.8 lb

## 2020-02-05 DIAGNOSIS — C3412 Malignant neoplasm of upper lobe, left bronchus or lung: Secondary | ICD-10-CM | POA: Diagnosis not present

## 2020-02-05 DIAGNOSIS — C7951 Secondary malignant neoplasm of bone: Secondary | ICD-10-CM | POA: Diagnosis not present

## 2020-02-05 DIAGNOSIS — Z87891 Personal history of nicotine dependence: Secondary | ICD-10-CM | POA: Diagnosis not present

## 2020-02-09 ENCOUNTER — Encounter: Payer: Self-pay | Admitting: Radiation Oncology

## 2020-02-11 ENCOUNTER — Other Ambulatory Visit: Payer: Self-pay

## 2020-02-12 ENCOUNTER — Ambulatory Visit
Admission: RE | Admit: 2020-02-12 | Discharge: 2020-02-12 | Disposition: A | Discharge status: Home / Self Care | Payer: Medicare HMO | Source: Ambulatory Visit | Attending: Radiation Oncology | Admitting: Radiation Oncology

## 2020-02-12 DIAGNOSIS — C3412 Malignant neoplasm of upper lobe, left bronchus or lung: Secondary | ICD-10-CM | POA: Insufficient documentation

## 2020-02-12 DIAGNOSIS — C7951 Secondary malignant neoplasm of bone: Secondary | ICD-10-CM | POA: Diagnosis not present

## 2020-02-13 DIAGNOSIS — C3412 Malignant neoplasm of upper lobe, left bronchus or lung: Secondary | ICD-10-CM | POA: Insufficient documentation

## 2020-02-15 ENCOUNTER — Other Ambulatory Visit: Payer: Self-pay | Admitting: *Deleted

## 2020-02-15 DIAGNOSIS — C3412 Malignant neoplasm of upper lobe, left bronchus or lung: Secondary | ICD-10-CM

## 2020-02-16 DIAGNOSIS — C7951 Secondary malignant neoplasm of bone: Secondary | ICD-10-CM | POA: Diagnosis not present

## 2020-02-16 DIAGNOSIS — C3412 Malignant neoplasm of upper lobe, left bronchus or lung: Secondary | ICD-10-CM | POA: Diagnosis not present

## 2020-02-17 ENCOUNTER — Ambulatory Visit: Admission: RE | Admit: 2020-02-17 | Payer: Medicare HMO | Source: Ambulatory Visit

## 2020-02-17 DIAGNOSIS — C7951 Secondary malignant neoplasm of bone: Secondary | ICD-10-CM | POA: Diagnosis not present

## 2020-02-17 DIAGNOSIS — C3412 Malignant neoplasm of upper lobe, left bronchus or lung: Secondary | ICD-10-CM | POA: Diagnosis not present

## 2020-02-18 ENCOUNTER — Ambulatory Visit
Admission: RE | Admit: 2020-02-18 | Discharge: 2020-02-18 | Disposition: A | Discharge status: Home / Self Care | Payer: Medicare HMO | Source: Ambulatory Visit | Attending: Radiation Oncology | Admitting: Radiation Oncology

## 2020-02-18 DIAGNOSIS — C3412 Malignant neoplasm of upper lobe, left bronchus or lung: Secondary | ICD-10-CM | POA: Diagnosis not present

## 2020-02-18 DIAGNOSIS — C7951 Secondary malignant neoplasm of bone: Secondary | ICD-10-CM | POA: Diagnosis not present

## 2020-02-18 NOTE — Progress Notes (Signed)
Radiation Oncology Follow up Note  Name: Scott Gross   Date:   02/05/2020 MRN:  [id number] DOB: 12/11/1933    This 86 y.o. male presents to the clinic today for 23-month follow-up status post salvage radiation therapy to his left lung area previously treated with SBRT for stage I disease adenocarcinoma approximately 2-1/2 years prior.  REFERRING PROVIDER: [name], MD  HPI: Patient is an 86 year old male now at 8 months having completed SBRT in salvage mode in an area previously treated for stage I adenocarcinoma approximately 2 and half years prior.  He is seen today in routine follow-up is doing fairly well specifically denies cough hemoptysis or chest tightness..  Patient has been complaining of chronic upper back pain.  CT scan back in March showing a new right lower lobe lung nodule measuring 9 mm suspicious for metastatic disease or metachronous primary.  Also had subtle increase in sclerosis of the T10 vertebral body.  PET CTs was performed showing masslike opacity medial left upper lobe favors radiation changes.  The residual 5 mm nodule in the posterior right lower lobe was improved likely infectious in nature.  The 10 mm sclerotic lesion in the T10 vertebral body was consistent with metastasis.  MRI scan of his thoracic spine showing findings consistent with small metastatic deposit in the superior aspect of T10 vertebral body.  No other metastatic disease was noted.  He is seen today for consideration of palliative radiation therapy to his T10 vertebral body.  COMPLICATIONS OF TREATMENT: none  FOLLOW UP COMPLIANCE: keeps appointments   PHYSICAL EXAM:  BP 102/67   Pulse 69   Temp (!) 97 F (36.1 C) (Tympanic)   Wt 132 lb 12.8 oz (60.2 kg)   BMI 20.19 kg/m  No focal neurologic deficits are noted.  Well-developed well-nourished patient in NAD. HEENT reveals PERLA, EOMI, discs not visualized.  Oral cavity is clear. No oral mucosal lesions are identified. Neck is clear without  evidence of cervical or supraclavicular adenopathy. Lungs are clear to A&P. Cardiac examination is essentially unremarkable with regular rate and rhythm without murmur rub or thrill. Abdomen is benign with no organomegaly or masses noted. Motor sensory and DTR levels are equal and symmetric in the upper and lower extremities. Cranial nerves II through XII are grossly intact. Proprioception is intact. No peripheral adenopathy or edema is identified. No motor or sensory levels are noted. Crude visual fields are within normal range.  RADIOLOGY RESULTS: CT scans PET CT scans and MRI scans all reviewed compatible with above-stated findings  PLAN: This time elected ahead with palliative radiation therapy to his T10 vertebral body.  We will plan to delivering 3000 cGy in 10 fractions.  Risks and benefits of treatment including skin reaction fatigue possible slight chance of diarrhea alteration of blood counts all were discussed in detail with the patient and his wife.  They both seem to comprehend my treatment plan well.  I have personally set up and ordered CT simulation.  I would like to take this opportunity to thank you for allowing me to participate in the care of your patient.Noreene Filbert, MD

## 2020-02-19 ENCOUNTER — Ambulatory Visit
Admission: RE | Admit: 2020-02-19 | Discharge: 2020-02-19 | Disposition: A | Discharge status: Home / Self Care | Payer: Medicare HMO | Source: Ambulatory Visit | Attending: Radiation Oncology | Admitting: Radiation Oncology

## 2020-02-19 DIAGNOSIS — C3412 Malignant neoplasm of upper lobe, left bronchus or lung: Secondary | ICD-10-CM | POA: Diagnosis not present

## 2020-02-19 NOTE — Progress Notes (Signed)
[place]  [phone]) [phone] Fax:(336) [phone]  ID: Scott Gross OB: 11/02/1933  MR#: [id number]  OVF#:[id number]  Patient Care Team: [name], MD as PCP - General (Family Medicine) [name], MD as Consulting Physician (Oncology)   CHIEF COMPLAINT: Adenocarcinoma of upper lobe of left lung.  INTERVAL HISTORY: Patient returns to clinic today for further evaluation.  He has now initiated XRT to the known metastatic lesion in his T10 vertebra.  He reports no significant change in his back pain.  He continues to complain of bilateral shoulder pain that has been present for decades.  He has chronic weakness and fatigue.   He has a fair appetite and denies weight loss.  He has no neurologic complaints. He denies any chest pain, cough, hemoptysis, or shortness of breath. He has no nausea, vomiting, constipation, or diarrhea. He has no urinary complaints.  Patient offers no further specific complaints today.  REVIEW OF SYSTEMS:   Review of Systems  Constitutional: Positive for malaise/fatigue. Negative for fever and weight loss.  Respiratory: Negative.  Negative for cough, hemoptysis and shortness of breath.   Cardiovascular: Negative.  Negative for chest pain and leg swelling.  Gastrointestinal: Negative.  Negative for abdominal pain.  Genitourinary: Negative.  Negative for dysuria.  Musculoskeletal: Positive for back pain and joint pain.  Skin: Negative.  Negative for rash.  Neurological: Positive for weakness. Negative for sensory change, focal weakness and headaches.  Psychiatric/Behavioral: Negative.  The patient is not nervous/anxious.     As per HPI. Otherwise, a complete review of systems is negative.  PAST MEDICAL HISTORY: Past Medical History:  Diagnosis Date  . Arthritis   . CAD (coronary artery disease) Nov. 12, 2012   Inferior ST elevation MI in November of 2012 with ventricular fibrillation arrest. Status post drug-eluting stent  placement to the mid LAD. Most recent cardiac catheterization in June of this year showed patent stent with minimal restenosis, 75% proximal LAD and 80% distal LAD which were unchanged from before. Mild disease in the left circumflex and moderate RCA disease. Ejection fraction was 55%.  . Cancer of upper lobe of left lung ([place]) 11/2016   Rad tx's.  . Cardiac arrest [facility]) Nov. 2012   x 2   . Cardiac arrest - ventricular fibrillation   . HOH (hard of hearing)    Left Hearing Aid  . Hyperlipidemia   . Hypertension   . Post PTCA 2013   x2  . ST elevation MI (STEMI) ([place]) 08/26/2011    PAST SURGICAL HISTORY: Past Surgical History:  Procedure Laterality Date  . CARDIAC CATHETERIZATION  2013   @ [place]; [facility] s/p stent   . CORONARY ANGIOPLASTY    . ELECTROMAGNETIC NAVIGATION BROCHOSCOPY N/A 11/27/2016   Procedure: ELECTROMAGNETIC NAVIGATION BRONCHOSCOPY;  Surgeon: [name], MD;  Location: [facility] ORS;  Service: Cardiopulmonary;  Laterality: N/A;    FAMILY HISTORY: Family History  Problem Relation Age of Onset  . Heart attack Mother   . Heart attack Father     ADVANCED DIRECTIVES (Y/N):  N  HEALTH MAINTENANCE: Social History   Tobacco Use  . Smoking status: Former Smoker    Packs/day: 1.00    Types: Cigarettes    Quit date: 08/27/2011    Years since quitting: 8.5  . Smokeless tobacco: Never Used  Substance Use Topics  . Alcohol use: No  . Drug use: No     Colonoscopy:  PAP:  Bone density:  Lipid panel:  Allergies  Allergen  Reactions  . Statins Other (See Comments)    Arthralagia    Current Outpatient Medications  Medication Sig Dispense Refill  . amLODipine (NORVASC) 5 MG tablet Take 5 mg by mouth 2 (two) times daily as needed.     Marland Kitchen apixaban (ELIQUIS) 5 MG TABS tablet Take 1 tablet (5 mg total) by mouth 2 (two) times daily. 60 tablet 0  . labetalol (NORMODYNE) 100 MG tablet Take 100 mg by mouth as needed. Only if bp is 150/90    .  lisinopril-hydrochlorothiazide (ZESTORETIC) 10-12.5 MG tablet Take 1 tablet by mouth daily.    . metoprolol succinate (TOPROL-XL) 50 MG 24 hr tablet Take 50 mg by mouth daily.    . traMADol (ULTRAM) 50 MG tablet Take 1 tablet (50 mg total) by mouth 2 (two) times daily as needed for moderate pain. 60 tablet 0   No current facility-administered medications for this visit.    OBJECTIVE: Vitals:   02/26/20 0901  BP: 118/70  Pulse: 72  Resp: 20  Temp: (!) 96.7 F (35.9 C)  SpO2: 100%     Body mass index is 20.19 kg/m.    ECOG FS:0 - Asymptomatic  General: Thin, no acute distress. Eyes: Pink conjunctiva, anicteric sclera. HEENT: Normocephalic, moist mucous membranes. Lungs: No audible wheezing or coughing. Heart: Regular rate and rhythm. Abdomen: Soft, nontender, no obvious distention. Musculoskeletal: No edema, cyanosis, or clubbing. Neuro: Alert, answering all questions appropriately. Cranial nerves grossly intact. Skin: No rashes or petechiae noted. Psych: Normal affect.   LAB RESULTS:  Lab Results  Component Value Date   NA 137 02/24/2020   K 4.2 02/24/2020   CL 96 (L) 02/24/2020   CO2 31 02/24/2020   GLUCOSE 188 (H) 02/24/2020   BUN 11 02/24/2020   CREATININE 1.13 02/24/2020   CALCIUM 9.0 02/24/2020   PROT 7.2 02/24/2020   ALBUMIN 3.8 02/24/2020   AST 17 02/24/2020   ALT 13 02/24/2020   ALKPHOS 73 02/24/2020   BILITOT 1.0 02/24/2020   GFRNONAA 60 (L) 02/24/2020   GFRAA >60 02/24/2020    Lab Results  Component Value Date   WBC 10.1 02/24/2020   NEUTROABS 7.1 02/24/2020   HGB 15.8 02/24/2020   HCT 44.9 02/24/2020   MCV 89.8 02/24/2020   PLT 248 02/24/2020     STUDIES: MR Thoracic Spine W Wo Contrast  Result Date: 02/05/2020 CLINICAL DATA:  History of lung carcinoma. Lesion in the T10 vertebral body on PET CT scan 01/20/2020. EXAM: MRI THORACIC WITHOUT AND WITH CONTRAST TECHNIQUE: Multiplanar and multiecho pulse sequences of the thoracic spine were  obtained without and with intravenous contrast. CONTRAST:  6 mL GADAVIST IV SOLN COMPARISON:  PET CT scan 01/20/2020 and 09/25/2018. CT chest 12/20/2018 and 01/06/2020. FINDINGS: MRI THORACIC SPINE FINDINGS Alignment:  Normal. Vertebrae: No fracture. A T1 and T2 hypointense lesion in the superior endplate of I29 measures 0.7 cm craniocaudal by 1.3 cm AP x 0.8 cm transverse and is consistent with a metastatic deposit. Scattered small Schmorl's nodes are identified including in the inferior endplate of N98. Increased T1 and T2 signal in the upper thoracic vertebral bodies is consistent with prior radiation therapy. Cord:  Normal signal throughout.  No epidural tumor is identified. Paraspinal and other soft tissues: Please see report of recent chest CT. Disc levels: The central spinal canal and neural foramina are widely patent throughout with mild degenerative disc disease most notable at T5-6 where there is a very shallow central protrusion. IMPRESSION: Findings consistent with  a small metastatic deposit in the superior aspect of the T10 vertebral body. No other metastatic disease identified. Negative for epidural tumor, fracture or central canal narrowing. Electronically Signed   By: Inge Rise M.D.   On: 02/05/2020 09:59    ONCOLOGY HISTORY:  Biopsy of lung nodule on November 27, 2016 revealed adenocarcinoma, but there was no invasive component on the sample. Biopsy of mediastinal lymph node had insufficient material. Patient ultimately declined any further biopsies or surgery and elected to proceed with XRT alone which he completed in April 2018.  PET scan on January 22, 2019 reviewed independently with increased metabolic activity prior to previous with SUV increasing from 6.5 up to 8.8.  Patient declined intervention at that time and elected to proceed with simple observation.  Repeat CT scan on April 21, 2019 reviewed independently with progressive 6.4 cm mass in the left upper lobe.  Patient declined  systemic treatment, but agreed to proceed with XRT which she completed in approximately August 2020.  CT scan on September 23, 2019 reviewed independently with no obvious evidence of recurrent or progressive disease.   ASSESSMENT:  Adenocarcinoma of upper lobe of left lung   PLAN:   1.  Adenocarcinoma of upper lobe of left lung:  Follow-up CT scan on January 06, 2020 reviewed independently with a new right lower lobe 9 mm spiculated nodule.  There is also a subtle increase in sclerosis in the T10 vertebral body.  PET scan results from 01/20/2020 reviewed independently with no obvious evidence of recurrent or progressive disease in patient's lung, but T10 lesion continues to be suspicious for malignancy.  Patient underwent MRI and then subsequently started XRT to the T10 lesion.  He will complete treatment on Mar 02, 2020.  No further intervention is needed at this time.  Return to clinic in 3 months with repeat imaging using PET scan and further evaluation.  2.  Shoulder/back pain: Patient reports this has been evident for years/decades: Continue tramadol and XRT as above.     Patient expressed understanding and was in agreement with this plan. He also understands that He can call clinic at any time with any questions, concerns, or complaints.     [name], MD   02/26/2020 3:59 PM

## 2020-02-22 ENCOUNTER — Ambulatory Visit
Admission: RE | Admit: 2020-02-22 | Discharge: 2020-02-22 | Disposition: A | Discharge status: Home / Self Care | Payer: Medicare HMO | Source: Ambulatory Visit | Attending: Radiation Oncology | Admitting: Radiation Oncology

## 2020-02-22 ENCOUNTER — Encounter: Payer: Self-pay | Admitting: Oncology

## 2020-02-22 DIAGNOSIS — C3412 Malignant neoplasm of upper lobe, left bronchus or lung: Secondary | ICD-10-CM | POA: Diagnosis not present

## 2020-02-23 ENCOUNTER — Telehealth: Payer: Self-pay | Admitting: *Deleted

## 2020-02-23 ENCOUNTER — Inpatient Hospital Stay: Payer: Medicare HMO | Admitting: Oncology

## 2020-02-23 ENCOUNTER — Other Ambulatory Visit: Payer: Self-pay | Admitting: *Deleted

## 2020-02-23 ENCOUNTER — Inpatient Hospital Stay: Payer: Medicare HMO

## 2020-02-23 ENCOUNTER — Ambulatory Visit
Admission: RE | Admit: 2020-02-23 | Discharge: 2020-02-23 | Disposition: A | Discharge status: Home / Self Care | Payer: Medicare HMO | Source: Ambulatory Visit | Attending: Radiation Oncology | Admitting: Radiation Oncology

## 2020-02-23 DIAGNOSIS — C3412 Malignant neoplasm of upper lobe, left bronchus or lung: Secondary | ICD-10-CM | POA: Diagnosis not present

## 2020-02-23 NOTE — Telephone Encounter (Signed)
Called patient per Rulon Abide, NP request to discuss shoulder pain and decreased appetite. Offered patient an appointment to see Sonia Baller in the symptom management clinic tomorrow after radiation. Patient agreed to appointment. Appointment made for 9:15 with Rulon Abide, NP.

## 2020-02-23 NOTE — Telephone Encounter (Signed)
We will get him scheduled. I sent Doni a message to get him in to clinic.  Faythe Casa, NP 02/23/2020 11:47 AM

## 2020-02-24 ENCOUNTER — Ambulatory Visit
Admission: RE | Admit: 2020-02-24 | Discharge: 2020-02-24 | Disposition: A | Discharge status: Home / Self Care | Payer: Medicare HMO | Source: Ambulatory Visit | Attending: Radiation Oncology | Admitting: Radiation Oncology

## 2020-02-24 ENCOUNTER — Inpatient Hospital Stay: Payer: Medicare HMO | Attending: Oncology

## 2020-02-24 ENCOUNTER — Other Ambulatory Visit: Payer: Self-pay

## 2020-02-24 DIAGNOSIS — R5383 Other fatigue: Secondary | ICD-10-CM | POA: Insufficient documentation

## 2020-02-24 DIAGNOSIS — M25511 Pain in right shoulder: Secondary | ICD-10-CM | POA: Diagnosis not present

## 2020-02-24 DIAGNOSIS — Z87891 Personal history of nicotine dependence: Secondary | ICD-10-CM | POA: Insufficient documentation

## 2020-02-24 DIAGNOSIS — C3412 Malignant neoplasm of upper lobe, left bronchus or lung: Secondary | ICD-10-CM | POA: Diagnosis not present

## 2020-02-24 DIAGNOSIS — C7951 Secondary malignant neoplasm of bone: Secondary | ICD-10-CM | POA: Insufficient documentation

## 2020-02-24 DIAGNOSIS — M25512 Pain in left shoulder: Secondary | ICD-10-CM | POA: Diagnosis not present

## 2020-02-24 DIAGNOSIS — M549 Dorsalgia, unspecified: Secondary | ICD-10-CM | POA: Diagnosis not present

## 2020-02-24 DIAGNOSIS — R531 Weakness: Secondary | ICD-10-CM | POA: Diagnosis not present

## 2020-02-24 LAB — COMPREHENSIVE METABOLIC PANEL
ALT: 13 U/L (ref 0–44)
AST: 17 U/L (ref 15–41)
Albumin: 3.8 g/dL (ref 3.5–5.0)
Alkaline Phosphatase: 73 U/L (ref 38–126)
Anion gap: 10 (ref 5–15)
BUN: 11 mg/dL (ref 8–23)
CO2: 31 mmol/L (ref 22–32)
Calcium: 9 mg/dL (ref 8.9–10.3)
Chloride: 96 mmol/L — ABNORMAL LOW (ref 98–111)
Creatinine, Ser: 1.13 mg/dL (ref 0.61–1.24)
GFR calc Af Amer: >60 mL/min (ref >60)
GFR calc non Af Amer: 60 mL/min — ABNORMAL LOW (ref >60)
Glucose, Bld: 188 mg/dL — ABNORMAL HIGH (ref 70–99)
Potassium: 4.2 mmol/L (ref 3.5–5.1)
Sodium: 137 mmol/L (ref 135–145)
Total Bilirubin: 1 mg/dL (ref 0.3–1.2)
Total Protein: 7.2 g/dL (ref 6.5–8.1)

## 2020-02-24 LAB — CBC WITH DIFFERENTIAL/PLATELET
Abs Immature Granulocytes: 0.07 10*3/uL (ref 0.00–0.07)
Basophils Absolute: 0.1 10*3/uL (ref 0.0–0.1)
Basophils Relative: 1 %
Eosinophils Absolute: 0.2 10*3/uL (ref 0.0–0.5)
Eosinophils Relative: 2 %
HCT: 44.9 % (ref 39.0–52.0)
Hemoglobin: 15.8 g/dL (ref 13.0–17.0)
Immature Granulocytes: 1 %
Lymphocytes Relative: 19 %
Lymphs Abs: 2 10*3/uL (ref 0.7–4.0)
MCH: 31.6 pg (ref 26.0–34.0)
MCHC: 35.2 g/dL (ref 30.0–36.0)
MCV: 89.8 fL (ref 80.0–100.0)
Monocytes Absolute: 0.8 10*3/uL (ref 0.1–1.0)
Monocytes Relative: 8 %
Neutro Abs: 7.1 10*3/uL (ref 1.7–7.7)
Neutrophils Relative %: 69 %
Platelets: 248 10*3/uL (ref 150–400)
RBC: 5 MIL/uL (ref 4.22–5.81)
RDW: 12 % (ref 11.5–15.5)
WBC: 10.1 10*3/uL (ref 4.0–10.5)
nRBC: 0 % (ref 0.0–0.2)

## 2020-02-24 LAB — MAGNESIUM: Magnesium: 2.2 mg/dL (ref 1.7–2.4)

## 2020-02-25 ENCOUNTER — Ambulatory Visit
Admission: RE | Admit: 2020-02-25 | Discharge: 2020-02-25 | Disposition: A | Discharge status: Home / Self Care | Payer: Medicare HMO | Source: Ambulatory Visit | Attending: Radiation Oncology | Admitting: Radiation Oncology

## 2020-02-25 ENCOUNTER — Encounter: Payer: Self-pay | Admitting: Oncology

## 2020-02-25 DIAGNOSIS — R5383 Other fatigue: Secondary | ICD-10-CM | POA: Diagnosis not present

## 2020-02-25 DIAGNOSIS — M25512 Pain in left shoulder: Secondary | ICD-10-CM | POA: Diagnosis not present

## 2020-02-25 DIAGNOSIS — C3412 Malignant neoplasm of upper lobe, left bronchus or lung: Secondary | ICD-10-CM | POA: Diagnosis not present

## 2020-02-25 DIAGNOSIS — Z87891 Personal history of nicotine dependence: Secondary | ICD-10-CM | POA: Diagnosis not present

## 2020-02-25 DIAGNOSIS — M549 Dorsalgia, unspecified: Secondary | ICD-10-CM | POA: Diagnosis not present

## 2020-02-25 DIAGNOSIS — R531 Weakness: Secondary | ICD-10-CM | POA: Diagnosis not present

## 2020-02-25 DIAGNOSIS — M25511 Pain in right shoulder: Secondary | ICD-10-CM | POA: Diagnosis not present

## 2020-02-25 DIAGNOSIS — C7951 Secondary malignant neoplasm of bone: Secondary | ICD-10-CM | POA: Diagnosis not present

## 2020-02-26 ENCOUNTER — Inpatient Hospital Stay: Payer: Medicare HMO | Admitting: Oncology

## 2020-02-26 ENCOUNTER — Other Ambulatory Visit: Payer: Self-pay

## 2020-02-26 ENCOUNTER — Ambulatory Visit
Admission: RE | Admit: 2020-02-26 | Discharge: 2020-02-26 | Disposition: A | Discharge status: Home / Self Care | Payer: Medicare HMO | Source: Ambulatory Visit | Attending: Radiation Oncology | Admitting: Radiation Oncology

## 2020-02-26 VITALS — BP 118/70 | HR 72 | Temp 96.7°F | Resp 20 | Wt 132.8 lb

## 2020-02-26 DIAGNOSIS — R531 Weakness: Secondary | ICD-10-CM | POA: Diagnosis not present

## 2020-02-26 DIAGNOSIS — M549 Dorsalgia, unspecified: Secondary | ICD-10-CM | POA: Diagnosis not present

## 2020-02-26 DIAGNOSIS — C7951 Secondary malignant neoplasm of bone: Secondary | ICD-10-CM | POA: Diagnosis not present

## 2020-02-26 DIAGNOSIS — M25512 Pain in left shoulder: Secondary | ICD-10-CM | POA: Diagnosis not present

## 2020-02-26 DIAGNOSIS — Z87891 Personal history of nicotine dependence: Secondary | ICD-10-CM | POA: Diagnosis not present

## 2020-02-26 DIAGNOSIS — M25511 Pain in right shoulder: Secondary | ICD-10-CM | POA: Diagnosis not present

## 2020-02-26 DIAGNOSIS — C3412 Malignant neoplasm of upper lobe, left bronchus or lung: Secondary | ICD-10-CM

## 2020-02-26 DIAGNOSIS — R5383 Other fatigue: Secondary | ICD-10-CM | POA: Diagnosis not present

## 2020-02-29 ENCOUNTER — Ambulatory Visit
Admission: RE | Admit: 2020-02-29 | Discharge: 2020-02-29 | Disposition: A | Discharge status: Home / Self Care | Payer: Medicare HMO | Source: Ambulatory Visit | Attending: Radiation Oncology | Admitting: Radiation Oncology

## 2020-02-29 DIAGNOSIS — C3412 Malignant neoplasm of upper lobe, left bronchus or lung: Secondary | ICD-10-CM | POA: Diagnosis not present

## 2020-03-01 ENCOUNTER — Ambulatory Visit
Admission: RE | Admit: 2020-03-01 | Discharge: 2020-03-01 | Disposition: A | Discharge status: Home / Self Care | Payer: Medicare HMO | Source: Ambulatory Visit | Attending: Radiation Oncology | Admitting: Radiation Oncology

## 2020-03-01 DIAGNOSIS — C3412 Malignant neoplasm of upper lobe, left bronchus or lung: Secondary | ICD-10-CM | POA: Diagnosis not present

## 2020-03-02 ENCOUNTER — Ambulatory Visit
Admission: RE | Admit: 2020-03-02 | Discharge: 2020-03-02 | Disposition: A | Discharge status: Home / Self Care | Payer: Medicare HMO | Source: Ambulatory Visit | Attending: Radiation Oncology | Admitting: Radiation Oncology

## 2020-03-02 DIAGNOSIS — C3412 Malignant neoplasm of upper lobe, left bronchus or lung: Secondary | ICD-10-CM | POA: Diagnosis not present

## 2020-03-09 ENCOUNTER — Telehealth: Payer: Self-pay | Admitting: *Deleted

## 2020-03-09 NOTE — Telephone Encounter (Signed)
Called patient to offer an appointment in the symptom management clinic for evaluation and treatment for weakness and fatigue. Patient declined appointment at this time, but said he would call back if he starts to feel worse and not better.

## 2020-03-09 NOTE — Telephone Encounter (Signed)
Wife called reporting that patient has completed radiation therapy and that he is very weak and has no energy. She is asking what to do for him. Please advise

## 2020-03-09 NOTE — Telephone Encounter (Signed)
Thanks so much!  Faythe Casa, NP 03/09/2020 2:38 PM

## 2020-03-09 NOTE — Telephone Encounter (Signed)
I can see him if she would like him to come in and have labs.   Faythe Casa, NP 03/09/2020 12:01 PM

## 2020-03-10 ENCOUNTER — Inpatient Hospital Stay: Payer: Medicare HMO

## 2020-03-10 ENCOUNTER — Ambulatory Visit: Payer: Medicare HMO

## 2020-03-10 ENCOUNTER — Ambulatory Visit (HOSPITAL_BASED_OUTPATIENT_CLINIC_OR_DEPARTMENT_OTHER): Payer: Medicare HMO | Admitting: Oncology

## 2020-03-10 VITALS — BP 98/70 | HR 80 | Temp 97.4°F | Resp 18

## 2020-03-10 DIAGNOSIS — C3412 Malignant neoplasm of upper lobe, left bronchus or lung: Secondary | ICD-10-CM

## 2020-03-10 DIAGNOSIS — M25512 Pain in left shoulder: Secondary | ICD-10-CM | POA: Diagnosis not present

## 2020-03-10 DIAGNOSIS — R531 Weakness: Secondary | ICD-10-CM

## 2020-03-10 DIAGNOSIS — Z87891 Personal history of nicotine dependence: Secondary | ICD-10-CM | POA: Diagnosis not present

## 2020-03-10 DIAGNOSIS — R5383 Other fatigue: Secondary | ICD-10-CM | POA: Diagnosis not present

## 2020-03-10 DIAGNOSIS — M549 Dorsalgia, unspecified: Secondary | ICD-10-CM | POA: Diagnosis not present

## 2020-03-10 DIAGNOSIS — I951 Orthostatic hypotension: Secondary | ICD-10-CM

## 2020-03-10 DIAGNOSIS — C7951 Secondary malignant neoplasm of bone: Secondary | ICD-10-CM | POA: Diagnosis not present

## 2020-03-10 DIAGNOSIS — M25511 Pain in right shoulder: Secondary | ICD-10-CM | POA: Diagnosis not present

## 2020-03-10 LAB — CBC WITH DIFFERENTIAL/PLATELET
Eosinophils Absolute: 0.1 10*3/uL (ref 0.0–0.5)
Eosinophils Relative: 1 %
HCT: 44.1 % (ref 39.0–52.0)
Hemoglobin: 15.5 g/dL (ref 13.0–17.0)
Lymphocytes Relative: 12 %
Lymphs Abs: 1.2 10*3/uL (ref 0.7–4.0)
MCH: 31.1 pg (ref 26.0–34.0)
MCHC: 35.1 g/dL (ref 30.0–36.0)
MCV: 88.6 fL (ref 80.0–100.0)
Monocytes Absolute: 0.9 10*3/uL (ref 0.1–1.0)
Monocytes Relative: 9 %
Neutro Abs: 7.9 10*3/uL — ABNORMAL HIGH (ref 1.7–7.7)
Neutrophils Relative %: 78 %
Platelets: 230 10*3/uL (ref 150–400)
RBC: 4.98 MIL/uL (ref 4.22–5.81)
RDW: 12 % (ref 11.5–15.5)
WBC: 10.1 10*3/uL (ref 4.0–10.5)

## 2020-03-10 LAB — COMPREHENSIVE METABOLIC PANEL
ALT: 12 U/L (ref 0–44)
AST: 15 U/L (ref 15–41)
Albumin: 3.6 g/dL (ref 3.5–5.0)
Alkaline Phosphatase: 62 U/L (ref 38–126)
Anion gap: 9 (ref 5–15)
BUN: 16 mg/dL (ref 8–23)
CO2: 32 mmol/L (ref 22–32)
Calcium: 8.6 mg/dL — ABNORMAL LOW (ref 8.9–10.3)
Chloride: 94 mmol/L — ABNORMAL LOW (ref 98–111)
Creatinine, Ser: 1.28 mg/dL — ABNORMAL HIGH (ref 0.61–1.24)
GFR calc Af Amer: 60 mL/min — ABNORMAL LOW (ref >60)
GFR calc non Af Amer: 51 mL/min — ABNORMAL LOW (ref >60)
Glucose, Bld: 136 mg/dL — ABNORMAL HIGH (ref 70–99)
Potassium: 3.3 mmol/L — ABNORMAL LOW (ref 3.5–5.1)
Sodium: 135 mmol/L (ref 135–145)
Total Bilirubin: 0.9 mg/dL (ref 0.3–1.2)
Total Protein: 6.4 g/dL — ABNORMAL LOW (ref 6.5–8.1)

## 2020-03-10 LAB — MAGNESIUM: Magnesium: 2 mg/dL (ref 1.7–2.4)

## 2020-03-10 MED ORDER — SODIUM CHLORIDE 0.9 % IV SOLN
Freq: Once | INTRAVENOUS | Status: AC
  Filled 2020-03-10: qty 250

## 2020-03-10 MED ORDER — DEXAMETHASONE SODIUM PHOSPHATE 10 MG/ML IJ SOLN
10.0000 mg | Freq: Once | INTRAMUSCULAR | Status: AC
  Administered 2020-03-10: 10 mg via INTRAVENOUS

## 2020-03-10 NOTE — Progress Notes (Addendum)
Symptom Management Consult note [facility]  [phone] Fax:([phone]  Patient Care Team: [name], MD as PCP - General (Family Medicine) [name], MD as Consulting Physician (Oncology)   Name of the patient: Scott Gross  [id number]  01-Oct-1934   Date of visit: 03/10/2020   Diagnosis- 1. Cancer of upper lobe of left lung (HCC) - CBC with Differential; Future - Comprehensive metabolic panel; Future - Magnesium; Future - Magnesium - Comprehensive metabolic panel - CBC with Differential  2. Weakness - CBC with Differential; Future - Comprehensive metabolic panel; Future - Magnesium; Future - Magnesium - Comprehensive metabolic panel - CBC with Differential   Chief complaint/ Reason for visit-weak, dizzy and near syncopal episode  Heme/Onc history:  Oncology History   No history exists.   Interval history-Scott Gross is a 86 year old male with past medical history significant for CAD, hypertension, MI, atrial fibrillation with RVR, type 2 diabetes, hyperlipidemia and stage I left lung cancer s/p  SBRT for stage I adenocarcinoma of the lung and status post salvage radiation therapy to left lung area about 8 months ago wo recently completed radiation to T10 who presents to symptom management today for complaints of dizziness, fatigue and near syncopal episode.   Scott Gross states that after he completed his recent radiation to his thoracic spine, he has progressively become weaker and more fatigued.  He has been eating very little.  He is drinking mostly liquids with caffeine such as coffee and iced tea.  He has a significant cardiac history and is on several different blood pressure medicines.  They have not checked his blood pressure at home.  He feels like his legs will "just give out".  He denies any recent fever or illness.  He has and occasional cough that is actually improved.  He has a very mild sinus congestion  and sore throat.  He denies any chest pain, nausea, vomiting, constipation or diarrhea.  ECOG FS:2 - Symptomatic, <50% confined to bed  Review of systems- Review of Systems  Constitutional: Positive for malaise/fatigue. Negative for chills, fever and weight loss.  HENT: Positive for sinus pain and sore throat. Negative for congestion, ear pain and tinnitus.   Eyes: Negative.  Negative for blurred vision and double vision.  Respiratory: Positive for cough. Negative for sputum production and shortness of breath.   Cardiovascular: Negative.  Negative for chest pain, palpitations and leg swelling.  Gastrointestinal: Negative.  Negative for abdominal pain, constipation, diarrhea, nausea and vomiting.  Genitourinary: Negative for dysuria, frequency and urgency.  Musculoskeletal: Negative for back pain and falls.  Skin: Negative.  Negative for rash.  Neurological: Positive for weakness. Negative for headaches.  Endo/Heme/Allergies: Negative.  Does not bruise/bleed easily.  Psychiatric/Behavioral: Negative.  Negative for depression. The patient is not nervous/anxious and does not have insomnia.      Current treatment-status post radiation-final treatment was on 03/02/2020.  Allergies  Allergen Reactions  . Statins Other (See Comments)    Arthralagia     Past Medical History:  Diagnosis Date  . Arthritis   . CAD (coronary artery disease) Nov. 12, 2012   Inferior ST elevation MI in November of 2012 with ventricular fibrillation arrest. Status post drug-eluting stent placement to the mid LAD. Most recent cardiac catheterization in June of this year showed patent stent with minimal restenosis, 75% proximal LAD and 80% distal LAD which were unchanged from before. Mild disease in the left circumflex and moderate RCA disease. Ejection  fraction was 55%.  . Cancer of upper lobe of left lung ([place]) 11/2016   Rad tx's.  . Cardiac arrest [facility]) Nov. 2012   x 2   . Cardiac arrest - ventricular  fibrillation   . HOH (hard of hearing)    Left Hearing Aid  . Hyperlipidemia   . Hypertension   . Post PTCA 2013   x2  . ST elevation MI (STEMI) ([place]) 08/26/2011     Past Surgical History:  Procedure Laterality Date  . CARDIAC CATHETERIZATION  2013   @ [place]; [facility] s/p stent   . CORONARY ANGIOPLASTY    . ELECTROMAGNETIC NAVIGATION BROCHOSCOPY N/A 11/27/2016   Procedure: ELECTROMAGNETIC NAVIGATION BRONCHOSCOPY;  Surgeon: [name], MD;  Location: [facility] ORS;  Service: Cardiopulmonary;  Laterality: N/A;    Social History   Socioeconomic History  . Marital status: Married    Spouse name: Not on file  . Number of children: Not on file  . Years of education: Not on file  . Highest education level: Not on file  Occupational History  . Not on file  Tobacco Use  . Smoking status: Former Smoker    Packs/day: 1.00    Types: Cigarettes    Quit date: 08/27/2011    Years since quitting: 8.5  . Smokeless tobacco: Never Used  Substance and Sexual Activity  . Alcohol use: No  . Drug use: No  . Sexual activity: Not Currently  Other Topics Concern  . Not on file  Social History Narrative  . Not on file   Social Determinants of Health   Financial Resource Strain: Low Risk   . Difficulty of Paying Living Expenses: Not hard at all  Food Insecurity: Unknown  . Worried About Charity fundraiser in the Last Year: Patient refused  . Ran Out of Food in the Last Year: Patient refused  Transportation Needs: Unknown  . Lack of Transportation (Medical): Patient refused  . Lack of Transportation (Non-Medical): Patient refused  Physical Activity: Unknown  . Days of Exercise per Week: Patient refused  . Minutes of Exercise per Session: Patient refused  Stress: No Stress Concern Present  . Feeling of Stress : Not at all  Social Connections: Unknown  . Frequency of Communication with Friends and Family: Patient refused  . Frequency of Social Gatherings with Friends and Family: Patient  refused  . Attends Religious Services: Patient refused  . Active Member of Clubs or Organizations: Patient refused  . Attends Archivist Meetings: Patient refused  . Marital Status: Patient refused  Intimate Partner Violence: Unknown  . Fear of Current or Ex-Partner: Patient refused  . Emotionally Abused: Patient refused  . Physically Abused: Patient refused  . Sexually Abused: Patient refused    Family History  Problem Relation Age of Onset  . Heart attack Mother   . Heart attack Father      Current Outpatient Medications:  .  amLODipine (NORVASC) 5 MG tablet, Take 5 mg by mouth 2 (two) times daily as needed. , Disp: , Rfl:  .  apixaban (ELIQUIS) 5 MG TABS tablet, Take 1 tablet (5 mg total) by mouth 2 (two) times daily., Disp: 60 tablet, Rfl: 0 .  labetalol (NORMODYNE) 100 MG tablet, Take 100 mg by mouth as needed. Only if bp is 150/90, Disp: , Rfl:  .  lisinopril-hydrochlorothiazide (ZESTORETIC) 10-12.5 MG tablet, Take 1 tablet by mouth daily., Disp: , Rfl:  .  metoprolol succinate (TOPROL-XL) 50 MG 24 hr tablet, Take 50  mg by mouth daily., Disp: , Rfl:  .  traMADol (ULTRAM) 50 MG tablet, Take 1 tablet (50 mg total) by mouth 2 (two) times daily as needed for moderate pain., Disp: 60 tablet, Rfl: 0 No current facility-administered medications for this visit.  Facility-Administered Medications Ordered in Other Visits:  .  dexamethasone (DECADRON) injection 10 mg, 10 mg, Intravenous, Once, Jacquelin Hawking, NP  Physical exam:  Vitals:   03/10/20 1442  BP: 98/70  Pulse: 80  Resp: 18  Temp: (!) 97.4 F (36.3 C)  TempSrc: Tympanic  SpO2: 99%   Physical Exam Constitutional:      Appearance: Normal appearance.  HENT:     Head: Normocephalic and atraumatic.  Eyes:     Pupils: Pupils are equal, round, and reactive to light.  Cardiovascular:     Rate and Rhythm: Normal rate and regular rhythm.     Heart sounds: Normal heart sounds. No murmur.  Pulmonary:      Effort: Pulmonary effort is normal.     Breath sounds: Normal breath sounds. No wheezing.  Abdominal:     General: Bowel sounds are normal. There is no distension.     Palpations: Abdomen is soft.     Tenderness: There is no abdominal tenderness.  Musculoskeletal:        General: Normal range of motion.     Cervical back: Normal range of motion.  Skin:    General: Skin is warm and dry.     Findings: No rash.  Neurological:     Mental Status: He is alert and oriented to person, place, and time.  Psychiatric:        Judgment: Judgment normal.      CMP Latest Ref Rng & Units 03/10/2020  Glucose 70 - 99 mg/dL 136(H)  BUN 8 - 23 mg/dL 16  Creatinine 0.61 - 1.24 mg/dL 1.28(H)  Sodium 135 - 145 mmol/L 135  Potassium 3.5 - 5.1 mmol/L 3.3(L)  Chloride 98 - 111 mmol/L 94(L)  CO2 22 - 32 mmol/L 32  Calcium 8.9 - 10.3 mg/dL 8.6(L)  Total Protein 6.5 - 8.1 g/dL 6.4(L)  Total Bilirubin 0.3 - 1.2 mg/dL 0.9  Alkaline Phos 38 - 126 U/L 62  AST 15 - 41 U/L 15  ALT 0 - 44 U/L 12   CBC Latest Ref Rng & Units 03/10/2020  WBC 4.0 - 10.5 K/uL 10.1  Hemoglobin 13.0 - 17.0 g/dL 15.5  Hematocrit 39.0 - 52.0 % 44.1  Platelets 150 - 400 K/uL 230    No images are attached to the encounter.  No results found. Assessment and plan- Patient is a 86 y.o. male who presents to symptom management for concerns of dehydration, weakness, fatigue and near syncopal episode.  Dehydration likely due to recent radiation.  Most recent imaging on 01/20/2020 showed fairly stable disease with T10 sclerotic metastasis.  Patient reports feeling dizzy especially with standing.  While in clinic patient was hypotensive and orthostatic.  We will go ahead and give a liter of normal saline along with 10 mg of dexamethasone.  Labs are fairly unremarkable.  Mild elevation his creatinine and mild hypokalemia.  Blood pressure improved with normal saline.  Patient on several blood pressure medicines.  Have reached out to Dr. Clayborn Bigness  his cardiologist to make sure he does not want to adjust any of his medications.  I have asked that he keep an eye on his blood pressure at home and to let us know if his blood pressure  becomes low.  Disposition: RTC on Tuesday, 03/15/2020 for additional IV fluids and lab work.  Addendum: Spoke to Dr. Clayborn Bigness (cardiologist) about profound hypotension and orthostasis and he recommended cutting ALL BP meds in half at this time to prevent further near syncope events. Dr. Clayborn Bigness will call patients daughter to review changes.     Visit Diagnosis 1. Cancer of upper lobe of left lung ([place])   2. Weakness     Patient expressed understanding and was in agreement with this plan. He also understands that He can call clinic at any time with any questions, concerns, or complaints.   Greater than 50% was spent in counseling and coordination of care with this patient including but not limited to discussion of the relevant topics above (See A&P) including, but not limited to diagnosis and management of acute and chronic medical conditions.   Thank you for allowing me to participate in the care of this very pleasant patient.    Jacquelin Hawking, NP [place] at [facility] Cell - [phone] Pager- [phone] 03/10/2020 3:36 PM

## 2020-03-11 ENCOUNTER — Telehealth: Payer: Self-pay | Admitting: *Deleted

## 2020-03-11 NOTE — Telephone Encounter (Signed)
Call returned to daughter and advised of samples being left at desk. She is asking that instructions be given with the samples as well

## 2020-03-11 NOTE — Telephone Encounter (Signed)
Okay I will take them downstairs now. Do you mind calling her back and letting her know.   Faythe Casa, NP 03/11/2020 10:58 AM

## 2020-03-11 NOTE — Telephone Encounter (Signed)
Daughter called reporting hta patient was to be given some samples of electrolyte powder to take She is requesting that they be left at reception desk for her to pick up and call her when ready [phone]

## 2020-03-15 ENCOUNTER — Other Ambulatory Visit: Payer: Self-pay | Admitting: Oncology

## 2020-03-15 ENCOUNTER — Inpatient Hospital Stay: Payer: Medicare HMO | Attending: Oncology

## 2020-03-15 ENCOUNTER — Inpatient Hospital Stay: Payer: Medicare HMO

## 2020-03-15 ENCOUNTER — Other Ambulatory Visit: Payer: Self-pay

## 2020-03-15 ENCOUNTER — Inpatient Hospital Stay (HOSPITAL_BASED_OUTPATIENT_CLINIC_OR_DEPARTMENT_OTHER): Payer: Medicare HMO | Admitting: Oncology

## 2020-03-15 DIAGNOSIS — E785 Hyperlipidemia, unspecified: Secondary | ICD-10-CM | POA: Insufficient documentation

## 2020-03-15 DIAGNOSIS — I951 Orthostatic hypotension: Secondary | ICD-10-CM | POA: Insufficient documentation

## 2020-03-15 DIAGNOSIS — I4901 Ventricular fibrillation: Secondary | ICD-10-CM | POA: Insufficient documentation

## 2020-03-15 DIAGNOSIS — C3412 Malignant neoplasm of upper lobe, left bronchus or lung: Secondary | ICD-10-CM | POA: Diagnosis not present

## 2020-03-15 DIAGNOSIS — I252 Old myocardial infarction: Secondary | ICD-10-CM | POA: Insufficient documentation

## 2020-03-15 DIAGNOSIS — I2510 Atherosclerotic heart disease of native coronary artery without angina pectoris: Secondary | ICD-10-CM | POA: Diagnosis not present

## 2020-03-15 DIAGNOSIS — Z7901 Long term (current) use of anticoagulants: Secondary | ICD-10-CM | POA: Insufficient documentation

## 2020-03-15 DIAGNOSIS — Z79899 Other long term (current) drug therapy: Secondary | ICD-10-CM | POA: Diagnosis not present

## 2020-03-15 DIAGNOSIS — R531 Weakness: Secondary | ICD-10-CM | POA: Diagnosis not present

## 2020-03-15 DIAGNOSIS — I10 Essential (primary) hypertension: Secondary | ICD-10-CM | POA: Diagnosis not present

## 2020-03-15 DIAGNOSIS — I219 Acute myocardial infarction, unspecified: Secondary | ICD-10-CM | POA: Diagnosis not present

## 2020-03-15 DIAGNOSIS — E860 Dehydration: Secondary | ICD-10-CM | POA: Insufficient documentation

## 2020-03-15 DIAGNOSIS — Z87891 Personal history of nicotine dependence: Secondary | ICD-10-CM | POA: Insufficient documentation

## 2020-03-15 LAB — CBC WITH DIFFERENTIAL/PLATELET
Abs Immature Granulocytes: 0.24 10*3/uL — ABNORMAL HIGH (ref 0.00–0.07)
Basophils Absolute: 0.1 10*3/uL (ref 0.0–0.1)
Basophils Relative: 1 %
Eosinophils Absolute: 0.2 10*3/uL (ref 0.0–0.5)
Eosinophils Relative: 2 %
HCT: 44.9 % (ref 39.0–52.0)
Hemoglobin: 15.9 g/dL (ref 13.0–17.0)
Immature Granulocytes: 2 %
Lymphocytes Relative: 15 %
Lymphs Abs: 1.6 10*3/uL (ref 0.7–4.0)
MCH: 31.9 pg (ref 26.0–34.0)
MCHC: 35.4 g/dL (ref 30.0–36.0)
MCV: 90.2 fL (ref 80.0–100.0)
Monocytes Absolute: 1 10*3/uL (ref 0.1–1.0)
Monocytes Relative: 9 %
Neutro Abs: 8.1 10*3/uL — ABNORMAL HIGH (ref 1.7–7.7)
Neutrophils Relative %: 71 %
Platelets: 213 10*3/uL (ref 150–400)
RBC: 4.98 MIL/uL (ref 4.22–5.81)
RDW: 12.3 % (ref 11.5–15.5)
WBC: 11.2 10*3/uL — ABNORMAL HIGH (ref 4.0–10.5)
nRBC: 0 % (ref 0.0–0.2)

## 2020-03-15 LAB — COMPREHENSIVE METABOLIC PANEL
ALT: 17 U/L (ref 0–44)
AST: 16 U/L (ref 15–41)
Albumin: 3.6 g/dL (ref 3.5–5.0)
Alkaline Phosphatase: 82 U/L (ref 38–126)
Anion gap: 9 (ref 5–15)
BUN: 18 mg/dL (ref 8–23)
CO2: 31 mmol/L (ref 22–32)
Calcium: 8.5 mg/dL — ABNORMAL LOW (ref 8.9–10.3)
Chloride: 98 mmol/L (ref 98–111)
Creatinine, Ser: 0.94 mg/dL (ref 0.61–1.24)
GFR calc Af Amer: >60 mL/min (ref >60)
GFR calc non Af Amer: >60 mL/min (ref >60)
Glucose, Bld: 109 mg/dL — ABNORMAL HIGH (ref 70–99)
Potassium: 4.2 mmol/L (ref 3.5–5.1)
Sodium: 138 mmol/L (ref 135–145)
Total Bilirubin: 0.6 mg/dL (ref 0.3–1.2)
Total Protein: 6.4 g/dL — ABNORMAL LOW (ref 6.5–8.1)

## 2020-03-15 NOTE — Progress Notes (Signed)
Symptom Management Consult note [facility]  [phone] [phone]  Patient Care Team: [name], MD as PCP - General (Family Medicine) [name], MD as Consulting Physician (Oncology)   Name of the patient: Scott Gross  [id number]  12/10/1933   Date of visit: 03/15/2020   Diagnosis- 1. Cancer of upper lobe of left lung (HCC) - CBC with Differential; Future - Comprehensive metabolic panel; Future - Magnesium; Future - Magnesium - Comprehensive metabolic panel - CBC with Differential  2. Weakness - CBC with Differential; Future - Comprehensive metabolic panel; Future - Magnesium; Future - Magnesium - Comprehensive metabolic panel - CBC with Differential   Chief complaint/ Reason for visit-Follow-up  Heme/Onc history:  Oncology History   No history exists.   Interval history-Scott Gross is a 86 year old male with past medical history significant for CAD, hypertension, MI, atrial fibrillation with RVR, type 2 diabetes, hyperlipidemia and stage I left lung cancer s/p  SBRT for stage I adenocarcinoma of the lung and status post salvage radiation therapy to left lung area about 8 months ago who recently completed radiation to T10 who presents to symptom management today for follow-up and additional lab work.   Scott Gross was evaluated in [facility] on 03/11/2020 for complaints of dizziness, fatigue and a near syncopal episode.  He noted progressive weakness since completing radiation to his thoracic spine.  His appetite had declined dramatically.  While in clinic he was given a liter of normal saline and 10 mg dexamethasone.  Blood pressure revealed orthostatic hypotension which improved with fluids.  Lab work unremarkable and stable.  Spoke with Dr. Clayborn Bigness (cardiologist) who recommends cutting blood pressure medicines in half until his appetite improves.   Today, Scott Gross presents with his wife stating that "he feels  great".  His appetite has improved and he denies any further episodes of dizziness or near syncopal episodes.  He has been drinking electrolyte supplement packets and believes they are helping.  He has cut his blood pressure medicines in half as instructed.  He denies any additional concerns today.   ECOG FS:2 - Symptomatic, <50% confined to bed  Review of systems- Review of Systems  Constitutional: Negative.  Negative for chills, fever, malaise/fatigue and weight loss.  HENT: Negative for congestion, ear pain and tinnitus.   Eyes: Negative.  Negative for blurred vision and double vision.  Respiratory: Negative.  Negative for cough, sputum production and shortness of breath.   Cardiovascular: Negative.  Negative for chest pain, palpitations and leg swelling.  Gastrointestinal: Negative.  Negative for abdominal pain, constipation, diarrhea, nausea and vomiting.  Genitourinary: Negative for dysuria, frequency and urgency.  Musculoskeletal: Negative for back pain and falls.  Skin: Negative.  Negative for rash.  Neurological: Negative.  Negative for weakness and headaches.  Endo/Heme/Allergies: Negative.  Does not bruise/bleed easily.  Psychiatric/Behavioral: Negative.  Negative for depression. The patient is not nervous/anxious and does not have insomnia.      Current treatment-status post radiation-final treatment was on 03/02/2020.  Allergies  Allergen Reactions  . Statins Other (See Comments)    Arthralagia     Past Medical History:  Diagnosis Date  . Arthritis   . CAD (coronary artery disease) Nov. 12, 2012   Inferior ST elevation MI in November of 2012 with ventricular fibrillation arrest. Status post drug-eluting stent placement to the mid LAD. Most recent cardiac catheterization in June of this year showed patent stent with minimal restenosis, 75% proximal LAD and 80%  distal LAD which were unchanged from before. Mild disease in the left circumflex and moderate RCA disease.  Ejection fraction was 55%.  . Cancer of upper lobe of left lung ([place]) 11/2016   Rad tx's.  . Cardiac arrest [facility]) Nov. 2012   x 2   . Cardiac arrest - ventricular fibrillation   . HOH (hard of hearing)    Left Hearing Aid  . Hyperlipidemia   . Hypertension   . Post PTCA 2013   x2  . ST elevation MI (STEMI) ([place]) 08/26/2011     Past Surgical History:  Procedure Laterality Date  . CARDIAC CATHETERIZATION  2013   @ [place]; [facility] s/p stent   . CORONARY ANGIOPLASTY    . ELECTROMAGNETIC NAVIGATION BROCHOSCOPY N/A 11/27/2016   Procedure: ELECTROMAGNETIC NAVIGATION BRONCHOSCOPY;  Surgeon: [name], MD;  Location: [facility] ORS;  Service: Cardiopulmonary;  Laterality: N/A;    Social History   Socioeconomic History  . Marital status: Married    Spouse name: Not on file  . Number of children: Not on file  . Years of education: Not on file  . Highest education level: Not on file  Occupational History  . Not on file  Tobacco Use  . Smoking status: Former Smoker    Packs/day: 1.00    Types: Cigarettes    Quit date: 08/27/2011    Years since quitting: 8.5  . Smokeless tobacco: Never Used  Substance and Sexual Activity  . Alcohol use: No  . Drug use: No  . Sexual activity: Not Currently  Other Topics Concern  . Not on file  Social History Narrative  . Not on file   Social Determinants of Health   Financial Resource Strain: Low Risk   . Difficulty of Paying Living Expenses: Not hard at all  Food Insecurity: Unknown  . Worried About Charity fundraiser in the Last Year: Patient refused  . Ran Out of Food in the Last Year: Patient refused  Transportation Needs: Unknown  . Lack of Transportation (Medical): Patient refused  . Lack of Transportation (Non-Medical): Patient refused  Physical Activity: Unknown  . Days of Exercise per Week: Patient refused  . Minutes of Exercise per Session: Patient refused  Stress: No Stress Concern Present  . Feeling of Stress : Not at all    Social Connections: Unknown  . Frequency of Communication with Friends and Family: Patient refused  . Frequency of Social Gatherings with Friends and Family: Patient refused  . Attends Religious Services: Patient refused  . Active Member of Clubs or Organizations: Patient refused  . Attends Archivist Meetings: Patient refused  . Marital Status: Patient refused  Intimate Partner Violence: Unknown  . Fear of Current or Ex-Partner: Patient refused  . Emotionally Abused: Patient refused  . Physically Abused: Patient refused  . Sexually Abused: Patient refused    Family History  Problem Relation Age of Onset  . Heart attack Mother   . Heart attack Father      Current Outpatient Medications:  .  amLODipine (NORVASC) 5 MG tablet, Take 5 mg by mouth 2 (two) times daily as needed. , Disp: , Rfl:  .  apixaban (ELIQUIS) 5 MG TABS tablet, Take 1 tablet (5 mg total) by mouth 2 (two) times daily., Disp: 60 tablet, Rfl: 0 .  labetalol (NORMODYNE) 100 MG tablet, Take 100 mg by mouth as needed. Only if bp is 150/90, Disp: , Rfl:  .  lisinopril-hydrochlorothiazide (ZESTORETIC) 10-12.5 MG tablet, Take 1 tablet  by mouth daily., Disp: , Rfl:  .  metoprolol succinate (TOPROL-XL) 50 MG 24 hr tablet, Take 50 mg by mouth daily., Disp: , Rfl:  .  traMADol (ULTRAM) 50 MG tablet, Take 1 tablet (50 mg total) by mouth 2 (two) times daily as needed for moderate pain., Disp: 60 tablet, Rfl: 0  Physical exam:  There were no vitals filed for this visit. Physical Exam Constitutional:      Appearance: Normal appearance.  HENT:     Head: Normocephalic and atraumatic.  Eyes:     Pupils: Pupils are equal, round, and reactive to light.  Cardiovascular:     Rate and Rhythm: Normal rate and regular rhythm.     Heart sounds: Normal heart sounds. No murmur.  Pulmonary:     Effort: Pulmonary effort is normal.     Breath sounds: Normal breath sounds. No wheezing.  Abdominal:     General: Bowel sounds are  normal. There is no distension.     Palpations: Abdomen is soft.     Tenderness: There is no abdominal tenderness.  Musculoskeletal:        General: Normal range of motion.     Cervical back: Normal range of motion.  Skin:    General: Skin is warm and dry.     Findings: No rash.  Neurological:     Mental Status: He is alert and oriented to person, place, and time.  Psychiatric:        Judgment: Judgment normal.      CMP Latest Ref Rng & Units 03/15/2020  Glucose 70 - 99 mg/dL 109(H)  BUN 8 - 23 mg/dL 18  Creatinine 0.61 - 1.24 mg/dL 0.94  Sodium 135 - 145 mmol/L 138  Potassium 3.5 - 5.1 mmol/L 4.2  Chloride 98 - 111 mmol/L 98  CO2 22 - 32 mmol/L 31  Calcium 8.9 - 10.3 mg/dL 8.5(L)  Total Protein 6.5 - 8.1 g/dL 6.4(L)  Total Bilirubin 0.3 - 1.2 mg/dL 0.6  Alkaline Phos 38 - 126 U/L 82  AST 15 - 41 U/L 16  ALT 0 - 44 U/L 17   CBC Latest Ref Rng & Units 03/15/2020  WBC 4.0 - 10.5 K/uL 11.2(H)  Hemoglobin 13.0 - 17.0 g/dL 15.9  Hematocrit 39.0 - 52.0 % 44.9  Platelets 150 - 400 K/uL 213    No images are attached to the encounter.  No results found. Assessment and plan- Patient is a 86 y.o. male who presents to symptom management for follow-up for dehydration and weakness requiring IV fluids last week.  Scott Gross is feeling much better after receiving fluids and steroids.  He presents back for lab work and possible IV fluids.  Blood pressure has improved and is 142/81.  Continues to be mildly orthostatic with a drop in his blood pressure to 118/70 with standing.  Continue to half blood pressure medicines until follow-up with Dr. Clayborn Bigness.   Lab work today has improved.  No additional IV fluids needed.  Encouraged hydration.  Additional Ensure hydration packets given to patient.  Disposition: RTC on 04/07/2020 for follow-up with Dr. Donella Stade. RTC on 06/03/2020 for follow-up with Dr. Grayland Ormond.   Visit Diagnosis 1. Orthostatic hypotension   2. Dehydration     Patient  expressed understanding and was in agreement with this plan. He also understands that He can call clinic at any time with any questions, concerns, or complaints.   Greater than 50% was spent in counseling and coordination of care with this patient  including but not limited to discussion of the relevant topics above (See A&P) including, but not limited to diagnosis and management of acute and chronic medical conditions.   Thank you for allowing me to participate in the care of this very pleasant patient.    Jacquelin Hawking, NP [place] at [facility] - [phone] Pager- [phone] 03/15/2020 1:18 PM

## 2020-03-29 DIAGNOSIS — E1151 Type 2 diabetes mellitus with diabetic peripheral angiopathy without gangrene: Secondary | ICD-10-CM | POA: Diagnosis not present

## 2020-03-29 DIAGNOSIS — I208 Other forms of angina pectoris: Secondary | ICD-10-CM | POA: Diagnosis not present

## 2020-03-29 DIAGNOSIS — I480 Paroxysmal atrial fibrillation: Secondary | ICD-10-CM | POA: Diagnosis not present

## 2020-03-29 DIAGNOSIS — I10 Essential (primary) hypertension: Secondary | ICD-10-CM | POA: Diagnosis not present

## 2020-03-29 DIAGNOSIS — Z0000 Encounter for general adult medical examination without abnormal findings: Secondary | ICD-10-CM | POA: Diagnosis not present

## 2020-03-30 ENCOUNTER — Ambulatory Visit: Payer: Medicare HMO

## 2020-03-30 DIAGNOSIS — I208 Other forms of angina pectoris: Secondary | ICD-10-CM | POA: Diagnosis not present

## 2020-03-30 DIAGNOSIS — I701 Atherosclerosis of renal artery: Secondary | ICD-10-CM | POA: Diagnosis not present

## 2020-03-30 DIAGNOSIS — I739 Peripheral vascular disease, unspecified: Secondary | ICD-10-CM | POA: Diagnosis not present

## 2020-03-30 DIAGNOSIS — I480 Paroxysmal atrial fibrillation: Secondary | ICD-10-CM | POA: Diagnosis not present

## 2020-03-30 DIAGNOSIS — I10 Essential (primary) hypertension: Secondary | ICD-10-CM | POA: Diagnosis not present

## 2020-03-30 DIAGNOSIS — T66XXXS Radiation sickness, unspecified, sequela: Secondary | ICD-10-CM | POA: Diagnosis not present

## 2020-03-30 DIAGNOSIS — E119 Type 2 diabetes mellitus without complications: Secondary | ICD-10-CM | POA: Diagnosis not present

## 2020-03-30 DIAGNOSIS — R55 Syncope and collapse: Secondary | ICD-10-CM | POA: Diagnosis not present

## 2020-03-30 DIAGNOSIS — C3412 Malignant neoplasm of upper lobe, left bronchus or lung: Secondary | ICD-10-CM | POA: Diagnosis not present

## 2020-04-07 ENCOUNTER — Ambulatory Visit
Admission: RE | Admit: 2020-04-07 | Discharge: 2020-04-07 | Disposition: A | Discharge status: Home / Self Care | Payer: Medicare HMO | Source: Ambulatory Visit | Attending: Radiation Oncology | Admitting: Radiation Oncology

## 2020-04-07 ENCOUNTER — Other Ambulatory Visit: Payer: Self-pay

## 2020-04-07 ENCOUNTER — Ambulatory Visit: Payer: Medicare HMO | Admitting: Oncology

## 2020-04-07 VITALS — BP 134/86 | HR 79 | Temp 96.6°F | Resp 16 | Wt 130.5 lb

## 2020-04-07 DIAGNOSIS — C3490 Malignant neoplasm of unspecified part of unspecified bronchus or lung: Secondary | ICD-10-CM

## 2020-04-07 NOTE — Progress Notes (Signed)
Radiation Oncology Follow up Note  Name: Scott Gross   Date:   04/07/2020 MRN:  [id number] DOB: 1934-05-16    This 86 y.o. male presents to the clinic today for 1 month follow-up status post.  Palliative radiation therapy to T10 vertebral body and patient status post SBRT 10 months prior for stage I adenocarcinoma  REFERRING PROVIDER: [name], MD  HPI: Patient is a 86 year old male now 1 month out from palliative radiation therapy to his T10 vertebral body for metastatic adenocarcinoma.  He underwent SBRT 10 months prior.  He is seen today in routine follow-up is doing fairly well..  He has been seen by symptom management has recently had some of his blood pressure medications by Dr. Landry Mellow would decreased.  He was having some orthostatic hypotension.  He does complain of some mid shoulder pain may be related to pain he has had most of his adult life and is comfortable currently on tramadol for that.  Patient does have a PET scan in August ordered.  COMPLICATIONS OF TREATMENT: none  FOLLOW UP COMPLIANCE: keeps appointments   PHYSICAL EXAM:  BP 134/86   Pulse 79   Temp (!) 96.6 F (35.9 C)   Resp 16   Wt 130 lb 8 oz (59.2 kg)   SpO2 100%   BMI 19.84 kg/m  Well-developed well-nourished patient in NAD. HEENT reveals PERLA, EOMI, discs not visualized.  Oral cavity is clear. No oral mucosal lesions are identified. Neck is clear without evidence of cervical or supraclavicular adenopathy. Lungs are clear to A&P. Cardiac examination is essentially unremarkable with regular rate and rhythm without murmur rub or thrill. Abdomen is benign with no organomegaly or masses noted. Motor sensory and DTR levels are equal and symmetric in the upper and lower extremities. Cranial nerves II through XII are grossly intact. Proprioception is intact. No peripheral adenopathy or edema is identified. No motor or sensory levels are noted. Crude visual fields are within normal range.  RADIOLOGY RESULTS: No  current films to review  PLAN: Present time is that she had good palliation from radiation therapy to his spine.  He continues close follow-up care with medical oncology and symptom management.  I have asked to see him back in 4 to 5 months for follow-up.  We will review his PET CT scan when he becomes available.  I would like to take this opportunity to thank you for allowing me to participate in the care of your patient.Noreene Filbert, MD

## 2020-05-27 ENCOUNTER — Other Ambulatory Visit: Payer: Self-pay

## 2020-05-27 DIAGNOSIS — C3412 Malignant neoplasm of upper lobe, left bronchus or lung: Secondary | ICD-10-CM

## 2020-05-28 NOTE — Progress Notes (Deleted)
Scott Gross  [phone] [phone]  ID: STEDMAN [place] OB: [birth year]  MR#: [id number]  KYH#:[id number]  Patient Care Team: [name], MD as PCP - General (Family Medicine) [name], MD as Consulting Physician (Oncology)   CHIEF COMPLAINT: Adenocarcinoma of upper lobe of left lung.  INTERVAL HISTORY: Patient returns to clinic today for further evaluation.  He has now initiated XRT to the known metastatic lesion in his T10 vertebra.  He reports no significant change in his back pain.  He continues to complain of bilateral shoulder pain that has been present for decades.  He has chronic weakness and fatigue.   He has a fair appetite and denies weight loss.  He has no neurologic complaints. He denies any chest pain, cough, hemoptysis, or shortness of breath. He has no nausea, vomiting, constipation, or diarrhea. He has no urinary complaints.  Patient offers no further specific complaints today.  REVIEW OF SYSTEMS:   Review of Systems  Constitutional: Positive for malaise/fatigue. Negative for fever and weight loss.  Respiratory: Negative.  Negative for cough, hemoptysis and shortness of breath.   Cardiovascular: Negative.  Negative for chest pain and leg swelling.  Gastrointestinal: Negative.  Negative for abdominal pain.  Genitourinary: Negative.  Negative for dysuria.  Musculoskeletal: Positive for back pain and joint pain.  Skin: Negative.  Negative for rash.  Neurological: Positive for weakness. Negative for sensory change, focal weakness and headaches.  Psychiatric/Behavioral: Negative.  The patient is not nervous/anxious.     As per HPI. Otherwise, a complete review of systems is negative.  PAST MEDICAL HISTORY: Past Medical History:  Diagnosis Date  . Arthritis   . CAD (coronary artery disease) Nov. 12, 2012   Inferior ST elevation MI in November of 2012 with ventricular fibrillation arrest. Status post drug-eluting stent  placement to the mid LAD. Most recent cardiac catheterization in June of this year showed patent stent with minimal restenosis, 75% proximal LAD and 80% distal LAD which were unchanged from before. Mild disease in the left circumflex and moderate RCA disease. Ejection fraction was 55%.  . Cancer of upper lobe of left lung ([place]) 11/2016   Rad tx's.  . Cardiac arrest [facility]) Nov. 2012   x 2   . Cardiac arrest - ventricular fibrillation   . HOH (hard of hearing)    Left Hearing Aid  . Hyperlipidemia   . Hypertension   . Post PTCA 2013   x2  . ST elevation MI (STEMI) ([place]) 08/26/2011    PAST SURGICAL HISTORY: Past Surgical History:  Procedure Laterality Date  . CARDIAC CATHETERIZATION  2013   @ [place]; [facility] s/p stent   . CORONARY ANGIOPLASTY    . ELECTROMAGNETIC NAVIGATION BROCHOSCOPY N/A 11/27/2016   Procedure: ELECTROMAGNETIC NAVIGATION BRONCHOSCOPY;  Surgeon: [name], MD;  Location: [facility] ORS;  Service: Cardiopulmonary;  Laterality: N/A;    FAMILY HISTORY: Family History  Problem Relation Age of Onset  . Heart attack Mother   . Heart attack Father     ADVANCED DIRECTIVES (Y/N):  N  HEALTH MAINTENANCE: Social History   Tobacco Use  . Smoking status: Former Smoker    Packs/day: 1.00    Types: Cigarettes    Quit date: 08/27/2011    Years since quitting: 8.7  . Smokeless tobacco: Never Used  Vaping Use  . Vaping Use: Never used  Substance Use Topics  . Alcohol use: No  . Drug use: No     Colonoscopy:  PAP:  Bone density:  Lipid panel:  Allergies  Allergen Reactions  . Statins Other (See Comments)    Arthralagia    Current Outpatient Medications  Medication Sig Dispense Refill  . amiodarone (PACERONE) 200 MG tablet     . amLODipine (NORVASC) 5 MG tablet Take 5 mg by mouth 2 (two) times daily as needed.     Marland Kitchen apixaban (ELIQUIS) 5 MG TABS tablet Take 1 tablet (5 mg total) by mouth 2 (two) times daily. 60 tablet 0  . labetalol (NORMODYNE) 100 MG tablet Take  100 mg by mouth as needed. Only if bp is 150/90    . lisinopril-hydrochlorothiazide (ZESTORETIC) 10-12.5 MG tablet Take 1 tablet by mouth daily.    . metoprolol succinate (TOPROL-XL) 50 MG 24 hr tablet Take 50 mg by mouth daily.    . traMADol (ULTRAM) 50 MG tablet Take 1 tablet (50 mg total) by mouth 2 (two) times daily as needed for moderate pain. 60 tablet 0   No current facility-administered medications for this visit.    OBJECTIVE: There were no vitals filed for this visit.   There is no height or weight on file to calculate BMI.    ECOG FS:0 - Asymptomatic  General: Thin, no acute distress. Eyes: Pink conjunctiva, anicteric sclera. HEENT: Normocephalic, moist mucous membranes. Lungs: No audible wheezing or coughing. Heart: Regular rate and rhythm. Abdomen: Soft, nontender, no obvious distention. Musculoskeletal: No edema, cyanosis, or clubbing. Neuro: Alert, answering all questions appropriately. Cranial nerves grossly intact. Skin: No rashes or petechiae noted. Psych: Normal affect.   LAB RESULTS:  Lab Results  Component Value Date   NA 138 03/15/2020   K 4.2 03/15/2020   CL 98 03/15/2020   CO2 31 03/15/2020   GLUCOSE 109 (H) 03/15/2020   BUN 18 03/15/2020   CREATININE 0.94 03/15/2020   CALCIUM 8.5 (L) 03/15/2020   PROT 6.4 (L) 03/15/2020   ALBUMIN 3.6 03/15/2020   AST 16 03/15/2020   ALT 17 03/15/2020   ALKPHOS 82 03/15/2020   BILITOT 0.6 03/15/2020   GFRNONAA >60 03/15/2020   GFRAA >60 03/15/2020    Lab Results  Component Value Date   WBC 11.2 (H) 03/15/2020   NEUTROABS 8.1 (H) 03/15/2020   HGB 15.9 03/15/2020   HCT 44.9 03/15/2020   MCV 90.2 03/15/2020   PLT 213 03/15/2020     STUDIES: No results found.  ONCOLOGY HISTORY:  Biopsy of lung nodule on November 27, 2016 revealed adenocarcinoma, but there was no invasive component on the sample. Biopsy of mediastinal lymph node had insufficient material. Patient ultimately declined any further biopsies or  surgery and elected to proceed with XRT alone which he completed in April 2018.  PET scan on January 22, 2019 reviewed independently with increased metabolic activity prior to previous with SUV increasing from 6.5 up to 8.8.  Patient declined intervention at that time and elected to proceed with simple observation.  Repeat CT scan on April 21, 2019 reviewed independently with progressive 6.4 cm mass in the left upper lobe.  Patient declined systemic treatment, but agreed to proceed with XRT which she completed in approximately August 2020.  CT scan on September 23, 2019 reviewed independently with no obvious evidence of recurrent or progressive disease.   ASSESSMENT:  Adenocarcinoma of upper lobe of left lung   PLAN:   1.  Adenocarcinoma of upper lobe of left lung:  Follow-up CT scan on January 06, 2020 reviewed independently with a new right lower lobe 9 mm spiculated  nodule.  There is also a subtle increase in sclerosis in the T10 vertebral body.  PET scan results from 01/20/2020 reviewed independently with no obvious evidence of recurrent or progressive disease in patient's lung, but T10 lesion continues to be suspicious for malignancy.  Patient underwent MRI and then subsequently started XRT to the T10 lesion.  He will complete treatment on Mar 02, 2020.  No further intervention is needed at this time.  Return to clinic in 3 months with repeat imaging using PET scan and further evaluation.  2.  Shoulder/back pain: Patient reports this has been evident for years/decades: Continue tramadol and XRT as above.     Patient expressed understanding and was in agreement with this plan. He also understands that He can call clinic at any time with any questions, concerns, or complaints.     [name], MD   05/28/2020 2:27 PM

## 2020-05-30 ENCOUNTER — Inpatient Hospital Stay: Payer: Medicare HMO | Attending: Oncology

## 2020-05-30 ENCOUNTER — Other Ambulatory Visit: Payer: Self-pay

## 2020-05-30 ENCOUNTER — Encounter
Admission: RE | Admit: 2020-05-30 | Discharge: 2020-05-30 | Disposition: A | Discharge status: Home / Self Care | Payer: Medicare HMO | Source: Ambulatory Visit | Attending: Oncology | Admitting: Oncology

## 2020-05-30 DIAGNOSIS — C7951 Secondary malignant neoplasm of bone: Secondary | ICD-10-CM | POA: Insufficient documentation

## 2020-05-30 DIAGNOSIS — C3492 Malignant neoplasm of unspecified part of left bronchus or lung: Secondary | ICD-10-CM | POA: Diagnosis not present

## 2020-05-30 DIAGNOSIS — C3412 Malignant neoplasm of upper lobe, left bronchus or lung: Secondary | ICD-10-CM | POA: Insufficient documentation

## 2020-05-30 LAB — GLUCOSE, CAPILLARY
Glucose-Capillary: 110 mg/dL — ABNORMAL HIGH (ref 70–99)
Glucose-Capillary: 110 mg/dL — ABNORMAL HIGH (ref 70–99)
Glucose-Capillary: 110 mg/dL — ABNORMAL HIGH (ref 70–99)
Glucose-Capillary: 110 mg/dL — ABNORMAL HIGH (ref 70–99)

## 2020-05-30 MED ORDER — FLUDEOXYGLUCOSE F - 18 (FDG) INJECTION
6.7000 | Freq: Once | INTRAVENOUS | Status: AC | PRN
  Administered 2020-05-30: 7.36 via INTRAVENOUS

## 2020-06-02 NOTE — Progress Notes (Signed)
[place]  [phone] [phone]) [phone]  ID: Scott Gross OB: [birth year]  MR#: [id number]  UDJ#:[id number]  Patient Care Team: [name], MD as PCP - General (Family Medicine) [name], MD as Consulting Physician (Oncology)  I connected with [name] on 06/07/20 at 10:00 AM EDT by video enabled telemedicine visit and verified that I am speaking with the correct person using two identifiers.   I discussed the limitations, risks, security and privacy concerns of performing an evaluation and management service by telemedicine and the availability of in-person appointments. I also discussed with the patient that there may be a patient responsible charge related to this service. The patient expressed understanding and agreed to proceed.   Other persons participating in the visit and their role in the encounter: Patient, MD.  Patient's location: Clinic. Provider's location: Home.  CHIEF COMPLAINT: Adenocarcinoma of upper lobe of left lung.  INTERVAL HISTORY: Patient agreed to video assisted telemedicine visit for further evaluation and discussion of his PET scan results.  He currently feels well and is asymptomatic.  He does not complain of pain today.  He denies any weakness or fatigue.  He has a good appetite and denies weight loss.  He has no neurologic complaints. He denies any chest pain, cough, hemoptysis, or shortness of breath. He has no nausea, vomiting, constipation, or diarrhea. He has no urinary complaints.  Patient offers no specific complaints today.  REVIEW OF SYSTEMS:   Review of Systems  Constitutional: Negative.  Negative for fever, malaise/fatigue and weight loss.  Respiratory: Negative.  Negative for cough, hemoptysis and shortness of breath.   Cardiovascular: Negative.  Negative for chest pain and leg swelling.  Gastrointestinal: Negative.  Negative for abdominal pain.  Genitourinary: Negative.  Negative for  dysuria.  Musculoskeletal: Negative.  Negative for back pain and joint pain.  Skin: Negative.  Negative for rash.  Neurological: Negative.  Negative for sensory change, focal weakness, weakness and headaches.  Psychiatric/Behavioral: Negative.  The patient is not nervous/anxious.     As per HPI. Otherwise, a complete review of systems is negative.  PAST MEDICAL HISTORY: Past Medical History:  Diagnosis Date  . Arthritis   . CAD (coronary artery disease) Nov. 12, 2012   Inferior ST elevation MI in November of 2012 with ventricular fibrillation arrest. Status post drug-eluting stent placement to the mid LAD. Most recent cardiac catheterization in June of this year showed patent stent with minimal restenosis, 75% proximal LAD and 80% distal LAD which were unchanged from before. Mild disease in the left circumflex and moderate RCA disease. Ejection fraction was 55%.  . Cancer of upper lobe of left lung ([place]) 11/2016   Rad tx's.  . Cardiac arrest [facility]) Nov. 2012   x 2   . Cardiac arrest - ventricular fibrillation   . HOH (hard of hearing)    Left Hearing Aid  . Hyperlipidemia   . Hypertension   . Post PTCA 2013   x2  . ST elevation MI (STEMI) ([place]) 08/26/2011    PAST SURGICAL HISTORY: Past Surgical History:  Procedure Laterality Date  . CARDIAC CATHETERIZATION  2013   @ [place]; [facility] s/p stent   . CORONARY ANGIOPLASTY    . ELECTROMAGNETIC NAVIGATION BROCHOSCOPY N/A 11/27/2016   Procedure: ELECTROMAGNETIC NAVIGATION BRONCHOSCOPY;  Surgeon: [name], MD;  Location: [facility] ORS;  Service: Cardiopulmonary;  Laterality: N/A;    FAMILY HISTORY: Family History  Problem Relation Age of Onset  . Heart attack Mother   .  Heart attack Father     ADVANCED DIRECTIVES (Y/N):  N  HEALTH MAINTENANCE: Social History   Tobacco Use  . Smoking status: Former Smoker    Packs/day: 1.00    Types: Cigarettes    Quit date: 08/27/2011    Years since quitting: 8.7  . Smokeless tobacco: Never  Used  Vaping Use  . Vaping Use: Never used  Substance Use Topics  . Alcohol use: No  . Drug use: No     Colonoscopy:  PAP:  Bone density:  Lipid panel:  Allergies  Allergen Reactions  . Statins Other (See Comments)    Arthralagia    Current Outpatient Medications  Medication Sig Dispense Refill  . amLODipine (NORVASC) 5 MG tablet Take 5 mg by mouth 2 (two) times daily as needed.     Marland Kitchen apixaban (ELIQUIS) 5 MG TABS tablet Take 1 tablet (5 mg total) by mouth 2 (two) times daily. 60 tablet 0  . labetalol (NORMODYNE) 100 MG tablet Take 100 mg by mouth as needed. Only if bp is 150/90 (Patient not taking: Reported on 06/07/2020)    . lisinopril-hydrochlorothiazide (ZESTORETIC) 10-12.5 MG tablet Take 1 tablet by mouth daily.    . metoprolol succinate (TOPROL-XL) 50 MG 24 hr tablet Take 50 mg by mouth daily.    . traMADol (ULTRAM) 50 MG tablet Take 1 tablet (50 mg total) by mouth 2 (two) times daily as needed for moderate pain. (Patient not taking: Reported on 06/07/2020) 60 tablet 0  . amiodarone (PACERONE) 200 MG tablet  (Patient not taking: Reported on 06/07/2020)     No current facility-administered medications for this visit.    OBJECTIVE: Vitals:   06/07/20 0949  BP: (!) 144/79  Pulse: 70  Temp: (!) 95.9 F (35.5 C)     Body mass index is 20.98 kg/m.    ECOG FS:0 - Asymptomatic  General: Well-developed, well-nourished, no acute distress. HEENT: Normocephalic. Neuro: Alert, answering all questions appropriately. Cranial nerves grossly intact. Psych: Normal affect.   LAB RESULTS:  Lab Results  Component Value Date   NA 138 03/15/2020   K 4.2 03/15/2020   CL 98 03/15/2020   CO2 31 03/15/2020   GLUCOSE 109 (H) 03/15/2020   BUN 18 03/15/2020   CREATININE 0.94 03/15/2020   CALCIUM 8.5 (L) 03/15/2020   PROT 6.4 (L) 03/15/2020   ALBUMIN 3.6 03/15/2020   AST 16 03/15/2020   ALT 17 03/15/2020   ALKPHOS 82 03/15/2020   BILITOT 0.6 03/15/2020   GFRNONAA >60 03/15/2020    GFRAA >60 03/15/2020    Lab Results  Component Value Date   WBC 11.2 (H) 03/15/2020   NEUTROABS 8.1 (H) 03/15/2020   HGB 15.9 03/15/2020   HCT 44.9 03/15/2020   MCV 90.2 03/15/2020   PLT 213 03/15/2020     STUDIES: NM PET Image Restag (PS) Skull Base To Thigh  Result Date: 05/30/2020 CLINICAL DATA:  Sub treatment strategy for LEFT lung carcinoma. EXAM: NUCLEAR MEDICINE PET SKULL BASE TO THIGH TECHNIQUE: 7.5 mCi F-18 FDG was injected intravenously. Full-ring PET imaging was performed from the skull base to thigh after the radiotracer. CT data was obtained and used for attenuation correction and anatomic localization. Fasting blood glucose: 110 mg/dl COMPARISON:  PET-CT 01/20/2020 FINDINGS: Mediastinal blood pool activity: SUV max 1.9 Liver activity: SUV max NA NECK: No hypermetabolic lymph nodes in the neck. Incidental CT findings: none CHEST: consolidation in the medial aspect of the LEFT upper lobe is unchanged in size measuring 5.2  x 3.5 cm compared with 5.1 x 3.9 cm on comparison exam remeasured. There is continued moderate metabolic activity within this consolidation with SUV max equal 3.8 compared with SUV max equal 5.4. No hypermetabolic mediastinal lymph nodes. No suspicious pulmonary nodules. Incidental CT findings: none ABDOMEN/PELVIS: No abnormal hypermetabolic activity within the liver, pancreas, adrenal glands, or spleen. No hypermetabolic lymph nodes in the abdomen or pelvis. Incidental CT findings: none SKELETON: Interval enlargement of a sclerotic lesion in the T10 vertebral body measuring 14 mm (image 117/3) compared to 10 mm. Lesion has similar metabolic activity SUV max equal 2.9 compared to SUV max equal 2.7 Sclerotic lesion the posterior RIGHT iliac bone measuring 10 mm (image 193) is increased from 5 mm. This lesion is increased radiotracer activity with SUV max equal 3.3. Incidental CT findings: none IMPRESSION: 1. Mild progression of skeletal metastasis within enlarging  hypermetabolic sclerotic lesions at T10 and in the RIGHT iliac bone. 2. Stable masslike consolidation in the LEFT upper lobe with moderate metabolic activity is favored benign post therapy inflammation. Electronically Signed   By: Suzy Bouchard M.D.   On: 05/30/2020 13:10    ONCOLOGY HISTORY:  Biopsy of lung nodule on November 27, 2016 revealed adenocarcinoma, but there was no invasive component on the sample. Biopsy of mediastinal lymph node had insufficient material. Patient ultimately declined any further biopsies or surgery and elected to proceed with XRT alone which he completed in April 2018.  PET scan on January 22, 2019 reviewed independently with increased metabolic activity prior to previous with SUV increasing from 6.5 up to 8.8.  Patient declined intervention at that time and elected to proceed with simple observation.  Repeat CT scan on April 21, 2019 reviewed independently with progressive 6.4 cm mass in the left upper lobe.  Patient declined systemic treatment, but agreed to proceed with XRT which she completed in approximately August 2020.  CT scan on September 23, 2019 reviewed independently with no obvious evidence of recurrent or progressive disease.   ASSESSMENT:  Adenocarcinoma of upper lobe of left lung   PLAN:   1.  Adenocarcinoma of upper lobe of left lung: PET scan results from May 30, 2020 reviewed independently and report as above with stable left upper lobe lesion with evidence of treatment changes.  Right lower lobe spiculated nodule is not mentioned on this imaging.  T10 vertebral body appears to be worse, but suspect this is residual inflammation/irritation from XRT which was completed in May 2021.  We will continue to monitor right iliac lesion.  No intervention is needed at this time.  Return to clinic in 6 months with repeat PET scan and further evaluation.    2.  Shoulder/back pain: Patient reports this has been evident for years/decades: Continue tramadol as needed.  I  provided 20 minutes of face-to-face video visit time during this encounter which included chart review, counseling, and coordination of care as documented above.  Patient expressed understanding and was in agreement with this plan. He also understands that He can call clinic at any time with any questions, concerns, or complaints.     [name], MD   06/07/2020 10:11 AM

## 2020-06-03 ENCOUNTER — Inpatient Hospital Stay: Payer: Medicare HMO | Admitting: Oncology

## 2020-06-06 ENCOUNTER — Encounter: Payer: Self-pay | Admitting: Oncology

## 2020-06-06 NOTE — Progress Notes (Signed)
Patient was called for pre assessment. Spoke to patient's daughter and she would like to know if patient is able to have radiation treatment on both places and how quickly treatment will last. She would like to know if radiation treatment was applied on pelvis or just hip in the past? Patient is still having pain that is not controlled in shoulder and back would like to discuss with provider.

## 2020-06-07 ENCOUNTER — Other Ambulatory Visit: Payer: Self-pay

## 2020-06-07 ENCOUNTER — Encounter: Payer: Self-pay | Admitting: Radiation Oncology

## 2020-06-07 ENCOUNTER — Ambulatory Visit
Admission: RE | Admit: 2020-06-07 | Discharge: 2020-06-07 | Disposition: A | Discharge status: Home / Self Care | Payer: Medicare HMO | Source: Ambulatory Visit | Attending: Radiation Oncology | Admitting: Radiation Oncology

## 2020-06-07 ENCOUNTER — Inpatient Hospital Stay: Payer: Medicare HMO | Admitting: Oncology

## 2020-06-07 ENCOUNTER — Encounter: Payer: Self-pay | Admitting: Oncology

## 2020-06-07 VITALS — BP 144/79 | HR 70 | Temp 95.9°F | Wt 138.0 lb

## 2020-06-07 VITALS — BP 144/79 | HR 70 | Temp 95.9°F | Wt 135.0 lb

## 2020-06-07 DIAGNOSIS — C3412 Malignant neoplasm of upper lobe, left bronchus or lung: Secondary | ICD-10-CM

## 2020-06-07 DIAGNOSIS — Z87891 Personal history of nicotine dependence: Secondary | ICD-10-CM | POA: Diagnosis not present

## 2020-06-07 DIAGNOSIS — C7951 Secondary malignant neoplasm of bone: Secondary | ICD-10-CM | POA: Diagnosis not present

## 2020-06-07 NOTE — Progress Notes (Signed)
Radiation Oncology Follow up Note  Name: Scott Gross   Date:   06/07/2020 MRN:  [id number] DOB: 1934/03/19    This 86 y.o. male presents to the clinic today for follow-up in patient status post SBRT 1 year prior for stage I adenocarcinoma also now out 40-month status post palliative radiation therapy to T10 vertebral body.  REFERRING PROVIDER: [name], MD  HPI: Patient is an 86 year old male initially treated a year ago with SBRT.  For recurrence of a previously treated left upper lobe stage I adenocarcinoma which was treated over 2 years prior.  He developed bone metastasis with hypermetabolic S28 lesion which was radiated approximately 3 months prior.  He is asymptomatic as far as bone pain is concerned at this time.  Recent PET CT scan which I have reviewed shows mild progression of the T10 lesion in the right iliac bone lesion.  There is masslike consolidation left upper lobe with moderate metabolic activity favoring post therapy inflammation.  Again patient maintains that he is asymptomatic as far as bone pain is concerned.  COMPLICATIONS OF TREATMENT: none  FOLLOW UP COMPLIANCE: keeps appointments   PHYSICAL EXAM:  BP (!) 144/79   Pulse 70   Temp (!) 95.9 F (35.5 C) (Tympanic)   Wt 135 lb (61.2 kg)   BMI 20.53 kg/m  Well-developed well-nourished patient in NAD. HEENT reveals PERLA, EOMI, discs not visualized.  Oral cavity is clear. No oral mucosal lesions are identified. Neck is clear without evidence of cervical or supraclavicular adenopathy. Lungs are clear to A&P. Cardiac examination is essentially unremarkable with regular rate and rhythm without murmur rub or thrill. Abdomen is benign with no organomegaly or masses noted. Motor sensory and DTR levels are equal and symmetric in the upper and lower extremities. Cranial nerves II through XII are grossly intact. Proprioception is intact. No peripheral adenopathy or edema is identified. No motor or sensory levels are noted.  Crude visual fields are within normal range.  RADIOLOGY RESULTS: PET scan reviewed compatible with above-stated findings and compared to previous studies  PLAN: I believe the tech T10 lesion at this time is stable this may be post inflammation from radiation.  Also please what is transpiring in his chest showing really no evidence of disease at this time.  He is seen Dr. Grayland Ormond who continues to observe the patient.  Family has lots of questions about systemic therapy and treating these lesions a PET CT scan which this time I try to explain at some point he may need a systemic treatment or immunotherapy.  I have asked to see him back in 6 months for follow-up.  Patient knows to call with any concerns.  I would like to take this opportunity to thank you for allowing me to participate in the care of your patient.Noreene Filbert, MD

## 2020-06-29 ENCOUNTER — Other Ambulatory Visit: Payer: Self-pay | Admitting: Radiology

## 2020-06-29 ENCOUNTER — Other Ambulatory Visit: Payer: Medicare HMO

## 2020-06-29 DIAGNOSIS — Z20822 Contact with and (suspected) exposure to covid-19: Secondary | ICD-10-CM

## 2020-07-01 LAB — SARS-COV-2, NAA 2 DAY TAT

## 2020-07-01 LAB — NOVEL CORONAVIRUS, NAA: SARS-CoV-2, NAA: NOT DETECTED

## 2020-08-08 ENCOUNTER — Ambulatory Visit: Payer: Medicare HMO | Admitting: Radiation Oncology

## 2020-09-05 DIAGNOSIS — H2513 Age-related nuclear cataract, bilateral: Secondary | ICD-10-CM | POA: Diagnosis not present

## 2020-09-05 DIAGNOSIS — H35361 Drusen (degenerative) of macula, right eye: Secondary | ICD-10-CM | POA: Diagnosis not present

## 2020-09-05 DIAGNOSIS — H524 Presbyopia: Secondary | ICD-10-CM | POA: Diagnosis not present

## 2020-09-05 DIAGNOSIS — H25013 Cortical age-related cataract, bilateral: Secondary | ICD-10-CM | POA: Diagnosis not present

## 2020-09-13 DIAGNOSIS — H2513 Age-related nuclear cataract, bilateral: Secondary | ICD-10-CM | POA: Diagnosis not present

## 2020-09-13 DIAGNOSIS — H2512 Age-related nuclear cataract, left eye: Secondary | ICD-10-CM | POA: Diagnosis not present

## 2020-09-13 DIAGNOSIS — H25013 Cortical age-related cataract, bilateral: Secondary | ICD-10-CM | POA: Diagnosis not present

## 2020-09-21 DIAGNOSIS — H2512 Age-related nuclear cataract, left eye: Secondary | ICD-10-CM | POA: Diagnosis not present

## 2020-09-21 DIAGNOSIS — H25812 Combined forms of age-related cataract, left eye: Secondary | ICD-10-CM | POA: Diagnosis not present

## 2020-09-23 DIAGNOSIS — E119 Type 2 diabetes mellitus without complications: Secondary | ICD-10-CM | POA: Diagnosis not present

## 2020-09-26 ENCOUNTER — Other Ambulatory Visit: Payer: Medicare HMO

## 2020-09-26 DIAGNOSIS — Z20822 Contact with and (suspected) exposure to covid-19: Secondary | ICD-10-CM | POA: Diagnosis not present

## 2020-09-28 DIAGNOSIS — I2510 Atherosclerotic heart disease of native coronary artery without angina pectoris: Secondary | ICD-10-CM | POA: Diagnosis not present

## 2020-09-28 DIAGNOSIS — E785 Hyperlipidemia, unspecified: Secondary | ICD-10-CM | POA: Diagnosis not present

## 2020-09-28 DIAGNOSIS — Z125 Encounter for screening for malignant neoplasm of prostate: Secondary | ICD-10-CM | POA: Diagnosis not present

## 2020-09-28 DIAGNOSIS — I10 Essential (primary) hypertension: Secondary | ICD-10-CM | POA: Diagnosis not present

## 2020-09-28 DIAGNOSIS — E119 Type 2 diabetes mellitus without complications: Secondary | ICD-10-CM | POA: Diagnosis not present

## 2020-09-28 LAB — SARS-COV-2, NAA 2 DAY TAT

## 2020-09-28 LAB — NOVEL CORONAVIRUS, NAA: SARS-CoV-2, NAA: NOT DETECTED

## 2020-10-03 ENCOUNTER — Telehealth: Payer: Self-pay | Admitting: *Deleted

## 2020-10-03 ENCOUNTER — Telehealth: Payer: Self-pay | Admitting: Oncology

## 2020-10-03 NOTE — Telephone Encounter (Signed)
Sonia Baller will cal pt.

## 2020-10-03 NOTE — Telephone Encounter (Signed)
Daughter called requesting appointment for patient to be assessed as he has been sick for the past week and a half. He was tested for COVID and is negative. His symptoms started out as a cold, but he is now weak and vomiting and his breath sounds have worsened.Please advise

## 2020-10-03 NOTE — Telephone Encounter (Signed)
Re: Feeling bad  Symptom onset- 1.5-2 weeks  Tested for covid- Negative  N/V Saturday  Has been drinking pedialyte and gatorade.   VSS.  128/84 HR 60's 02 sat- 96%  Significant fatigue  Recommend-  Stat Chest X-ray  [facility] appt  Labs  Possible IV lfuids  Faythe Casa, NP 10/03/2020 4:07 PM

## 2020-10-03 NOTE — Telephone Encounter (Signed)
Called and spoke to daughter. He is scheduled for tomorrow.   Faythe Casa, NP 10/03/2020 4:09 PM

## 2020-10-04 ENCOUNTER — Other Ambulatory Visit: Payer: Self-pay | Admitting: Oncology

## 2020-10-04 ENCOUNTER — Ambulatory Visit
Admission: RE | Admit: 2020-10-04 | Discharge: 2020-10-04 | Disposition: A | Discharge status: Home / Self Care | Payer: Medicare HMO | Source: Ambulatory Visit | Attending: Oncology | Admitting: Oncology

## 2020-10-04 ENCOUNTER — Ambulatory Visit
Admission: RE | Admit: 2020-10-04 | Discharge: 2020-10-04 | Disposition: A | Discharge status: Home / Self Care | Payer: Medicare HMO | Attending: Oncology | Admitting: Oncology

## 2020-10-04 ENCOUNTER — Inpatient Hospital Stay: Payer: Medicare HMO

## 2020-10-04 ENCOUNTER — Inpatient Hospital Stay: Payer: Medicare HMO | Attending: Oncology

## 2020-10-04 ENCOUNTER — Other Ambulatory Visit: Payer: Self-pay

## 2020-10-04 ENCOUNTER — Inpatient Hospital Stay (HOSPITAL_BASED_OUTPATIENT_CLINIC_OR_DEPARTMENT_OTHER): Payer: Medicare HMO | Admitting: Oncology

## 2020-10-04 ENCOUNTER — Other Ambulatory Visit (HOSPITAL_COMMUNITY): Payer: Self-pay | Admitting: Lab

## 2020-10-04 VITALS — BP 133/85 | HR 87 | Temp 97.3°F | Resp 18

## 2020-10-04 DIAGNOSIS — I951 Orthostatic hypotension: Secondary | ICD-10-CM

## 2020-10-04 DIAGNOSIS — J90 Pleural effusion, not elsewhere classified: Secondary | ICD-10-CM | POA: Diagnosis not present

## 2020-10-04 DIAGNOSIS — R531 Weakness: Secondary | ICD-10-CM

## 2020-10-04 DIAGNOSIS — E860 Dehydration: Secondary | ICD-10-CM

## 2020-10-04 DIAGNOSIS — R062 Wheezing: Secondary | ICD-10-CM

## 2020-10-04 DIAGNOSIS — C3492 Malignant neoplasm of unspecified part of left bronchus or lung: Secondary | ICD-10-CM | POA: Insufficient documentation

## 2020-10-04 DIAGNOSIS — C3412 Malignant neoplasm of upper lobe, left bronchus or lung: Secondary | ICD-10-CM

## 2020-10-04 DIAGNOSIS — R112 Nausea with vomiting, unspecified: Secondary | ICD-10-CM

## 2020-10-04 DIAGNOSIS — C3490 Malignant neoplasm of unspecified part of unspecified bronchus or lung: Secondary | ICD-10-CM | POA: Diagnosis not present

## 2020-10-04 LAB — COMPREHENSIVE METABOLIC PANEL
ALT: 12 U/L (ref 0–44)
AST: 19 U/L (ref 15–41)
Albumin: 4 g/dL (ref 3.5–5.0)
Alkaline Phosphatase: 69 U/L (ref 38–126)
Anion gap: 12 (ref 5–15)
BUN: 10 mg/dL (ref 8–23)
CO2: 30 mmol/L (ref 22–32)
Calcium: 9 mg/dL (ref 8.9–10.3)
Chloride: 91 mmol/L — ABNORMAL LOW (ref 98–111)
Creatinine, Ser: 0.91 mg/dL (ref 0.61–1.24)
GFR, Estimated: >60 mL/min (ref >60)
Glucose, Bld: 133 mg/dL — ABNORMAL HIGH (ref 70–99)
Potassium: 3.5 mmol/L (ref 3.5–5.1)
Sodium: 133 mmol/L — ABNORMAL LOW (ref 135–145)
Total Bilirubin: 1.1 mg/dL (ref 0.3–1.2)
Total Protein: 7.2 g/dL (ref 6.5–8.1)

## 2020-10-04 LAB — CBC WITH DIFFERENTIAL/PLATELET
Abs Immature Granulocytes: 0.06 10*3/uL (ref 0.00–0.07)
Basophils Absolute: 0.1 10*3/uL (ref 0.0–0.1)
Basophils Relative: 1 %
Eosinophils Absolute: 0.2 10*3/uL (ref 0.0–0.5)
Eosinophils Relative: 2 %
HCT: 45.6 % (ref 39.0–52.0)
Hemoglobin: 16.4 g/dL (ref 13.0–17.0)
Immature Granulocytes: 1 %
Lymphocytes Relative: 16 %
Lymphs Abs: 1.7 10*3/uL (ref 0.7–4.0)
MCH: 31.2 pg (ref 26.0–34.0)
MCHC: 36 g/dL (ref 30.0–36.0)
MCV: 86.7 fL (ref 80.0–100.0)
Monocytes Absolute: 0.8 10*3/uL (ref 0.1–1.0)
Monocytes Relative: 8 %
Neutro Abs: 7.4 10*3/uL (ref 1.7–7.7)
Neutrophils Relative %: 72 %
Platelets: 281 10*3/uL (ref 150–400)
RBC: 5.26 MIL/uL (ref 4.22–5.81)
RDW: 12 % (ref 11.5–15.5)
WBC: 10.2 10*3/uL (ref 4.0–10.5)
nRBC: 0 % (ref 0.0–0.2)

## 2020-10-04 LAB — URINALYSIS, COMPLETE (UACMP) WITH MICROSCOPIC
Bacteria, UA: NONE SEEN
Bilirubin Urine: NEGATIVE
Glucose, UA: NEGATIVE mg/dL
Hgb urine dipstick: NEGATIVE
Ketones, ur: NEGATIVE mg/dL
Leukocytes,Ua: NEGATIVE
Nitrite: NEGATIVE
Protein, ur: NEGATIVE mg/dL
Specific Gravity, Urine: 1.003 — ABNORMAL LOW (ref 1.005–1.030)
Squamous Epithelial / HPF: NONE SEEN (ref 0–5)
pH: 7 (ref 5.0–8.0)

## 2020-10-04 LAB — MAGNESIUM: Magnesium: 2.1 mg/dL (ref 1.7–2.4)

## 2020-10-04 MED ORDER — SODIUM CHLORIDE 0.9 % IV SOLN
10.0000 mg | Freq: Once | INTRAVENOUS | Status: AC
  Administered 2020-10-04: 11:00:00 10 mg via INTRAVENOUS
  Filled 2020-10-04: qty 10

## 2020-10-04 MED ORDER — METHYLPREDNISOLONE 4 MG PO TBPK
ORAL_TABLET | ORAL | 0 refills | Status: DC
Start: 2020-10-04 — End: 2020-12-08

## 2020-10-04 MED ORDER — DEXAMETHASONE SODIUM PHOSPHATE 10 MG/ML IJ SOLN: 10.0000 mg | Freq: Once | INTRAMUSCULAR | Status: DC

## 2020-10-04 MED ORDER — SODIUM CHLORIDE 0.9 % IV SOLN
INTRAVENOUS | Status: DC
  Filled 2020-10-04 (×2): qty 250

## 2020-10-04 NOTE — Progress Notes (Signed)
Labs ordered for [facility] visit today.   Faythe Casa, NP 10/04/2020 9:02 AM

## 2020-10-04 NOTE — Patient Instructions (Signed)
Today while you are in clinic he received half a liter of normal saline along with 10 mg dexamethasone which is a steroid.  I have sent a prescription in for additional steroids for the next 6 days.  This will likely help with your appetite, generalized weakness and your wheezing.  I also placed orders for you to have a chest x-ray done.  If you are able had over to the outpatient imaging center off of [address] and have your chest x-ray done.  I will call you with the results from that.  Regarding your nausea and vomiting, continue taking Zofran as needed.  If this worsens or you develop additional neurological symptoms please let us know.  Dr. Grayland Ormond would like to hold off on the brain MRI at this time.  Overall, we are thinking this may be a viral illness he has had for couple weeks now.  If the chest x-ray shows evidence of possible pneumonia, I will call him in a prescription for antibiotics.  Please let me know if you have any additional questions or concerns.  Faythe Casa, NP 10/04/2020 11:18 AM

## 2020-10-04 NOTE — Progress Notes (Addendum)
Symptom Management Consult note [facility]  Telephone:([phone] [phone]  Patient Care Team: Scott Pink, MD as PCP - General (Family Medicine) Scott Huger, MD as Consulting Physician (Oncology)   Name of the patient: Scott Gross  [id number]  [birth year]   Date of visit: 10/04/2020   Diagnosis-lung cancer  Chief complaint/ Reason for visit-generalized weakness and cough  Heme/Onc history:  Oncology History   No history exists.   Interval history-[name] is a 86 year old male with past medical history significant for CAD, hypertension, MI, atrial fibrillation with RVR, type 2 diabetes, hyperlipidemia and stage I left lung cancer s/p  SBRT for stage I adenocarcinoma of the lung and status post salvage radiation therapy to left lung area about 1 year ago who recently completed radiation to T10 for pain control.  He was last seen in clinic by Dr. Grayland Gross on 06/07/2020 to review imaging.  PET scan from 05/30/2020 showed mild progression of skeletal metastasis with enlarging hypermetabolic sclerotic lesion at T10 in the right iliac bone.  Stable masslike consolidation in the left upper lobe with moderate metabolic activity which is favored to be a benign post therapy inflammation.  He was scheduled for follow-up in 6 months.  In the interim he has done well.  He had cataract surgery earlier this month on his left eye.  He notes significant improvement of his vision.  He will be having his right eye done next month.   Patient's daughter Scott Gross called clinic yesterday reporting a slow decline of her father.  She describes significant fatigue and weakness that has been progressive over the past 2 weeks.  More recently he has had significant nausea and vomiting with dry heaving.  She has been trying to keep him hydrated with Gatorade and Pedialyte.  His vital signs have been stable and O2 saturations have been around 96%.  He has an  intermittent cough so she had them tested for COVID-19 which was negative.  He has some audible wheezing with inspiration and expiration.  They have been using Zofran for his nausea with some relief.  He reports no nausea or vomiting for the past 2 days.  He has no appetite and eats very little.  They deny any fevers or neurological complaints.  His appetite has been minimal.  He denies any chest pain, constipation or diarrhea.   ECOG FS:2 - Symptomatic, <50% confined to bed  Review of systems- Review of Systems  Constitutional: Positive for malaise/fatigue and weight loss. Negative for chills and fever.  HENT: Negative for congestion, ear pain and tinnitus.   Eyes: Negative.  Negative for blurred vision and double vision.  Respiratory: Positive for wheezing. Negative for cough, sputum production and shortness of breath.   Cardiovascular: Negative.  Negative for chest pain, palpitations and leg swelling.  Gastrointestinal: Positive for nausea and vomiting. Negative for abdominal pain, constipation and diarrhea.  Genitourinary: Negative for dysuria, frequency and urgency.  Musculoskeletal: Negative for back pain and falls.  Skin: Negative.  Negative for rash.  Neurological: Positive for weakness. Negative for headaches.  Endo/Heme/Allergies: Negative.  Does not bruise/bleed easily.  Psychiatric/Behavioral: Negative.  Negative for depression. The patient is not nervous/anxious and does not have insomnia.      Current treatment-status post radiation-final treatment was on 03/02/2020  Allergies  Allergen Reactions  . Statins Other (See Comments)    Arthralagia     Past Medical History:  Diagnosis Date  . Arthritis   . CAD (  coronary artery disease) Nov. 12, 2012   Inferior ST elevation MI in November of 2012 with ventricular fibrillation arrest. Status post drug-eluting stent placement to the mid LAD. Most recent cardiac catheterization in June of this year showed patent stent with  minimal restenosis, 75% proximal LAD and 80% distal LAD which were unchanged from before. Mild disease in the left circumflex and moderate RCA disease. Ejection fraction was 55%.  . Cancer of upper lobe of left lung ([place]) 11/2016   Rad tx's.  . Cardiac arrest [facility]) Nov. 2012   x 2   . Cardiac arrest - ventricular fibrillation   . HOH (hard of hearing)    Left Hearing Aid  . Hyperlipidemia   . Hypertension   . Post PTCA 2013   x2  . ST elevation MI (STEMI) ([place]) 08/26/2011     Past Surgical History:  Procedure Laterality Date  . CARDIAC CATHETERIZATION  2013   @ [place]; [facility] s/p stent   . CORONARY ANGIOPLASTY    . ELECTROMAGNETIC NAVIGATION BROCHOSCOPY N/A 11/27/2016   Procedure: ELECTROMAGNETIC NAVIGATION BRONCHOSCOPY;  Surgeon: Scott Lipps, MD;  Location: [facility] ORS;  Service: Cardiopulmonary;  Laterality: N/A;    Social History   Socioeconomic History  . Marital status: Married    Spouse name: Not on file  . Number of children: Not on file  . Years of education: Not on file  . Highest education level: Not on file  Occupational History  . Not on file  Tobacco Use  . Smoking status: Former Smoker    Packs/day: 1.00    Types: Cigarettes    Quit date: 08/27/2011    Years since quitting: 9.1  . Smokeless tobacco: Never Used  Vaping Use  . Vaping Use: Never used  Substance and Sexual Activity  . Alcohol use: No  . Drug use: No  . Sexual activity: Not Currently  Other Topics Concern  . Not on file  Social History Narrative  . Not on file   Social Determinants of Health   Financial Resource Strain: Not on file  Food Insecurity: Not on file  Transportation Needs: Not on file  Physical Activity: Not on file  Stress: Not on file  Social Connections: Not on file  Intimate Partner Violence: Not on file    Family History  Problem Relation Age of Onset  . Heart attack Mother   . Heart attack Father      Current Outpatient Medications:  .  amiodarone  (PACERONE) 200 MG tablet, , Disp: , Rfl:  .  amLODipine (NORVASC) 5 MG tablet, Take 5 mg by mouth 2 (two) times daily as needed. , Disp: , Rfl:  .  apixaban (ELIQUIS) 5 MG TABS tablet, Take 1 tablet (5 mg total) by mouth 2 (two) times daily., Disp: 60 tablet, Rfl: 0 .  labetalol (NORMODYNE) 100 MG tablet, Take 100 mg by mouth as needed. Only if bp is 150/90 (Patient not taking: Reported on 06/07/2020), Disp: , Rfl:  .  lisinopril-hydrochlorothiazide (ZESTORETIC) 10-12.5 MG tablet, Take 1 tablet by mouth daily., Disp: , Rfl:  .  methylPREDNISolone (MEDROL DOSEPAK) 4 MG TBPK tablet, Take as directed., Disp: 21 tablet, Rfl: 0 .  metoprolol succinate (TOPROL-XL) 50 MG 24 hr tablet, Take 50 mg by mouth daily., Disp: , Rfl:  .  traMADol (ULTRAM) 50 MG tablet, Take 1 tablet (50 mg total) by mouth 2 (two) times daily as needed for moderate pain. (Patient not taking: Reported on 06/07/2020), Disp: 60 tablet, Rfl:  0 No current facility-administered medications for this visit.  Facility-Administered Medications Ordered in Other Visits:  .  0.9 %  sodium chloride infusion, , Intravenous, Continuous, Jyra Lagares, Wandra Feinstein, NP, Last Rate: 999 mL/hr at 10/04/20 1111, New Bag at 10/04/20 1111 .  dexamethasone (DECADRON) 10 mg in sodium chloride 0.9 % 50 mL IVPB, 10 mg, Intravenous, Once, Jacquelin Hawking, NP  Physical exam:  Vitals:   10/04/20 1016  BP: 133/85  Pulse: 87  Resp: 18  Temp: (!) 97.3 F (36.3 C)  TempSrc: Tympanic  SpO2: 100%   Physical Exam Constitutional:      General: Vital signs are normal.     Appearance: Normal appearance.  HENT:     Head: Normocephalic and atraumatic.  Eyes:     Pupils: Pupils are equal, round, and reactive to light.  Cardiovascular:     Rate and Rhythm: Normal rate and regular rhythm.     Heart sounds: Normal heart sounds. No murmur heard.   Pulmonary:     Effort: Pulmonary effort is normal.     Breath sounds: Normal breath sounds. No wheezing.  Abdominal:      General: Bowel sounds are normal. There is no distension.     Palpations: Abdomen is soft.     Tenderness: There is no abdominal tenderness.  Musculoskeletal:        General: No edema. Normal range of motion.     Cervical back: Normal range of motion.  Skin:    General: Skin is warm and dry.     Findings: No rash.  Neurological:     Mental Status: He is alert and oriented to person, place, and time.  Psychiatric:        Judgment: Judgment normal.      CMP Latest Ref Rng & Units 10/04/2020  Glucose 70 - 99 mg/dL 133(H)  BUN 8 - 23 mg/dL 10  Creatinine 0.61 - 1.24 mg/dL 0.91  Sodium 135 - 145 mmol/L 133(L)  Potassium 3.5 - 5.1 mmol/L 3.5  Chloride 98 - 111 mmol/L 91(L)  CO2 22 - 32 mmol/L 30  Calcium 8.9 - 10.3 mg/dL 9.0  Total Protein 6.5 - 8.1 g/dL 7.2  Total Bilirubin 0.3 - 1.2 mg/dL 1.1  Alkaline Phos 38 - 126 U/L 69  AST 15 - 41 U/L 19  ALT 0 - 44 U/L 12   CBC Latest Ref Rng & Units 10/04/2020  WBC 4.0 - 10.5 K/uL 10.2  Hemoglobin 13.0 - 17.0 g/dL 16.4  Hematocrit 39.0 - 52.0 % 45.6  Platelets 150 - 400 K/uL 281    No images are attached to the encounter.  No results found.   Assessment and plan- Patient is a 86 y.o. male who presents to symptom management for generalized weakness, dehydration and nausea and vomiting.  Stage IV lung cancer with mets to the bone: -Status post SBRT and radiation to thoracic spine -Most recent PET scan from August 2021 shows slight progression in bone metastasis but stable lung mass. -He is currently on surveillance.  Weakness: -Secondary to dehydration, nausea and vomiting. -Labs from today show a sodium level of 133 and a normal CBC.  Magnesium level is 2.1. -Plan to give half a liter of normal saline along with 10 mg of dexamethasone. -We will prescribe steroid taper for the next 6 days to see if this improves his weakness, appetite and wheezing. -Patient is asking for additional Ensure hydration packets.  Packets  provided.  Wheezing LLL: -Get chest x-ray  to rule out infection versus inflammation such as COPD or pneumonia. -Less likely infection given white count is stable. -Plan to prescribe steroid taper.  Nausea/vomiting: -Viral versus progression of disease. -Spoke with Dr. Grayland Gross who recommended holding off on brain MRI at this time. -If symptoms persist or worsen, we will schedule an MRI. He declines any additional neurological symptoms such as dizziness, headaches or vision changes.  Disposition: -If symptoms worsen or fail to improve, patient to call clinic for additional work-up. -She is scheduled for repeat PET scan in February 2022. -Keep appointment as scheduled.  Addendum: Patient's daughter Scott Gross notified of chest x-ray and urinalysis.  They both were negative.  Patient and/or family to call if symptoms worsen or fail to resolve with steroids.  Visit Diagnosis 1. Cancer of upper lobe of left lung (HCC)   2. Dehydration   3. Weakness     Patient expressed understanding and was in agreement with this plan. He also understands that He can call clinic at any time with any questions, concerns, or complaints.   Greater than 50% was spent in counseling and coordination of care with this patient including but not limited to discussion of the relevant topics above (See A&P) including, but not limited to diagnosis and management of acute and chronic medical conditions.   Thank you for allowing me to participate in the care of this very pleasant patient.    Jacquelin Hawking, NP [place] [facility] Cell - [phone] Pager- [phone] 10/04/2020 11:18 AM

## 2020-10-04 NOTE — Progress Notes (Signed)
Patient tolerated infusion well, no concerns voiced. Patient discharged. Urine collected. All questions answered. Stable.

## 2020-10-05 LAB — URINE CULTURE: Culture: NO GROWTH

## 2020-10-10 DIAGNOSIS — I739 Peripheral vascular disease, unspecified: Secondary | ICD-10-CM | POA: Diagnosis not present

## 2020-10-10 DIAGNOSIS — Z23 Encounter for immunization: Secondary | ICD-10-CM | POA: Diagnosis not present

## 2020-10-10 DIAGNOSIS — R55 Syncope and collapse: Secondary | ICD-10-CM | POA: Diagnosis not present

## 2020-10-10 DIAGNOSIS — I701 Atherosclerosis of renal artery: Secondary | ICD-10-CM | POA: Diagnosis not present

## 2020-10-10 DIAGNOSIS — I10 Essential (primary) hypertension: Secondary | ICD-10-CM | POA: Diagnosis not present

## 2020-10-10 DIAGNOSIS — I208 Other forms of angina pectoris: Secondary | ICD-10-CM | POA: Diagnosis not present

## 2020-10-10 DIAGNOSIS — E119 Type 2 diabetes mellitus without complications: Secondary | ICD-10-CM | POA: Diagnosis not present

## 2020-10-10 DIAGNOSIS — I480 Paroxysmal atrial fibrillation: Secondary | ICD-10-CM | POA: Diagnosis not present

## 2020-10-10 DIAGNOSIS — C3412 Malignant neoplasm of upper lobe, left bronchus or lung: Secondary | ICD-10-CM | POA: Diagnosis not present

## 2020-10-18 DIAGNOSIS — H2511 Age-related nuclear cataract, right eye: Secondary | ICD-10-CM | POA: Diagnosis not present

## 2020-10-18 DIAGNOSIS — H25011 Cortical age-related cataract, right eye: Secondary | ICD-10-CM | POA: Diagnosis not present

## 2020-10-28 ENCOUNTER — Other Ambulatory Visit: Payer: Medicare HMO

## 2020-10-28 DIAGNOSIS — Z20822 Contact with and (suspected) exposure to covid-19: Secondary | ICD-10-CM | POA: Diagnosis not present

## 2020-11-01 LAB — NOVEL CORONAVIRUS, NAA: SARS-CoV-2, NAA: NOT DETECTED

## 2020-11-02 DIAGNOSIS — H2511 Age-related nuclear cataract, right eye: Secondary | ICD-10-CM | POA: Diagnosis not present

## 2020-11-02 DIAGNOSIS — H25011 Cortical age-related cataract, right eye: Secondary | ICD-10-CM | POA: Diagnosis not present

## 2020-11-10 DIAGNOSIS — C3412 Malignant neoplasm of upper lobe, left bronchus or lung: Secondary | ICD-10-CM | POA: Diagnosis not present

## 2020-11-10 DIAGNOSIS — E119 Type 2 diabetes mellitus without complications: Secondary | ICD-10-CM | POA: Diagnosis not present

## 2020-11-10 DIAGNOSIS — I10 Essential (primary) hypertension: Secondary | ICD-10-CM | POA: Diagnosis not present

## 2020-11-10 DIAGNOSIS — R55 Syncope and collapse: Secondary | ICD-10-CM | POA: Diagnosis not present

## 2020-11-10 DIAGNOSIS — I480 Paroxysmal atrial fibrillation: Secondary | ICD-10-CM | POA: Diagnosis not present

## 2020-11-10 DIAGNOSIS — I701 Atherosclerosis of renal artery: Secondary | ICD-10-CM | POA: Diagnosis not present

## 2020-11-10 DIAGNOSIS — I208 Other forms of angina pectoris: Secondary | ICD-10-CM | POA: Diagnosis not present

## 2020-11-10 DIAGNOSIS — R0602 Shortness of breath: Secondary | ICD-10-CM | POA: Diagnosis not present

## 2020-11-10 DIAGNOSIS — I739 Peripheral vascular disease, unspecified: Secondary | ICD-10-CM | POA: Diagnosis not present

## 2020-12-01 ENCOUNTER — Other Ambulatory Visit: Payer: Self-pay

## 2020-12-01 ENCOUNTER — Encounter
Admission: RE | Admit: 2020-12-01 | Discharge: 2020-12-01 | Disposition: A | Discharge status: Home / Self Care | Payer: Medicare HMO | Source: Ambulatory Visit | Attending: Oncology | Admitting: Oncology

## 2020-12-01 DIAGNOSIS — C3412 Malignant neoplasm of upper lobe, left bronchus or lung: Secondary | ICD-10-CM | POA: Insufficient documentation

## 2020-12-01 DIAGNOSIS — R918 Other nonspecific abnormal finding of lung field: Secondary | ICD-10-CM | POA: Diagnosis not present

## 2020-12-01 DIAGNOSIS — C7951 Secondary malignant neoplasm of bone: Secondary | ICD-10-CM | POA: Insufficient documentation

## 2020-12-01 DIAGNOSIS — I700 Atherosclerosis of aorta: Secondary | ICD-10-CM | POA: Diagnosis not present

## 2020-12-01 DIAGNOSIS — C3490 Malignant neoplasm of unspecified part of unspecified bronchus or lung: Secondary | ICD-10-CM | POA: Diagnosis not present

## 2020-12-01 LAB — GLUCOSE, CAPILLARY: Glucose-Capillary: 118 mg/dL — ABNORMAL HIGH (ref 70–99)

## 2020-12-01 MED ORDER — FLUDEOXYGLUCOSE F - 18 (FDG) INJECTION
7.2000 | Freq: Once | INTRAVENOUS | Status: AC | PRN
  Administered 2020-12-01: 7.2 via INTRAVENOUS

## 2020-12-04 NOTE — Progress Notes (Signed)
[place]  [phone] [phone]  ID: Scott Gross OB: [birth year]  MR#: [id number]  BUL#:[id number]  Patient Care Team: [name], MD as PCP - General (Family Medicine) [name], MD as Consulting Physician (Oncology)   CHIEF COMPLAINT: Adenocarcinoma of upper lobe of left lung.  INTERVAL HISTORY: Patient returns to clinic today for further evaluation and discussion of his PET scan results.  He currently feels well and is asymptomatic.  He does not complain of any weakness or fatigue.  He denies pain.  He has a good appetite and denies weight loss.  He has no neurologic complaints. He denies any chest pain, cough, hemoptysis, or shortness of breath. He has no nausea, vomiting, constipation, or diarrhea. He has no urinary complaints.  Patient offers no specific complaints today.  REVIEW OF SYSTEMS:   Review of Systems  Constitutional: Negative.  Negative for fever, malaise/fatigue and weight loss.  Respiratory: Negative.  Negative for cough, hemoptysis and shortness of breath.   Cardiovascular: Negative.  Negative for chest pain and leg swelling.  Gastrointestinal: Negative.  Negative for abdominal pain.  Genitourinary: Negative.  Negative for dysuria.  Musculoskeletal: Negative.  Negative for back pain and joint pain.  Skin: Negative.  Negative for rash.  Neurological: Negative.  Negative for sensory change, focal weakness, weakness and headaches.  Psychiatric/Behavioral: Negative.  The patient is not nervous/anxious.     As per HPI. Otherwise, a complete review of systems is negative.  PAST MEDICAL HISTORY: Past Medical History:  Diagnosis Date  . Arthritis   . CAD (coronary artery disease) Nov. 12, 2012   Inferior ST elevation MI in November of 2012 with ventricular fibrillation arrest. Status post drug-eluting stent placement to the mid LAD. Most recent cardiac catheterization in June of this year showed patent stent  with minimal restenosis, 75% proximal LAD and 80% distal LAD which were unchanged from before. Mild disease in the left circumflex and moderate RCA disease. Ejection fraction was 55%.  . Cancer of upper lobe of left lung ([place]) 11/2016   Rad tx's.  . Cardiac arrest [facility]) Nov. 2012   x 2   . Cardiac arrest - ventricular fibrillation   . HOH (hard of hearing)    Left Hearing Aid  . Hyperlipidemia   . Hypertension   . Post PTCA 2013   x2  . ST elevation MI (STEMI) ([place]) 08/26/2011    PAST SURGICAL HISTORY: Past Surgical History:  Procedure Laterality Date  . CARDIAC CATHETERIZATION  2013   @ [place]; [facility] s/p stent   . CORONARY ANGIOPLASTY    . ELECTROMAGNETIC NAVIGATION BROCHOSCOPY N/A 11/27/2016   Procedure: ELECTROMAGNETIC NAVIGATION BRONCHOSCOPY;  Surgeon: [name], MD;  Location: [facility] ORS;  Service: Cardiopulmonary;  Laterality: N/A;    FAMILY HISTORY: Family History  Problem Relation Age of Onset  . Heart attack Mother   . Heart attack Father     ADVANCED DIRECTIVES (Y/N):  N  HEALTH MAINTENANCE: Social History   Tobacco Use  . Smoking status: Former Smoker    Packs/day: 1.00    Types: Cigarettes    Quit date: 08/27/2011    Years since quitting: 9.2  . Smokeless tobacco: Never Used  Vaping Use  . Vaping Use: Never used  Substance Use Topics  . Alcohol use: No  . Drug use: No     Colonoscopy:  PAP:  Bone density:  Lipid panel:  Allergies  Allergen Reactions  . Statins Other (See Comments)  Arthralagia    Current Outpatient Medications  Medication Sig Dispense Refill  . amLODipine (NORVASC) 5 MG tablet Take 5 mg by mouth 2 (two) times daily as needed.     Marland Kitchen losartan-hydrochlorothiazide (HYZAAR) 50-12.5 MG tablet Take 1 tablet by mouth daily.    . metoprolol succinate (TOPROL-XL) 50 MG 24 hr tablet Take 50 mg by mouth daily.     No current facility-administered medications for this visit.    OBJECTIVE: Vitals:   12/08/20 1122  BP: 107/73   Pulse: 83  Resp: 16  Temp: 97.6 F (36.4 C)  SpO2: 98%     Body mass index is 19.92 kg/m.    ECOG FS:0 - Asymptomatic  General: Well-developed, well-nourished, no acute distress. Eyes: Pink conjunctiva, anicteric sclera. HEENT: Normocephalic, moist mucous membranes. Lungs: No audible wheezing or coughing. Heart: Regular rate and rhythm. Abdomen: Soft, nontender, no obvious distention. Musculoskeletal: No edema, cyanosis, or clubbing. Neuro: Alert, answering all questions appropriately. Cranial nerves grossly intact. Skin: No rashes or petechiae noted. Psych: Normal affect.   LAB RESULTS:  Lab Results  Component Value Date   NA 133 (L) 10/04/2020   K 3.5 10/04/2020   CL 91 (L) 10/04/2020   CO2 30 10/04/2020   GLUCOSE 133 (H) 10/04/2020   BUN 10 10/04/2020   CREATININE 0.91 10/04/2020   CALCIUM 9.0 10/04/2020   PROT 7.2 10/04/2020   ALBUMIN 4.0 10/04/2020   AST 19 10/04/2020   ALT 12 10/04/2020   ALKPHOS 69 10/04/2020   BILITOT 1.1 10/04/2020   GFRNONAA >60 10/04/2020   GFRAA >60 03/15/2020    Lab Results  Component Value Date   WBC 10.2 10/04/2020   NEUTROABS 7.4 10/04/2020   HGB 16.4 10/04/2020   HCT 45.6 10/04/2020   MCV 86.7 10/04/2020   PLT 281 10/04/2020     STUDIES: NM PET Image Restag (PS) Skull Base To Thigh  Result Date: 12/01/2020 CLINICAL DATA:  Subsequent treatment strategy for lung cancer. EXAM: NUCLEAR MEDICINE PET SKULL BASE TO THIGH TECHNIQUE: 7.5 mCi F-18 FDG was injected intravenously. Full-ring PET imaging was performed from the skull base to thigh after the radiotracer. CT data was obtained and used for attenuation correction and anatomic localization. Fasting blood glucose: 118 mg/dl COMPARISON:  05/30/2020 FINDINGS: Mediastinal blood pool activity: SUV max 2.7 Liver activity: SUV max NA NECK: No hypermetabolic lymph nodes in the neck. Incidental CT findings: none CHEST: Pleural thickening in the medial left apex extending down to a region  of consolidative soft tissue in the medial left upper lobe along the aorta is stable in the interval. As before there is low level FDG uptake with SUV max = 3.6 today compared to 3.8 previously. Measuring at the same level as on the prior study, the lesion measures 5.4 x 2.8 cm today compared to 5.2 x 3.5 cm previously. No new suspicious soft tissue hypermetabolism in the chest on today's exam. Incidental CT findings: Coronary artery calcification is evident. Atherosclerotic calcification is noted in the wall of the thoracic aorta. Centrilobular emphsyema noted. ABDOMEN/PELVIS: No abnormal hypermetabolic activity within the liver, pancreas, adrenal glands, or spleen. No hypermetabolic lymph nodes in the abdomen or pelvis. Incidental CT findings: There is abdominal aortic atherosclerosis without aneurysm. SKELETON: Hypermetabolic bone metastases again identified. Hypermetabolic L24 lesion measured at 14 mm previously is 15 mm today (121/3) with SUV max = 3.1 today compared to 2.9 previously. Interval progression of the right iliac lesion with sclerotic abnormality now measuring 2.2 cm (image 197/3)  compared to 1.0 cm previously. SUV max = 6.2 today compared to 3.3 previously. Interval progression of a lucent lesion in the left iliac bone measuring 1.7 cm today compared to 0.6 cm previously. SUV max = 7.9 today compared to 3.5 previously. Incidental CT findings: Degenerative changes noted lumbar spine. IMPRESSION: 1. Interval continued progression of hypermetabolic skeletal metastases at the T10 level and in both iliac bones. 2. Stable appearance of masslike consolidative opacity in the medial left upper lobe. Imaging features remain compatible with post treatment scarring. 3.  Aortic Atherosclerois (ICD10-170.0) Electronically Signed   By: Misty Stanley M.D.   On: 12/01/2020 14:42    ONCOLOGY HISTORY:  Biopsy of lung nodule on November 27, 2016 revealed adenocarcinoma, but there was no invasive component on the  sample. Biopsy of mediastinal lymph node had insufficient material. Patient ultimately declined any further biopsies or surgery and elected to proceed with XRT alone which he completed in April 2018.  PET scan on January 22, 2019 reviewed independently with increased metabolic activity prior to previous with SUV increasing from 6.5 up to 8.8.  Patient declined intervention at that time and elected to proceed with simple observation.  Repeat CT scan on April 21, 2019 reviewed independently with progressive 6.4 cm mass in the left upper lobe.  Patient declined systemic treatment, but agreed to proceed with XRT which she completed in approximately August 2020.  CT scan on September 23, 2019 reviewed independently with no obvious evidence of recurrent or progressive disease.   ASSESSMENT:  Adenocarcinoma of upper lobe of left lung   PLAN:   1.  Adenocarcinoma of upper lobe of left lung: PET scan results from December 01, 2020 reviewed independently and report as above with stable disease at his T10 vertebral body, but has appeared progression in his right and left iliac crest.  He has no other evidence of disease.  Consultation with radiation oncology did not feel XRT would be beneficial.  After lengthy discussion with the patient and his daughter, is agreed upon that systemic therapy is not needed at this time given his minimal disease burden.  Return to clinic in 3 months with repeat PET scan and further evaluation.  2.  Shoulder/back pain: Patient reports this has been evident for years/decades: Continue tramadol as needed.  I spent a total of 30 minutes reviewing chart data, face-to-face evaluation with the patient, counseling and coordination of care as detailed above.   Patient expressed understanding and was in agreement with this plan. He also understands that He can call clinic at any time with any questions, concerns, or complaints.     [name], MD   12/09/2020 6:15 AM

## 2020-12-07 ENCOUNTER — Encounter: Payer: Self-pay | Admitting: Radiation Oncology

## 2020-12-08 ENCOUNTER — Inpatient Hospital Stay: Payer: Medicare HMO | Attending: Oncology | Admitting: Oncology

## 2020-12-08 ENCOUNTER — Ambulatory Visit
Admission: RE | Admit: 2020-12-08 | Discharge: 2020-12-08 | Disposition: A | Discharge status: Home / Self Care | Payer: Medicare HMO | Source: Ambulatory Visit | Attending: Radiation Oncology | Admitting: Radiation Oncology

## 2020-12-08 ENCOUNTER — Encounter: Payer: Self-pay | Admitting: Oncology

## 2020-12-08 VITALS — BP 107/73 | HR 83 | Temp 97.6°F | Resp 16 | Wt 131.6 lb

## 2020-12-08 VITALS — BP 107/73 | HR 83 | Temp 97.6°F | Resp 16 | Ht 68.0 in | Wt 131.0 lb

## 2020-12-08 DIAGNOSIS — C3490 Malignant neoplasm of unspecified part of unspecified bronchus or lung: Secondary | ICD-10-CM

## 2020-12-08 DIAGNOSIS — C3412 Malignant neoplasm of upper lobe, left bronchus or lung: Secondary | ICD-10-CM | POA: Diagnosis not present

## 2020-12-08 DIAGNOSIS — Z923 Personal history of irradiation: Secondary | ICD-10-CM | POA: Insufficient documentation

## 2020-12-08 DIAGNOSIS — Z87891 Personal history of nicotine dependence: Secondary | ICD-10-CM | POA: Insufficient documentation

## 2020-12-08 DIAGNOSIS — C7951 Secondary malignant neoplasm of bone: Secondary | ICD-10-CM | POA: Diagnosis not present

## 2020-12-08 NOTE — Progress Notes (Signed)
Radiation Oncology Follow up Note  Name: Scott Gross   Date:   12/08/2020 MRN:  [id number] DOB: 08-31-1934    This 87 y.o. male presents to the clinic today for 90-month follow-up status post radiation therapy to his T10 vertebral body and patient with known stage IV non-small cell lung cancer.  REFERRING PROVIDER: [name], MD  HPI: Patient is an 87 year old male previously treated to his left upper lobe for stage I adenocarcinoma over 3 years prior to develop bone metastasis with a hypermetabolic lesion at I14 which was radiated now out to 9 months.  He is having no pain in his back at this time and no pain in his pelvis he is ambulating well.  He is currently under observation.  He did recently have a PET CT scan.  Showing extremely minimal less than 0.1 mm of increased size of his T10 vertebral body.  He does have some increased lesions in his iliac bones.  Both are asymptomatic.  He also has stable like masslike consolidative opacity in the medial left upper lobe compatible with posttreatment scarring.  He is seen today accompanied by his daughter.  COMPLICATIONS OF TREATMENT: none  FOLLOW UP COMPLIANCE: keeps appointments   PHYSICAL EXAM:  BP 107/73 (BP Location: Right Arm, Patient Position: Sitting, Cuff Size: Normal)   Pulse 83   Temp 97.6 F (36.4 C)   Resp 16   Wt 131 lb 9.6 oz (59.7 kg)   BMI 20.01 kg/m  Range of motion is lower extremities does not elicit pain motor or sensory and DTR levels are equal and symmetric in the lower extremities.  Deep palpation of the spine does not elicit pain.  Well-developed well-nourished patient in NAD. HEENT reveals PERLA, EOMI, discs not visualized.  Oral cavity is clear. No oral mucosal lesions are identified. Neck is clear without evidence of cervical or supraclavicular adenopathy. Lungs are clear to A&P. Cardiac examination is essentially unremarkable with regular rate and rhythm without murmur rub or thrill. Abdomen is benign with  no organomegaly or masses noted. Motor sensory and DTR levels are equal and symmetric in the upper and lower extremities. Cranial nerves II through XII are grossly intact. Proprioception is intact. No peripheral adenopathy or edema is identified. No motor or sensory levels are noted. Crude visual fields are within normal range.  RADIOLOGY RESULTS: PET/CT is reviewed compatible with above-stated findings  PLAN: At this time I would recommend continued observation.  See no need for palliative radiation therapy since patient is in no pain and these lesions are not progressing significantly at this time.  I have asked to see him back in 4 to 5 months for follow-up.  He continues close follow-up care with medical oncology.  I would like to take this opportunity to thank you for allowing me to participate in the care of your patient.Noreene Filbert, MD

## 2020-12-09 ENCOUNTER — Ambulatory Visit: Payer: Medicare HMO | Admitting: Oncology

## 2020-12-09 ENCOUNTER — Ambulatory Visit: Payer: Medicare HMO | Admitting: Radiation Oncology

## 2020-12-12 IMAGING — PT NM PET TUM IMG RESTAG (PS) SKULL BASE T - THIGH
1 of 9 series · 1 of 25 positions shown · non-contrast
Comparison: PET-CT 01/20/2020

CLINICAL DATA: Sub treatment strategy for LEFT lung carcinoma.

EXAM:
NUCLEAR MEDICINE PET SKULL BASE TO THIGH
TECHNIQUE: 7.5 mCi F-18 FDG was injected intravenously. Full-ring PET imaging
was performed from the skull base to thigh after the radiotracer. CT
data was obtained and used for attenuation correction and anatomic
localization.
Fasting blood glucose: 110 mg/dl

[Series 3: ct wb 5.0 b30f · axial · 5.0mm · 0.98mm/px · 1 of 290 slices shown]
[im 290/290  brain]
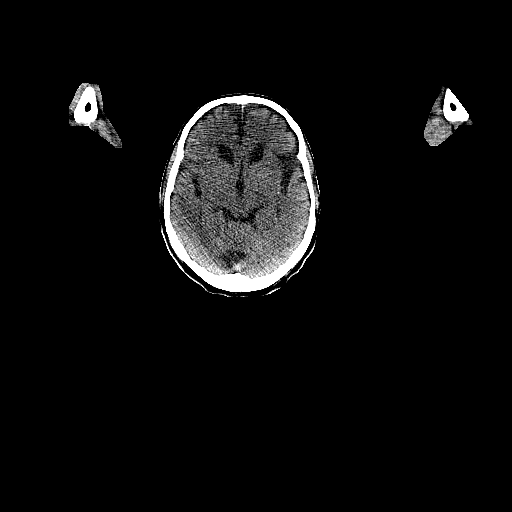

[1 of 25 positions shown; findings below may reference images not displayed]

FINDINGS: Mediastinal blood pool activity: SUV max

Liver activity: SUV max NA

NECK: No hypermetabolic lymph nodes in the neck.

Incidental CT findings: none

CHEST:

consolidation in the medial aspect of the LEFT upper lobe is
unchanged in size measuring 5.2 x 3.5 cm compared with 5.1 x 3.9 cm
on comparison exam remeasured. There is continued moderate metabolic
activity within this consolidation with SUV max equal 3.8 compared
with SUV max equal 5.4.

No hypermetabolic mediastinal lymph nodes. No suspicious pulmonary
nodules.

Incidental CT findings: none

ABDOMEN/PELVIS: No abnormal hypermetabolic activity within the
liver, pancreas, adrenal glands, or spleen. No hypermetabolic lymph
nodes in the abdomen or pelvis.

Incidental CT findings: none

SKELETON: Interval enlargement of a sclerotic lesion in the T10
vertebral body measuring 14 mm (image 117/3) compared to 10 mm.
Lesion has similar metabolic activity SUV max equal 2.9 compared to
SUV max equal

Sclerotic lesion the posterior RIGHT iliac bone measuring 10 mm
(image 193) is increased from 5 mm. This lesion is increased
radiotracer activity with SUV max equal 3.3.

Incidental CT findings: none
IMPRESSION: 1. Mild progression of skeletal metastasis within enlarging
hypermetabolic sclerotic lesions at T10 and in the RIGHT iliac bone.
2. Stable masslike consolidation in the LEFT upper lobe with
moderate metabolic activity is favored benign post therapy
inflammation.

## 2021-02-14 DIAGNOSIS — I739 Peripheral vascular disease, unspecified: Secondary | ICD-10-CM | POA: Diagnosis not present

## 2021-02-14 DIAGNOSIS — I208 Other forms of angina pectoris: Secondary | ICD-10-CM | POA: Diagnosis not present

## 2021-02-14 DIAGNOSIS — R011 Cardiac murmur, unspecified: Secondary | ICD-10-CM | POA: Diagnosis not present

## 2021-02-14 DIAGNOSIS — I701 Atherosclerosis of renal artery: Secondary | ICD-10-CM | POA: Diagnosis not present

## 2021-02-14 DIAGNOSIS — I10 Essential (primary) hypertension: Secondary | ICD-10-CM | POA: Diagnosis not present

## 2021-02-14 DIAGNOSIS — I2510 Atherosclerotic heart disease of native coronary artery without angina pectoris: Secondary | ICD-10-CM | POA: Diagnosis not present

## 2021-02-14 DIAGNOSIS — R55 Syncope and collapse: Secondary | ICD-10-CM | POA: Diagnosis not present

## 2021-02-14 DIAGNOSIS — R0602 Shortness of breath: Secondary | ICD-10-CM | POA: Diagnosis not present

## 2021-02-14 DIAGNOSIS — I480 Paroxysmal atrial fibrillation: Secondary | ICD-10-CM | POA: Diagnosis not present

## 2021-02-20 DIAGNOSIS — R011 Cardiac murmur, unspecified: Secondary | ICD-10-CM | POA: Diagnosis not present

## 2021-02-20 DIAGNOSIS — I2510 Atherosclerotic heart disease of native coronary artery without angina pectoris: Secondary | ICD-10-CM | POA: Diagnosis not present

## 2021-02-28 ENCOUNTER — Ambulatory Visit
Admission: RE | Admit: 2021-02-28 | Discharge: 2021-02-28 | Disposition: A | Discharge status: Home / Self Care | Payer: Medicare HMO | Source: Ambulatory Visit | Attending: Oncology | Admitting: Oncology

## 2021-02-28 ENCOUNTER — Other Ambulatory Visit: Payer: Self-pay

## 2021-02-28 DIAGNOSIS — C7951 Secondary malignant neoplasm of bone: Secondary | ICD-10-CM | POA: Insufficient documentation

## 2021-02-28 DIAGNOSIS — C3412 Malignant neoplasm of upper lobe, left bronchus or lung: Secondary | ICD-10-CM | POA: Diagnosis not present

## 2021-02-28 DIAGNOSIS — Z1211 Encounter for screening for malignant neoplasm of colon: Secondary | ICD-10-CM | POA: Diagnosis not present

## 2021-02-28 DIAGNOSIS — C3490 Malignant neoplasm of unspecified part of unspecified bronchus or lung: Secondary | ICD-10-CM | POA: Diagnosis not present

## 2021-02-28 DIAGNOSIS — Z0389 Encounter for observation for other suspected diseases and conditions ruled out: Secondary | ICD-10-CM | POA: Diagnosis not present

## 2021-02-28 LAB — GLUCOSE, CAPILLARY: Glucose-Capillary: 123 mg/dL — ABNORMAL HIGH (ref 70–99)

## 2021-02-28 MED ORDER — FLUDEOXYGLUCOSE F - 18 (FDG) INJECTION
6.8000 | Freq: Once | INTRAVENOUS | Status: AC | PRN
  Administered 2021-02-28: 6.96 via INTRAVENOUS

## 2021-03-02 NOTE — Progress Notes (Deleted)
[place]  Telephone:(336) [phone] Fax:(336) [phone]  ID: Scott Gross OB: Jun 09, 1934  MR#: [id number]  GTX#:[id number]  Patient Care Team: [name], MD as PCP - General (Family Medicine) [name], MD as Consulting Physician (Oncology)   CHIEF COMPLAINT: Adenocarcinoma of upper lobe of left lung.  INTERVAL HISTORY: Patient returns to clinic today for further evaluation and discussion of his PET scan results.  He currently feels well and is asymptomatic.  He does not complain of any weakness or fatigue.  He denies pain.  He has a good appetite and denies weight loss.  He has no neurologic complaints. He denies any chest pain, cough, hemoptysis, or shortness of breath. He has no nausea, vomiting, constipation, or diarrhea. He has no urinary complaints.  Patient offers no specific complaints today.  REVIEW OF SYSTEMS:   Review of Systems  Constitutional: Negative.  Negative for fever, malaise/fatigue and weight loss.  Respiratory: Negative.  Negative for cough, hemoptysis and shortness of breath.   Cardiovascular: Negative.  Negative for chest pain and leg swelling.  Gastrointestinal: Negative.  Negative for abdominal pain.  Genitourinary: Negative.  Negative for dysuria.  Musculoskeletal: Negative.  Negative for back pain and joint pain.  Skin: Negative.  Negative for rash.  Neurological: Negative.  Negative for sensory change, focal weakness, weakness and headaches.  Psychiatric/Behavioral: Negative.  The patient is not nervous/anxious.     As per HPI. Otherwise, a complete review of systems is negative.  PAST MEDICAL HISTORY: Past Medical History:  Diagnosis Date  . Arthritis   . CAD (coronary artery disease) Nov. 12, 2012   Inferior ST elevation MI in November of 2012 with ventricular fibrillation arrest. Status post drug-eluting stent placement to the mid LAD. Most recent cardiac catheterization in June of this year showed patent stent  with minimal restenosis, 75% proximal LAD and 80% distal LAD which were unchanged from before. Mild disease in the left circumflex and moderate RCA disease. Ejection fraction was 55%.  . Cancer of upper lobe of left lung ([place]) 11/2016   Rad tx's.  . Cardiac arrest [facility]) Nov. 2012   x 2   . Cardiac arrest - ventricular fibrillation   . HOH (hard of hearing)    Left Hearing Aid  . Hyperlipidemia   . Hypertension   . Post PTCA 2013   x2  . ST elevation MI (STEMI) ([place]) 08/26/2011    PAST SURGICAL HISTORY: Past Surgical History:  Procedure Laterality Date  . CARDIAC CATHETERIZATION  2013   @ [place]; [facility] s/p stent   . CORONARY ANGIOPLASTY    . ELECTROMAGNETIC NAVIGATION BROCHOSCOPY N/A 11/27/2016   Procedure: ELECTROMAGNETIC NAVIGATION BRONCHOSCOPY;  Surgeon: [name], MD;  Location: [facility] ORS;  Service: Cardiopulmonary;  Laterality: N/A;    FAMILY HISTORY: Family History  Problem Relation Age of Onset  . Heart attack Mother   . Heart attack Father     ADVANCED DIRECTIVES (Y/N):  N  HEALTH MAINTENANCE: Social History   Tobacco Use  . Smoking status: Former Smoker    Packs/day: 1.00    Types: Cigarettes    Quit date: 08/27/2011    Years since quitting: 9.5  . Smokeless tobacco: Never Used  Vaping Use  . Vaping Use: Never used  Substance Use Topics  . Alcohol use: No  . Drug use: No     Colonoscopy:  PAP:  Bone density:  Lipid panel:  Allergies  Allergen Reactions  . Statins Other (See Comments)  Arthralagia    Current Outpatient Medications  Medication Sig Dispense Refill  . amLODipine (NORVASC) 5 MG tablet Take 5 mg by mouth 2 (two) times daily as needed.     Marland Kitchen losartan-hydrochlorothiazide (HYZAAR) 50-12.5 MG tablet Take 1 tablet by mouth daily.    . metoprolol succinate (TOPROL-XL) 50 MG 24 hr tablet Take 50 mg by mouth daily.     No current facility-administered medications for this visit.    OBJECTIVE: There were no vitals filed for this  visit.   There is no height or weight on file to calculate BMI.    ECOG FS:0 - Asymptomatic  General: Well-developed, well-nourished, no acute distress. Eyes: Pink conjunctiva, anicteric sclera. HEENT: Normocephalic, moist mucous membranes. Lungs: No audible wheezing or coughing. Heart: Regular rate and rhythm. Abdomen: Soft, nontender, no obvious distention. Musculoskeletal: No edema, cyanosis, or clubbing. Neuro: Alert, answering all questions appropriately. Cranial nerves grossly intact. Skin: No rashes or petechiae noted. Psych: Normal affect.   LAB RESULTS:  Lab Results  Component Value Date   NA 133 (L) 10/04/2020   K 3.5 10/04/2020   CL 91 (L) 10/04/2020   CO2 30 10/04/2020   GLUCOSE 133 (H) 10/04/2020   BUN 10 10/04/2020   CREATININE 0.91 10/04/2020   CALCIUM 9.0 10/04/2020   PROT 7.2 10/04/2020   ALBUMIN 4.0 10/04/2020   AST 19 10/04/2020   ALT 12 10/04/2020   ALKPHOS 69 10/04/2020   BILITOT 1.1 10/04/2020   GFRNONAA >60 10/04/2020   GFRAA >60 03/15/2020    Lab Results  Component Value Date   WBC 10.2 10/04/2020   NEUTROABS 7.4 10/04/2020   HGB 16.4 10/04/2020   HCT 45.6 10/04/2020   MCV 86.7 10/04/2020   PLT 281 10/04/2020     STUDIES: NM PET Image Restage (PS) Skull Base to Thigh  Result Date: 03/01/2021 CLINICAL DATA:  Subsequent treatment strategy for non-small-cell lung cancer. EXAM: NUCLEAR MEDICINE PET SKULL BASE TO THIGH TECHNIQUE: 7.0 mCi F-18 FDG was injected intravenously. Full-ring PET imaging was performed from the skull base to thigh after the radiotracer. CT data was obtained and used for attenuation correction and anatomic localization. Fasting blood glucose: 123 mg/dl COMPARISON:  12/01/2020 FINDINGS: Mediastinal blood pool activity: SUV max 2.4 Liver activity: SUV max NA NECK: No hypermetabolic lymph nodes in the neck. Incidental CT findings: none CHEST: Stable appearance and low level FDG accumulation in the medial left upper lobe  scarring, incorporating fiducial markers. SUV max = 3.3 today compared to 3.6 previously. No new suspicious hypermetabolic pulmonary nodule or mass. No hypermetabolic lymphadenopathy in the chest. Incidental CT findings: Atherosclerotic calcification is noted in the wall of the thoracic aorta. Coronary artery calcification is evident. ABDOMEN/PELVIS: No abnormal hypermetabolic activity within the liver, pancreas, adrenal glands, or spleen. No hypermetabolic lymph nodes in the abdomen or pelvis. Focal uptake identified in the region of the rectosigmoid junction without evidence for mass lesion on CT imaging. In retrospect, this was present on the previous exam is well. SUV max = 4.2. Correlation with colorectal cancer screening history recommended as adenoma or carcinoma of the rectum could have this appearance. Incidental CT findings: There is abdominal aortic atherosclerosis without aneurysm. Prostate gland is enlarged. SKELETON: Hypermetabolic bony metastases again noted in both iliac bones. Posterior right iliac lesion demonstrates SUV max = 6.4 today compared to 6.2 previously. CT image 193/3 demonstrates AP diameter of 2.9 cm today which compares to 2.2 previously. Anterior left iliac lesion demonstrates SUV max = 8.8 today  compared to 7.9 previously. This lesion was measured previously at 1.7 cm now measures 2.4 cm in the same dimension. Previously characterized T10 lesion remains hypermetabolic with SUV max = 4.2 today compared to 3.1 previously Incidental CT findings: none IMPRESSION: 1. Mild interval progression of the 3 known bony metastases. No new hypermetabolic bony metastases on today's study. No evidence for hypermetabolic soft tissue metastases in the chest, abdomen, or pelvis. 2. Stable appearance of scarring in the medial left hemithorax. 3. Small focus of mild hypermetabolism noted in the colon, in the region of the rectosigmoid junction. This has been persistent over 2 exams and is at upper  portion to background colonic uptake. As such, correlation with colorectal cancer screening history recommended as adenoma or carcinoma could have this appearance. 4.  Aortic Atherosclerois (ICD10-170.0) Electronically Signed   By: Misty Stanley M.D.   On: 03/01/2021 09:31    ONCOLOGY HISTORY:  Biopsy of lung nodule on November 27, 2016 revealed adenocarcinoma, but there was no invasive component on the sample. Biopsy of mediastinal lymph node had insufficient material. Patient ultimately declined any further biopsies or surgery and elected to proceed with XRT alone which he completed in April 2018.  PET scan on January 22, 2019 reviewed independently with increased metabolic activity prior to previous with SUV increasing from 6.5 up to 8.8.  Patient declined intervention at that time and elected to proceed with simple observation.  Repeat CT scan on April 21, 2019 reviewed independently with progressive 6.4 cm mass in the left upper lobe.  Patient declined systemic treatment, but agreed to proceed with XRT which she completed in approximately August 2020.  CT scan on September 23, 2019 reviewed independently with no obvious evidence of recurrent or progressive disease.   ASSESSMENT:  Adenocarcinoma of upper lobe of left lung   PLAN:   1.  Adenocarcinoma of upper lobe of left lung: PET scan results from December 01, 2020 reviewed independently and report as above with stable disease at his T10 vertebral body, but has appeared progression in his right and left iliac crest.  He has no other evidence of disease.  Consultation with radiation oncology did not feel XRT would be beneficial.  After lengthy discussion with the patient and his daughter, is agreed upon that systemic therapy is not needed at this time given his minimal disease burden.  Return to clinic in 3 months with repeat PET scan and further evaluation.  2.  Shoulder/back pain: Patient reports this has been evident for years/decades: Continue tramadol  as needed.  I spent a total of 30 minutes reviewing chart data, face-to-face evaluation with the patient, counseling and coordination of care as detailed above.   Patient expressed understanding and was in agreement with this plan. He also understands that He can call clinic at any time with any questions, concerns, or complaints.     [name], MD   03/02/2021 6:56 PM

## 2021-03-06 ENCOUNTER — Telehealth: Payer: Self-pay | Admitting: Oncology

## 2021-03-06 NOTE — Telephone Encounter (Signed)
Patient's wife Stanton Kidney) called to report that patient has cough + chest congestion and has had 2 negative at home rapid COVID tests. She declined a virtual visit. Appointment with Dr. Grayland Ormond and Dr. Baruch Gouty have been rescheduled for  03/16/21. Confirmed day and time with patient's wife and gave her phone # for [facility] testing because she expressed interest in patient having a PCR done also.

## 2021-03-07 DIAGNOSIS — Z20822 Contact with and (suspected) exposure to covid-19: Secondary | ICD-10-CM | POA: Diagnosis not present

## 2021-03-07 DIAGNOSIS — Z03818 Encounter for observation for suspected exposure to other biological agents ruled out: Secondary | ICD-10-CM | POA: Diagnosis not present

## 2021-03-09 ENCOUNTER — Inpatient Hospital Stay: Payer: Medicare HMO | Admitting: Oncology

## 2021-03-09 ENCOUNTER — Ambulatory Visit: Payer: Medicare HMO | Admitting: Radiation Oncology

## 2021-03-09 DIAGNOSIS — C3412 Malignant neoplasm of upper lobe, left bronchus or lung: Secondary | ICD-10-CM

## 2021-03-09 NOTE — Progress Notes (Signed)
[place]  Telephone:(336) [phone] Fax:(336) [phone]  ID: Scott Gross OB: [birth year]  MR#: [id number]  FIE#:[id number]  Patient Care Team: [name], MD as PCP - General (Family Medicine) [name], MD as Consulting Physician (Oncology)   CHIEF COMPLAINT: Adenocarcinoma of upper lobe of left lung.  INTERVAL HISTORY: Patient returns to clinic today for further evaluation, discussion of his PET scan results, and treatment planning.  He continues to feel well and remains asymptomatic.  He does not complain of any weakness or fatigue.  He denies pain.  He has a good appetite and denies weight loss.  He has no neurologic complaints. He denies any chest pain, cough, hemoptysis, or shortness of breath. He has no nausea, vomiting, constipation, or diarrhea. He has no urinary complaints.  Patient feels at his baseline and offers no specific complaints today.  REVIEW OF SYSTEMS:   Review of Systems  Constitutional: Negative.  Negative for fever, malaise/fatigue and weight loss.  Respiratory: Negative.  Negative for cough, hemoptysis and shortness of breath.   Cardiovascular: Negative.  Negative for chest pain and leg swelling.  Gastrointestinal: Negative.  Negative for abdominal pain.  Genitourinary: Negative.  Negative for dysuria.  Musculoskeletal: Negative.  Negative for back pain and joint pain.  Skin: Negative.  Negative for rash.  Neurological: Negative.  Negative for sensory change, focal weakness, weakness and headaches.  Psychiatric/Behavioral: Negative.  The patient is not nervous/anxious.     As per HPI. Otherwise, a complete review of systems is negative.  PAST MEDICAL HISTORY: Past Medical History:  Diagnosis Date  . Arthritis   . CAD (coronary artery disease) Nov. 12, 2012   Inferior ST elevation MI in November of 2012 with ventricular fibrillation arrest. Status post drug-eluting stent placement to the mid LAD. Most recent cardiac  catheterization in June of this year showed patent stent with minimal restenosis, 75% proximal LAD and 80% distal LAD which were unchanged from before. Mild disease in the left circumflex and moderate RCA disease. Ejection fraction was 55%.  . Cancer of upper lobe of left lung ([place]) 11/2016   Rad tx's.  . Cardiac arrest [facility]) Nov. 2012   x 2   . Cardiac arrest - ventricular fibrillation   . HOH (hard of hearing)    Left Hearing Aid  . Hyperlipidemia   . Hypertension   . Post PTCA 2013   x2  . ST elevation MI (STEMI) ([place]) 08/26/2011    PAST SURGICAL HISTORY: Past Surgical History:  Procedure Laterality Date  . CARDIAC CATHETERIZATION  2013   @ [place]; [facility] s/p stent   . CORONARY ANGIOPLASTY    . ELECTROMAGNETIC NAVIGATION BROCHOSCOPY N/A 11/27/2016   Procedure: ELECTROMAGNETIC NAVIGATION BRONCHOSCOPY;  Surgeon: [name], MD;  Location: [facility] ORS;  Service: Cardiopulmonary;  Laterality: N/A;    FAMILY HISTORY: Family History  Problem Relation Age of Onset  . Heart attack Mother   . Heart attack Father     ADVANCED DIRECTIVES (Y/N):  N  HEALTH MAINTENANCE: Social History   Tobacco Use  . Smoking status: Former Smoker    Packs/day: 1.00    Types: Cigarettes    Quit date: 08/27/2011    Years since quitting: 9.5  . Smokeless tobacco: Never Used  Vaping Use  . Vaping Use: Never used  Substance Use Topics  . Alcohol use: No  . Drug use: No     Colonoscopy:  PAP:  Bone density:  Lipid panel:  Allergies  Allergen Reactions  .  Statins Other (See Comments)    Arthralagia    Current Outpatient Medications  Medication Sig Dispense Refill  . albuterol (VENTOLIN HFA) 108 (90 Base) MCG/ACT inhaler Inhale into the lungs every 6 (six) hours as needed.    Marland Kitchen amLODipine (NORVASC) 5 MG tablet Take 5 mg by mouth 2 (two) times daily as needed.     . benzonatate (TESSALON) 200 MG capsule Take by mouth 3 (three) times daily as needed.    . chlorpheniramine-HYDROcodone  (TUSSIONEX) 10-8 MG/5ML SUER Take by mouth.    [name] 5 MG TABS tablet 5 mg 2 (two) times daily.    Marland Kitchen lisinopril-hydrochlorothiazide (ZESTORETIC) 20-12.5 MG tablet Take 1 tablet by mouth daily.    Marland Kitchen losartan-hydrochlorothiazide (HYZAAR) 50-12.5 MG tablet Take 1 tablet by mouth daily.    . metoprolol succinate (TOPROL-XL) 50 MG 24 hr tablet Take 50 mg by mouth daily.     No current facility-administered medications for this visit.    OBJECTIVE: Vitals:   03/16/21 1406  BP: 118/74  Pulse: 74  Resp: 20  Temp: (!) 97.1 F (36.2 C)  SpO2: 96%     Body mass index is 19.89 kg/m.    ECOG FS:0 - Asymptomatic  General: Thin, no acute distress. Eyes: Pink conjunctiva, anicteric sclera. HEENT: Normocephalic, moist mucous membranes. Lungs: No audible wheezing or coughing. Heart: Regular rate and rhythm. Abdomen: Soft, nontender, no obvious distention. Musculoskeletal: No edema, cyanosis, or clubbing. Neuro: Alert, answering all questions appropriately. Cranial nerves grossly intact. Skin: No rashes or petechiae noted. Psych: Normal affect.  LAB RESULTS:  Lab Results  Component Value Date   NA 133 (L) 10/04/2020   K 3.5 10/04/2020   CL 91 (L) 10/04/2020   CO2 30 10/04/2020   GLUCOSE 133 (H) 10/04/2020   BUN 10 10/04/2020   CREATININE 0.91 10/04/2020   CALCIUM 9.0 10/04/2020   PROT 7.2 10/04/2020   ALBUMIN 4.0 10/04/2020   AST 19 10/04/2020   ALT 12 10/04/2020   ALKPHOS 69 10/04/2020   BILITOT 1.1 10/04/2020   GFRNONAA >60 10/04/2020   GFRAA >60 03/15/2020    Lab Results  Component Value Date   WBC 10.2 10/04/2020   NEUTROABS 7.4 10/04/2020   HGB 16.4 10/04/2020   HCT 45.6 10/04/2020   MCV 86.7 10/04/2020   PLT 281 10/04/2020     STUDIES: NM PET Image Restage (PS) Skull Base to Thigh  Result Date: 03/01/2021 CLINICAL DATA:  Subsequent treatment strategy for non-small-cell lung cancer. EXAM: NUCLEAR MEDICINE PET SKULL BASE TO THIGH TECHNIQUE: 7.0 mCi F-18 FDG  was injected intravenously. Full-ring PET imaging was performed from the skull base to thigh after the radiotracer. CT data was obtained and used for attenuation correction and anatomic localization. Fasting blood glucose: 123 mg/dl COMPARISON:  12/01/2020 FINDINGS: Mediastinal blood pool activity: SUV max 2.4 Liver activity: SUV max NA NECK: No hypermetabolic lymph nodes in the neck. Incidental CT findings: none CHEST: Stable appearance and low level FDG accumulation in the medial left upper lobe scarring, incorporating fiducial markers. SUV max = 3.3 today compared to 3.6 previously. No new suspicious hypermetabolic pulmonary nodule or mass. No hypermetabolic lymphadenopathy in the chest. Incidental CT findings: Atherosclerotic calcification is noted in the wall of the thoracic aorta. Coronary artery calcification is evident. ABDOMEN/PELVIS: No abnormal hypermetabolic activity within the liver, pancreas, adrenal glands, or spleen. No hypermetabolic lymph nodes in the abdomen or pelvis. Focal uptake identified in the region of the rectosigmoid junction without evidence for mass  lesion on CT imaging. In retrospect, this was present on the previous exam is well. SUV max = 4.2. Correlation with colorectal cancer screening history recommended as adenoma or carcinoma of the rectum could have this appearance. Incidental CT findings: There is abdominal aortic atherosclerosis without aneurysm. Prostate gland is enlarged. SKELETON: Hypermetabolic bony metastases again noted in both iliac bones. Posterior right iliac lesion demonstrates SUV max = 6.4 today compared to 6.2 previously. CT image 193/3 demonstrates AP diameter of 2.9 cm today which compares to 2.2 previously. Anterior left iliac lesion demonstrates SUV max = 8.8 today compared to 7.9 previously. This lesion was measured previously at 1.7 cm now measures 2.4 cm in the same dimension. Previously characterized T10 lesion remains hypermetabolic with SUV max = 4.2  today compared to 3.1 previously Incidental CT findings: none IMPRESSION: 1. Mild interval progression of the 3 known bony metastases. No new hypermetabolic bony metastases on today's study. No evidence for hypermetabolic soft tissue metastases in the chest, abdomen, or pelvis. 2. Stable appearance of scarring in the medial left hemithorax. 3. Small focus of mild hypermetabolism noted in the colon, in the region of the rectosigmoid junction. This has been persistent over 2 exams and is at upper portion to background colonic uptake. As such, correlation with colorectal cancer screening history recommended as adenoma or carcinoma could have this appearance. 4.  Aortic Atherosclerois (ICD10-170.0) Electronically Signed   By: Misty Stanley M.D.   On: 03/01/2021 09:31    ONCOLOGY HISTORY:  Biopsy of lung nodule on November 27, 2016 revealed adenocarcinoma, but there was no invasive component on the sample. Biopsy of mediastinal lymph node had insufficient material. Patient ultimately declined any further biopsies or surgery and elected to proceed with XRT alone which he completed in April 2018.  PET scan on January 22, 2019 reviewed independently with increased metabolic activity prior to previous with SUV increasing from 6.5 up to 8.8.  Patient declined intervention at that time and elected to proceed with simple observation.  Repeat CT scan on April 21, 2019 reviewed independently with progressive 6.4 cm mass in the left upper lobe.  Patient declined systemic treatment, but agreed to proceed with XRT which she completed in approximately August 2020.  CT scan on September 23, 2019 reviewed independently with no obvious evidence of recurrent or progressive disease.   ASSESSMENT:  Adenocarcinoma of upper lobe of left lung   PLAN:   1.  Adenocarcinoma of upper lobe of left lung: PET scan results from Feb 28, 2021 reviewed independently and reported as above the mild progression of disease in his 3 known bony metastasis  at T10, and bilateral iliac crest.  No new lesions were noted.  Patient was evaluated by radiation oncology earlier today and ultimately decided to pursue XRT at this time.  Systemic therapy is not needed given the minimal disease burden.  Proceed with radiation as planned.  Return to clinic in 3 months with repeat PET scan and further evaluation. 2.  Shoulder/back pain: Patient does not complain of this today.  He reports this has been evident for years/decades: Continue tramadol as needed.  I spent a total of 20 minutes reviewing chart data, face-to-face evaluation with the patient, counseling and coordination of care as detailed above.    Patient expressed understanding and was in agreement with this plan. He also understands that He can call clinic at any time with any questions, concerns, or complaints.     [name], MD   03/17/2021  6:07 AM

## 2021-03-10 DIAGNOSIS — J40 Bronchitis, not specified as acute or chronic: Secondary | ICD-10-CM | POA: Diagnosis not present

## 2021-03-10 DIAGNOSIS — R059 Cough, unspecified: Secondary | ICD-10-CM | POA: Diagnosis not present

## 2021-03-10 DIAGNOSIS — J22 Unspecified acute lower respiratory infection: Secondary | ICD-10-CM | POA: Diagnosis not present

## 2021-03-15 DIAGNOSIS — Z20822 Contact with and (suspected) exposure to covid-19: Secondary | ICD-10-CM | POA: Diagnosis not present

## 2021-03-15 DIAGNOSIS — Z03818 Encounter for observation for suspected exposure to other biological agents ruled out: Secondary | ICD-10-CM | POA: Diagnosis not present

## 2021-03-16 ENCOUNTER — Inpatient Hospital Stay: Payer: Medicare HMO | Attending: Oncology | Admitting: Oncology

## 2021-03-16 ENCOUNTER — Encounter: Payer: Self-pay | Admitting: Radiation Oncology

## 2021-03-16 ENCOUNTER — Ambulatory Visit
Admission: RE | Admit: 2021-03-16 | Discharge: 2021-03-16 | Disposition: A | Discharge status: Home / Self Care | Payer: Medicare HMO | Source: Ambulatory Visit | Attending: Radiation Oncology | Admitting: Radiation Oncology

## 2021-03-16 ENCOUNTER — Encounter: Payer: Self-pay | Admitting: Oncology

## 2021-03-16 VITALS — BP 118/74 | HR 74 | Temp 97.1°F | Resp 20 | Wt 130.8 lb

## 2021-03-16 VITALS — BP 118/74 | HR 74 | Resp 20 | Wt 130.8 lb

## 2021-03-16 DIAGNOSIS — Z87891 Personal history of nicotine dependence: Secondary | ICD-10-CM | POA: Diagnosis not present

## 2021-03-16 DIAGNOSIS — C7951 Secondary malignant neoplasm of bone: Secondary | ICD-10-CM | POA: Diagnosis not present

## 2021-03-16 DIAGNOSIS — Z08 Encounter for follow-up examination after completed treatment for malignant neoplasm: Secondary | ICD-10-CM | POA: Diagnosis not present

## 2021-03-16 DIAGNOSIS — C3412 Malignant neoplasm of upper lobe, left bronchus or lung: Secondary | ICD-10-CM | POA: Insufficient documentation

## 2021-03-16 DIAGNOSIS — Z923 Personal history of irradiation: Secondary | ICD-10-CM | POA: Diagnosis not present

## 2021-03-16 DIAGNOSIS — C3490 Malignant neoplasm of unspecified part of unspecified bronchus or lung: Secondary | ICD-10-CM

## 2021-03-16 NOTE — Progress Notes (Signed)
Patient here today for follow up regarding lung cancer.

## 2021-03-16 NOTE — Progress Notes (Signed)
Radiation Oncology Follow up Note  Name: Scott Gross   Date:   03/16/2021 MRN:  [id number] DOB: 1934/01/18    This 87 y.o. male presents to the clinic today for 1 year follow-up of prior palliative radiation therapy to T10 vertebral body and patient with known stage IV non-small cell lung cancer.  REFERRING PROVIDER: [name], MD  HPI: Patient is an 87 year old male have he had multiple palliative treatments for adenocarcinoma stage IV of the lung.  We treated his T10 vertebral body over a year ago.  We have been following 2 lesions in his iliac bones as well as a T10 lesion which were both hypermetabolic on recent PET CT scan showing minimal progression..  All 3 lesions are still hypermetabolic and increasing in hypermetabolic activity.  The T10 vertebral body was 4.2 today as compared to 3.1 SUV on prior study back in February.  On my review the lesion was also encroaching on the spinal canal raising the risk of spinal cord compression.  Patient remains asymptomatic specifically denies pain in his pelvis or back.  He is undergoing some treatment now for bronchitis and is slowly recovering from that.  COMPLICATIONS OF TREATMENT: none  FOLLOW UP COMPLIANCE: keeps appointments   PHYSICAL EXAM:  BP 118/74 (BP Location: Left Arm, Patient Position: Sitting)   Pulse 74   Resp 20   Wt 130 lb 12.8 oz (59.3 kg)   SpO2 96%   BMI 19.89 kg/m  Motor sensory and DTR levels are equal and symmetric in the upper and lower extremities.  No pain is elicited on deep palpation of the spine.  Well-developed well-nourished patient in NAD. HEENT reveals PERLA, EOMI, discs not visualized.  Oral cavity is clear. No oral mucosal lesions are identified. Neck is clear without evidence of cervical or supraclavicular adenopathy. Lungs are clear to A&P. Cardiac examination is essentially unremarkable with regular rate and rhythm without murmur rub or thrill. Abdomen is benign with no organomegaly or masses noted.  Motor sensory and DTR levels are equal and symmetric in the upper and lower extremities. Cranial nerves II through XII are grossly intact. Proprioception is intact. No peripheral adenopathy or edema is identified. No motor or sensory levels are noted. Crude visual fields are within normal range.  RADIOLOGY RESULTS: Serial PET CT scans are reviewed compatible with above-stated findings  PLAN: At the present time based on the slow incremental growth and increased hypermetabolic activity of the P29 lesion have opted to retreat this area with SBRT.  Would plan on delivering 20 Gray in 5 fractions.  We will intend to spare the spinal column in her treatment planning.  Risks and benefits of treatment including retreatment of the spine possible fatigue and some possible scarring of the lung all reviewed in detail with the patient and his wife.  I have set him up for treatment planning about 2 weeks to allow him to recover some more from his current pulmonary problems.  I would like to take this opportunity to thank you for allowing me to participate in the care of your patient.Noreene Filbert, MD

## 2021-03-23 ENCOUNTER — Other Ambulatory Visit: Payer: Self-pay | Admitting: *Deleted

## 2021-03-23 DIAGNOSIS — C3412 Malignant neoplasm of upper lobe, left bronchus or lung: Secondary | ICD-10-CM

## 2021-03-23 NOTE — Progress Notes (Signed)
pet

## 2021-03-27 ENCOUNTER — Ambulatory Visit
Admission: RE | Admit: 2021-03-27 | Discharge: 2021-03-27 | Disposition: A | Discharge status: Home / Self Care | Payer: Medicare HMO | Source: Ambulatory Visit | Attending: Radiation Oncology | Admitting: Radiation Oncology

## 2021-03-27 DIAGNOSIS — Z510 Encounter for antineoplastic radiation therapy: Secondary | ICD-10-CM | POA: Insufficient documentation

## 2021-03-27 DIAGNOSIS — C7951 Secondary malignant neoplasm of bone: Secondary | ICD-10-CM | POA: Diagnosis not present

## 2021-03-27 DIAGNOSIS — Z87891 Personal history of nicotine dependence: Secondary | ICD-10-CM | POA: Diagnosis not present

## 2021-03-27 DIAGNOSIS — C3412 Malignant neoplasm of upper lobe, left bronchus or lung: Secondary | ICD-10-CM | POA: Diagnosis not present

## 2021-03-29 DIAGNOSIS — C7951 Secondary malignant neoplasm of bone: Secondary | ICD-10-CM | POA: Diagnosis not present

## 2021-03-29 DIAGNOSIS — Z510 Encounter for antineoplastic radiation therapy: Secondary | ICD-10-CM | POA: Diagnosis not present

## 2021-03-29 DIAGNOSIS — Z87891 Personal history of nicotine dependence: Secondary | ICD-10-CM | POA: Diagnosis not present

## 2021-03-29 DIAGNOSIS — C3412 Malignant neoplasm of upper lobe, left bronchus or lung: Secondary | ICD-10-CM | POA: Diagnosis not present

## 2021-03-30 DIAGNOSIS — E119 Type 2 diabetes mellitus without complications: Secondary | ICD-10-CM | POA: Diagnosis not present

## 2021-03-30 DIAGNOSIS — Z125 Encounter for screening for malignant neoplasm of prostate: Secondary | ICD-10-CM | POA: Diagnosis not present

## 2021-03-30 DIAGNOSIS — I10 Essential (primary) hypertension: Secondary | ICD-10-CM | POA: Diagnosis not present

## 2021-03-30 DIAGNOSIS — E785 Hyperlipidemia, unspecified: Secondary | ICD-10-CM | POA: Diagnosis not present

## 2021-03-30 DIAGNOSIS — Z0000 Encounter for general adult medical examination without abnormal findings: Secondary | ICD-10-CM | POA: Diagnosis not present

## 2021-03-30 DIAGNOSIS — I2510 Atherosclerotic heart disease of native coronary artery without angina pectoris: Secondary | ICD-10-CM | POA: Diagnosis not present

## 2021-04-03 ENCOUNTER — Ambulatory Visit
Admission: RE | Admit: 2021-04-03 | Discharge: 2021-04-03 | Disposition: A | Discharge status: Home / Self Care | Payer: Medicare HMO | Source: Ambulatory Visit | Attending: Radiation Oncology | Admitting: Radiation Oncology

## 2021-04-03 DIAGNOSIS — Z510 Encounter for antineoplastic radiation therapy: Secondary | ICD-10-CM | POA: Diagnosis not present

## 2021-04-03 DIAGNOSIS — C7951 Secondary malignant neoplasm of bone: Secondary | ICD-10-CM | POA: Diagnosis not present

## 2021-04-05 ENCOUNTER — Inpatient Hospital Stay: Payer: Medicare HMO

## 2021-04-05 ENCOUNTER — Ambulatory Visit
Admission: RE | Admit: 2021-04-05 | Discharge: 2021-04-05 | Disposition: A | Discharge status: Home / Self Care | Payer: Medicare HMO | Source: Ambulatory Visit | Attending: Radiation Oncology | Admitting: Radiation Oncology

## 2021-04-05 ENCOUNTER — Inpatient Hospital Stay: Payer: Medicare HMO | Admitting: Hospice and Palliative Medicine

## 2021-04-05 DIAGNOSIS — Z510 Encounter for antineoplastic radiation therapy: Secondary | ICD-10-CM | POA: Diagnosis not present

## 2021-04-05 DIAGNOSIS — C7951 Secondary malignant neoplasm of bone: Secondary | ICD-10-CM | POA: Diagnosis not present

## 2021-04-07 ENCOUNTER — Ambulatory Visit: Payer: Medicare HMO | Admitting: Radiation Oncology

## 2021-04-10 ENCOUNTER — Ambulatory Visit
Admission: RE | Admit: 2021-04-10 | Discharge: 2021-04-10 | Disposition: A | Discharge status: Home / Self Care | Payer: Medicare HMO | Source: Ambulatory Visit | Attending: Radiation Oncology | Admitting: Radiation Oncology

## 2021-04-10 DIAGNOSIS — Z510 Encounter for antineoplastic radiation therapy: Secondary | ICD-10-CM | POA: Diagnosis not present

## 2021-04-10 DIAGNOSIS — C7951 Secondary malignant neoplasm of bone: Secondary | ICD-10-CM | POA: Diagnosis not present

## 2021-04-12 ENCOUNTER — Ambulatory Visit: Payer: Medicare HMO

## 2021-04-13 ENCOUNTER — Ambulatory Visit
Admission: RE | Admit: 2021-04-13 | Discharge: 2021-04-13 | Disposition: A | Discharge status: Home / Self Care | Payer: Medicare HMO | Source: Ambulatory Visit | Attending: Radiation Oncology | Admitting: Radiation Oncology

## 2021-04-13 DIAGNOSIS — Z510 Encounter for antineoplastic radiation therapy: Secondary | ICD-10-CM | POA: Diagnosis not present

## 2021-04-13 DIAGNOSIS — C7951 Secondary malignant neoplasm of bone: Secondary | ICD-10-CM | POA: Diagnosis not present

## 2021-04-14 ENCOUNTER — Ambulatory Visit: Payer: Self-pay | Admitting: Urology

## 2021-04-18 ENCOUNTER — Ambulatory Visit
Admission: RE | Admit: 2021-04-18 | Discharge: 2021-04-18 | Disposition: A | Discharge status: Home / Self Care | Payer: Medicare HMO | Source: Ambulatory Visit | Attending: Radiation Oncology | Admitting: Radiation Oncology

## 2021-04-18 DIAGNOSIS — C7951 Secondary malignant neoplasm of bone: Secondary | ICD-10-CM | POA: Diagnosis not present

## 2021-04-18 DIAGNOSIS — Z510 Encounter for antineoplastic radiation therapy: Secondary | ICD-10-CM | POA: Insufficient documentation

## 2021-04-18 DIAGNOSIS — Z87891 Personal history of nicotine dependence: Secondary | ICD-10-CM | POA: Diagnosis not present

## 2021-04-18 DIAGNOSIS — C3412 Malignant neoplasm of upper lobe, left bronchus or lung: Secondary | ICD-10-CM | POA: Diagnosis not present

## 2021-04-19 ENCOUNTER — Ambulatory Visit: Payer: Medicare HMO

## 2021-05-10 ENCOUNTER — Telehealth: Payer: Self-pay | Admitting: Oncology

## 2021-05-10 NOTE — Telephone Encounter (Signed)
Patients wife called and states he usually sees [place] and [place] together.  The check out note for Gray[facility] O[place]ond said TBD however the progress note from 6/2 states RTC 3 months with repeat PET scan.  Wife would like to coordinate [place] and [place] appointments if possible.  Routing to clinical team for follow up.

## 2021-05-11 ENCOUNTER — Ambulatory Visit
Admission: RE | Admit: 2021-05-11 | Discharge: 2021-05-11 | Disposition: A | Discharge status: Home / Self Care | Payer: Medicare HMO | Source: Ambulatory Visit | Attending: Radiation Oncology | Admitting: Radiation Oncology

## 2021-05-11 ENCOUNTER — Encounter: Payer: Self-pay | Admitting: Radiation Oncology

## 2021-05-11 DIAGNOSIS — C3412 Malignant neoplasm of upper lobe, left bronchus or lung: Secondary | ICD-10-CM | POA: Insufficient documentation

## 2021-05-11 DIAGNOSIS — C7951 Secondary malignant neoplasm of bone: Secondary | ICD-10-CM | POA: Insufficient documentation

## 2021-05-11 DIAGNOSIS — Z923 Personal history of irradiation: Secondary | ICD-10-CM | POA: Diagnosis not present

## 2021-05-11 DIAGNOSIS — C3490 Malignant neoplasm of unspecified part of unspecified bronchus or lung: Secondary | ICD-10-CM

## 2021-05-11 NOTE — Progress Notes (Signed)
Radiation Oncology Follow up Note  Name: Scott Gross   Date:   05/11/2021 MRN:  [id number] DOB: [birth year]    This 87 y.o. male presents to the clinic today for 1 month follow-up status post SBRT to the T10 vertebral body with tumor encroaching on the spinal canal and patient with known stage IV non-small cell lung cancer.  REFERRING PROVIDER: [name], MD  HPI: Patient is an 87 year old male well-known to department having been treated multiple times for palliative radiation therapy for stage IV non-small cell lung cancer.  He is now 1 month out from SBRT to his T10 vertebral body seen today in routine follow-up he is having no side effects from that treatment.  Continues to have persistent pain in his left shoulder although this is been going on for years according to his wife.  He specifically denies any abdominal complaints diarrhea abdominal pain or discomfort at this time..  COMPLICATIONS OF TREATMENT: none  FOLLOW UP COMPLIANCE: keeps appointments   PHYSICAL EXAM:  BP (P) 106/66 (BP Location: Left Arm, Patient Position: Sitting)   Pulse (P) 75   Temp (!) (P) 96.1 F (35.6 C) (Tympanic)   Resp (P) 16   Wt (P) 131 lb 9.6 oz (59.7 kg)   BMI (P) 20.01 kg/m  Well-developed well-nourished patient in NAD. HEENT reveals PERLA, EOMI, discs not visualized.  Oral cavity is clear. No oral mucosal lesions are identified. Neck is clear without evidence of cervical or supraclavicular adenopathy. Lungs are clear to A&P. Cardiac examination is essentially unremarkable with regular rate and rhythm without murmur rub or thrill. Abdomen is benign with no organomegaly or masses noted. Motor sensory and DTR levels are equal and symmetric in the upper and lower extremities. Cranial nerves II through XII are grossly intact. Proprioception is intact. No peripheral adenopathy or edema is identified. No motor or sensory levels are noted. Crude visual fields are within normal range.  RADIOLOGY  RESULTS: No current films for review  PLAN: Present time patient is done extremely well with very low side effect profile from his SBRT to his spine.  On pleased with his overall progress.  He has a PET scan in October and I will see him shortly thereafter for any further recommendations.  Patient and family know to call with any concerns.  I would like to take this opportunity to thank you for allowing me to participate in the care of your patient.Noreene Filbert, MD

## 2021-05-30 DIAGNOSIS — H353111 Nonexudative age-related macular degeneration, right eye, early dry stage: Secondary | ICD-10-CM | POA: Diagnosis not present

## 2021-05-30 DIAGNOSIS — H35362 Drusen (degenerative) of macula, left eye: Secondary | ICD-10-CM | POA: Diagnosis not present

## 2021-05-30 DIAGNOSIS — Z961 Presence of intraocular lens: Secondary | ICD-10-CM | POA: Diagnosis not present

## 2021-05-30 DIAGNOSIS — H35363 Drusen (degenerative) of macula, bilateral: Secondary | ICD-10-CM | POA: Diagnosis not present

## 2021-06-06 ENCOUNTER — Inpatient Hospital Stay
Admit: 2021-06-06 | Discharge: 2021-06-06 | Disposition: A | Discharge status: Home / Self Care | Payer: Medicare HMO | Attending: Physician Assistant | Admitting: Physician Assistant

## 2021-06-06 ENCOUNTER — Other Ambulatory Visit: Payer: Self-pay

## 2021-06-06 ENCOUNTER — Emergency Department: Payer: Medicare HMO

## 2021-06-06 ENCOUNTER — Encounter: Payer: Self-pay | Admitting: *Deleted

## 2021-06-06 ENCOUNTER — Encounter
Admission: EM | Disposition: A | Discharge status: Home / Self Care | Payer: Self-pay | Source: Home / Self Care | Attending: Internal Medicine

## 2021-06-06 ENCOUNTER — Inpatient Hospital Stay
Admission: EM | Admit: 2021-06-06 | Discharge: 2021-06-08 | DRG: 246 | Disposition: A | Discharge status: Home / Self Care | Payer: Medicare HMO | Attending: Internal Medicine | Admitting: Internal Medicine

## 2021-06-06 DIAGNOSIS — Z974 Presence of external hearing-aid: Secondary | ICD-10-CM

## 2021-06-06 DIAGNOSIS — Z955 Presence of coronary angioplasty implant and graft: Secondary | ICD-10-CM | POA: Diagnosis not present

## 2021-06-06 DIAGNOSIS — R000 Tachycardia, unspecified: Secondary | ICD-10-CM | POA: Diagnosis not present

## 2021-06-06 DIAGNOSIS — C3490 Malignant neoplasm of unspecified part of unspecified bronchus or lung: Secondary | ICD-10-CM | POA: Diagnosis not present

## 2021-06-06 DIAGNOSIS — Z923 Personal history of irradiation: Secondary | ICD-10-CM

## 2021-06-06 DIAGNOSIS — Z8674 Personal history of sudden cardiac arrest: Secondary | ICD-10-CM | POA: Diagnosis not present

## 2021-06-06 DIAGNOSIS — I480 Paroxysmal atrial fibrillation: Secondary | ICD-10-CM | POA: Diagnosis not present

## 2021-06-06 DIAGNOSIS — Z79899 Other long term (current) drug therapy: Secondary | ICD-10-CM

## 2021-06-06 DIAGNOSIS — I499 Cardiac arrhythmia, unspecified: Secondary | ICD-10-CM | POA: Diagnosis not present

## 2021-06-06 DIAGNOSIS — Z9861 Coronary angioplasty status: Secondary | ICD-10-CM | POA: Diagnosis not present

## 2021-06-06 DIAGNOSIS — I5023 Acute on chronic systolic (congestive) heart failure: Secondary | ICD-10-CM | POA: Diagnosis present

## 2021-06-06 DIAGNOSIS — E876 Hypokalemia: Secondary | ICD-10-CM | POA: Diagnosis present

## 2021-06-06 DIAGNOSIS — Z85118 Personal history of other malignant neoplasm of bronchus and lung: Secondary | ICD-10-CM

## 2021-06-06 DIAGNOSIS — I2510 Atherosclerotic heart disease of native coronary artery without angina pectoris: Secondary | ICD-10-CM | POA: Diagnosis not present

## 2021-06-06 DIAGNOSIS — M25519 Pain in unspecified shoulder: Secondary | ICD-10-CM | POA: Diagnosis not present

## 2021-06-06 DIAGNOSIS — I5021 Acute systolic (congestive) heart failure: Secondary | ICD-10-CM | POA: Diagnosis not present

## 2021-06-06 DIAGNOSIS — Z888 Allergy status to other drugs, medicaments and biological substances status: Secondary | ICD-10-CM

## 2021-06-06 DIAGNOSIS — I2582 Chronic total occlusion of coronary artery: Secondary | ICD-10-CM | POA: Diagnosis present

## 2021-06-06 DIAGNOSIS — Z8249 Family history of ischemic heart disease and other diseases of the circulatory system: Secondary | ICD-10-CM

## 2021-06-06 DIAGNOSIS — E785 Hyperlipidemia, unspecified: Secondary | ICD-10-CM | POA: Diagnosis present

## 2021-06-06 DIAGNOSIS — I255 Ischemic cardiomyopathy: Secondary | ICD-10-CM | POA: Diagnosis not present

## 2021-06-06 DIAGNOSIS — Z7901 Long term (current) use of anticoagulants: Secondary | ICD-10-CM

## 2021-06-06 DIAGNOSIS — I213 ST elevation (STEMI) myocardial infarction of unspecified site: Secondary | ICD-10-CM

## 2021-06-06 DIAGNOSIS — Z20822 Contact with and (suspected) exposure to covid-19: Secondary | ICD-10-CM | POA: Diagnosis not present

## 2021-06-06 DIAGNOSIS — R52 Pain, unspecified: Secondary | ICD-10-CM | POA: Diagnosis not present

## 2021-06-06 DIAGNOSIS — I252 Old myocardial infarction: Secondary | ICD-10-CM | POA: Diagnosis not present

## 2021-06-06 DIAGNOSIS — H9192 Unspecified hearing loss, left ear: Secondary | ICD-10-CM | POA: Diagnosis present

## 2021-06-06 DIAGNOSIS — Z681 Body mass index (BMI) 19 or less, adult: Secondary | ICD-10-CM | POA: Diagnosis not present

## 2021-06-06 DIAGNOSIS — I4891 Unspecified atrial fibrillation: Secondary | ICD-10-CM | POA: Diagnosis not present

## 2021-06-06 DIAGNOSIS — E43 Unspecified severe protein-calorie malnutrition: Secondary | ICD-10-CM | POA: Diagnosis not present

## 2021-06-06 DIAGNOSIS — J449 Chronic obstructive pulmonary disease, unspecified: Secondary | ICD-10-CM | POA: Diagnosis present

## 2021-06-06 DIAGNOSIS — I5022 Chronic systolic (congestive) heart failure: Secondary | ICD-10-CM

## 2021-06-06 DIAGNOSIS — C7951 Secondary malignant neoplasm of bone: Secondary | ICD-10-CM | POA: Diagnosis not present

## 2021-06-06 DIAGNOSIS — E119 Type 2 diabetes mellitus without complications: Secondary | ICD-10-CM | POA: Diagnosis not present

## 2021-06-06 DIAGNOSIS — I110 Hypertensive heart disease with heart failure: Secondary | ICD-10-CM | POA: Diagnosis present

## 2021-06-06 DIAGNOSIS — M1990 Unspecified osteoarthritis, unspecified site: Secondary | ICD-10-CM | POA: Diagnosis present

## 2021-06-06 DIAGNOSIS — I2129 ST elevation (STEMI) myocardial infarction involving other sites: Principal | ICD-10-CM | POA: Diagnosis present

## 2021-06-06 DIAGNOSIS — Z87891 Personal history of nicotine dependence: Secondary | ICD-10-CM

## 2021-06-06 DIAGNOSIS — M25511 Pain in right shoulder: Secondary | ICD-10-CM | POA: Diagnosis not present

## 2021-06-06 DIAGNOSIS — R079 Chest pain, unspecified: Secondary | ICD-10-CM | POA: Diagnosis not present

## 2021-06-06 DIAGNOSIS — R531 Weakness: Secondary | ICD-10-CM | POA: Diagnosis not present

## 2021-06-06 DIAGNOSIS — R0602 Shortness of breath: Secondary | ICD-10-CM | POA: Diagnosis not present

## 2021-06-06 DIAGNOSIS — I2111 ST elevation (STEMI) myocardial infarction involving right coronary artery: Secondary | ICD-10-CM | POA: Diagnosis not present

## 2021-06-06 HISTORY — PX: CORONARY/GRAFT ACUTE MI REVASCULARIZATION: CATH118305

## 2021-06-06 HISTORY — PX: LEFT HEART CATH AND CORONARY ANGIOGRAPHY: CATH118249

## 2021-06-06 LAB — ECHOCARDIOGRAM COMPLETE
AR max vel: 2.01 cm2
AV Area VTI: 1.98 cm2
AV Area mean vel: 2.01 cm2
AV Mean grad: 2 mmHg
AV Peak grad: 4.4 mmHg
Ao pk vel: 1.05 m/s
Area-P 1/2: 4.49 cm2
Height: 69 in
MV VTI: 3.17 cm2
P 1/2 time: 416 msec
S' Lateral: 2.5 cm
Weight: 2074.09 oz

## 2021-06-06 LAB — COMPREHENSIVE METABOLIC PANEL
ALT: 12 U/L (ref 0–44)
AST: 21 U/L (ref 15–41)
Albumin: 4.4 g/dL (ref 3.5–5.0)
Alkaline Phosphatase: 99 U/L (ref 38–126)
Anion gap: 11 (ref 5–15)
BUN: 13 mg/dL (ref 8–23)
CO2: 29 mmol/L (ref 22–32)
Calcium: 8.9 mg/dL (ref 8.9–10.3)
Chloride: 92 mmol/L — ABNORMAL LOW (ref 98–111)
Creatinine, Ser: 0.8 mg/dL (ref 0.61–1.24)
GFR, Estimated: >60 mL/min (ref >60)
Glucose, Bld: 212 mg/dL — ABNORMAL HIGH (ref 70–99)
Potassium: 3.3 mmol/L — ABNORMAL LOW (ref 3.5–5.1)
Sodium: 132 mmol/L — ABNORMAL LOW (ref 135–145)
Total Bilirubin: 1.1 mg/dL (ref 0.3–1.2)
Total Protein: 7.6 g/dL (ref 6.5–8.1)

## 2021-06-06 LAB — CBC WITH DIFFERENTIAL/PLATELET
Abs Immature Granulocytes: 0.13 10*3/uL — ABNORMAL HIGH (ref 0.00–0.07)
Basophils Absolute: 0.1 10*3/uL (ref 0.0–0.1)
Basophils Relative: 0 %
Eosinophils Absolute: 0.2 10*3/uL (ref 0.0–0.5)
Eosinophils Relative: 1 %
HCT: 47.3 % (ref 39.0–52.0)
Hemoglobin: 16.8 g/dL (ref 13.0–17.0)
Immature Granulocytes: 1 %
Lymphocytes Relative: 11 %
Lymphs Abs: 1.8 10*3/uL (ref 0.7–4.0)
MCH: 31.1 pg (ref 26.0–34.0)
MCHC: 35.5 g/dL (ref 30.0–36.0)
MCV: 87.4 fL (ref 80.0–100.0)
Monocytes Absolute: 0.8 10*3/uL (ref 0.1–1.0)
Monocytes Relative: 5 %
Neutro Abs: 14.1 10*3/uL — ABNORMAL HIGH (ref 1.7–7.7)
Neutrophils Relative %: 82 %
Platelets: 217 10*3/uL (ref 150–400)
RBC: 5.41 MIL/uL (ref 4.22–5.81)
RDW: 12.9 % (ref 11.5–15.5)
WBC: 17.1 10*3/uL — ABNORMAL HIGH (ref 4.0–10.5)
nRBC: 0 % (ref 0.0–0.2)

## 2021-06-06 LAB — LIPID PANEL
Cholesterol: 176 mg/dL (ref 0–200)
HDL: 35 mg/dL — ABNORMAL LOW (ref >40)
LDL Cholesterol: 101 mg/dL — ABNORMAL HIGH (ref 0–99)
Total CHOL/HDL Ratio: 5 RATIO
Triglycerides: 202 mg/dL — ABNORMAL HIGH (ref <150)
VLDL: 40 mg/dL (ref 0–40)

## 2021-06-06 LAB — TROPONIN I (HIGH SENSITIVITY)
Troponin I (High Sensitivity): 58 ng/L — ABNORMAL HIGH (ref <18)
Troponin I (High Sensitivity): >24000 ng/L (ref <18)
Troponin I (High Sensitivity): >24000 ng/L (ref <18)

## 2021-06-06 LAB — CBC
HCT: 43.1 % (ref 39.0–52.0)
Hemoglobin: 15.4 g/dL (ref 13.0–17.0)
MCH: 31.6 pg (ref 26.0–34.0)
MCHC: 35.7 g/dL (ref 30.0–36.0)
MCV: 88.3 fL (ref 80.0–100.0)
Platelets: 201 10*3/uL (ref 150–400)
RBC: 4.88 MIL/uL (ref 4.22–5.81)
RDW: 13 % (ref 11.5–15.5)
WBC: 13.5 10*3/uL — ABNORMAL HIGH (ref 4.0–10.5)
nRBC: 0 % (ref 0.0–0.2)

## 2021-06-06 LAB — BASIC METABOLIC PANEL
Anion gap: 11 (ref 5–15)
BUN: 10 mg/dL (ref 8–23)
CO2: 24 mmol/L (ref 22–32)
Calcium: 8.5 mg/dL — ABNORMAL LOW (ref 8.9–10.3)
Chloride: 99 mmol/L (ref 98–111)
Creatinine, Ser: 0.71 mg/dL (ref 0.61–1.24)
GFR, Estimated: >60 mL/min (ref >60)
Glucose, Bld: 143 mg/dL — ABNORMAL HIGH (ref 70–99)
Potassium: 3.5 mmol/L (ref 3.5–5.1)
Sodium: 134 mmol/L — ABNORMAL LOW (ref 135–145)

## 2021-06-06 LAB — RESP PANEL BY RT-PCR (FLU A&B, COVID) ARPGX2
Influenza A by PCR: NEGATIVE
Influenza B by PCR: NEGATIVE
SARS Coronavirus 2 by RT PCR: NEGATIVE

## 2021-06-06 LAB — HEMOGLOBIN A1C
Hgb A1c MFr Bld: 6.2 % — ABNORMAL HIGH (ref 4.8–5.6)
Mean Plasma Glucose: 131 mg/dL

## 2021-06-06 LAB — BRAIN NATRIURETIC PEPTIDE: B Natriuretic Peptide: 125.9 pg/mL — ABNORMAL HIGH (ref 0.0–100.0)

## 2021-06-06 LAB — GLUCOSE, CAPILLARY
Glucose-Capillary: 187 mg/dL — ABNORMAL HIGH (ref 70–99)
Glucose-Capillary: 145 mg/dL — ABNORMAL HIGH (ref 70–99)
Glucose-Capillary: 168 mg/dL — ABNORMAL HIGH (ref 70–99)
Glucose-Capillary: 148 mg/dL — ABNORMAL HIGH (ref 70–99)
Glucose-Capillary: 167 mg/dL — ABNORMAL HIGH (ref 70–99)

## 2021-06-06 LAB — APTT: aPTT: 31 seconds (ref 24–36)

## 2021-06-06 LAB — MRSA NEXT GEN BY PCR, NASAL: MRSA by PCR Next Gen: NOT DETECTED

## 2021-06-06 LAB — POCT ACTIVATED CLOTTING TIME: Activated Clotting Time: 480 seconds

## 2021-06-06 LAB — PROTIME-INR
INR: 1 (ref 0.8–1.2)
Prothrombin Time: 13.2 seconds (ref 11.4–15.2)

## 2021-06-06 SURGERY — CORONARY/GRAFT ACUTE MI REVASCULARIZATION
Anesthesia: Moderate Sedation

## 2021-06-06 MED ORDER — FENTANYL CITRATE (PF) 100 MCG/2ML IJ SOLN
INTRAMUSCULAR | Status: DC | PRN
  Administered 2021-06-06: 25 ug via INTRAVENOUS

## 2021-06-06 MED ORDER — MIDAZOLAM HCL 2 MG/2ML IJ SOLN
INTRAMUSCULAR | Status: AC
  Filled 2021-06-06: qty 2

## 2021-06-06 MED ORDER — SODIUM CHLORIDE 0.9 % IV SOLN: INTRAVENOUS | Status: AC

## 2021-06-06 MED ORDER — INSULIN ASPART 100 UNIT/ML IJ SOLN
0.0000 [IU] | Freq: Three times a day (TID) | INTRAMUSCULAR | Status: DC
  Administered 2021-06-06: 1 [IU] via SUBCUTANEOUS
  Administered 2021-06-06: 2 [IU] via SUBCUTANEOUS
  Administered 2021-06-06: 1 [IU] via SUBCUTANEOUS
  Administered 2021-06-07: 2 [IU] via SUBCUTANEOUS
  Administered 2021-06-07 – 2021-06-08 (×2): 1 [IU] via SUBCUTANEOUS
  Filled 2021-06-06 (×6): qty 1

## 2021-06-06 MED ORDER — ASPIRIN 81 MG PO CHEW
81.0000 mg | CHEWABLE_TABLET | Freq: Every day | ORAL | Status: DC
  Administered 2021-06-06 – 2021-06-07 (×2): 81 mg via ORAL
  Filled 2021-06-06 (×2): qty 1

## 2021-06-06 MED ORDER — FENTANYL CITRATE (PF) 100 MCG/2ML IJ SOLN
INTRAMUSCULAR | Status: AC
  Filled 2021-06-06: qty 2

## 2021-06-06 MED ORDER — INSULIN ASPART 100 UNIT/ML IJ SOLN: 0.0000 [IU] | Freq: Every day | INTRAMUSCULAR | Status: DC

## 2021-06-06 MED ORDER — HEPARIN SODIUM (PORCINE) 1000 UNIT/ML IJ SOLN
INTRAMUSCULAR | Status: DC | PRN
  Administered 2021-06-06: 3000 [IU] via INTRAVENOUS

## 2021-06-06 MED ORDER — LIDOCAINE HCL 1 % IJ SOLN
INTRAMUSCULAR | Status: AC
  Filled 2021-06-06: qty 20

## 2021-06-06 MED ORDER — ROSUVASTATIN CALCIUM 20 MG PO TABS
20.0000 mg | ORAL_TABLET | Freq: Every day | ORAL | Status: DC
  Administered 2021-06-06 – 2021-06-07 (×2): 20 mg via ORAL
  Filled 2021-06-06: qty 2
  Filled 2021-06-06: qty 1
  Filled 2021-06-06: qty 2
  Filled 2021-06-06: qty 1
  Filled 2021-06-06: qty 2
  Filled 2021-06-06: qty 1

## 2021-06-06 MED ORDER — HEPARIN SODIUM (PORCINE) 1000 UNIT/ML IJ SOLN
INTRAMUSCULAR | Status: AC
  Filled 2021-06-06: qty 1

## 2021-06-06 MED ORDER — ONDANSETRON HCL 4 MG/2ML IJ SOLN
4.0000 mg | Freq: Four times a day (QID) | INTRAMUSCULAR | Status: DC | PRN
  Administered 2021-06-06: 4 mg via INTRAVENOUS
  Filled 2021-06-06: qty 2

## 2021-06-06 MED ORDER — ENSURE ENLIVE PO LIQD
237.0000 mL | Freq: Three times a day (TID) | ORAL | Status: DC
  Administered 2021-06-06 – 2021-06-07 (×2): 237 mL via ORAL

## 2021-06-06 MED ORDER — SODIUM CHLORIDE 0.9% FLUSH: 3.0000 mL | INTRAVENOUS | Status: DC | PRN

## 2021-06-06 MED ORDER — LIDOCAINE HCL (PF) 1 % IJ SOLN
INTRAMUSCULAR | Status: DC | PRN
  Administered 2021-06-06: 2 mL

## 2021-06-06 MED ORDER — HEPARIN SODIUM (PORCINE) 5000 UNIT/ML IJ SOLN
60.0000 [IU]/kg | Freq: Once | INTRAMUSCULAR | Status: AC
  Administered 2021-06-06: 3550 [IU] via INTRAVENOUS
  Filled 2021-06-06: qty 1

## 2021-06-06 MED ORDER — SODIUM CHLORIDE 0.9 % IV SOLN: 250.0000 mL | INTRAVENOUS | Status: DC | PRN

## 2021-06-06 MED ORDER — TICAGRELOR 90 MG PO TABS
90.0000 mg | ORAL_TABLET | Freq: Two times a day (BID) | ORAL | Status: DC
  Administered 2021-06-06 – 2021-06-07 (×4): 90 mg via ORAL
  Filled 2021-06-06 (×4): qty 1

## 2021-06-06 MED ORDER — MIDAZOLAM HCL 2 MG/2ML IJ SOLN
INTRAMUSCULAR | Status: DC | PRN
  Administered 2021-06-06: 1 mg via INTRAVENOUS

## 2021-06-06 MED ORDER — ACETAMINOPHEN 650 MG RE SUPP: 650.0000 mg | Freq: Four times a day (QID) | RECTAL | Status: DC | PRN

## 2021-06-06 MED ORDER — POTASSIUM CHLORIDE CRYS ER 20 MEQ PO TBCR
40.0000 meq | EXTENDED_RELEASE_TABLET | Freq: Once | ORAL | Status: AC
  Administered 2021-06-06: 40 meq via ORAL
  Filled 2021-06-06: qty 2

## 2021-06-06 MED ORDER — VERAPAMIL HCL 2.5 MG/ML IV SOLN
INTRAVENOUS | Status: AC
  Filled 2021-06-06: qty 2

## 2021-06-06 MED ORDER — ADULT MULTIVITAMIN W/MINERALS CH
1.0000 | ORAL_TABLET | Freq: Every day | ORAL | Status: DC
  Administered 2021-06-06 – 2021-06-07 (×2): 1 via ORAL
  Filled 2021-06-06 (×2): qty 1

## 2021-06-06 MED ORDER — METOPROLOL SUCCINATE ER 50 MG PO TB24
50.0000 mg | ORAL_TABLET | Freq: Every day | ORAL | Status: DC
  Administered 2021-06-06 – 2021-06-07 (×2): 50 mg via ORAL
  Filled 2021-06-06 (×2): qty 1

## 2021-06-06 MED ORDER — SODIUM CHLORIDE 0.9 % IV SOLN: INTRAVENOUS | Status: DC

## 2021-06-06 MED ORDER — HEPARIN (PORCINE) IN NACL 1000-0.9 UT/500ML-% IV SOLN
INTRAVENOUS | Status: AC
  Filled 2021-06-06: qty 1000

## 2021-06-06 MED ORDER — VERAPAMIL HCL 2.5 MG/ML IV SOLN
INTRAVENOUS | Status: DC | PRN
  Administered 2021-06-06: 2.5 mg via INTRA_ARTERIAL

## 2021-06-06 MED ORDER — ACETAMINOPHEN 325 MG PO TABS: 650.0000 mg | ORAL_TABLET | Freq: Four times a day (QID) | ORAL | Status: DC | PRN

## 2021-06-06 MED ORDER — ONDANSETRON HCL 4 MG PO TABS: 4.0000 mg | ORAL_TABLET | Freq: Four times a day (QID) | ORAL | Status: DC | PRN

## 2021-06-06 MED ORDER — SODIUM CHLORIDE 0.9% FLUSH
3.0000 mL | Freq: Two times a day (BID) | INTRAVENOUS | Status: DC
  Administered 2021-06-06 – 2021-06-07 (×3): 3 mL via INTRAVENOUS

## 2021-06-06 MED ORDER — CHLORHEXIDINE GLUCONATE CLOTH 2 % EX PADS
6.0000 | MEDICATED_PAD | Freq: Every day | CUTANEOUS | Status: DC
  Administered 2021-06-06 – 2021-06-08 (×2): 6 via TOPICAL

## 2021-06-06 MED ORDER — TICAGRELOR 90 MG PO TABS
ORAL_TABLET | ORAL | Status: DC | PRN
  Administered 2021-06-06: 180 mg via ORAL

## 2021-06-06 MED ORDER — TICAGRELOR 90 MG PO TABS
ORAL_TABLET | ORAL | Status: AC
  Filled 2021-06-06: qty 2

## 2021-06-06 MED ORDER — HEPARIN (PORCINE) IN NACL 1000-0.9 UT/500ML-% IV SOLN
INTRAVENOUS | Status: DC | PRN
Start: 2021-06-06 — End: 2021-06-06
  Administered 2021-06-06 (×2): 500 mL

## 2021-06-06 MED ORDER — ONDANSETRON HCL 4 MG/2ML IJ SOLN: 4.0000 mg | Freq: Four times a day (QID) | INTRAMUSCULAR | Status: DC | PRN

## 2021-06-06 MED ORDER — PANTOPRAZOLE SODIUM 40 MG PO TBEC
40.0000 mg | DELAYED_RELEASE_TABLET | Freq: Every day | ORAL | Status: DC
  Administered 2021-06-06 – 2021-06-07 (×2): 40 mg via ORAL
  Filled 2021-06-06 (×2): qty 1

## 2021-06-06 MED ORDER — ENOXAPARIN SODIUM 40 MG/0.4ML IJ SOSY
40.0000 mg | PREFILLED_SYRINGE | INTRAMUSCULAR | Status: DC
  Administered 2021-06-06 – 2021-06-08 (×3): 40 mg via SUBCUTANEOUS
  Filled 2021-06-06 (×3): qty 0.4

## 2021-06-06 MED ORDER — IOHEXOL 300 MG/ML  SOLN
INTRAMUSCULAR | Status: DC | PRN
  Administered 2021-06-06: 108 mL

## 2021-06-06 MED ORDER — ACETAMINOPHEN 325 MG PO TABS: 650.0000 mg | ORAL_TABLET | ORAL | Status: DC | PRN

## 2021-06-06 SURGICAL SUPPLY — 20 items
BALLN TREK RX 2.5X12 (BALLOONS) ×3
BALLN ~~LOC~~ TREK RX 2.75X12 (BALLOONS) ×3
BALLOON TREK RX 2.5X12 (BALLOONS) ×1 IMPLANT
BALLOON ~~LOC~~ TREK RX 2.75X12 (BALLOONS) ×1 IMPLANT
CATH INFINITI JR4 5F (CATHETERS) ×3 IMPLANT
CATH LAUNCHER 6FR EBU3.5 (CATHETERS) ×3 IMPLANT
DEVICE RAD COMP TR BAND LRG (VASCULAR PRODUCTS) ×3 IMPLANT
DRAPE BRACHIAL (DRAPES) ×3 IMPLANT
GLIDESHEATH SLEND SS 6F .021 (SHEATH) ×3 IMPLANT
GUIDEWIRE INQWIRE 1.5J.035X260 (WIRE) ×1 IMPLANT
INQWIRE 1.5J .035X260CM (WIRE) ×3
KIT ENCORE 26 ADVANTAGE (KITS) ×3 IMPLANT
KIT SYRINGE INJ CVI SPIKEX1 (MISCELLANEOUS) ×3 IMPLANT
PACK CARDIAC CATH (CUSTOM PROCEDURE TRAY) ×3 IMPLANT
PROTECTION STATION PRESSURIZED (MISCELLANEOUS) ×3
SET ATX SIMPLICITY (MISCELLANEOUS) ×3 IMPLANT
STATION PROTECTION PRESSURIZED (MISCELLANEOUS) ×1 IMPLANT
STENT ONYX FRONTIER 2.75X18 (Permanent Stent) ×3 IMPLANT
TUBING CIL FLEX 10 FLL-RA (TUBING) ×3 IMPLANT
WIRE RUNTHROUGH .014X180CM (WIRE) ×3 IMPLANT

## 2021-06-06 NOTE — Consult Note (Signed)
Cardiology Consultation:   Patient ID: Scott Gross MRN: [id number]; DOB: [birth year]  Admit date: 06/06/2021 Date of Consult: 06/06/2021  PCP:  [name], MD   [facility] Providers Cardiologist: Dr. Clayborn Bigness   Patient Profile:   Scott Gross is a 87 y.o. male with a hx of coronary artery disease status post remote LAD stent and paroxysmal atrial fibrillation who is being seen 06/06/2021 for the evaluation of lateral ST elevation myocardial infarction at the request of Dr. Leonides Schanz.  History of Present Illness:   Scott Gross is an 87 year old male with known history of coronary artery disease status post anterior STEMI in 1696 complicated by ventricular fibrillation arrest treated successfully with PCI and drug-eluting stent placement to the mid LAD, paroxysmal atrial fibrillation on long-term anticoagulation with Eliquis, hyperlipidemia with intolerance to statins, adenocarcinoma of the lung with bony metastasis status post radiation therapy and essential hypertension. He presented with acute onset of discomfort in the back between his shoulder blades that was intense initially but then improved with aspirin and nitroglycerin.  EKG by EMS showed evidence of lateral ST elevation and thus a code STEMI was activated from the field.  The patient had persistent EKG changes in the ED and was still having chest pain.  He was given unfractionated heparin and aspirin.  He usually takes Eliquis once daily only and his last dose was yesterday morning. Given the patient's presentation and EKG changes, I recommended proceeding with emergent cardiac catheterization possible PCI.   Past Medical History:  Diagnosis Date   Arthritis    CAD (coronary artery disease) Nov. 12, 2012   Inferior ST elevation MI in November of 2012 with ventricular fibrillation arrest. Status post drug-eluting stent placement to the mid LAD. Most recent cardiac catheterization in June of this year showed patent stent  with minimal restenosis, 75% proximal LAD and 80% distal LAD which were unchanged from before. Mild disease in the left circumflex and moderate RCA disease. Ejection fraction was 55%.   Cancer of upper lobe of left lung ([place]) 11/2016   Rad tx's.   Cardiac arrest [facility]) Nov. 2012   x 2    Cardiac arrest - ventricular fibrillation    HOH (hard of hearing)    Left Hearing Aid   Hyperlipidemia    Hypertension    Post PTCA 2013   x2   ST elevation MI (STEMI) ([place]) 08/26/2011    Past Surgical History:  Procedure Laterality Date   CARDIAC CATHETERIZATION  2013   @ [place]; [place] s/p stent    CORONARY ANGIOPLASTY     ELECTROMAGNETIC NAVIGATION BROCHOSCOPY N/A 11/27/2016   Procedure: ELECTROMAGNETIC NAVIGATION BRONCHOSCOPY;  Surgeon: [name], MD;  Location: [facility] ORS;  Service: Cardiopulmonary;  Laterality: N/A;     Home Medications:  Prior to Admission medications   Medication Sig Start Date End Date Taking? Authorizing Provider  albuterol (VENTOLIN HFA) 108 (90 Base) MCG/ACT inhaler Inhale into the lungs every 6 (six) hours as needed. 01/24/21   [provider]  amLODipine (NORVASC) 5 MG tablet Take 5 mg by mouth 2 (two) times daily as needed.     [provider]  benzonatate (TESSALON) 200 MG capsule Take by mouth 3 (three) times daily as needed. Patient not taking: Reported on 05/11/2021 03/07/21   [provider]  chlorpheniramine-HYDROcodone ([place]) 10-8 MG/5ML SUER Take by mouth. Patient not taking: Reported on 05/11/2021 03/07/21   [provider]  ELIQUIS 5 MG TABS tablet 5 mg 2 (two) times  daily. 12/22/20   [provider]  lisinopril-hydrochlorothiazide (ZESTORETIC) 20-12.5 MG tablet Take 1 tablet by mouth daily.    [provider]  losartan-hydrochlorothiazide (HYZAAR) 50-12.5 MG tablet Take 1 tablet by mouth daily. 10/10/20   [provider]  metoprolol succinate (TOPROL-XL) 50 MG 24 hr tablet Take 50 mg by mouth  daily. 05/30/19   [provider]    Inpatient Medications: Scheduled Meds:  aspirin  81 mg Oral Daily   Chlorhexidine Gluconate Cloth  6 each Topical Q0600   metoprolol succinate  50 mg Oral Daily   potassium chloride  40 mEq Oral Once   rosuvastatin  20 mg Oral Daily   sodium chloride flush  3 mL Intravenous Q12H   ticagrelor  90 mg Oral BID   Continuous Infusions:  sodium chloride 10 mL/hr at 06/06/21 0045   sodium chloride     sodium chloride     PRN Meds: sodium chloride, acetaminophen, ondansetron (ZOFRAN) IV, sodium chloride flush  Allergies:    Allergies  Allergen Reactions   Statins Other (See Comments)    Arthralagia    Social History:   Social History   Socioeconomic History   Marital status: Married    Spouse name: Not on file   Number of children: Not on file   Years of education: Not on file   Highest education level: Not on file  Occupational History   Not on file  Tobacco Use   Smoking status: Former    Packs/day: 1.00    Types: Cigarettes    Quit date: 08/27/2011    Years since quitting: 9.7   Smokeless tobacco: Never  Vaping Use   Vaping Use: Never used  Substance and Sexual Activity   Alcohol use: No   Drug use: No   Sexual activity: Not Currently  Other Topics Concern   Not on file  Social History Narrative   Not on file   Social Determinants of Health   Financial Resource Strain: Not on file  Food Insecurity: Not on file  Transportation Needs: Not on file  Physical Activity: Not on file  Stress: Not on file  Social Connections: Not on file  Intimate Partner Violence: Not on file    Family History:    Family History  Problem Relation Age of Onset   Heart attack Mother    Heart attack Father      ROS:  Please see the history of present illness.   All other ROS reviewed and negative.     Physical Exam/Data:   Vitals:   06/06/21 0045 06/06/21 0050 06/06/21 0107 06/06/21 0215  BP:  122/85  120/68  Pulse: 88  86  80  Resp: 14 16  18   Temp:    97.8 F (36.6 C)  TempSrc:    Oral  SpO2: 96% 97% 96% 97%  Weight:    58.8 kg  Height:    5\' 9"  (1.753 m)   No intake or output data in the 24 hours ending 06/06/21 0233 Last 3 Weights 06/06/2021 06/06/2021 05/11/2021  Weight (lbs) 129 lb 10.1 oz 131 lb 131 lb 9.6 oz  Weight (kg) 58.8 kg 59.421 kg 59.693 kg     Body mass index is 19.14 kg/m.  General:  Well nourished, well developed, in no acute distress HEENT: normal Lymph: no adenopathy Neck: no JVD Endocrine:  No thryomegaly Vascular: No carotid bruits; FA pulses 2+ bilaterally without bruits  Cardiac:  normal S1, S2; RRR; no murmur  Lungs:  clear to auscultation bilaterally, no wheezing, rhonchi or rales  Abd: soft, nontender, no hepatomegaly  Ext: no edema Musculoskeletal:  No deformities, BUE and BLE strength normal and equal Skin: warm and dry  Neuro:  CNs 2-12 intact, no focal abnormalities noted Psych:  Normal affect   EKG:  The EKG was personally reviewed and demonstrates: Normal sinus rhythm with 1 to 2 mm of ST elevation in 1 and aVL with reciprocal ST depression in the inferior leads. Telemetry:  Telemetry was personally reviewed and demonstrates:    Relevant CV Studies:   Laboratory Data:  High Sensitivity Troponin:   Recent Labs  Lab 06/06/21 0033  TROPONINIHS 58*     Chemistry Recent Labs  Lab 06/06/21 0033  NA 132*  K 3.3*  CL 92*  CO2 29  GLUCOSE 212*  BUN 13  CREATININE 0.80  CALCIUM 8.9  GFRNONAA >60  ANIONGAP 11    Recent Labs  Lab 06/06/21 0033  PROT 7.6  ALBUMIN 4.4  AST 21  ALT 12  ALKPHOS 99  BILITOT 1.1   Hematology Recent Labs  Lab 06/06/21 0033  WBC 17.1*  RBC 5.41  HGB 16.8  HCT 47.3  MCV 87.4  MCH 31.1  MCHC 35.5  RDW 12.9  PLT 217   BNP Recent Labs  Lab 06/06/21 0033  BNP 125.9*    DDimer No results for input(s): DDIMER in the last 168 hours.   Radiology/Studies:  CARDIAC CATHETERIZATION  Result Date:  06/06/2021   Prox RCA lesion is 60% stenosed.   Prox RCA to Mid RCA lesion is 50% stenosed.   Dist RCA lesion is 30% stenosed.   Mid LAD-1 lesion is 10% stenosed.   Mid LAD-2 lesion is 60% stenosed.   Prox LAD to Mid LAD lesion is 70% stenosed.   Prox Cx to Mid Cx lesion is 100% stenosed.   A drug-eluting stent was successfully placed using a STENT ONYX FRONTIER [id number].   Post intervention, there is a 0% residual stenosis.   There is mild left ventricular systolic dysfunction.   LV end diastolic pressure is moderately elevated.   The left ventricular ejection fraction is 45-50% by visual estimate. 1.  Significant underlying three-vessel coronary artery disease.  The culprit for lateral STEMI is an occluded proximal left circumflex.  Patent stent in the mid LAD with 70% stenosis in the proximal LAD and 60% disease in the mid LAD distal to the previously placed stent.  In addition, there is borderline disease in the right coronary artery. 2.  Mildly reduced LV systolic function with apical lateral hypokinesis.  Moderately elevated left ventricular end-diastolic pressure. 3.  Successful angioplasty and drug-eluting stent placement to the left circumflex. Recommendations: Treat with dual antiplatelet therapy with aspirin and ticagrelor for now.  Eliquis can be resumed in 1 to 2 days for paroxysmal atrial fibrillation.  Once Eliquis is resumed, recommend stopping aspirin to minimize the risk of bleeding on triple therapy. The rest of his coronary artery disease can likely be treated medically but if he has refractory anginal symptoms, PCI of the proximal LAD can be considered.  DG Chest Port 1 View  Result Date: 06/06/2021 CLINICAL DATA:  Chest pain, right shoulder pain EXAM: PORTABLE CHEST 1 VIEW COMPARISON:  10/04/2020 FINDINGS: Left suprahilar mass adjacent to the aortic knob with fiducial markers is again identified and is stable since prior examination. Left-sided volume loss is unchanged. No superimposed focal  pulmonary infiltrate. No pneumothorax or pleural effusion. Cardiac  size within normal limits. Pulmonary vascularity is normal. No acute bone abnormality. IMPRESSION: No active disease. Stable appearance of left suprahilar mass and associated left-sided volume loss. Electronically Signed   By: Fidela Salisbury M.D.   On: 06/06/2021 01:00     Assessment and Plan:   Lateral ST elevation myocardial infarction: Given the patient's symptoms and EKG changes, I have recommended proceeding with emergent cardiac catheterization and possible PCI.  I discussed the procedure in details as well as risks and benefits.  I explained that increased risk of bleeding given that he is on anticoagulation and Eliquis but his last dose was yesterday morning.  Further recommendations to follow after cardiac catheterization. Paroxysmal atrial fibrillation: Currently in sinus rhythm.  Hold Eliquis for now and this can likely be resumed before hospital discharge.  Once Eliquis is resumed, aspirin can be discontinued to minimize the risk of bleeding while he is on ticagrelor. History of chronic systolic heart failure due to ischemic cardiomyopathy.  I requested an echocardiogram. Hyperlipidemia with history of intolerance to statins, will try using rosuvastatin to see if he can tolerate. History of metastatic lung cancer: Seems to have responded well to radiation therapy.  He is followed by oncology.  Please notify Dr. Clayborn Bigness of the patient's admission in the morning so he can round on the patient.    Risk Assessment/Risk Scores:   TIMI Risk Score for ST  Elevation MI:   The patient's TIMI risk score is 5, which indicates a 12.4% risk of all cause mortality at 30 days. {   For questions or updates, please contact [place] Please consult [email or web address] for contact info under    Signed, Kathlyn Sacramento, MD  06/06/2021 2:33 AM

## 2021-06-06 NOTE — Progress Notes (Signed)
[facility]    SUBJECTIVE: Patient currently pain-free still has some mild oozing from his right wrist no shortness of breath has not ambulated yet feels reasonably well   Vitals:   06/06/21 0500 06/06/21 0600 06/06/21 0700 06/06/21 0757  BP: 124/71 121/62 (!) 115/91   Pulse: 81 81 82 85  Resp: 14 20 11 20   Temp:    99.1 F (37.3 C)  TempSrc:    Oral  SpO2: 97% 97% 97% 98%  Weight:      Height:         Intake/Output Summary (Last 24 hours) at 06/06/2021 0826 Last data filed at 06/06/2021 0736 Gross per 24 hour  Intake 34.63 ml  Output 850 ml  Net -815.37 ml      PHYSICAL EXAM  General: Well developed, well nourished, in no acute distress HEENT:  Normocephalic and atramatic Neck:  No JVD.  Lungs: Clear bilaterally to auscultation and percussion. Heart: HRRR . Normal S1 and S2 without gallops or murmurs.  Abdomen: Bowel sounds are positive, abdomen soft and non-tender  Msk:  Back normal, normal gait. Normal strength and tone for age. Extremities: No clubbing, cyanosis or edema.  Right with oozing from access site TR band still in place Neuro: Alert and oriented X 3. Psych:  Good affect, responds appropriately   LABS: Basic Metabolic Panel: Recent Labs    06/06/21 0033 06/06/21 0534  NA 132* 134*  K 3.3* 3.5  CL 92* 99  CO2 29 24  GLUCOSE 212* 143*  BUN 13 10  CREATININE 0.80 0.71  CALCIUM 8.9 8.5*   Liver Function Tests: Recent Labs    06/06/21 0033  AST 21  ALT 12  ALKPHOS 99  BILITOT 1.1  PROT 7.6  ALBUMIN 4.4   No results for input(s): LIPASE, AMYLASE in the last 72 hours. CBC: Recent Labs    06/06/21 0033 06/06/21 0534  WBC 17.1* 13.5*  NEUTROABS 14.1*  --   HGB 16.8 15.4  HCT 47.3 43.1  MCV 87.4 88.3  PLT 217 201   Cardiac Enzymes: No results for input(s): CKTOTAL, CKMB, CKMBINDEX, TROPONINI in the last 72 hours. BNP: Invalid input(s): POCBNP D-Dimer: No results for input(s): DDIMER in the last 72 hours. Hemoglobin A1C: No  results for input(s): HGBA1C in the last 72 hours. Fasting Lipid Panel: Recent Labs    06/06/21 0033  CHOL 176  HDL 35*  LDLCALC 101*  TRIG 202*  CHOLHDL 5.0   Thyroid Function Tests: No results for input(s): TSH, T4TOTAL, T3FREE, THYROIDAB in the last 72 hours.  Invalid input(s): FREET3 Anemia Panel: No results for input(s): VITAMINB12, FOLATE, FERRITIN, TIBC, IRON, RETICCTPCT in the last 72 hours.  CARDIAC CATHETERIZATION  Result Date: 06/06/2021   Prox RCA lesion is 60% stenosed.   Prox RCA to Mid RCA lesion is 50% stenosed.   Dist RCA lesion is 30% stenosed.   Mid LAD-1 lesion is 10% stenosed.   Mid LAD-2 lesion is 60% stenosed.   Prox LAD to Mid LAD lesion is 70% stenosed.   Prox Cx to Mid Cx lesion is 100% stenosed.   A drug-eluting stent was successfully placed using a STENT ONYX FRONTIER [id number].   Post intervention, there is a 0% residual stenosis.   There is mild left ventricular systolic dysfunction.   LV end diastolic pressure is moderately elevated.   The left ventricular ejection fraction is 45-50% by visual estimate. 1.  Significant underlying three-vessel coronary artery disease.  The culprit for lateral  STEMI is an occluded proximal left circumflex.  Patent stent in the mid LAD with 70% stenosis in the proximal LAD and 60% disease in the mid LAD distal to the previously placed stent.  In addition, there is borderline disease in the right coronary artery. 2.  Mildly reduced LV systolic function with apical lateral hypokinesis.  Moderately elevated left ventricular end-diastolic pressure. 3.  Successful angioplasty and drug-eluting stent placement to the left circumflex. Recommendations: Treat with dual antiplatelet therapy with aspirin and ticagrelor for now.  Eliquis can be resumed in 1 to 2 days for paroxysmal atrial fibrillation.  Once Eliquis is resumed, recommend stopping aspirin to minimize the risk of bleeding on triple therapy. The rest of his coronary artery disease can  likely be treated medically but if he has refractory anginal symptoms, PCI of the proximal LAD can be considered.  DG Chest Port 1 View  Result Date: 06/06/2021 CLINICAL DATA:  Chest pain, right shoulder pain EXAM: PORTABLE CHEST 1 VIEW COMPARISON:  10/04/2020 FINDINGS: Left suprahilar mass adjacent to the aortic knob with fiducial markers is again identified and is stable since prior examination. Left-sided volume loss is unchanged. No superimposed focal pulmonary infiltrate. No pneumothorax or pleural effusion. Cardiac size within normal limits. Pulmonary vascularity is normal. No acute bone abnormality. IMPRESSION: No active disease. Stable appearance of left suprahilar mass and associated left-sided volume loss. Electronically Signed   By: Fidela Salisbury M.D.   On: 06/06/2021 01:00     Echo preserved left ventricular function with inferior lateral wall hypokinesis  TELEMETRY: NSR 75 nonspecific ST-T wave changes:  ASSESSMENT AND PLAN:  Principal Problem:   Acute ST elevation myocardial infarction (STEMI) of lateral wall (HCC) Active Problems:   Diabetes mellitus type 2, uncomplicated (HCC)   PAF (paroxysmal atrial fibrillation) (HCC)   Chronic anticoagulation S/p cardiac PCI and stent to circumflex Hypertension Lung cancer   Plan Agree with ICD therapy Brilinta and aspirin start Eliquis Continue amlodipine losartan metoprolol for blood pressure control Recommend statin therapy with Crestor 5 mg qd Inhalers as necessary for COPD lung disease Triple therapy for 30 days with Eliquis Brilinta Aspirin Continue diabetic management and control Elevated troponins over 24,000 continue to follow-up troponin Transfer to telemetry tomorrow        Yolonda Kida, MD 06/06/2021 8:26 AM

## 2021-06-06 NOTE — ED Provider Notes (Signed)
[facility] Emergency Department Provider Note  ____________________________________________   Event Date/Time   First MD Initiated Contact with Patient 06/06/21 [phone]     (approximate)  I have reviewed the triage vital signs and the nursing notes.   HISTORY  Chief Complaint No chief complaint on file.    HPI Scott Gross is a 87 y.o. male with history of CAD status post stents, V. fib cardiac arrest, hypertension, hyperlipidemia who presents to the emergency department with EMS with complaints of chest discomfort that he is unable to describe and right shoulder pain that started about 2 hours prior to arrival.  States he was at rest when this started.  Took a shower and did not improve.  States he had some nausea but no shortness of breath, vomiting, diaphoresis or dizziness.  States this did not feel like his previous heart attacks where he had significant chest pressure.  Given aspirin 324 mg and 1 spray of nitroglycerin with EMS.  States that the chest discomfort has resolved but he does not think it is from these medications.  States he is still having pain in the right shoulder.  Denies history of PE or DVT.  No lower extremity swelling or pain.  No fevers or cough.  Denies any injury to the right shoulder.  Reports his cardiologist is Dr. Clayborn Bigness.        Past Medical History:  Diagnosis Date   Arthritis    CAD (coronary artery disease) Nov. 12, 2012   Inferior ST elevation MI in November of 2012 with ventricular fibrillation arrest. Status post drug-eluting stent placement to the mid LAD. Most recent cardiac catheterization in June of this year showed patent stent with minimal restenosis, 75% proximal LAD and 80% distal LAD which were unchanged from before. Mild disease in the left circumflex and moderate RCA disease. Ejection fraction was 55%.   Cancer of upper lobe of left lung ([place]) 11/2016   Rad tx's.   Cardiac arrest [facility]) Nov. 2012   x 2     Cardiac arrest - ventricular fibrillation    HOH (hard of hearing)    Left Hearing Aid   Hyperlipidemia    Hypertension    Post PTCA 2013   x2   ST elevation MI (STEMI) ([place]) 08/26/2011    Patient Active Problem List   Diagnosis Date Noted   Malnutrition of moderate degree 06/22/2019   CAP (community acquired pneumonia) 06/21/2019   SIRS (systemic inflammatory response syndrome) ([place]) 05/23/2019   Atrial fibrillation with RVR (HCC)    PAF (paroxysmal atrial fibrillation) ([place]) 05/20/2019   Cardiac arrest ([place])    Diabetes mellitus type 2, uncomplicated ([place]) 38/25/0539   Angina pectoris ([place]) 12/17/2016   Adenopathy    Cancer of upper lobe of left lung ([place]) 09/10/2016   Hyperlipidemia    Hypertension    CAD (coronary artery disease) 08/27/2011   Myocardial infarction ([place]) 08/16/2011    Past Surgical History:  Procedure Laterality Date   CARDIAC CATHETERIZATION  2013   @ [place]; [place] s/p stent    CORONARY ANGIOPLASTY     ELECTROMAGNETIC NAVIGATION BROCHOSCOPY N/A 11/27/2016   Procedure: ELECTROMAGNETIC NAVIGATION BRONCHOSCOPY;  Surgeon: [name], MD;  Location: [facility] ORS;  Service: Cardiopulmonary;  Laterality: N/A;    Prior to Admission medications   Medication Sig Start Date End Date Taking? Authorizing Provider  albuterol (VENTOLIN HFA) 108 (90 Base) MCG/ACT inhaler Inhale into the lungs every 6 (six) hours as needed. 01/24/21  [provider]  amLODipine (NORVASC) 5 MG tablet Take 5 mg by mouth 2 (two) times daily as needed.     [provider]  benzonatate (TESSALON) 200 MG capsule Take by mouth 3 (three) times daily as needed. Patient not taking: Reported on 05/11/2021 03/07/21   [provider]  chlorpheniramine-HYDROcodone ([place]) 10-8 MG/5ML SUER Take by mouth. Patient not taking: Reported on 05/11/2021 03/07/21   [provider]  ELIQUIS 5 MG TABS tablet 5 mg 2 (two) times daily. 12/22/20   [provider]   lisinopril-hydrochlorothiazide (ZESTORETIC) 20-12.5 MG tablet Take 1 tablet by mouth daily.    [provider]  losartan-hydrochlorothiazide (HYZAAR) 50-12.5 MG tablet Take 1 tablet by mouth daily. 10/10/20   [provider]  metoprolol succinate (TOPROL-XL) 50 MG 24 hr tablet Take 50 mg by mouth daily. 05/30/19   [provider]    Allergies Statins  Family History  Problem Relation Age of Onset   Heart attack Mother    Heart attack Father     Social History Social History   Tobacco Use   Smoking status: Former    Packs/day: 1.00    Types: Cigarettes    Quit date: 08/27/2011    Years since quitting: 9.7   Smokeless tobacco: Never  Vaping Use   Vaping Use: Never used  Substance Use Topics   Alcohol use: No   Drug use: No    Review of Systems Constitutional: No fever. Eyes: No visual changes. ENT: No sore throat. Cardiovascular: + chest pain. Respiratory: Denies shortness of breath. Gastrointestinal: No nausea, vomiting, diarrhea. Genitourinary: Negative for dysuria. Musculoskeletal: Negative for back pain. Skin: Negative for rash. Neurological: Negative for focal weakness or numbness.  ____________________________________________   PHYSICAL EXAM:  VITAL SIGNS: Vitals:   06/06/21 0050 06/06/21 0107  BP: 122/85   Pulse: 86   Resp: 16   Temp:    SpO2: 97% 96%    CONSTITUTIONAL: Alert and oriented and responds appropriately to questions.  Elderly, in no distress HEAD: Normocephalic EYES: Conjunctivae clear, pupils appear equal, EOM appear intact ENT: normal nose; moist mucous membranes NECK: Supple, normal ROM CARD: RRR; S1 and S2 appreciated; no murmurs, no clicks, no rubs, no gallops RESP: Normal chest excursion without splinting or tachypnea; breath sounds clear and equal bilaterally; no wheezes, no rhonchi, no rales, no hypoxia or respiratory distress, speaking full sentences ABD/GI: Normal bowel sounds; non-distended; soft,  non-tender, no rebound, no guarding, no peritoneal signs, no hepatosplenomegaly BACK: The back appears normal EXT: Normal ROM in all joints; no deformity noted, no edema; no cyanosis, no calf tenderness or calf swelling, 2+ radial and DP pulses bilaterally; no pain or deformity noted to the right shoulder, no swelling or ecchymosis, no redness or warmth SKIN: Normal color for age and race; warm; no rash on exposed skin NEURO: Moves all extremities equally, normal speech, no facial asymmetry PSYCH: The patient's mood and manner are appropriate.  ____________________________________________   LABS (all labs ordered are listed, but only abnormal results are displayed)  Labs Reviewed  CBC WITH DIFFERENTIAL/PLATELET - Abnormal; Notable for the following components:      Result Value   WBC 17.1 (*)    Neutro Abs 14.1 (*)    Abs Immature Granulocytes 0.13 (*)    All other components within normal limits  RESP PANEL BY RT-PCR (FLU A&B, COVID) ARPGX2  PROTIME-INR  APTT  COMPREHENSIVE METABOLIC PANEL  LIPID PANEL  BRAIN NATRIURETIC PEPTIDE  HEMOGLOBIN A1C  TROPONIN I (HIGH SENSITIVITY)   ____________________________________________  EKG   Date: 06/06/2021 00:40  Rate: 87  Rhythm: normal sinus rhythm  QRS Axis: normal  Intervals: normal  ST/T Wave abnormalities: ST elevation in 1 and aVL  Conduction Disutrbances: none  Narrative Interpretation: Lateral STEMI   ____________________________________________  RADIOLOGY I, Miracle Criado, personally viewed and evaluated these images (plain radiographs) as part of my medical decision making, as well as reviewing the written report by the radiologist.  ED MD interpretation: Chest x-ray shows no acute abnormality.  Official radiology report(s): DG Chest Port 1 View  Result Date: 06/06/2021 CLINICAL DATA:  Chest pain, right shoulder pain EXAM: PORTABLE CHEST 1 VIEW COMPARISON:  10/04/2020 FINDINGS: Left suprahilar mass adjacent to the  aortic knob with fiducial markers is again identified and is stable since prior examination. Left-sided volume loss is unchanged. No superimposed focal pulmonary infiltrate. No pneumothorax or pleural effusion. Cardiac size within normal limits. Pulmonary vascularity is normal. No acute bone abnormality. IMPRESSION: No active disease. Stable appearance of left suprahilar mass and associated left-sided volume loss. Electronically Signed   By: Fidela Salisbury M.D.   On: 06/06/2021 01:00    ____________________________________________   PROCEDURES  Procedure(s) performed (including Critical Care):  Procedures  CRITICAL CARE Performed by: Cyril Mourning Joia Doyle   Total critical care time: 35 minutes  Critical care time was exclusive of separately billable procedures and treating other patients.  Critical care was necessary to treat or prevent imminent or life-threatening deterioration.  Critical care was time spent personally by me on the following activities: development of treatment plan with patient and/or surrogate as well as nursing, discussions with consultants, evaluation of patient's response to treatment, examination of patient, obtaining history from patient or surrogate, ordering and performing treatments and interventions, ordering and review of laboratory studies, ordering and review of radiographic studies, pulse oximetry and re-evaluation of patient's condition.  ____________________________________________   INITIAL IMPRESSION / ASSESSMENT AND PLAN / ED COURSE  As part of my medical decision making, I reviewed the following data within the [place] notes reviewed and incorporated, Labs reviewed , EKG interpreted , Old EKG reviewed, Old chart reviewed, A consult was requested and obtained from this/these consultant(s) Cardiology, and Notes from prior ED visits         Patient here with STEMI.  Hemodynamically stable.  Code STEMI activated in route with EMS  after I reviewed EMS EKG that showed lateral infarct with reciprocal changes.  Patient has already received full dose aspirin.  Will bolus with heparin.  ED PROGRESS  12:44 AM  Dr. Fletcher Anon with cards at bedside.   1:05 AM  Patient to cath lab.   I reviewed all nursing notes and pertinent previous records as available.  I have reviewed and interpreted any EKGs, lab and urine results, imaging (as available).  ____________________________________________   FINAL CLINICAL IMPRESSION(S) / ED DIAGNOSES  Final diagnoses:  ST elevation myocardial infarction (STEMI), unspecified artery [facility])     ED Discharge Orders     None       *Please note:  Scott Gross was evaluated in Emergency Department on 06/06/2021 for the symptoms described in the history of present illness. He was evaluated in the context of the global COVID-19 pandemic, which necessitated consideration that the patient might be at risk for infection with the SARS-CoV-2 virus that causes COVID-19. Institutional protocols and algorithms that pertain to the evaluation of patients at risk for COVID-19 are in a state  of rapid change based on information released by regulatory bodies including the CDC and federal and state organizations. These policies and algorithms were followed during the patient's care in the ED.  Some ED evaluations and interventions may be delayed as a result of limited staffing during and the pandemic.*   Note:  This document was prepared using Dragon voice recognition software and may include unintentional dictation errors.    Alannah Averhart, Delice Bison, DO 06/06/21 [phone]

## 2021-06-06 NOTE — Progress Notes (Signed)
TR band deflated per policy, site started to rebleed, 12 ml of air put in, bleeding contained.

## 2021-06-06 NOTE — ED Notes (Signed)
Dr Fletcher Anon in with pt and family

## 2021-06-06 NOTE — ED Notes (Signed)
Pt brought in via ems from home with right shoulder pain and chest pain for 2 hours.  Pt has nausea.  No sob.  stemi on arrival  Dr ward at bedside.  Ems gave ntg spray and asa.  Pt alert  Speech clear.

## 2021-06-06 NOTE — H&P (Signed)
History and Physical    Scott Gross:[id number] DOB: [birth year] DOA: 06/06/2021  PCP: [name], MD   Patient coming from: home  I have personally briefly reviewed patient's old medical records in [place]  Chief Complaint: STEMI s/p PCI to L circumflex  HPI: Scott Gross is a 87 y.o. male with medical history significant for DM, HTN, paroxysmal A. fib on Eliquis, CAD with prior stent to LAD, V. fib cardiac arrest, admitted s/p cardiac cath for lateral STEMI, after presenting to the ED with a 2-hour history of left-sided chest pain radiating to the shoulder.  He underwent stent placement to the left circumflex which was completely occluded and was noted to have disease in the LAD and RCA to be treated medically per Dr. Fletcher Anon.  Patient was transferred from the Cath Lab to stepdown in stable condition and currently denies chest pain, nausea, diaphoresis or shortness of breath  Review of Systems: As per HPI otherwise all other systems on review of systems negative.    Past Medical History:  Diagnosis Date   Arthritis    CAD (coronary artery disease) Nov. 12, 2012   Inferior ST elevation MI in November of 2012 with ventricular fibrillation arrest. Status post drug-eluting stent placement to the mid LAD. Most recent cardiac catheterization in June of this year showed patent stent with minimal restenosis, 75% proximal LAD and 80% distal LAD which were unchanged from before. Mild disease in the left circumflex and moderate RCA disease. Ejection fraction was 55%.   Cancer of upper lobe of left lung ([place]) 11/2016   Rad tx's.   Cardiac arrest [facility]) Nov. 2012   x 2    Cardiac arrest - ventricular fibrillation    HOH (hard of hearing)    Left Hearing Aid   Hyperlipidemia    Hypertension    Post PTCA 2013   x2   ST elevation MI (STEMI) ([place]) 08/26/2011    Past Surgical History:  Procedure Laterality Date   CARDIAC CATHETERIZATION  2013   @ [place]; [place] s/p stent     CORONARY ANGIOPLASTY     ELECTROMAGNETIC NAVIGATION BROCHOSCOPY N/A 11/27/2016   Procedure: ELECTROMAGNETIC NAVIGATION BRONCHOSCOPY;  Surgeon: [name], MD;  Location: [facility] ORS;  Service: Cardiopulmonary;  Laterality: N/A;     reports that he quit smoking about 9 years ago. His smoking use included cigarettes. He smoked an average of 1 pack per day. He has never used smokeless tobacco. He reports that he does not drink alcohol and does not use drugs.  Allergies  Allergen Reactions   Statins Other (See Comments)    Arthralagia    Family History  Problem Relation Age of Onset   Heart attack Mother    Heart attack Father       Prior to Admission medications   Medication Sig Start Date End Date Taking? Authorizing Provider  albuterol (VENTOLIN HFA) 108 (90 Base) MCG/ACT inhaler Inhale into the lungs every 6 (six) hours as needed. 01/24/21   [provider]  amLODipine (NORVASC) 5 MG tablet Take 5 mg by mouth 2 (two) times daily as needed.     [provider]  benzonatate (TESSALON) 200 MG capsule Take by mouth 3 (three) times daily as needed. Patient not taking: Reported on 05/11/2021 03/07/21   [provider]  chlorpheniramine-HYDROcodone ([place]) 10-8 MG/5ML SUER Take by mouth. Patient not taking: Reported on 05/11/2021 03/07/21   [provider]  ELIQUIS 5 MG TABS tablet 5 mg  2 (two) times daily. 12/22/20   [provider]  lisinopril-hydrochlorothiazide (ZESTORETIC) 20-12.5 MG tablet Take 1 tablet by mouth daily.    [provider]  losartan-hydrochlorothiazide (HYZAAR) 50-12.5 MG tablet Take 1 tablet by mouth daily. 10/10/20   [provider]  metoprolol succinate (TOPROL-XL) 50 MG 24 hr tablet Take 50 mg by mouth daily. 05/30/19   [provider]    Physical Exam: Vitals:   06/06/21 0045 06/06/21 0050 06/06/21 0107 06/06/21 0215  BP:  122/85  120/68  Pulse: 88 86  80  Resp: 14 16  18   Temp:    97.8 F  (36.6 C)  TempSrc:    Oral  SpO2: 96% 97% 96% 97%  Weight:    58.8 kg  Height:    5\' 9"  (1.753 m)     Vitals:   06/06/21 0045 06/06/21 0050 06/06/21 0107 06/06/21 0215  BP:  122/85  120/68  Pulse: 88 86  80  Resp: 14 16  18   Temp:    97.8 F (36.6 C)  TempSrc:    Oral  SpO2: 96% 97% 96% 97%  Weight:    58.8 kg  Height:    5\' 9"  (1.753 m)      Constitutional: Alert and oriented x 3 . Not in any apparent distress HEENT:      Head: Normocephalic and atraumatic.         Eyes: PERLA, EOMI, Conjunctivae are normal. Sclera is non-icteric.       Mouth/Throat: Mucous membranes are moist.       Neck: Supple with no signs of meningismus. Cardiovascular: Regular rate and rhythm. No murmurs, gallops, or rubs. 2+ symmetrical distal pulses are present . No JVD. No LE edema Respiratory: Respiratory effort normal .Lungs sounds clear bilaterally. No wheezes, crackles, or rhonchi.  Gastrointestinal: Soft, non tender, and non distended with positive bowel sounds.  Genitourinary: No CVA tenderness. Musculoskeletal: Nontender with normal range of motion in all extremities. No cyanosis, or erythema of extremities. Neurologic:  Face is symmetric. Moving all extremities. No gross focal neurologic deficits . Skin: Skin is warm, dry.  No rash or ulcers Psychiatric: Mood and affect are normal    Labs on Admission: I have personally reviewed following labs and imaging studies  CBC: Recent Labs  Lab 06/06/21 0033  WBC 17.1*  NEUTROABS 14.1*  HGB 16.8  HCT 47.3  MCV 87.4  PLT 295   Basic Metabolic Panel: Recent Labs  Lab 06/06/21 0033  NA 132*  K 3.3*  CL 92*  CO2 29  GLUCOSE 212*  BUN 13  CREATININE 0.80  CALCIUM 8.9   GFR: Estimated Creatinine Clearance: 56.1 mL/min (by C-G formula based on SCr of 0.8 mg/dL). Liver Function Tests: Recent Labs  Lab 06/06/21 0033  AST 21  ALT 12  ALKPHOS 99  BILITOT 1.1  PROT 7.6  ALBUMIN 4.4   No results for input(s): LIPASE, AMYLASE  in the last 168 hours. No results for input(s): AMMONIA in the last 168 hours. Coagulation Profile: Recent Labs  Lab 06/06/21 0033  INR 1.0   Cardiac Enzymes: No results for input(s): CKTOTAL, CKMB, CKMBINDEX, TROPONINI in the last 168 hours. BNP (last 3 results) No results for input(s): PROBNP in the last 8760 hours. HbA1C: No results for input(s): HGBA1C in the last 72 hours. CBG: No results for input(s): GLUCAP in the last 168 hours. Lipid Profile: Recent Labs    06/06/21 0033  CHOL 176  HDL 35*  LDLCALC  101*  TRIG 202*  CHOLHDL 5.0   Thyroid Function Tests: No results for input(s): TSH, T4TOTAL, FREET4, T3FREE, THYROIDAB in the last 72 hours. Anemia Panel: No results for input(s): VITAMINB12, FOLATE, FERRITIN, TIBC, IRON, RETICCTPCT in the last 72 hours. Urine analysis:    Component Value Date/Time   COLORURINE YELLOW (A) 10/04/2020 1239   APPEARANCEUR CLEAR (A) 10/04/2020 1239   APPEARANCEUR Clear 11/28/2013 0605   LABSPEC 1.003 (L) 10/04/2020 1239   LABSPEC 1.010 11/28/2013 0605   PHURINE 7.0 10/04/2020 1239   GLUCOSEU NEGATIVE 10/04/2020 1239   GLUCOSEU 50 mg/dL 11/28/2013 0605   HGBUR NEGATIVE 10/04/2020 1239   BILIRUBINUR NEGATIVE 10/04/2020 1239   BILIRUBINUR Negative 11/28/2013 0605   KETONESUR NEGATIVE 10/04/2020 1239   PROTEINUR NEGATIVE 10/04/2020 1239   NITRITE NEGATIVE 10/04/2020 1239   LEUKOCYTESUR NEGATIVE 10/04/2020 1239   LEUKOCYTESUR Negative 11/28/2013 0605    Radiological Exams on Admission: CARDIAC CATHETERIZATION  Result Date: 06/06/2021   Prox RCA lesion is 60% stenosed.   Prox RCA to Mid RCA lesion is 50% stenosed.   Dist RCA lesion is 30% stenosed.   Mid LAD-1 lesion is 10% stenosed.   Mid LAD-2 lesion is 60% stenosed.   Prox LAD to Mid LAD lesion is 70% stenosed.   Prox Cx to Mid Cx lesion is 100% stenosed.   A drug-eluting stent was successfully placed using a STENT ONYX FRONTIER [id number].   Post intervention, there is a 0% residual  stenosis.   There is mild left ventricular systolic dysfunction.   LV end diastolic pressure is moderately elevated.   The left ventricular ejection fraction is 45-50% by visual estimate. 1.  Significant underlying three-vessel coronary artery disease.  The culprit for lateral STEMI is an occluded proximal left circumflex.  Patent stent in the mid LAD with 70% stenosis in the proximal LAD and 60% disease in the mid LAD distal to the previously placed stent.  In addition, there is borderline disease in the right coronary artery. 2.  Mildly reduced LV systolic function with apical lateral hypokinesis.  Moderately elevated left ventricular end-diastolic pressure. 3.  Successful angioplasty and drug-eluting stent placement to the left circumflex. Recommendations: Treat with dual antiplatelet therapy with aspirin and ticagrelor for now.  Eliquis can be resumed in 1 to 2 days for paroxysmal atrial fibrillation.  Once Eliquis is resumed, recommend stopping aspirin to minimize the risk of bleeding on triple therapy. The rest of his coronary artery disease can likely be treated medically but if he has refractory anginal symptoms, PCI of the proximal LAD can be considered.  DG Chest Port 1 View  Result Date: 06/06/2021 CLINICAL DATA:  Chest pain, right shoulder pain EXAM: PORTABLE CHEST 1 VIEW COMPARISON:  10/04/2020 FINDINGS: Left suprahilar mass adjacent to the aortic knob with fiducial markers is again identified and is stable since prior examination. Left-sided volume loss is unchanged. No superimposed focal pulmonary infiltrate. No pneumothorax or pleural effusion. Cardiac size within normal limits. Pulmonary vascularity is normal. No acute bone abnormality. IMPRESSION: No active disease. Stable appearance of left suprahilar mass and associated left-sided volume loss. Electronically Signed   By: Fidela Salisbury M.D.   On: 06/06/2021 01:00     Assessment/Plan 87 year old male with history of DM, HTN, paroxysmal A.  fib on Eliquis, CAD with prior stent to LAD, V. fib cardiac arrest, admitted s/p PCI to left circumflex for lateral STEMI,     Acute ST elevation myocardial infarction (STEMI) of lateral wall - S/p stent  left circumflex that was totally occluded, please refer to the findings from cath report - Aspirin and Brilinta.  Hold Eliquis  until 8/24 per Dr. Fletcher Anon - Continue rosuvastatin, metoprolol, daily aspirin - Cardiology consult    Diabetes mellitus type 2, uncomplicated (HCC) - Sliding scale insulin coverage    PAF (paroxysmal atrial fibrillation) (HCC)   Chronic anticoagulation - Hold Eliquis until 8/24 per Dr Fletcher Anon - Continue metoprolol    DVT prophylaxis: Lovenox  Code Status: full code  Family Communication: Wife and daughter at bedside Disposition Plan: Back to previous home environment Consults called: cardiology  Status:At the time of admission, it appears that the appropriate admission status for this patient is INPATIENT. This is judged to be reasonable and necessary in order to provide the required intensity of service to ensure the patient's safety given the presenting symptoms, physical exam findings, and initial radiographic and laboratory data in the context of their  Comorbid conditions.   Patient requires inpatient status due to high intensity of service, high risk for further deterioration and high frequency of surveillance required.   I certify that at the point of admission it is my clinical judgment that the patient will require inpatient hospital care spanning beyond [place] MD [facility]     06/06/2021, 3:14 AM

## 2021-06-06 NOTE — Progress Notes (Signed)
   06/06/21 0100  Clinical Encounter Type  Visited With Family;Health care provider  Visit Type Initial;Code  Referral From Nurse  Stress Factors  Patient Stress Factors Not reviewed  Family Stress Factors Health changes   This chaplain responded to a code STEMI page for the patient. Upon arrival, the medical team was assessing the patient and administering care. Mr. Yoakum was taken for cardiac catheterization. His wife and two adult daughters are present and waiting in the hallway near the [facility] Waiting room. The patient's wife shared that they have been married for 20 years. She is understandably worried and concerned. She shared that they have three adult daughters; two are local and the other is in [place].   Support provided in the form of active and reflective listening, compassionate ministerial presence, and prayer. Chaplains remain available for support.  Gennaro Africa, Chaplain

## 2021-06-06 NOTE — Progress Notes (Signed)
Initial Nutrition Assessment  DOCUMENTATION CODES:  Severe malnutrition in context of chronic illness  INTERVENTION:  Add Ensure Plus po TID, each supplement provides 350 kcal and 13 grams of protein.   Add Vital Cuisine Shake BID, each supplement provides 520 kcal and 22 grams of protein.  Add MVI with minerals daily.  Encourage meal and supplement intake.  NUTRITION DIAGNOSIS:  Severe Malnutrition related to chronic illness, cancer and cancer related treatments as evidenced by severe fat depletion, severe muscle depletion.  GOAL:  Patient will meet greater than or equal to 90% of their needs  MONITOR:  PO intake, Supplement acceptance, Labs, Weight trends, I & O's  REASON FOR ASSESSMENT:  Malnutrition Screening Tool    ASSESSMENT:  87 yo male with a PMH of T2DM, HTN, paroxysmal A. fib, CAD with prior stent to LAD, V. fib cardiac arrest, and left lobe lung cancer (s/p radiation) who is admitted s/p cardiac cath for lateral STEMI, after presenting to the ED with a 2-hour history of left-sided chest pain radiating to the shoulder. 8/23 - Coronary/Graft Acute MI Revascularization   Spoke with pt and wife at bedside. Wife reports that pt has been losing weight and been having decreased appetite due to several rounds of radiation within the last few months. He has not gotten his appetite back yet. She reports that he drinks Ensure at home to help with extra calories and protein. No meal intakes documented at this time in Epic.  Pt endorses weight loss. Per Epic, pt's weight has remained rather consistent over the past 6 months, ranging between 58.8-59.7 kg.  UOP: 8/22: 550 ml 8/23: 300 ml  On exam, pt with moderate to severe depletions throughout his body.  Recommend adding Ensure Plus TID and Hormel shakes BID, as well as MVI with minerals daily.  Medications: reviewed; SSI, mealtime Novolog, Protonix, Zofran PRN (given once today)  Labs: reviewed; Na 134 (L), CBG 110-145  (H) HbA1c: 6.2% (05/23/2019)  NUTRITION - FOCUSED PHYSICAL EXAM: Flowsheet Row Most Recent Value  Orbital Region Moderate depletion  Upper Arm Region Severe depletion  Thoracic and Lumbar Region Moderate depletion  Buccal Region Severe depletion  Temple Region Severe depletion  Clavicle Bone Region Severe depletion  Clavicle and Acromion Bone Region Severe depletion  Scapular Bone Region Unable to assess  Dorsal Hand Severe depletion  Patellar Region Moderate depletion  Anterior Thigh Region Mild depletion  Posterior Calf Region Mild depletion  Edema (RD Assessment) None  Hair Reviewed  Eyes Reviewed  Mouth Reviewed  Skin Reviewed  Nails Reviewed   Diet Order:   Diet Order             Diet heart healthy/carb modified Room service appropriate? Yes; Fluid consistency: Thin  Diet effective now                  EDUCATION NEEDS:  Education needs have been addressed  Skin:  Skin Assessment: Reviewed RN Assessment  Last BM:  unknown  Height:  Ht Readings from Last 1 Encounters:  06/06/21 5\' 9"  (1.753 m)   Weight:  Wt Readings from Last 1 Encounters:  06/06/21 58.8 kg   BMI:  Body mass index is 19.14 kg/m.  Estimated Nutritional Needs:  Kcal:  2100-2300 Protein:  80-95 grams Fluid:  >2.1 L  Derrel Nip, RD, LDN (she/her/hers) Registered Dietitian I After-Hours/Weekend Pager # in [place]

## 2021-06-06 NOTE — ED Notes (Signed)
Pt to cath lab with nurse and md   Family with pt

## 2021-06-06 NOTE — Progress Notes (Signed)
    Admitted early this morning with a lateral STEMI, s/p PCI/DES to the LCx with residual multivessel disease as outlined in the cath report. Anginal equivalent may be bilateral shoulder pain upon further discussion with him this morning as this seems to have improved following PCI. Currently without angina, dyspnea, palpitations, or dizziness. Telemetry has demonstrated SR with PVCs and short run of NSVT lasting 7 beats. Await echo with further recommendations pending. Remain in the ICU for today with possible transfer to 2A late tonight or in the morning on 8/24. Look to resume Eliquis, if HGB remains stable on 8/24 given PCI just this AM.

## 2021-06-06 NOTE — Progress Notes (Signed)
*  PRELIMINARY RESULTS* Echocardiogram 2D Echocardiogram has been performed.  Scott Gross 06/06/2021, 9:36 AM

## 2021-06-06 NOTE — ED Triage Notes (Signed)
Pt brought in via ems from home with chest pain and right shoulder pain.  Stemi on arrival.  Hx mi.  Pt alert.  Iv in place.  Asa , ntg given by ems

## 2021-06-06 NOTE — Progress Notes (Signed)
PROGRESS NOTE    Scott Gross  ZHY:[id number] DOB: March 24, 1934 DOA: 06/06/2021 PCP: [name], MD  [id number]/[id number]-[id number]   Assessment & Plan:   Principal Problem:   Acute ST elevation myocardial infarction (STEMI) of lateral wall ([place]) Active Problems:   Diabetes mellitus type 2, uncomplicated (HCC)   PAF (paroxysmal atrial fibrillation) (HCC)   Chronic anticoagulation   Protein-calorie malnutrition, severe   Scott Gross is a 87 y.o. male with medical history significant for DM, HTN, paroxysmal A. fib on Eliquis, CAD with prior stent to LAD, V. fib cardiac arrest, admitted s/p cardiac cath for lateral STEMI, after presenting to the ED with a 2-hour history of left-sided chest pain radiating to the shoulder.  He underwent stent placement to the left circumflex which was completely occluded and was noted to have disease in the LAD and RCA to be treated medically per Dr. Fletcher Anon.  Patient was transferred from the Cath Lab to stepdown in stable condition.     Acute ST elevation myocardial infarction (STEMI) of lateral wall - S/p stent left circumflex that was totally occluded Plan: --cont ASA and Brilinta now --per cardiology, "Anticipate transitioning to clopidogrel (300 mg load followed by 75 mg daily) tomorrow in place of ticagrelor and addition of apixaban 2.5 mg twice daily in the setting of PAF.  Would recommend continuing aspirin 81 mg daily (triple therapy) for 1 week then stopping aspirin." --add PPI in the setting of aggressive antithrombotic/antiplatelet therapy.  --Defer PCI to noncritical disease involving LAD and RCA for now, though PCI may need to be considered in the future if Scott Gross has recurrent chest or shoulder pain (query anginal equivalent).   --follow up Echo --cont metop and stain    Diabetes mellitus type 2, uncomplicated (HCC) - Sliding scale insulin coverage     PAF (paroxysmal atrial fibrillation) (HCC)   Chronic anticoagulation - Hold Eliquis until  8/24 per Dr Fletcher Anon - Continue metoprolol   DVT prophylaxis: Lovenox SQ Code Status: Full code  Family Communication: daughter and wife updated at bedside today Level of care: Stepdown Dispo:   The patient is from: home Anticipated d/c is to: home Anticipated d/c date is: 1-2 days Patient currently is not medically ready to d/c due to: need monitoring post MI, per cardiology.   Subjective and Interval History:  Pt reported doing well, no chest pain, ready to go home.   Objective: Vitals:   06/06/21 1400 06/06/21 1500 06/06/21 1600 06/06/21 1700  BP: 108/66 111/82 112/72 123/81  Pulse: 83 83 82 89  Resp: 14 15 (!) 21 18  Temp:    99.9 F (37.7 C)  TempSrc:    Oral  SpO2: 99% 96% 94% 100%  Weight:      Height:        Intake/Output Summary (Last 24 hours) at 06/06/2021 1958 Last data filed at 06/06/2021 1844 Gross per 24 hour  Intake 173.3 ml  Output 1625 ml  Net -1451.7 ml   Filed Weights   06/06/21 0037 06/06/21 0215  Weight: 59.4 kg 58.8 kg    Examination:   Constitutional: NAD, AAOx3, up in chair HEENT: conjunctivae and lids normal, EOMI CV: No cyanosis.  RRR. RESP: normal respiratory effort, on RA Extremities: No effusions, edema in BLE SKIN: warm, dry Neuro: II - XII grossly intact.   Psych: Normal mood and affect.  Appropriate judgement and reason   Data Reviewed: I have personally reviewed following labs and imaging studies  CBC: Recent Labs  Lab 06/06/21 0033 06/06/21 0534  WBC 17.1* 13.5*  NEUTROABS 14.1*  --   HGB 16.8 15.4  HCT 47.3 43.1  MCV 87.4 88.3  PLT 217 702   Basic Metabolic Panel: Recent Labs  Lab 06/06/21 0033 06/06/21 0534  NA 132* 134*  K 3.3* 3.5  CL 92* 99  CO2 29 24  GLUCOSE 212* 143*  BUN 13 10  CREATININE 0.80 0.71  CALCIUM 8.9 8.5*   GFR: Estimated Creatinine Clearance: 56.1 mL/min (by C-G formula based on SCr of 0.71 mg/dL). Liver Function Tests: Recent Labs  Lab 06/06/21 0033  AST 21  ALT 12  ALKPHOS  99  BILITOT 1.1  PROT 7.6  ALBUMIN 4.4   No results for input(s): LIPASE, AMYLASE in the last 168 hours. No results for input(s): AMMONIA in the last 168 hours. Coagulation Profile: Recent Labs  Lab 06/06/21 0033  INR 1.0   Cardiac Enzymes: No results for input(s): CKTOTAL, CKMB, CKMBINDEX, TROPONINI in the last 168 hours. BNP (last 3 results) No results for input(s): PROBNP in the last 8760 hours. HbA1C: No results for input(s): HGBA1C in the last 72 hours. CBG: Recent Labs  Lab 06/06/21 0205 06/06/21 0734 06/06/21 1121 06/06/21 1632  GLUCAP 187* 145* 168* 148*   Lipid Profile: Recent Labs    06/06/21 0033  CHOL 176  HDL 35*  LDLCALC 101*  TRIG 202*  CHOLHDL 5.0   Thyroid Function Tests: No results for input(s): TSH, T4TOTAL, FREET4, T3FREE, THYROIDAB in the last 72 hours. Anemia Panel: No results for input(s): VITAMINB12, FOLATE, FERRITIN, TIBC, IRON, RETICCTPCT in the last 72 hours. Sepsis Labs: No results for input(s): PROCALCITON, LATICACIDVEN in the last 168 hours.  Recent Results (from the past 240 hour(s))  Resp Panel by RT-PCR (Flu A&B, Covid) Nasopharyngeal Swab     Status: None   Collection Time: 06/06/21 12:33 AM   Specimen: Nasopharyngeal Swab; Nasopharyngeal(NP) swabs in vial transport medium  Result Value Ref Range Status   SARS Coronavirus 2 by RT PCR NEGATIVE NEGATIVE Final    Comment: (NOTE) SARS-CoV-2 target nucleic acids are NOT DETECTED.  The SARS-CoV-2 RNA is generally detectable in upper respiratory specimens during the acute phase of infection. The lowest concentration of SARS-CoV-2 viral copies this assay can detect is 138 copies/mL. A negative result does not preclude SARS-Cov-2 infection and should not be used as the sole basis for treatment or other patient management decisions. A negative result may occur with  improper specimen collection/handling, submission of specimen other than nasopharyngeal swab, presence of viral  mutation(s) within the areas targeted by this assay, and inadequate number of viral copies(<138 copies/mL). A negative result must be combined with clinical observations, patient history, and epidemiological information. The expected result is Negative.  Fact Sheet for Patients:  EntrepreneurPulse.com.au  Fact Sheet for Healthcare Providers:  IncredibleEmployment.be  This test is no t yet approved or cleared by the [place] FDA and  has been authorized for detection and/or diagnosis of SARS-CoV-2 by FDA under an Emergency Use Authorization (EUA). This EUA will remain  in effect (meaning this test can be used) for the duration of the COVID-19 declaration under Section 564(b)(1) of the Act, 21 U.S.C.section 360bbb-3(b)(1), unless the authorization is terminated  or revoked sooner.       Influenza A by PCR NEGATIVE NEGATIVE Final   Influenza B by PCR NEGATIVE NEGATIVE Final    Comment: (NOTE) The Xpert Xpress SARS-CoV-2/FLU/RSV plus assay is intended as an aid in the diagnosis  of influenza from Nasopharyngeal swab specimens and should not be used as a sole basis for treatment. Nasal washings and aspirates are unacceptable for Xpert Xpress SARS-CoV-2/FLU/RSV testing.  Fact Sheet for Patients: EntrepreneurPulse.com.au  Fact Sheet for Healthcare Providers: IncredibleEmployment.be  This test is not yet approved or cleared by the [place] FDA and has been authorized for detection and/or diagnosis of SARS-CoV-2 by FDA under an Emergency Use Authorization (EUA). This EUA will remain in effect (meaning this test can be used) for the duration of the COVID-19 declaration under Section 564(b)(1) of the Act, 21 U.S.C. section 360bbb-3(b)(1), unless the authorization is terminated or revoked.  Performed at [facility], [address] [place], [place] [address]   MRSA Next Gen by PCR, Nasal      Status: None   Collection Time: 06/06/21  2:15 AM   Specimen: Nasal Mucosa; Nasal Swab  Result Value Ref Range Status   MRSA by PCR Next Gen NOT DETECTED NOT DETECTED Final    Comment: (NOTE) The GeneXpert MRSA Assay (FDA approved for NASAL specimens only), is one component of a comprehensive MRSA colonization surveillance program. It is not intended to diagnose MRSA infection nor to guide or monitor treatment for MRSA infections. Test performance is not FDA approved in patients less than 7 years old. Performed at [facility], [address], [place], [place] [address]       Radiology Studies: CARDIAC CATHETERIZATION  Result Date: 06/06/2021   Prox RCA lesion is 60% stenosed.   Prox RCA to Mid RCA lesion is 50% stenosed.   Dist RCA lesion is 30% stenosed.   Mid LAD-1 lesion is 10% stenosed.   Mid LAD-2 lesion is 60% stenosed.   Prox LAD to Mid LAD lesion is 70% stenosed.   Prox Cx to Mid Cx lesion is 100% stenosed.   A drug-eluting stent was successfully placed using a STENT ONYX FRONTIER [id number].   Post intervention, there is a 0% residual stenosis.   There is mild left ventricular systolic dysfunction.   LV end diastolic pressure is moderately elevated.   The left ventricular ejection fraction is 45-50% by visual estimate. 1.  Significant underlying three-vessel coronary artery disease.  The culprit for lateral STEMI is an occluded proximal left circumflex.  Patent stent in the mid LAD with 70% stenosis in the proximal LAD and 60% disease in the mid LAD distal to the previously placed stent.  In addition, there is borderline disease in the right coronary artery. 2.  Mildly reduced LV systolic function with apical lateral hypokinesis.  Moderately elevated left ventricular end-diastolic pressure. 3.  Successful angioplasty and drug-eluting stent placement to the left circumflex. Recommendations: Treat with dual antiplatelet therapy with aspirin and ticagrelor for now.  Eliquis can be  resumed in 1 to 2 days for paroxysmal atrial fibrillation.  Once Eliquis is resumed, recommend stopping aspirin to minimize the risk of bleeding on triple therapy. The rest of his coronary artery disease can likely be treated medically but if he has refractory anginal symptoms, PCI of the proximal LAD can be considered.  DG Chest Port 1 View  Result Date: 06/06/2021 CLINICAL DATA:  Chest pain, right shoulder pain EXAM: PORTABLE CHEST 1 VIEW COMPARISON:  10/04/2020 FINDINGS: Left suprahilar mass adjacent to the aortic knob with fiducial markers is again identified and is stable since prior examination. Left-sided volume loss is unchanged. No superimposed focal pulmonary infiltrate. No pneumothorax or pleural effusion. Cardiac size within normal limits. Pulmonary vascularity is  normal. No acute bone abnormality. IMPRESSION: No active disease. Stable appearance of left suprahilar mass and associated left-sided volume loss. Electronically Signed   By: Fidela Salisbury M.D.   On: 06/06/2021 01:00   ECHOCARDIOGRAM COMPLETE  Result Date: 06/06/2021    ECHOCARDIOGRAM REPORT   Patient Name:   Scott Gross Date of Exam: 06/06/2021 Medical Rec #:  [id number]       Height:       69.0 in Accession #:    [id number]      Weight:       129.6 lb Date of Birth:  05/22/1934       BSA:          1.718 m Patient Age:    34 years        BP:           106/69 mmHg Patient Gender: M               HR:           82 bpm. Exam Location:  [facility] Procedure: 2D Echo, Color Doppler and Cardiac Doppler Indications:     I21.9 Acute myocardial infarction, unspecified  History:         Patient has no prior history of Echocardiogram examinations.                  CAD; Risk Factors:Hypertension and Dyslipidemia.  Sonographer:     Charmayne Sheer Referring Phys:  [id number] Rise Mu Diagnosing Phys: Yolonda Kida MD  Sonographer Comments: Global longitudinal strain was attempted. IMPRESSIONS  1. Lateral Inferior Hypo.  2. Left ventricular ejection  fraction, by estimation, is 45 to 50%. The left ventricle has mildly decreased function. The left ventricle demonstrates regional wall motion abnormalities (see scoring diagram/findings for description). Left ventricular diastolic parameters were normal.  3. Right ventricular systolic function is normal. The right ventricular size is normal.  4. The mitral valve is normal in structure. No evidence of mitral valve regurgitation.  5. The aortic valve is normal in structure. Aortic valve regurgitation is not visualized. Mild aortic valve sclerosis is present, with no evidence of aortic valve stenosis. FINDINGS  Left Ventricle: Left ventricular ejection fraction, by estimation, is 45 to 50%. The left ventricle has mildly decreased function. The left ventricle demonstrates regional wall motion abnormalities. The left ventricular internal cavity size was normal in size. There is borderline concentric left ventricular hypertrophy. Left ventricular diastolic parameters were normal. Right Ventricle: The right ventricular size is normal. No increase in right ventricular wall thickness. Right ventricular systolic function is normal. Left Atrium: Left atrial size was normal in size. Right Atrium: Right atrial size was normal in size. Pericardium: There is no evidence of pericardial effusion. Mitral Valve: The mitral valve is normal in structure. No evidence of mitral valve regurgitation. MV peak gradient, 1.1 mmHg. The mean mitral valve gradient is 1.0 mmHg. Tricuspid Valve: The tricuspid valve is normal in structure. Tricuspid valve regurgitation is trivial. Aortic Valve: The aortic valve is normal in structure. Aortic valve regurgitation is not visualized. Aortic regurgitation PHT measures 416 msec. Mild aortic valve sclerosis is present, with no evidence of aortic valve stenosis. Aortic valve mean gradient  measures 2.0 mmHg. Aortic valve peak gradient measures 4.4 mmHg. Aortic valve area, by VTI measures 1.98 cm. Pulmonic  Valve: The pulmonic valve was normal in structure. Pulmonic valve regurgitation is not visualized. Aorta: The ascending aorta was not well visualized. IAS/Shunts: No atrial level shunt  detected by color flow Doppler. Additional Comments: Lateral Inferior Hypo.  LEFT VENTRICLE PLAX 2D LVIDd:         3.80 cm  Diastology LVIDs:         2.50 cm  LV e' medial:    5.00 cm/s LV PW:         0.70 cm  LV E/e' medial:  11.1 LV IVS:        0.70 cm  LV e' lateral:   2.94 cm/s LVOT diam:     1.90 cm  LV E/e' lateral: 18.8 LV SV:         39 LV SV Index:   22 LVOT Area:     2.84 cm  RIGHT VENTRICLE RV Basal diam:  2.60 cm TAPSE (M-mode): 1.9 cm LEFT ATRIUM             Index       RIGHT ATRIUM          Index LA diam:        2.40 cm 1.40 cm/m  RA Area:     7.88 cm LA Vol (A2C):   36.8 ml 21.42 ml/m RA Volume:   13.50 ml 7.86 ml/m LA Vol (A4C):   27.3 ml 15.89 ml/m LA Biplane Vol: 32.6 ml 18.97 ml/m  AORTIC VALVE                   PULMONIC VALVE AV Area (Vmax):    2.01 cm    PV Vmax:          0.79 m/s AV Area (Vmean):   2.01 cm    PV Vmean:         53.200 cm/s AV Area (VTI):     1.98 cm    PV VTI:           0.153 m AV Vmax:           105.00 cm/s PV Peak grad:     2.5 mmHg AV Vmean:          73.900 cm/s PV Mean grad:     1.0 mmHg AV VTI:            0.195 m     PR End Diast Vel: 5.57 msec AV Peak Grad:      4.4 mmHg AV Mean Grad:      2.0 mmHg LVOT Vmax:         74.40 cm/s LVOT Vmean:        52.500 cm/s LVOT VTI:          0.136 m LVOT/AV VTI ratio: 0.70 AI PHT:            416 msec  AORTA Ao Root diam: 3.00 cm MITRAL VALVE               TRICUSPID VALVE MV Area (PHT): 4.49 cm    TR Peak grad:   27.2 mmHg MV Area VTI:   3.17 cm    TR Vmax:        261.00 cm/s MV Peak grad:  1.1 mmHg MV Mean grad:  1.0 mmHg    SHUNTS MV Vmax:       0.53 m/s    Systemic VTI:  0.14 m MV Vmean:      36.8 cm/s   Systemic Diam: 1.90 cm MV Decel Time: 169 msec MV E velocity: 55.30 cm/s MV A velocity: 48.80 cm/s MV E/A ratio:  1.13 Yolonda Kida MD  Electronically  signed by Yolonda Kida MD Signature Date/Time: 06/06/2021/1:36:37 PM    Final      Scheduled Meds:  aspirin  81 mg Oral Daily   Chlorhexidine Gluconate Cloth  6 each Topical Q0600   enoxaparin (LOVENOX) injection  40 mg Subcutaneous Q24H   feeding supplement  237 mL Oral TID BM   insulin aspart  0-5 Units Subcutaneous QHS   insulin aspart  0-9 Units Subcutaneous TID WC   metoprolol succinate  50 mg Oral Daily   multivitamin with minerals  1 tablet Oral Daily   pantoprazole  40 mg Oral Daily   rosuvastatin  20 mg Oral Daily   sodium chloride flush  3 mL Intravenous Q12H   ticagrelor  90 mg Oral BID   Continuous Infusions:  sodium chloride 10 mL/hr at 06/06/21 0045   sodium chloride       LOS: 0 days   No charge note.  Enzo Bi, MD [facility] If 7PM-7AM, please contact night-coverage 06/06/2021, 7:58 PM

## 2021-06-07 ENCOUNTER — Other Ambulatory Visit: Payer: Self-pay

## 2021-06-07 DIAGNOSIS — I480 Paroxysmal atrial fibrillation: Secondary | ICD-10-CM | POA: Diagnosis not present

## 2021-06-07 DIAGNOSIS — E119 Type 2 diabetes mellitus without complications: Secondary | ICD-10-CM | POA: Diagnosis not present

## 2021-06-07 DIAGNOSIS — I2129 ST elevation (STEMI) myocardial infarction involving other sites: Secondary | ICD-10-CM | POA: Diagnosis not present

## 2021-06-07 LAB — CBC
HCT: 41.8 % (ref 39.0–52.0)
Hemoglobin: 14.9 g/dL (ref 13.0–17.0)
MCH: 31.6 pg (ref 26.0–34.0)
MCHC: 35.6 g/dL (ref 30.0–36.0)
MCV: 88.7 fL (ref 80.0–100.0)
Platelets: 196 10*3/uL (ref 150–400)
RBC: 4.71 MIL/uL (ref 4.22–5.81)
RDW: 13.1 % (ref 11.5–15.5)
WBC: 12.7 10*3/uL — ABNORMAL HIGH (ref 4.0–10.5)
nRBC: 0 % (ref 0.0–0.2)

## 2021-06-07 LAB — GLUCOSE, CAPILLARY
Glucose-Capillary: 146 mg/dL — ABNORMAL HIGH (ref 70–99)
Glucose-Capillary: 159 mg/dL — ABNORMAL HIGH (ref 70–99)
Glucose-Capillary: 86 mg/dL (ref 70–99)
Glucose-Capillary: 144 mg/dL — ABNORMAL HIGH (ref 70–99)

## 2021-06-07 LAB — BASIC METABOLIC PANEL
Anion gap: 7 (ref 5–15)
BUN: 9 mg/dL (ref 8–23)
CO2: 29 mmol/L (ref 22–32)
Calcium: 8.8 mg/dL — ABNORMAL LOW (ref 8.9–10.3)
Chloride: 99 mmol/L (ref 98–111)
Creatinine, Ser: 0.83 mg/dL (ref 0.61–1.24)
GFR, Estimated: >60 mL/min (ref >60)
Glucose, Bld: 147 mg/dL — ABNORMAL HIGH (ref 70–99)
Potassium: 3.7 mmol/L (ref 3.5–5.1)
Sodium: 135 mmol/L (ref 135–145)

## 2021-06-07 LAB — MAGNESIUM: Magnesium: 2.1 mg/dL (ref 1.7–2.4)

## 2021-06-07 NOTE — Progress Notes (Signed)
[facility] Cardiology    SUBJECTIVE: Patient doing reasonably well denies any chest pain no shortness of breath would like to start walking started laying in the bed but no significant complaints otherwise   Vitals:   06/07/21 0400 06/07/21 0500 06/07/21 0600 06/07/21 0700  BP: 105/62 121/79 122/80 106/61  Pulse: 81 85 85 85  Resp: 18 18 19 20   Temp:      TempSrc:      SpO2: 98% 98% 99% 99%  Weight:      Height:         Intake/Output Summary (Last 24 hours) at 06/07/2021 0825 Last data filed at 06/06/2021 2200 Gross per 24 hour  Intake 138.67 ml  Output 1375 ml  Net -1236.33 ml      PHYSICAL EXAM  General: Well developed, well nourished, in no acute distress HEENT:  Normocephalic and atramatic Neck:  No JVD.  Lungs: Clear bilaterally to auscultation and percussion. Heart: HRRR . Normal S1 and S2 without gallops or murmurs.  Abdomen: Bowel sounds are positive, abdomen soft and non-tender  Msk:  Back normal, normal gait. Normal strength and tone for age. Extremities: No clubbing, cyanosis or edema.   Neuro: Alert and oriented X 3. Psych:  Good affect, responds appropriately   LABS: Basic Metabolic Panel: Recent Labs    06/06/21 0534 06/07/21 0504  NA 134* 135  K 3.5 3.7  CL 99 99  CO2 24 29  GLUCOSE 143* 147*  BUN 10 9  CREATININE 0.71 0.83  CALCIUM 8.5* 8.8*  MG  --  2.1   Liver Function Tests: Recent Labs    06/06/21 0033  AST 21  ALT 12  ALKPHOS 99  BILITOT 1.1  PROT 7.6  ALBUMIN 4.4   No results for input(s): LIPASE, AMYLASE in the last 72 hours. CBC: Recent Labs    06/06/21 0033 06/06/21 0534 06/07/21 0504  WBC 17.1* 13.5* 12.7*  NEUTROABS 14.1*  --   --   HGB 16.8 15.4 14.9  HCT 47.3 43.1 41.8  MCV 87.4 88.3 88.7  PLT 217 201 196   Cardiac Enzymes: No results for input(s): CKTOTAL, CKMB, CKMBINDEX, TROPONINI in the last 72 hours. BNP: Invalid input(s): POCBNP D-Dimer: No results for input(s): DDIMER in the last 72 hours. Hemoglobin  A1C: Recent Labs    06/06/21 0523  HGBA1C 6.2*   Fasting Lipid Panel: Recent Labs    06/06/21 0033  CHOL 176  HDL 35*  LDLCALC 101*  TRIG 202*  CHOLHDL 5.0   Thyroid Function Tests: No results for input(s): TSH, T4TOTAL, T3FREE, THYROIDAB in the last 72 hours.  Invalid input(s): FREET3 Anemia Panel: No results for input(s): VITAMINB12, FOLATE, FERRITIN, TIBC, IRON, RETICCTPCT in the last 72 hours.  CARDIAC CATHETERIZATION  Result Date: 06/06/2021   Prox RCA lesion is 60% stenosed.   Prox RCA to Mid RCA lesion is 50% stenosed.   Dist RCA lesion is 30% stenosed.   Mid LAD-1 lesion is 10% stenosed.   Mid LAD-2 lesion is 60% stenosed.   Prox LAD to Mid LAD lesion is 70% stenosed.   Prox Cx to Mid Cx lesion is 100% stenosed.   A drug-eluting stent was successfully placed using a STENT ONYX FRONTIER [id number].   Post intervention, there is a 0% residual stenosis.   There is mild left ventricular systolic dysfunction.   LV end diastolic pressure is moderately elevated.   The left ventricular ejection fraction is 45-50% by visual estimate. 1.  Significant underlying three-vessel  coronary artery disease.  The culprit for lateral STEMI is an occluded proximal left circumflex.  Patent stent in the mid LAD with 70% stenosis in the proximal LAD and 60% disease in the mid LAD distal to the previously placed stent.  In addition, there is borderline disease in the right coronary artery. 2.  Mildly reduced LV systolic function with apical lateral hypokinesis.  Moderately elevated left ventricular end-diastolic pressure. 3.  Successful angioplasty and drug-eluting stent placement to the left circumflex. Recommendations: Treat with dual antiplatelet therapy with aspirin and ticagrelor for now.  Eliquis can be resumed in 1 to 2 days for paroxysmal atrial fibrillation.  Once Eliquis is resumed, recommend stopping aspirin to minimize the risk of bleeding on triple therapy. The rest of his coronary artery disease  can likely be treated medically but if he has refractory anginal symptoms, PCI of the proximal LAD can be considered.  DG Chest Port 1 View  Result Date: 06/06/2021 CLINICAL DATA:  Chest pain, right shoulder pain EXAM: PORTABLE CHEST 1 VIEW COMPARISON:  10/04/2020 FINDINGS: Left suprahilar mass adjacent to the aortic knob with fiducial markers is again identified and is stable since prior examination. Left-sided volume loss is unchanged. No superimposed focal pulmonary infiltrate. No pneumothorax or pleural effusion. Cardiac size within normal limits. Pulmonary vascularity is normal. No acute bone abnormality. IMPRESSION: No active disease. Stable appearance of left suprahilar mass and associated left-sided volume loss. Electronically Signed   By: Fidela Salisbury M.D.   On: 06/06/2021 01:00   ECHOCARDIOGRAM COMPLETE  Result Date: 06/06/2021    ECHOCARDIOGRAM REPORT   Patient Name:   Scott Gross Date of Exam: 06/06/2021 Medical Rec #:  [id number]       Height:       69.0 in Accession #:    [id number]      Weight:       129.6 lb Date of Birth:  1934-07-20       BSA:          1.718 m Patient Age:    87 years        BP:           106/69 mmHg Patient Gender: M               HR:           82 bpm. Exam Location:  [facility] Procedure: 2D Echo, Color Doppler and Cardiac Doppler Indications:     I21.9 Acute myocardial infarction, unspecified  History:         Patient has no prior history of Echocardiogram examinations.                  CAD; Risk Factors:Hypertension and Dyslipidemia.  Sonographer:     Charmayne Sheer Referring Phys:  [id number] Rise Mu Diagnosing Phys: Yolonda Kida MD  Sonographer Comments: Global longitudinal strain was attempted. IMPRESSIONS  1. Lateral Inferior Hypo.  2. Left ventricular ejection fraction, by estimation, is 45 to 50%. The left ventricle has mildly decreased function. The left ventricle demonstrates regional wall motion abnormalities (see scoring diagram/findings for description). Left  ventricular diastolic parameters were normal.  3. Right ventricular systolic function is normal. The right ventricular size is normal.  4. The mitral valve is normal in structure. No evidence of mitral valve regurgitation.  5. The aortic valve is normal in structure. Aortic valve regurgitation is not visualized. Mild aortic valve sclerosis is present, with no evidence of aortic valve stenosis. FINDINGS  Left Ventricle: Left ventricular ejection fraction, by estimation, is 45 to 50%. The left ventricle has mildly decreased function. The left ventricle demonstrates regional wall motion abnormalities. The left ventricular internal cavity size was normal in size. There is borderline concentric left ventricular hypertrophy. Left ventricular diastolic parameters were normal. Right Ventricle: The right ventricular size is normal. No increase in right ventricular wall thickness. Right ventricular systolic function is normal. Left Atrium: Left atrial size was normal in size. Right Atrium: Right atrial size was normal in size. Pericardium: There is no evidence of pericardial effusion. Mitral Valve: The mitral valve is normal in structure. No evidence of mitral valve regurgitation. MV peak gradient, 1.1 mmHg. The mean mitral valve gradient is 1.0 mmHg. Tricuspid Valve: The tricuspid valve is normal in structure. Tricuspid valve regurgitation is trivial. Aortic Valve: The aortic valve is normal in structure. Aortic valve regurgitation is not visualized. Aortic regurgitation PHT measures 416 msec. Mild aortic valve sclerosis is present, with no evidence of aortic valve stenosis. Aortic valve mean gradient  measures 2.0 mmHg. Aortic valve peak gradient measures 4.4 mmHg. Aortic valve area, by VTI measures 1.98 cm. Pulmonic Valve: The pulmonic valve was normal in structure. Pulmonic valve regurgitation is not visualized. Aorta: The ascending aorta was not well visualized. IAS/Shunts: No atrial level shunt detected by color flow  Doppler. Additional Comments: Lateral Inferior Hypo.  LEFT VENTRICLE PLAX 2D LVIDd:         3.80 cm  Diastology LVIDs:         2.50 cm  LV e' medial:    5.00 cm/s LV PW:         0.70 cm  LV E/e' medial:  11.1 LV IVS:        0.70 cm  LV e' lateral:   2.94 cm/s LVOT diam:     1.90 cm  LV E/e' lateral: 18.8 LV SV:         39 LV SV Index:   22 LVOT Area:     2.84 cm  RIGHT VENTRICLE RV Basal diam:  2.60 cm TAPSE (M-mode): 1.9 cm LEFT ATRIUM             Index       RIGHT ATRIUM          Index LA diam:        2.40 cm 1.40 cm/m  RA Area:     7.88 cm LA Vol (A2C):   36.8 ml 21.42 ml/m RA Volume:   13.50 ml 7.86 ml/m LA Vol (A4C):   27.3 ml 15.89 ml/m LA Biplane Vol: 32.6 ml 18.97 ml/m  AORTIC VALVE                   PULMONIC VALVE AV Area (Vmax):    2.01 cm    PV Vmax:          0.79 m/s AV Area (Vmean):   2.01 cm    PV Vmean:         53.200 cm/s AV Area (VTI):     1.98 cm    PV VTI:           0.153 m AV Vmax:           105.00 cm/s PV Peak grad:     2.5 mmHg AV Vmean:          73.900 cm/s PV Mean grad:     1.0 mmHg AV VTI:  0.195 m     PR End Diast Vel: 5.57 msec AV Peak Grad:      4.4 mmHg AV Mean Grad:      2.0 mmHg LVOT Vmax:         74.40 cm/s LVOT Vmean:        52.500 cm/s LVOT VTI:          0.136 m LVOT/AV VTI ratio: 0.70 AI PHT:            416 msec  AORTA Ao Root diam: 3.00 cm MITRAL VALVE               TRICUSPID VALVE MV Area (PHT): 4.49 cm    TR Peak grad:   27.2 mmHg MV Area VTI:   3.17 cm    TR Vmax:        261.00 cm/s MV Peak grad:  1.1 mmHg MV Mean grad:  1.0 mmHg    SHUNTS MV Vmax:       0.53 m/s    Systemic VTI:  0.14 m MV Vmean:      36.8 cm/s   Systemic Diam: 1.90 cm MV Decel Time: 169 msec MV E velocity: 55.30 cm/s MV A velocity: 48.80 cm/s MV E/A ratio:  1.13 Jovann Luse D Raequon Catanzaro MD Electronically signed by Yolonda Kida MD Signature Date/Time: 06/06/2021/1:36:37 PM    Final      Echo low normal left ventricular function lateral hypo-EF around 50%  TELEMETRY: Normal sinus rhythm  rate of 80:  ASSESSMENT AND PLAN:  Principal Problem:   Acute ST elevation myocardial infarction (STEMI) of lateral wall (HCC) Active Problems:   Diabetes mellitus type 2, uncomplicated (HCC)   PAF (paroxysmal atrial fibrillation) (HCC)   Chronic anticoagulation   Protein-calorie malnutrition, severe Multivessel coronary artery disease Generalized weakness Lung cancer  Plan Recommend transfer to telemetry Increase activity ambulate in the halls physical therapy Maintain aspirin Brilinta resume Eliquis Agree with Crestor for statin management and care Status post PCI and stent to circumflex with DES continue Brilinta for 12 months aspirin for 30 days Continue diabetes management and control Anticipate discharge if stable tomorrow   Yolonda Kida, MD 06/07/2021 8:25 AM

## 2021-06-07 NOTE — Progress Notes (Signed)
PROGRESS NOTE    Scott Gross  UUV:[id number] DOB: 02-22-1934 DOA: 06/06/2021 PCP: [name], MD   Chief complaint.  Chest pain. Brief Narrative:   Scott Gross is a 87 y.o. male with medical history significant for DM, HTN, paroxysmal A. fib on Eliquis, CAD with prior stent to LAD, V. fib cardiac arrest, admitted s/p cardiac cath for lateral STEMI, after presenting to the ED with a 2-hour history of left-sided chest pain radiating to the shoulder.  He underwent stent placement to the left circumflex which was completely occluded and was noted to have disease in the LAD and RCA to be treated medically per Dr. Fletcher Anon.  Patient was transferred from the Cath Lab to stepdown in stable condition.  Assessment & Plan:   Principal Problem:   Acute ST elevation myocardial infarction (STEMI) of lateral wall (HCC) Active Problems:   Diabetes mellitus type 2, uncomplicated (HCC)   PAF (paroxysmal atrial fibrillation) (HCC)   Chronic anticoagulation   Protein-calorie malnutrition, severe  #1.  STEMI. Paroxysmal atrial fibrillation. Acute systolic congestive heart failure secondary to STEMI. Patient is status post heart cath, decided to treat medically. Echocardiogram showed ejection fraction 45 to 50%.  Currently patient does not have any volume overload.  We will continue treatment with aspirin and Brilinta.  Also on Crestor. Beta-blocker also added for systolic dysfunction. Anticoagulation was discontinued by cardiology  #2.  Type 2 diabetes. Continue current regimen.   DVT prophylaxis: Lovenox Code Status: full Family Communication: Wife at the bedside Disposition Plan:    Status is: Inpatient  Remains inpatient appropriate because:Inpatient level of care appropriate due to severity of illness  Dispo: The patient is from: Home              Anticipated d/c is to: Home              Patient currently is not medically stable to d/c.   Difficult to place patient No         I/O last 3 completed shifts: In: 173.3 [I.V.:173.3] Out: 2225 [Urine:2225] Total I/O In: 480 [P.O.:480] Out: -      Consultants:  Cardiology  Procedures: Heart cath  Antimicrobials: None  Subjective: Patient doing well today, patient did not have any pain in the chest, no short of breath.  No cough. Abdominal pain nausea vomiting. No dysuria hematuria   Objective: Vitals:   06/07/21 1000 06/07/21 1100 06/07/21 1200 06/07/21 1300  BP: 100/64 107/71 112/64 107/72  Pulse: 92 87 83 82  Resp: 20 (!) 22 20 (!) 24  Temp:      TempSrc:      SpO2: 96% 97% 97% 100%  Weight:      Height:        Intake/Output Summary (Last 24 hours) at 06/07/2021 1428 Last data filed at 06/07/2021 1300 Gross per 24 hour  Intake 618.67 ml  Output 1000 ml  Net -381.33 ml   Filed Weights   06/06/21 0037 06/06/21 0215  Weight: 59.4 kg 58.8 kg    Examination:  General exam: Appears calm and comfortable  Respiratory system: Clear to auscultation. Respiratory effort normal. Cardiovascular system: S1 & S2 heard, RRR. No JVD, murmurs, rubs, gallops or clicks. No pedal edema. Gastrointestinal system: Abdomen is nondistended, soft and nontender. No organomegaly or masses felt. Normal bowel sounds heard. Central nervous system: Alert and oriented. No focal neurological deficits. Extremities: Symmetric 5 x 5 power. Skin: No rashes, lesions or ulcers Psychiatry: Judgement and insight appear  normal. Mood & affect appropriate.     Data Reviewed: I have personally reviewed following labs and imaging studies  CBC: Recent Labs  Lab 06/06/21 0033 06/06/21 0534 06/07/21 0504  WBC 17.1* 13.5* 12.7*  NEUTROABS 14.1*  --   --   HGB 16.8 15.4 14.9  HCT 47.3 43.1 41.8  MCV 87.4 88.3 88.7  PLT 217 201 263   Basic Metabolic Panel: Recent Labs  Lab 06/06/21 0033 06/06/21 0534 06/07/21 0504  NA 132* 134* 135  K 3.3* 3.5 3.7  CL 92* 99 99  CO2 29 24 29   GLUCOSE 212* 143* 147*  BUN 13 10 9    CREATININE 0.80 0.71 0.83  CALCIUM 8.9 8.5* 8.8*  MG  --   --  2.1   GFR: Estimated Creatinine Clearance: 54.1 mL/min (by C-G formula based on SCr of 0.83 mg/dL). Liver Function Tests: Recent Labs  Lab 06/06/21 0033  AST 21  ALT 12  ALKPHOS 99  BILITOT 1.1  PROT 7.6  ALBUMIN 4.4   No results for input(s): LIPASE, AMYLASE in the last 168 hours. No results for input(s): AMMONIA in the last 168 hours. Coagulation Profile: Recent Labs  Lab 06/06/21 0033  INR 1.0   Cardiac Enzymes: No results for input(s): CKTOTAL, CKMB, CKMBINDEX, TROPONINI in the last 168 hours. BNP (last 3 results) No results for input(s): PROBNP in the last 8760 hours. HbA1C: Recent Labs    06/06/21 0523  HGBA1C 6.2*   CBG: Recent Labs  Lab 06/06/21 1121 06/06/21 1632 06/06/21 2107 06/07/21 0748 06/07/21 1126  GLUCAP 168* 148* 167* 146* 159*   Lipid Profile: Recent Labs    06/06/21 0033  CHOL 176  HDL 35*  LDLCALC 101*  TRIG 202*  CHOLHDL 5.0   Thyroid Function Tests: No results for input(s): TSH, T4TOTAL, FREET4, T3FREE, THYROIDAB in the last 72 hours. Anemia Panel: No results for input(s): VITAMINB12, FOLATE, FERRITIN, TIBC, IRON, RETICCTPCT in the last 72 hours. Sepsis Labs: No results for input(s): PROCALCITON, LATICACIDVEN in the last 168 hours.  Recent Results (from the past 240 hour(s))  Resp Panel by RT-PCR (Flu A&B, Covid) Nasopharyngeal Swab     Status: None   Collection Time: 06/06/21 12:33 AM   Specimen: Nasopharyngeal Swab; Nasopharyngeal(NP) swabs in vial transport medium  Result Value Ref Range Status   SARS Coronavirus 2 by RT PCR NEGATIVE NEGATIVE Final    Comment: (NOTE) SARS-CoV-2 target nucleic acids are NOT DETECTED.  The SARS-CoV-2 RNA is generally detectable in upper respiratory specimens during the acute phase of infection. The lowest concentration of SARS-CoV-2 viral copies this assay can detect is 138 copies/mL. A negative result does not preclude  SARS-Cov-2 infection and should not be used as the sole basis for treatment or other patient management decisions. A negative result may occur with  improper specimen collection/handling, submission of specimen other than nasopharyngeal swab, presence of viral mutation(s) within the areas targeted by this assay, and inadequate number of viral copies(<138 copies/mL). A negative result must be combined with clinical observations, patient history, and epidemiological information. The expected result is Negative.  Fact Sheet for Patients:  EntrepreneurPulse.com.au  Fact Sheet for Healthcare Providers:  IncredibleEmployment.be  This test is no t yet approved or cleared by the [place] FDA and  has been authorized for detection and/or diagnosis of SARS-CoV-2 by FDA under an Emergency Use Authorization (EUA). This EUA will remain  in effect (meaning this test can be used) for the duration of the COVID-19 declaration  under Section 564(b)(1) of the Act, 21 U.S.C.section 360bbb-3(b)(1), unless the authorization is terminated  or revoked sooner.       Influenza A by PCR NEGATIVE NEGATIVE Final   Influenza B by PCR NEGATIVE NEGATIVE Final    Comment: (NOTE) The Xpert Xpress SARS-CoV-2/FLU/RSV plus assay is intended as an aid in the diagnosis of influenza from Nasopharyngeal swab specimens and should not be used as a sole basis for treatment. Nasal washings and aspirates are unacceptable for Xpert Xpress SARS-CoV-2/FLU/RSV testing.  Fact Sheet for Patients: EntrepreneurPulse.com.au  Fact Sheet for Healthcare Providers: IncredibleEmployment.be  This test is not yet approved or cleared by the [place] FDA and has been authorized for detection and/or diagnosis of SARS-CoV-2 by FDA under an Emergency Use Authorization (EUA). This EUA will remain in effect (meaning this test can be used) for the duration of  the COVID-19 declaration under Section 564(b)(1) of the Act, 21 U.S.C. section 360bbb-3(b)(1), unless the authorization is terminated or revoked.  Performed at [facility], [address] [place], [place] [address]   MRSA Next Gen by PCR, Nasal     Status: None   Collection Time: 06/06/21  2:15 AM   Specimen: Nasal Mucosa; Nasal Swab  Result Value Ref Range Status   MRSA by PCR Next Gen NOT DETECTED NOT DETECTED Final    Comment: (NOTE) The GeneXpert MRSA Assay (FDA approved for NASAL specimens only), is one component of a comprehensive MRSA colonization surveillance program. It is not intended to diagnose MRSA infection nor to guide or monitor treatment for MRSA infections. Test performance is not FDA approved in patients less than 41 years old. Performed at [facility], [address], [place], [place] [address]          Radiology Studies: CARDIAC CATHETERIZATION  Result Date: 06/06/2021   Prox RCA lesion is 60% stenosed.   Prox RCA to Mid RCA lesion is 50% stenosed.   Dist RCA lesion is 30% stenosed.   Mid LAD-1 lesion is 10% stenosed.   Mid LAD-2 lesion is 60% stenosed.   Prox LAD to Mid LAD lesion is 70% stenosed.   Prox Cx to Mid Cx lesion is 100% stenosed.   A drug-eluting stent was successfully placed using a STENT ONYX FRONTIER [id number].   Post intervention, there is a 0% residual stenosis.   There is mild left ventricular systolic dysfunction.   LV end diastolic pressure is moderately elevated.   The left ventricular ejection fraction is 45-50% by visual estimate. 1.  Significant underlying three-vessel coronary artery disease.  The culprit for lateral STEMI is an occluded proximal left circumflex.  Patent stent in the mid LAD with 70% stenosis in the proximal LAD and 60% disease in the mid LAD distal to the previously placed stent.  In addition, there is borderline disease in the right coronary artery. 2.  Mildly reduced LV systolic function with apical  lateral hypokinesis.  Moderately elevated left ventricular end-diastolic pressure. 3.  Successful angioplasty and drug-eluting stent placement to the left circumflex. Recommendations: Treat with dual antiplatelet therapy with aspirin and ticagrelor for now.  Eliquis can be resumed in 1 to 2 days for paroxysmal atrial fibrillation.  Once Eliquis is resumed, recommend stopping aspirin to minimize the risk of bleeding on triple therapy. The rest of his coronary artery disease can likely be treated medically but if he has refractory anginal symptoms, PCI of the proximal LAD can be considered.  DG Chest Port 1 View  Result  Date: 06/06/2021 CLINICAL DATA:  Chest pain, right shoulder pain EXAM: PORTABLE CHEST 1 VIEW COMPARISON:  10/04/2020 FINDINGS: Left suprahilar mass adjacent to the aortic knob with fiducial markers is again identified and is stable since prior examination. Left-sided volume loss is unchanged. No superimposed focal pulmonary infiltrate. No pneumothorax or pleural effusion. Cardiac size within normal limits. Pulmonary vascularity is normal. No acute bone abnormality. IMPRESSION: No active disease. Stable appearance of left suprahilar mass and associated left-sided volume loss. Electronically Signed   By: Fidela Salisbury M.D.   On: 06/06/2021 01:00   ECHOCARDIOGRAM COMPLETE  Result Date: 06/06/2021    ECHOCARDIOGRAM REPORT   Patient Name:   Scott Gross Date of Exam: 06/06/2021 Medical Rec #:  [id number]       Height:       69.0 in Accession #:    [id number]      Weight:       129.6 lb Date of Birth:  03-25-1934       BSA:          1.718 m Patient Age:    68 years        BP:           106/69 mmHg Patient Gender: M               HR:           82 bpm. Exam Location:  [facility] Procedure: 2D Echo, Color Doppler and Cardiac Doppler Indications:     I21.9 Acute myocardial infarction, unspecified  History:         Patient has no prior history of Echocardiogram examinations.                  CAD; Risk  Factors:Hypertension and Dyslipidemia.  Sonographer:     Charmayne Sheer Referring Phys:  [id number] Rise Mu Diagnosing Phys: Yolonda Kida MD  Sonographer Comments: Global longitudinal strain was attempted. IMPRESSIONS  1. Lateral Inferior Hypo.  2. Left ventricular ejection fraction, by estimation, is 45 to 50%. The left ventricle has mildly decreased function. The left ventricle demonstrates regional wall motion abnormalities (see scoring diagram/findings for description). Left ventricular diastolic parameters were normal.  3. Right ventricular systolic function is normal. The right ventricular size is normal.  4. The mitral valve is normal in structure. No evidence of mitral valve regurgitation.  5. The aortic valve is normal in structure. Aortic valve regurgitation is not visualized. Mild aortic valve sclerosis is present, with no evidence of aortic valve stenosis. FINDINGS  Left Ventricle: Left ventricular ejection fraction, by estimation, is 45 to 50%. The left ventricle has mildly decreased function. The left ventricle demonstrates regional wall motion abnormalities. The left ventricular internal cavity size was normal in size. There is borderline concentric left ventricular hypertrophy. Left ventricular diastolic parameters were normal. Right Ventricle: The right ventricular size is normal. No increase in right ventricular wall thickness. Right ventricular systolic function is normal. Left Atrium: Left atrial size was normal in size. Right Atrium: Right atrial size was normal in size. Pericardium: There is no evidence of pericardial effusion. Mitral Valve: The mitral valve is normal in structure. No evidence of mitral valve regurgitation. MV peak gradient, 1.1 mmHg. The mean mitral valve gradient is 1.0 mmHg. Tricuspid Valve: The tricuspid valve is normal in structure. Tricuspid valve regurgitation is trivial. Aortic Valve: The aortic valve is normal in structure. Aortic valve regurgitation is not  visualized. Aortic regurgitation PHT measures 416 msec. Mild aortic  valve sclerosis is present, with no evidence of aortic valve stenosis. Aortic valve mean gradient  measures 2.0 mmHg. Aortic valve peak gradient measures 4.4 mmHg. Aortic valve area, by VTI measures 1.98 cm. Pulmonic Valve: The pulmonic valve was normal in structure. Pulmonic valve regurgitation is not visualized. Aorta: The ascending aorta was not well visualized. IAS/Shunts: No atrial level shunt detected by color flow Doppler. Additional Comments: Lateral Inferior Hypo.  LEFT VENTRICLE PLAX 2D LVIDd:         3.80 cm  Diastology LVIDs:         2.50 cm  LV e' medial:    5.00 cm/s LV PW:         0.70 cm  LV E/e' medial:  11.1 LV IVS:        0.70 cm  LV e' lateral:   2.94 cm/s LVOT diam:     1.90 cm  LV E/e' lateral: 18.8 LV SV:         39 LV SV Index:   22 LVOT Area:     2.84 cm  RIGHT VENTRICLE RV Basal diam:  2.60 cm TAPSE (M-mode): 1.9 cm LEFT ATRIUM             Index       RIGHT ATRIUM          Index LA diam:        2.40 cm 1.40 cm/m  RA Area:     7.88 cm LA Vol (A2C):   36.8 ml 21.42 ml/m RA Volume:   13.50 ml 7.86 ml/m LA Vol (A4C):   27.3 ml 15.89 ml/m LA Biplane Vol: 32.6 ml 18.97 ml/m  AORTIC VALVE                   PULMONIC VALVE AV Area (Vmax):    2.01 cm    PV Vmax:          0.79 m/s AV Area (Vmean):   2.01 cm    PV Vmean:         53.200 cm/s AV Area (VTI):     1.98 cm    PV VTI:           0.153 m AV Vmax:           105.00 cm/s PV Peak grad:     2.5 mmHg AV Vmean:          73.900 cm/s PV Mean grad:     1.0 mmHg AV VTI:            0.195 m     PR End Diast Vel: 5.57 msec AV Peak Grad:      4.4 mmHg AV Mean Grad:      2.0 mmHg LVOT Vmax:         74.40 cm/s LVOT Vmean:        52.500 cm/s LVOT VTI:          0.136 m LVOT/AV VTI ratio: 0.70 AI PHT:            416 msec  AORTA Ao Root diam: 3.00 cm MITRAL VALVE               TRICUSPID VALVE MV Area (PHT): 4.49 cm    TR Peak grad:   27.2 mmHg MV Area VTI:   3.17 cm    TR Vmax:         261.00 cm/s MV Peak grad:  1.1 mmHg MV Mean grad:  1.0 mmHg  SHUNTS MV Vmax:       0.53 m/s    Systemic VTI:  0.14 m MV Vmean:      36.8 cm/s   Systemic Diam: 1.90 cm MV Decel Time: 169 msec MV E velocity: 55.30 cm/s MV A velocity: 48.80 cm/s MV E/A ratio:  1.13 Dwayne D Callwood MD Electronically signed by Yolonda Kida MD Signature Date/Time: 06/06/2021/1:36:37 PM    Final         Scheduled Meds:  aspirin  81 mg Oral Daily   Chlorhexidine Gluconate Cloth  6 each Topical Q0600   enoxaparin (LOVENOX) injection  40 mg Subcutaneous Q24H   feeding supplement  237 mL Oral TID BM   insulin aspart  0-5 Units Subcutaneous QHS   insulin aspart  0-9 Units Subcutaneous TID WC   metoprolol succinate  50 mg Oral Daily   multivitamin with minerals  1 tablet Oral Daily   pantoprazole  40 mg Oral Daily   rosuvastatin  20 mg Oral Daily   sodium chloride flush  3 mL Intravenous Q12H   ticagrelor  90 mg Oral BID   Continuous Infusions:  sodium chloride 10 mL/hr at 06/06/21 0045   sodium chloride       LOS: 1 day    Time spent: 27 minutes    Sharen Hones, MD [facility]   To contact the attending provider between 7A-7P or the covering provider during after hours 7P-7A, please log into the web site [email or web address] and access using universal [place] password for that web site. If you do not have the password, please call the hospital operator.  06/07/2021, 2:28 PM

## 2021-06-08 ENCOUNTER — Inpatient Hospital Stay (HOSPITAL_COMMUNITY)
Admission: EM | Admit: 2021-06-08 | Discharge: 2021-06-09 | Disposition: A | Discharge status: Home / Self Care | Payer: Medicare HMO | Source: Home / Self Care | Attending: Internal Medicine | Admitting: Internal Medicine

## 2021-06-08 ENCOUNTER — Emergency Department: Payer: Medicare HMO

## 2021-06-08 ENCOUNTER — Other Ambulatory Visit: Payer: Self-pay

## 2021-06-08 DIAGNOSIS — Z8674 Personal history of sudden cardiac arrest: Secondary | ICD-10-CM

## 2021-06-08 DIAGNOSIS — Z681 Body mass index (BMI) 19 or less, adult: Secondary | ICD-10-CM

## 2021-06-08 DIAGNOSIS — Z955 Presence of coronary angioplasty implant and graft: Secondary | ICD-10-CM

## 2021-06-08 DIAGNOSIS — I10 Essential (primary) hypertension: Secondary | ICD-10-CM | POA: Diagnosis present

## 2021-06-08 DIAGNOSIS — Z7982 Long term (current) use of aspirin: Secondary | ICD-10-CM

## 2021-06-08 DIAGNOSIS — I480 Paroxysmal atrial fibrillation: Principal | ICD-10-CM | POA: Diagnosis present

## 2021-06-08 DIAGNOSIS — I2129 ST elevation (STEMI) myocardial infarction involving other sites: Secondary | ICD-10-CM | POA: Diagnosis present

## 2021-06-08 DIAGNOSIS — Z87891 Personal history of nicotine dependence: Secondary | ICD-10-CM

## 2021-06-08 DIAGNOSIS — E785 Hyperlipidemia, unspecified: Secondary | ICD-10-CM | POA: Diagnosis present

## 2021-06-08 DIAGNOSIS — I4891 Unspecified atrial fibrillation: Secondary | ICD-10-CM | POA: Diagnosis not present

## 2021-06-08 DIAGNOSIS — M1990 Unspecified osteoarthritis, unspecified site: Secondary | ICD-10-CM | POA: Diagnosis present

## 2021-06-08 DIAGNOSIS — Z8249 Family history of ischemic heart disease and other diseases of the circulatory system: Secondary | ICD-10-CM

## 2021-06-08 DIAGNOSIS — Z79899 Other long term (current) drug therapy: Secondary | ICD-10-CM

## 2021-06-08 DIAGNOSIS — I959 Hypotension, unspecified: Secondary | ICD-10-CM | POA: Diagnosis present

## 2021-06-08 DIAGNOSIS — Z20822 Contact with and (suspected) exposure to covid-19: Secondary | ICD-10-CM | POA: Diagnosis present

## 2021-06-08 DIAGNOSIS — I252 Old myocardial infarction: Secondary | ICD-10-CM

## 2021-06-08 DIAGNOSIS — E43 Unspecified severe protein-calorie malnutrition: Secondary | ICD-10-CM | POA: Diagnosis present

## 2021-06-08 DIAGNOSIS — E119 Type 2 diabetes mellitus without complications: Secondary | ICD-10-CM

## 2021-06-08 DIAGNOSIS — Z7902 Long term (current) use of antithrombotics/antiplatelets: Secondary | ICD-10-CM

## 2021-06-08 DIAGNOSIS — I2510 Atherosclerotic heart disease of native coronary artery without angina pectoris: Secondary | ICD-10-CM | POA: Diagnosis present

## 2021-06-08 DIAGNOSIS — Z85118 Personal history of other malignant neoplasm of bronchus and lung: Secondary | ICD-10-CM

## 2021-06-08 DIAGNOSIS — Z7901 Long term (current) use of anticoagulants: Secondary | ICD-10-CM

## 2021-06-08 DIAGNOSIS — Z888 Allergy status to other drugs, medicaments and biological substances status: Secondary | ICD-10-CM

## 2021-06-08 DIAGNOSIS — I5021 Acute systolic (congestive) heart failure: Secondary | ICD-10-CM | POA: Diagnosis not present

## 2021-06-08 DIAGNOSIS — Z923 Personal history of irradiation: Secondary | ICD-10-CM

## 2021-06-08 LAB — CBC WITH DIFFERENTIAL/PLATELET
Abs Immature Granulocytes: 0.06 10*3/uL (ref 0.00–0.07)
Basophils Absolute: 0 10*3/uL (ref 0.0–0.1)
Basophils Relative: 0 %
Eosinophils Absolute: 0.1 10*3/uL (ref 0.0–0.5)
Eosinophils Relative: 1 %
HCT: 41.3 % (ref 39.0–52.0)
Hemoglobin: 14.9 g/dL (ref 13.0–17.0)
Immature Granulocytes: 1 %
Lymphocytes Relative: 11 %
Lymphs Abs: 1.3 10*3/uL (ref 0.7–4.0)
MCH: 31.6 pg (ref 26.0–34.0)
MCHC: 36.1 g/dL — ABNORMAL HIGH (ref 30.0–36.0)
MCV: 87.7 fL (ref 80.0–100.0)
Monocytes Absolute: 1.2 10*3/uL — ABNORMAL HIGH (ref 0.1–1.0)
Monocytes Relative: 10 %
Neutro Abs: 9.2 10*3/uL — ABNORMAL HIGH (ref 1.7–7.7)
Neutrophils Relative %: 77 %
Platelets: 189 10*3/uL (ref 150–400)
RBC: 4.71 MIL/uL (ref 4.22–5.81)
RDW: 13.2 % (ref 11.5–15.5)
WBC: 11.9 10*3/uL — ABNORMAL HIGH (ref 4.0–10.5)
nRBC: 0 % (ref 0.0–0.2)

## 2021-06-08 LAB — GLUCOSE, CAPILLARY
Glucose-Capillary: 135 mg/dL — ABNORMAL HIGH (ref 70–99)
Glucose-Capillary: 79 mg/dL (ref 70–99)

## 2021-06-08 LAB — BASIC METABOLIC PANEL
Anion gap: 7 (ref 5–15)
Anion gap: 9 (ref 5–15)
BUN: 12 mg/dL (ref 8–23)
BUN: 17 mg/dL (ref 8–23)
CO2: 26 mmol/L (ref 22–32)
CO2: 27 mmol/L (ref 22–32)
Calcium: 8.3 mg/dL — ABNORMAL LOW (ref 8.9–10.3)
Calcium: 8.7 mg/dL — ABNORMAL LOW (ref 8.9–10.3)
Chloride: 100 mmol/L (ref 98–111)
Chloride: 96 mmol/L — ABNORMAL LOW (ref 98–111)
Creatinine, Ser: 0.73 mg/dL (ref 0.61–1.24)
Creatinine, Ser: 1.01 mg/dL (ref 0.61–1.24)
GFR, Estimated: >60 mL/min (ref >60)
GFR, Estimated: >60 mL/min (ref >60)
Glucose, Bld: 125 mg/dL — ABNORMAL HIGH (ref 70–99)
Glucose, Bld: 149 mg/dL — ABNORMAL HIGH (ref 70–99)
Potassium: 3.3 mmol/L — ABNORMAL LOW (ref 3.5–5.1)
Potassium: 3.7 mmol/L (ref 3.5–5.1)
Sodium: 133 mmol/L — ABNORMAL LOW (ref 135–145)
Sodium: 132 mmol/L — ABNORMAL LOW (ref 135–145)

## 2021-06-08 LAB — CBC
HCT: 40.7 % (ref 39.0–52.0)
Hemoglobin: 14.1 g/dL (ref 13.0–17.0)
MCH: 30.4 pg (ref 26.0–34.0)
MCHC: 34.6 g/dL (ref 30.0–36.0)
MCV: 87.7 fL (ref 80.0–100.0)
Platelets: 180 10*3/uL (ref 150–400)
RBC: 4.64 MIL/uL (ref 4.22–5.81)
RDW: 13.2 % (ref 11.5–15.5)
WBC: 12 10*3/uL — ABNORMAL HIGH (ref 4.0–10.5)
nRBC: 0 % (ref 0.0–0.2)

## 2021-06-08 LAB — RESP PANEL BY RT-PCR (FLU A&B, COVID) ARPGX2
Influenza A by PCR: NEGATIVE
Influenza B by PCR: NEGATIVE
SARS Coronavirus 2 by RT PCR: NEGATIVE

## 2021-06-08 LAB — TROPONIN I (HIGH SENSITIVITY): Troponin I (High Sensitivity): 12547 ng/L (ref <18)

## 2021-06-08 LAB — MAGNESIUM
Magnesium: 2.1 mg/dL (ref 1.7–2.4)
Magnesium: 2.1 mg/dL (ref 1.7–2.4)

## 2021-06-08 MED ORDER — POTASSIUM CHLORIDE CRYS ER 20 MEQ PO TBCR: 40.0000 meq | EXTENDED_RELEASE_TABLET | ORAL | Status: DC

## 2021-06-08 MED ORDER — DILTIAZEM HCL-DEXTROSE 125-5 MG/125ML-% IV SOLN (PREMIX)
5.0000 mg/h | INTRAVENOUS | Status: DC
  Administered 2021-06-08: 5 mg/h via INTRAVENOUS
  Filled 2021-06-08: qty 125

## 2021-06-08 MED ORDER — DILTIAZEM HCL 25 MG/5ML IV SOLN
10.0000 mg | Freq: Once | INTRAVENOUS | Status: AC
  Administered 2021-06-08: 10 mg via INTRAVENOUS
  Filled 2021-06-08: qty 5

## 2021-06-08 MED ORDER — ASPIRIN 81 MG PO CHEW
81.0000 mg | CHEWABLE_TABLET | Freq: Every day | ORAL | 0 refills | Status: DC
Start: 2021-06-08 — End: 2021-08-03

## 2021-06-08 MED ORDER — TICAGRELOR 90 MG PO TABS: 90.0000 mg | ORAL_TABLET | Freq: Two times a day (BID) | ORAL | 0 refills | Status: DC

## 2021-06-08 NOTE — ED Provider Notes (Signed)
[facility] Emergency Department Provider Note   ____________________________________________   Event Date/Time   First MD Initiated Contact with Patient 06/08/21 2041     (approximate)  I have reviewed the triage vital signs and the nursing notes.   HISTORY  Chief Complaint Tachycardia    HPI Scott Gross is a 87 y.o. male with past medical history of hypertension, diabetes, CAD, paroxysmal atrial fibrillation on Eliquis, and ventricular tachycardia who presents to the ED complaining of palpitations.  Patient was discharged from the hospital earlier today following admission for ST elevation MI, states he had been doing well at the time of discharge.  About 1 to 2 hours prior to arrival, patient suddenly started feeling like his heart was racing.  He denies any associated chest pain or shortness of breath, has not had any lightheadedness or syncope.  Family spoke with patient's cardiologist, Dr. Clayborn Bigness, over the phone who recommended giving an oral dose of metoprolol.  He continued to feel like his heart was racing despite this and family reports heart rate as high as the 160s at home.  Patient's Eliquis has recently been held due to initiation of Brilinta.        Past Medical History:  Diagnosis Date   Arthritis    CAD (coronary artery disease) Nov. 12, 2012   Inferior ST elevation MI in November of 2012 with ventricular fibrillation arrest. Status post drug-eluting stent placement to the mid LAD. Most recent cardiac catheterization in June of this year showed patent stent with minimal restenosis, 75% proximal LAD and 80% distal LAD which were unchanged from before. Mild disease in the left circumflex and moderate RCA disease. Ejection fraction was 55%.   Cancer of upper lobe of left lung ([place]) 11/2016   Rad tx's.   Cardiac arrest [facility]) Nov. 2012   x 2    Cardiac arrest - ventricular fibrillation    HOH (hard of hearing)    Left Hearing Aid    Hyperlipidemia    Hypertension    Post PTCA 2013   x2   ST elevation MI (STEMI) ([place]) 08/26/2011    Patient Active Problem List   Diagnosis Date Noted   Acute ST elevation myocardial infarction (STEMI) of lateral wall ([place]) 06/06/2021   Chronic anticoagulation 06/06/2021   Protein-calorie malnutrition, severe 06/06/2021   Malnutrition of moderate degree 06/22/2019   CAP (community acquired pneumonia) 06/21/2019   SIRS (systemic inflammatory response syndrome) ([place]) 05/23/2019   Atrial fibrillation with RVR (HCC)    PAF (paroxysmal atrial fibrillation) ([place]) 05/20/2019   Cardiac arrest ([place])    Diabetes mellitus type 2, uncomplicated ([place]) 53/29/9242   Angina pectoris ([place]) 12/17/2016   Adenopathy    Cancer of upper lobe of left lung ([place]) 09/10/2016   Hyperlipidemia    Hypertension    CAD (coronary artery disease) 08/27/2011   Myocardial infarction ([place]) 08/16/2011    Past Surgical History:  Procedure Laterality Date   CARDIAC CATHETERIZATION  2013   @ [place]; [facility] s/p stent    CORONARY ANGIOPLASTY     CORONARY/GRAFT ACUTE MI REVASCULARIZATION N/A 06/06/2021   Procedure: Coronary/Graft Acute MI Revascularization;  Surgeon: Wellington Hampshire, MD;  Location: [facility];  Service: Cardiovascular;  Laterality: N/A;   ELECTROMAGNETIC NAVIGATION BROCHOSCOPY N/A 11/27/2016   Procedure: ELECTROMAGNETIC NAVIGATION BRONCHOSCOPY;  Surgeon: [name], MD;  Location: [facility] ORS;  Service: Cardiopulmonary;  Laterality: N/A;   LEFT HEART CATH AND CORONARY ANGIOGRAPHY N/A 06/06/2021  Procedure: LEFT HEART CATH AND CORONARY ANGIOGRAPHY;  Surgeon: Wellington Hampshire, MD;  Location: [place] [facility];  Service: Cardiovascular;  Laterality: N/A;    Prior to Admission medications   Medication Sig Start Date End Date Taking? Authorizing Provider  albuterol (VENTOLIN HFA) 108 (90 Base) MCG/ACT inhaler Inhale into the lungs every 6 (six) hours as needed. 01/24/21   [provider]  aspirin 81 MG chewable tablet Chew 1 tablet (81 mg total) by mouth daily. 06/08/21   Sharen Hones, MD  losartan-hydrochlorothiazide (HYZAAR) 50-12.5 MG tablet Take 1 tablet by mouth daily. 10/10/20   [provider]  metoprolol succinate (TOPROL-XL) 50 MG 24 hr tablet Take 50 mg by mouth daily. 05/30/19   [provider]  ticagrelor (BRILINTA) 90 MG TABS tablet Take 1 tablet (90 mg total) by mouth 2 (two) times daily. 06/08/21   Sharen Hones, MD    Allergies Statins  Family History  Problem Relation Age of Onset   Heart attack Mother    Heart attack Father     Social History Social History   Tobacco Use   Smoking status: Former    Packs/day: 1.00    Types: Cigarettes    Quit date: 08/27/2011    Years since quitting: 9.7   Smokeless tobacco: Never  Vaping Use   Vaping Use: Never used  Substance Use Topics   Alcohol use: No   Drug use: No    Review of Systems  Constitutional: No fever/chills Eyes: No visual changes. ENT: No sore throat. Cardiovascular: Denies chest pain.  Positive for palpitations. Respiratory: Denies shortness of breath. Gastrointestinal: No abdominal pain.  No nausea, no vomiting.  No diarrhea.  No constipation. Genitourinary: Negative for dysuria. Musculoskeletal: Negative for back pain. Skin: Negative for rash. Neurological: Negative for headaches, focal weakness or numbness.  ____________________________________________   PHYSICAL EXAM:  VITAL SIGNS: ED Triage Vitals  Enc Vitals Group     BP 06/08/21 2039 105/69     Pulse Rate 06/08/21 2039 (!) 159     Resp 06/08/21 2039 (!) 24     Temp 06/08/21 2039 98.8 F (37.1 C)     Temp Source 06/08/21 2039 Oral     SpO2 06/08/21 2039 100 %     Weight 06/08/21 2040 138 lb (62.6 kg)     Height 06/08/21 2040 5\' 11"  (1.803 m)     Head Circumference --      Peak Flow --      Pain Score 06/08/21 2040 0     Pain Loc --      Pain Edu? --      Excl. in [place]? --      Constitutional: Alert and oriented. Eyes: Conjunctivae are normal. Head: Atraumatic. Nose: No congestion/rhinnorhea. Mouth/Throat: Mucous membranes are moist. Neck: Normal ROM Cardiovascular: Tachycardic, irregularly irregular rhythm. Grossly normal heart sounds.  2+ radial pulses bilaterally. Respiratory: Normal respiratory effort.  No retractions. Lungs CTAB. Gastrointestinal: Soft and nontender. No distention. Genitourinary: deferred Musculoskeletal: No lower extremity tenderness nor edema. Neurologic:  Normal speech and language. No gross focal neurologic deficits are appreciated. Skin:  Skin is warm, dry and intact. No rash noted. Psychiatric: Mood and affect are normal. Speech and behavior are normal.  ____________________________________________   LABS (all labs ordered are listed, but only abnormal results are displayed)  Labs Reviewed  CBC WITH DIFFERENTIAL/PLATELET - Abnormal; Notable for the following components:      Result Value   WBC 11.9 (*)  MCHC 36.1 (*)    Neutro Abs 9.2 (*)    Monocytes Absolute 1.2 (*)    All other components within normal limits  BASIC METABOLIC PANEL - Abnormal; Notable for the following components:   Sodium 132 (*)    Chloride 96 (*)    Glucose, Bld 149 (*)    Calcium 8.7 (*)    All other components within normal limits  TROPONIN I (HIGH SENSITIVITY) - Abnormal; Notable for the following components:   Troponin I (High Sensitivity) 12,547 (*)    All other components within normal limits  RESP PANEL BY RT-PCR (FLU A&B, COVID) ARPGX2  MAGNESIUM   ____________________________________________  EKG  ED ECG REPORT I, Blake Divine, the attending physician, personally viewed and interpreted this ECG.   Date: 06/08/2021  EKG Time: 20:39  Rate: 159  Rhythm: atrial fibrillation  Axis: RAD  Intervals:none  ST&T Change: None  ED ECG REPORT I, Blake Divine, the attending physician, personally viewed and interpreted this  ECG.   Date: 06/08/2021  EKG Time: 21:43  Rate: 89  Rhythm: normal sinus rhythm  Axis: Normal  Intervals:left anterior fascicular block  ST&T Change: None    PROCEDURES  Procedure(s) performed (including Critical Care):  .Critical Care  Date/Time: 06/08/2021 10:11 PM Performed by: Blake Divine, MD Authorized by: Blake Divine, MD   Critical care provider statement:    Critical care time (minutes):  45   Critical care time was exclusive of:  Separately billable procedures and treating other patients and teaching time   Critical care was necessary to treat or prevent imminent or life-threatening deterioration of the following conditions:  Cardiac failure   Critical care was time spent personally by me on the following activities:  Discussions with consultants, evaluation of patient's response to treatment, examination of patient, ordering and performing treatments and interventions, ordering and review of laboratory studies, ordering and review of radiographic studies, pulse oximetry, re-evaluation of patient's condition, obtaining history from patient or surrogate and review of old charts   I assumed direction of critical care for this patient from another provider in my specialty: no     Care discussed with: admitting provider     ____________________________________________   INITIAL IMPRESSION / [place] / ED COURSE      87 year old male with past medical history of hypertension, diabetes, CAD, paroxysmal atrial fibrillation on Eliquis, and ventricular tachycardia who presents to the ED with palpitations starting 1 to 2 hours prior to arrival.  EKG shows atrial fibrillation with RVR, no ischemic changes noted and patient denies any pain in his chest.  He will rapidly alternate between A. fib RVR and sinus tachycardia, case discussed with Dr. Clayborn Bigness of cardiology who agrees with plan to start him on a diltiazem drip for rate control.  Cardiology states that  patient may be started back on his Eliquis during admission.  Labs and chest x-ray are pending.  Chest x-ray reviewed by me and shows no infiltrate, edema, or effusion.  Labs are remarkable for elevated troponin, however this appears downtrending from his admission for MI 2 days ago.  No electrolyte abnormality noted.  Patient has converted to normal sinus rhythm on dilt drip, case discussed with hospitalist for admission.      ____________________________________________   FINAL CLINICAL IMPRESSION(S) / ED DIAGNOSES  Final diagnoses:  Atrial fibrillation with RVR [facility])     ED Discharge Orders     None        Note:  This document  was prepared using Systems analyst and may include unintentional dictation errors.    Blake Divine, MD 06/08/21 2211

## 2021-06-08 NOTE — ED Triage Notes (Signed)
Pt was brought in for irregular heart rate. Pt was discharged from ICU today after MI Monday with stent placement. Pt states has had to be defibrillated in the past for the same.

## 2021-06-08 NOTE — H&P (Signed)
History and Physical   Scott Gross QQV:[id number] DOB: Aug 27, 1934 DOA: 06/08/2021  Referring MD/NP/PA: Dr. Charna Archer  PCP: Scott Pink, MD   Outpatient Specialists: Dr. Velva Har[facility]  Patient coming from: Home  Chief Complaint: Palpitations  HPI: Scott Gross is a 87 y.o. male with medical history significant of acute ST elevation MI involving the lateral wall who was discharged today after cardiac cath, diabetes, paroxysmal atrial fibrillation, protein calorie malnutrition on chronic anticoagulation who was brought in secondary to palpitation.  Patient had history of A. fib previously on Eliquis.  He did have previous stent to LAD and now has lateral STEMI for which he was found to have disease in the LAD and RCA.  He had a stent placement to the left circumflex but the rest will be medically managed.  Patient left and came back.  He was found to be in A. fib with RVR.  Patient has had previous V. fib cardiac arrest but at this point appears to have mainly A. fib with RVR.  He previously has been on Eliquis but that was stopped after And he is on Brilinta.  ER physician has contacted cardiology and recommendation is to add the Eliquis.  He is currently on Cardizem drip and is being admitted to the medical service..  ED Course: Temperature is 98.8 blood pressure 88/59 initially currently 98/68.  Pulse 159 respirate of 24 and oxygen sat 96% room air.  White count 11.9 the rest of the CBC within normal sodium 132 potassium 3.7 chloride 96.  CO2 is 27 BUN 17 creatinine 1.01 calcium 8.7 and troponin 12,547 glucose 149 and INR 1.0.  Magnesium is 2.1.  Respiratory screen is within normal.  Chest x-ray showed no acute findings.  Mainly chronic findings.  Review of Systems: As per HPI otherwise 10 point review of systems negative.    Past Medical History:  Diagnosis Date   Arthritis    CAD (coronary artery disease) Nov. 12, 2012   Inferior ST elevation MI in November of 2012 with ventricular  fibrillation arrest. Status post drug-eluting stent placement to the mid LAD. Most recent cardiac catheterization in June of this year showed patent stent with minimal restenosis, 75% proximal LAD and 80% distal LAD which were unchanged from before. Mild disease in the left circumflex and moderate RCA disease. Ejection fraction was 55%.   Cancer of upper lobe of left lung ([place]) 11/2016   Rad tx's.   Cardiac arrest [facility]) Nov. 2012   x 2    Cardiac arrest - ventricular fibrillation    HOH (hard of hearing)    Left Hearing Aid   Hyperlipidemia    Hypertension    Post PTCA 2013   x2   ST elevation MI (STEMI) ([place]) 08/26/2011    Past Surgical History:  Procedure Laterality Date   CARDIAC CATHETERIZATION  2013   @ [facility]; [facility] s/p stent    CORONARY ANGIOPLASTY     CORONARY/GRAFT ACUTE MI REVASCULARIZATION N/A 06/06/2021   Procedure: Coronary/Graft Acute MI Revascularization;  Surgeon: Wellington Hampshire, MD;  Location: [place] [facility];  Service: Cardiovascular;  Laterality: N/A;   ELECTROMAGNETIC NAVIGATION BROCHOSCOPY N/A 11/27/2016   Procedure: ELECTROMAGNETIC NAVIGATION BRONCHOSCOPY;  Surgeon: [name], MD;  Location: [facility] ORS;  Service: Cardiopulmonary;  Laterality: N/A;   LEFT HEART CATH AND CORONARY ANGIOGRAPHY N/A 06/06/2021   Procedure: LEFT HEART CATH AND CORONARY ANGIOGRAPHY;  Surgeon: Wellington Hampshire, MD;  Location: [place] [facility];  Service: Cardiovascular;  Laterality:  N/A;     reports that he quit smoking about 9 years ago. His smoking use included cigarettes. He smoked an average of 1 pack per day. He has never used smokeless tobacco. He reports that he does not drink alcohol and does not use drugs.  Allergies  Allergen Reactions   Statins Other (See Comments)    Arthralagia    Family History  Problem Relation Age of Onset   Heart attack Mother    Heart attack Father      Prior to Admission medications   Medication Sig Start Date End Date Taking?  Authorizing Provider  aspirin 81 MG chewable tablet Chew 1 tablet (81 mg total) by mouth daily. 06/08/21  Yes Sharen Hones, MD  losartan-hydrochlorothiazide (HYZAAR) 50-12.5 MG tablet Take 1 tablet by mouth daily. 10/10/20  Yes [provider]  metoprolol succinate (TOPROL-XL) 50 MG 24 hr tablet Take 50 mg by mouth daily. 05/30/19  Yes [provider]  ticagrelor (BRILINTA) 90 MG TABS tablet Take 1 tablet (90 mg total) by mouth 2 (two) times daily. 06/08/21  Yes Sharen Hones, MD  albuterol (VENTOLIN HFA) 108 (90 Base) MCG/ACT inhaler Inhale into the lungs every 6 (six) hours as needed. 01/24/21   [provider]    Physical Exam: Vitals:   06/08/21 2039 06/08/21 2040 06/08/21 2130 06/08/21 2200  BP: 105/69  (!) 88/59 98/68  Pulse: (!) 159  92 88  Resp: (!) 24  (!) 21 (!) 22  Temp: 98.8 F (37.1 C)     TempSrc: Oral     SpO2: 100%  97% 98%  Weight:  62.6 kg    Height:  5\' 11"  (1.803 m)        Constitutional: Acutely ill looking, anxious no distress Vitals:   06/08/21 2039 06/08/21 2040 06/08/21 2130 06/08/21 2200  BP: 105/69  (!) 88/59 98/68  Pulse: (!) 159  92 88  Resp: (!) 24  (!) 21 (!) 22  Temp: 98.8 F (37.1 C)     TempSrc: Oral     SpO2: 100%  97% 98%  Weight:  62.6 kg    Height:  5\' 11"  (1.803 m)     Eyes: PERRL, lids and conjunctivae normal ENMT: Mucous membranes are moist. Posterior pharynx clear of any exudate or lesions.Normal dentition.  Neck: normal, supple, no masses, no thyromegaly Respiratory: clear to auscultation bilaterally, no wheezing, no crackles. Normal respiratory effort. No accessory muscle use.  Cardiovascular: Irregularly irregular with tachycardia, no murmurs / rubs / gallops. No extremity edema. 2+ pedal pulses. No carotid bruits.  Abdomen: no tenderness, no masses palpated. No hepatosplenomegaly. Bowel sounds positive.  Musculoskeletal: no clubbing / cyanosis. No joint deformity upper and lower extremities. Good ROM, no  contractures. Normal muscle tone.  Skin: no rashes, lesions, ulcers. No induration Neurologic: CN 2-12 grossly intact. Sensation intact, DTR normal. Strength 5/5 in all 4.  Psychiatric: Normal judgment and insight. Alert and oriented x 3. Normal mood.     Labs on Admission: I have personally reviewed following labs and imaging studies  CBC: Recent Labs  Lab 06/06/21 0033 06/06/21 0534 06/07/21 0504 06/08/21 0607 06/08/21 2110  WBC 17.1* 13.5* 12.7* 12.0* 11.9*  NEUTROABS 14.1*  --   --   --  9.2*  HGB 16.8 15.4 14.9 14.1 14.9  HCT 47.3 43.1 41.8 40.7 41.3  MCV 87.4 88.3 88.7 87.7 87.7  PLT 217 201 196 180 496   Basic Metabolic Panel: Recent Labs  Lab 06/06/21 0033  06/06/21 0534 06/07/21 0504 06/08/21 0607 06/08/21 2110  NA 132* 134* 135 133* 132*  K 3.3* 3.5 3.7 3.3* 3.7  CL 92* 99 99 100 96*  CO2 29 24 29 26 27   GLUCOSE 212* 143* 147* 125* 149*  BUN 13 10 9 12 17   CREATININE 0.80 0.71 0.83 0.73 1.01  CALCIUM 8.9 8.5* 8.8* 8.3* 8.7*  MG  --   --  2.1 2.1 2.1   GFR: Estimated Creatinine Clearance: 47.3 mL/min (by C-G formula based on SCr of 1.01 mg/dL). Liver Function Tests: Recent Labs  Lab 06/06/21 0033  AST 21  ALT 12  ALKPHOS 99  BILITOT 1.1  PROT 7.6  ALBUMIN 4.4   No results for input(s): LIPASE, AMYLASE in the last 168 hours. No results for input(s): AMMONIA in the last 168 hours. Coagulation Profile: Recent Labs  Lab 06/06/21 0033  INR 1.0   Cardiac Enzymes: No results for input(s): CKTOTAL, CKMB, CKMBINDEX, TROPONINI in the last 168 hours. BNP (last 3 results) No results for input(s): PROBNP in the last 8760 hours. HbA1C: Recent Labs    06/06/21 0523  HGBA1C 6.2*   CBG: Recent Labs  Lab 06/07/21 1126 06/07/21 1619 06/07/21 2237 06/08/21 0719 06/08/21 1131  GLUCAP 159* 86 144* 135* 79   Lipid Profile: Recent Labs    06/06/21 0033  CHOL 176  HDL 35*  LDLCALC 101*  TRIG 202*  CHOLHDL 5.0   Thyroid Function Tests: No  results for input(s): TSH, T4TOTAL, FREET4, T3FREE, THYROIDAB in the last 72 hours. Anemia Panel: No results for input(s): VITAMINB12, FOLATE, FERRITIN, TIBC, IRON, RETICCTPCT in the last 72 hours. Urine analysis:    Component Value Date/Time   COLORURINE YELLOW (A) 10/04/2020 1239   APPEARANCEUR CLEAR (A) 10/04/2020 1239   APPEARANCEUR Clear 11/28/2013 0605   LABSPEC 1.003 (L) 10/04/2020 1239   LABSPEC 1.010 11/28/2013 0605   PHURINE 7.0 10/04/2020 1239   GLUCOSEU NEGATIVE 10/04/2020 1239   GLUCOSEU 50 mg/dL 11/28/2013 0605   HGBUR NEGATIVE 10/04/2020 1239   BILIRUBINUR NEGATIVE 10/04/2020 1239   BILIRUBINUR Negative 11/28/2013 0605   KETONESUR NEGATIVE 10/04/2020 1239   PROTEINUR NEGATIVE 10/04/2020 1239   NITRITE NEGATIVE 10/04/2020 1239   LEUKOCYTESUR NEGATIVE 10/04/2020 1239   LEUKOCYTESUR Negative 11/28/2013 0605   Sepsis Labs: @LABRCNTIP (procalcitonin:4,lacticidven:4) ) Recent Results (from the past 240 hour(s))  Resp Panel by RT-PCR (Flu A&B, Covid) Nasopharyngeal Swab     Status: None   Collection Time: 06/06/21 12:33 AM   Specimen: Nasopharyngeal Swab; Nasopharyngeal(NP) swabs in vial transport medium  Result Value Ref Range Status   SARS Coronavirus 2 by RT PCR NEGATIVE NEGATIVE Final    Comment: (NOTE) SARS-CoV-2 target nucleic acids are NOT DETECTED.  The SARS-CoV-2 RNA is generally detectable in upper respiratory specimens during the acute phase of infection. The lowest concentration of SARS-CoV-2 viral copies this assay can detect is 138 copies/mL. A negative result does not preclude SARS-Cov-2 infection and should not be used as the sole basis for treatment or other patient management decisions. A negative result may occur with  improper specimen collection/handling, submission of specimen other than nasopharyngeal swab, presence of viral mutation(s) within the areas targeted by this assay, and inadequate number of viral copies(<138 copies/mL). A negative  result must be combined with clinical observations, patient history, and epidemiological information. The expected result is Negative.  Fact Sheet for Patients:  EntrepreneurPulse.com.au  Fact Sheet for Healthcare Providers:  IncredibleEmployment.be  This test is no t yet approved or  cleared by the [place] and  has been authorized for detection and/or diagnosis of SARS-CoV-2 by FDA under an Emergency Use Authorization (EUA). This EUA will remain  in effect (meaning this test can be used) for the duration of the COVID-19 declaration under Section 564(b)(1) of the Act, 21 U.S.C.section 360bbb-3(b)(1), unless the authorization is terminated  or revoked sooner.       Influenza A by PCR NEGATIVE NEGATIVE Final   Influenza B by PCR NEGATIVE NEGATIVE Final    Comment: (NOTE) The Xpert Xpress SARS-CoV-2/FLU/RSV plus assay is intended as an aid in the diagnosis of influenza from Nasopharyngeal swab specimens and should not be used as a sole basis for treatment. Nasal washings and aspirates are unacceptable for Xpert Xpress SARS-CoV-2/FLU/RSV testing.  Fact Sheet for Patients: EntrepreneurPulse.com.au  Fact Sheet for Healthcare Providers: IncredibleEmployment.be  This test is not yet approved or cleared by the [place] FDA and has been authorized for detection and/or diagnosis of SARS-CoV-2 by FDA under an Emergency Use Authorization (EUA). This EUA will remain in effect (meaning this test can be used) for the duration of the COVID-19 declaration under Section 564(b)(1) of the Act, 21 U.S.C. section 360bbb-3(b)(1), unless the authorization is terminated or revoked.  Performed at [facility], [address] [place], [place] [address]   MRSA Next Gen by PCR, Nasal     Status: None   Collection Time: 06/06/21  2:15 AM   Specimen: Nasal Mucosa; Nasal Swab  Result Value Ref Range Status    MRSA by PCR Next Gen NOT DETECTED NOT DETECTED Final    Comment: (NOTE) The GeneXpert MRSA Assay (FDA approved for NASAL specimens only), is one component of a comprehensive MRSA colonization surveillance program. It is not intended to diagnose MRSA infection nor to guide or monitor treatment for MRSA infections. Test performance is not FDA approved in patients less than 82 years old. Performed at [facility], [address] [place],  [address]   Resp Panel by RT-PCR (Flu A&B, Covid) Nasopharyngeal Swab     Status: None   Collection Time: 06/08/21  9:10 PM   Specimen: Nasopharyngeal Swab; Nasopharyngeal(NP) swabs in vial transport medium  Result Value Ref Range Status   SARS Coronavirus 2 by RT PCR NEGATIVE NEGATIVE Final    Comment: (NOTE) SARS-CoV-2 target nucleic acids are NOT DETECTED.  The SARS-CoV-2 RNA is generally detectable in upper respiratory specimens during the acute phase of infection. The lowest concentration of SARS-CoV-2 viral copies this assay can detect is 138 copies/mL. A negative result does not preclude SARS-Cov-2 infection and should not be used as the sole basis for treatment or other patient management decisions. A negative result may occur with  improper specimen collection/handling, submission of specimen other than nasopharyngeal swab, presence of viral mutation(s) within the areas targeted by this assay, and inadequate number of viral copies(<138 copies/mL). A negative result must be combined with clinical observations, patient history, and epidemiological information. The expected result is Negative.  Fact Sheet for Patients:  EntrepreneurPulse.com.au  Fact Sheet for Healthcare Providers:  IncredibleEmployment.be  This test is no t yet approved or cleared by the [place] FDA and  has been authorized for detection and/or diagnosis of SARS-CoV-2 by FDA under an Emergency Use Authorization  (EUA). This EUA will remain  in effect (meaning this test can be used) for the duration of the COVID-19 declaration under Section 564(b)(1) of the Act, 21 U.S.C.section 360bbb-3(b)(1), unless the authorization is terminated  or revoked sooner.       Influenza A by PCR NEGATIVE NEGATIVE Final   Influenza B by PCR NEGATIVE NEGATIVE Final    Comment: (NOTE) The Xpert Xpress SARS-CoV-2/FLU/RSV plus assay is intended as an aid in the diagnosis of influenza from Nasopharyngeal swab specimens and should not be used as a sole basis for treatment. Nasal washings and aspirates are unacceptable for Xpert Xpress SARS-CoV-2/FLU/RSV testing.  Fact Sheet for Patients: EntrepreneurPulse.com.au  Fact Sheet for Healthcare Providers: IncredibleEmployment.be  This test is not yet approved or cleared by the [place] FDA and has been authorized for detection and/or diagnosis of SARS-CoV-2 by FDA under an Emergency Use Authorization (EUA). This EUA will remain in effect (meaning this test can be used) for the duration of the COVID-19 declaration under Section 564(b)(1) of the Act, 21 U.S.C. section 360bbb-3(b)(1), unless the authorization is terminated or revoked.  Performed at [facility], [address] [place], [place] [address]      Radiological Exams on Admission: DG Chest Portable 1 View  Result Date: 06/08/2021 CLINICAL DATA:  Short of breath, arrhythmia, non-small cell lung cancer EXAM: PORTABLE CHEST 1 VIEW COMPARISON:  02/28/2021, 06/06/2021 FINDINGS: Single frontal view of the chest demonstrates a stable cardiac silhouette. Left suprahilar mass and left upper lobe consolidation again noted, with fiduciary markers unchanged in position. Stable atherosclerosis of the thoracic aorta. No new airspace disease, effusion, or pneumothorax. Prominent skin folds overlie the left apex. There are no acute bony abnormalities. IMPRESSION: 1. No acute  airspace disease. 2. Stable left upper consolidation, compatible with post therapeutic changes based on recent PET CT. Electronically Signed   By: Randa Ngo M.D.   On: 06/08/2021 21:27    EKG: Independently reviewed.  It showed A. fib with RVR rate of over 140s with diffuse ST changes.  Assessment/Plan Principal Problem:   Atrial fibrillation with rapid ventricular response (HCC) Active Problems:   CAD (coronary artery disease)   Hyperlipidemia   Hypertension   Diabetes mellitus type 2, uncomplicated (HCC)   Acute ST elevation myocardial infarction (STEMI) of lateral wall (HCC)   Chronic anticoagulation     #1 atrial fibrillation with rapid ventricular response: Patient currently on Cardizem drip.  Rate is already improving.  He additionally has Brilinta already started.  We will add back his Eliquis.   Also continue with metoprolol.  Monitor patient's response to these and titrate off Cardizem as soon as practicable.  #2 recent ST elevation MI: Patient still having significantly elevated troponins.  Most likely from recent MI.  Will defer decision to cardiology.  Continue to cycle enzymes.  #3 essential hypertension: Patient has been on losartan hydrochlorothiazide as well as metoprolol.  Continue with metoprolol and hold others due to hypotension.  #4 diabetes: Not on any home medicine.  Initiate sliding scale insulin and monitor.  #5 hyperlipidemia: Did not see statin at this point.  He is significantly allergic to statins.  #6 protein-calorie malnutrition: Dietary counseling   DVT prophylaxis: Eliquis Code Status: Full code Family Communication: Wife at bedside Disposition Plan: Home Consults called: Dr. Clayborn Bigness, cardiology Admission status: Inpatient  Severity of Illness: The appropriate patient status for this patient is INPATIENT. Inpatient status is judged to be reasonable and necessary in order to provide the required intensity of service to ensure the patient's  safety. The patient's presenting symptoms, physical exam findings, and initial radiographic and laboratory data in the context of their chronic comorbidities is felt to place them at  high risk for further clinical deterioration. Furthermore, it is not anticipated that the patient will be medically stable for discharge from the hospital within 2 midnights of admission. The following factors support the patient status of inpatient.   " The patient's presenting symptoms include tachycardia. " The worrisome physical exam findings include tachycardia. " The initial radiographic and laboratory data are worrisome because of irregular heart rhythm. " The chronic co-morbidities include coronary artery disease.   * I certify that at the point of admission it is my clinical judgment that the patient will require inpatient hospital care spanning beyond 2 midnights from the point of admission due to high intensity of service, high risk for further deterioration and high frequency of surveillance required.Barbette Merino MD [facility] Pager [phone]  If 7PM-7AM, please contact night-coverage [email or web address] Password [facility]  06/08/2021, 10:57 PM

## 2021-06-08 NOTE — ED Notes (Signed)
Date and time results received: 06/08/21 2157 (use smartphrase ".now" to insert current time)  Test: troponin Critical Value: 12,547  Name of Provider Notified: DR. Charna Archer  Orders Received? Or Actions Taken?: no/na

## 2021-06-08 NOTE — Progress Notes (Signed)
[facility] Card[facility]    SUBJECTIVE: Patient feels much better no chest pain no shortness of breath no leg edema denies any bleeding no symptoms ready to go home   Vitals:   06/08/21 0800 06/08/21 0900 06/08/21 1000 06/08/21 1100  BP:    126/74  Pulse: 88 87 87 89  Resp: 20 (!) 24 16 (!) 21  Temp:      TempSrc:      SpO2: 99% 96% 97% 98%  Weight:      Height:         Intake/Output Summary (Last 24 hours) at 06/08/2021 1227 Last data filed at 06/08/2021 0800 Gross per 24 hour  Intake 360 ml  Output 950 ml  Net -590 ml      PHYSICAL EXAM  General: Well developed, well nourished, in no acute distress HEENT:  Normocephalic and atramatic Neck:  No JVD.  Lungs: Clear bilaterally to auscultation and percussion. Heart: HRRR . Normal S1 and S2 without gallops or murmurs.  Abdomen: Bowel sounds are positive, abdomen soft and non-tender  Msk:  Back normal, normal gait. Normal strength and tone for age. Extremities: No clubbing, cyanosis or edema.   Neuro: Alert and oriented X 3. Psych:  Good affect, responds appropriately   LABS: Basic Metabolic Panel: Recent Labs    06/07/21 0504 06/08/21 0607  NA 135 133*  K 3.7 3.3*  CL 99 100  CO2 29 26  GLUCOSE 147* 125*  BUN 9 12  CREATININE 0.83 0.73  CALCIUM 8.8* 8.3*  MG 2.1 2.1   Liver Function Tests: Recent Labs    06/06/21 0033  AST 21  ALT 12  ALKPHOS 99  BILITOT 1.1  PROT 7.6  ALBUMIN 4.4   No results for input(s): LIPASE, AMYLASE in the last 72 hours. CBC: Recent Labs    06/06/21 0033 06/06/21 0534 06/07/21 0504 06/08/21 0607  WBC 17.1*   < > 12.7* 12.0*  NEUTROABS 14.1*  --   --   --   HGB 16.8   < > 14.9 14.1  HCT 47.3   < > 41.8 40.7  MCV 87.4   < > 88.7 87.7  PLT 217   < > 196 180   < > = values in this interval not displayed.   Cardiac Enzymes: No results for input(s): CKTOTAL, CKMB, CKMBINDEX, TROPONINI in the last 72 hours. BNP: Invalid input(s): POCBNP D-Dimer: No results for input(s):  DDIMER in the last 72 hours. Hemoglobin A1C: Recent Labs    06/06/21 0523  HGBA1C 6.2*   Fasting Lipid Panel: Recent Labs    06/06/21 0033  CHOL 176  HDL 35*  LDLCALC 101*  TRIG 202*  CHOLHDL 5.0   Thyroid Function Tests: No results for input(s): TSH, T4TOTAL, T3FREE, THYROIDAB in the last 72 hours.  Invalid input(s): FREET3 Anemia Panel: No results for input(s): VITAMINB12, FOLATE, FERRITIN, TIBC, IRON, RETICCTPCT in the last 72 hours.  No results found.   Echo borderline echocardiogram EF of around 45-50%  TELEMETRY: Normal sinus rhythm rate of 90:  ASSESSMENT AND PLAN:  Principal Problem:   Acute ST elevation myocardial infarction (STEMI) of lateral wall (HCC) Active Problems:   Diabetes mellitus type 2, uncomplicated (HCC)   PAF (paroxysmal atrial fibrillation) (HCC)   Chronic anticoagulation   Protein-calorie malnutrition, severe    Plan Status post STEMI PCI and stent to circumflex with DES Hold Eliquis until seen as an outpatient Continue Brilinta aspirin for now we will discontinue aspirin after 30 days  Continue diabetes management and control Consider increasing metoprolol therapy because of mild tachycardia Consider discontinuing amlodipine Continue inhalers for COPD asthma Currently on losartan HCTZ for hypertension Patient stable enough for discharge home Follow-up cardiology as an outpatient Will probably refer to cardiac rehab   Yolonda Kida, MD 06/08/2021 12:27 PM

## 2021-06-08 NOTE — Discharge Summary (Signed)
Physician Discharge Summary  Patient ID: Scott Gross MRN: [id number] DOB/AGE: 01/17/1934 87 y.o.  Admit date: 06/06/2021 Discharge date: 06/08/2021  Admission Diagnoses:  Discharge Diagnoses:  Principal Problem:   Acute ST elevation myocardial infarction (STEMI) of lateral wall (HCC) Active Problems:   Diabetes mellitus type 2, uncomplicated (HCC)   PAF (paroxysmal atrial fibrillation) (HCC)   Chronic anticoagulation   Protein-calorie malnutrition, severe Hypokalemia  Discharged Condition: good  Hospital Course:  Scott Gross is a 87 y.o. male with medical history significant for DM, HTN, paroxysmal A. fib on Eliquis, CAD with prior stent to LAD, V. fib cardiac arrest, admitted s/p cardiac cath for lateral STEMI, after presenting to the ED with a 2-hour history of left-sided chest pain radiating to the shoulder.  He underwent stent placement to the left circumflex which was completely occluded and was noted to have disease in the LAD and RCA to be treated medically per Dr. Fletcher Anon.  Patient was transferred from the Cath Lab to stepdown in stable condition.  #1.  STEMI. Paroxysmal atrial fibrillation. Acute systolic congestive heart failure secondary to STEMI. Patient is status post heart cath, decided to treat medically. Echocardiogram showed ejection fraction 45 to 50%.  Currently patient does not have any volume overload.  We will continue treatment with aspirin and Brilinta.  Also on Crestor. Beta-blocker also added for systolic dysfunction. Discussed with Dr. Clayborn Bigness, will continue aspirin and Brilinta, he will follow-up with the patient in 1 week to decide on starting Eliquis at that time.   #2.  Type 2 diabetes. Continue current regimen.   Consults: cardiology  Significant Diagnostic Studies:  Echo: 1. Lateral Inferior Hypo. Left ventricular ejection fraction, by estimation, is 45 to 50%. The left ventricle has mildly decreased function. The left ventricle  demonstrates regional wall motion abnormalities (see scoring diagram/findings for description). Left ventricular diastolic parameters were normal. 2. 3. Right ventricular systolic function is normal. The right ventricular size is normal. 4. The mitral valve is normal in structure. No evidence of mitral valve regurgitation. The aortic valve is normal in structure. Aortic valve regurgitation is not visualized. Mild aortic valve sclerosis is present, with no evidence of aortic valve stenosis.  Heart cath:    Prox RCA lesion is 60% stenosed.   Prox RCA to Mid RCA lesion is 50% stenosed.   Dist RCA lesion is 30% stenosed.   Mid LAD-1 lesion is 10% stenosed.   Mid LAD-2 lesion is 60% stenosed.   Prox LAD to Mid LAD lesion is 70% stenosed.   Prox Cx to Mid Cx lesion is 100% stenosed.   A drug-eluting stent was successfully placed using a STENT ONYX FRONTIER [id number].   Post intervention, there is a 0% residual stenosis.   There is mild left ventricular systolic dysfunction.   LV end diastolic pressure is moderately elevated.   The left ventricular ejection fraction is 45-50% by visual estimate.   1.  Significant underlying three-vessel coronary artery disease.  The culprit for lateral STEMI is an occluded proximal left circumflex.  Patent stent in the mid LAD with 70% stenosis in the proximal LAD and 60% disease in the mid LAD distal to the previously placed stent.  In addition, there is borderline disease in the right coronary artery. 2.  Mildly reduced LV systolic function with apical lateral hypokinesis.  Moderately elevated left ventricular end-diastolic pressure. 3.  Successful angioplasty and drug-eluting stent placement to the left circumflex.   Recommendations: Treat with dual antiplatelet therapy with  aspirin and ticagrelor for now.  Eliquis can be resumed in 1 to 2 days for paroxysmal atrial fibrillation.  Once Eliquis is resumed, recommend stopping aspirin to minimize the risk of  bleeding on triple therapy. The rest of his coronary artery disease can likely be treated medically but if he has refractory anginal symptoms, PCI of the proximal LAD can be considered.   Treatments: ASA, Brilinta  Discharge Exam: Blood pressure 104/67, pulse 88, temperature 98.8 F (37.1 C), temperature source Oral, resp. rate 20, height 5\' 9"  (1.753 m), weight 58.8 kg, SpO2 99 %. General appearance: alert and cooperative Resp: clear to auscultation bilaterally Cardio: regular rate and rhythm, S1, S2 normal, no murmur, click, rub or gallop GI: soft, non-tender; bowel sounds normal; no masses,  no organomegaly Extremities: extremities normal, atraumatic, no cyanosis or edema  Disposition: Discharge disposition: 01-Home or Self Care       Discharge Instructions     AMB Referral to Cardiac Rehabilitation - Phase II   Complete by: As directed    Diagnosis:  STEMI Coronary Stents     After initial evaluation and assessments completed: Virtual Based Care may be provided alone or in conjunction with Phase 2 Cardiac Rehab based on patient barriers.: Yes   Diet - low sodium heart healthy   Complete by: As directed    Increase activity slowly   Complete by: As directed       Allergies as of 06/08/2021       Reactions   Statins Other (See Comments)   Arthralagia        Medication List     STOP taking these medications    amLODipine 5 MG tablet Commonly known as: NORVASC   benzonatate 200 MG capsule Commonly known as: TESSALON   chlorpheniramine-HYDROcodone 10-8 MG/5ML Suer Commonly known as: TUSSIONEX   Eliquis 5 MG Tabs tablet Generic drug: apixaban   lisinopril-hydrochlorothiazide 20-12.5 MG tablet Commonly known as: ZESTORETIC       TAKE these medications    albuterol 108 (90 Base) MCG/ACT inhaler Commonly known as: VENTOLIN HFA Inhale into the lungs every 6 (six) hours as needed.   aspirin 81 MG chewable tablet Chew 1 tablet (81 mg total) by mouth  daily.   losartan-hydrochlorothiazide 50-12.5 MG tablet Commonly known as: HYZAAR Take 1 tablet by mouth daily.   metoprolol succinate 50 MG 24 hr tablet Commonly known as: TOPROL-XL Take 50 mg by mouth daily.   ticagrelor 90 MG Tabs tablet Commonly known as: BRILINTA Take 1 tablet (90 mg total) by mouth 2 (two) times daily.        Follow-up Information     [name], MD Follow up in 1 week(s).   Specialty: Family Medicine Contact information: [place] [address] [phone]         Yolonda Kida, MD Follow up in 1 week(s).   Specialties: Cardiology, Internal Medicine Contact information: [place] [address] [phone]                 Signed: Sharen Hones 06/08/2021, 10:19 AM

## 2021-06-09 DIAGNOSIS — I4891 Unspecified atrial fibrillation: Secondary | ICD-10-CM | POA: Diagnosis present

## 2021-06-09 DIAGNOSIS — E119 Type 2 diabetes mellitus without complications: Secondary | ICD-10-CM

## 2021-06-09 DIAGNOSIS — I2510 Atherosclerotic heart disease of native coronary artery without angina pectoris: Secondary | ICD-10-CM | POA: Diagnosis not present

## 2021-06-09 DIAGNOSIS — Z7901 Long term (current) use of anticoagulants: Secondary | ICD-10-CM | POA: Diagnosis not present

## 2021-06-09 LAB — BASIC METABOLIC PANEL
Anion gap: 6 (ref 5–15)
BUN: 13 mg/dL (ref 8–23)
CO2: 25 mmol/L (ref 22–32)
Calcium: 7.3 mg/dL — ABNORMAL LOW (ref 8.9–10.3)
Chloride: 100 mmol/L (ref 98–111)
Creatinine, Ser: 0.75 mg/dL (ref 0.61–1.24)
GFR, Estimated: >60 mL/min (ref >60)
Glucose, Bld: 149 mg/dL — ABNORMAL HIGH (ref 70–99)
Potassium: 2.9 mmol/L — ABNORMAL LOW (ref 3.5–5.1)
Sodium: 131 mmol/L — ABNORMAL LOW (ref 135–145)

## 2021-06-09 LAB — CBC
HCT: 36.9 % — ABNORMAL LOW (ref 39.0–52.0)
Hemoglobin: 13 g/dL (ref 13.0–17.0)
MCH: 31.8 pg (ref 26.0–34.0)
MCHC: 35.2 g/dL (ref 30.0–36.0)
MCV: 90.2 fL (ref 80.0–100.0)
Platelets: 161 10*3/uL (ref 150–400)
RBC: 4.09 MIL/uL — ABNORMAL LOW (ref 4.22–5.81)
RDW: 13.2 % (ref 11.5–15.5)
WBC: 10.4 10*3/uL (ref 4.0–10.5)
nRBC: 0 % (ref 0.0–0.2)

## 2021-06-09 MED ORDER — AMIODARONE HCL IN DEXTROSE 360-4.14 MG/200ML-% IV SOLN: 30.0000 mg/h | INTRAVENOUS | Status: DC

## 2021-06-09 MED ORDER — METOPROLOL SUCCINATE ER 50 MG PO TB24
50.0000 mg | ORAL_TABLET | Freq: Every day | ORAL | Status: DC
  Administered 2021-06-09: 50 mg via ORAL
  Filled 2021-06-09: qty 1

## 2021-06-09 MED ORDER — ONDANSETRON HCL 4 MG/2ML IJ SOLN: 4.0000 mg | Freq: Four times a day (QID) | INTRAMUSCULAR | Status: DC | PRN

## 2021-06-09 MED ORDER — TICAGRELOR 90 MG PO TABS: 90.0000 mg | ORAL_TABLET | Freq: Two times a day (BID) | ORAL | Status: DC

## 2021-06-09 MED ORDER — ASPIRIN 81 MG PO CHEW
81.0000 mg | CHEWABLE_TABLET | Freq: Every day | ORAL | Status: DC
  Administered 2021-06-09: 81 mg via ORAL
  Filled 2021-06-09: qty 1

## 2021-06-09 MED ORDER — ALBUTEROL SULFATE (2.5 MG/3ML) 0.083% IN NEBU: 3.0000 mL | INHALATION_SOLUTION | Freq: Four times a day (QID) | RESPIRATORY_TRACT | Status: DC | PRN

## 2021-06-09 MED ORDER — POTASSIUM CHLORIDE CRYS ER 20 MEQ PO TBCR
40.0000 meq | EXTENDED_RELEASE_TABLET | Freq: Once | ORAL | Status: AC
  Administered 2021-06-09: 40 meq via ORAL
  Filled 2021-06-09: qty 2

## 2021-06-09 MED ORDER — APIXABAN 5 MG PO TABS
5.0000 mg | ORAL_TABLET | Freq: Two times a day (BID) | ORAL | Status: DC
  Administered 2021-06-09: 5 mg via ORAL
  Filled 2021-06-09: qty 1

## 2021-06-09 MED ORDER — AMIODARONE HCL 200 MG PO TABS: 200.0000 mg | ORAL_TABLET | Freq: Every day | ORAL | 0 refills | Status: DC

## 2021-06-09 MED ORDER — AMIODARONE HCL IN DEXTROSE 360-4.14 MG/200ML-% IV SOLN
60.0000 mg/h | INTRAVENOUS | Status: AC
  Administered 2021-06-09: 60 mg/h via INTRAVENOUS
  Filled 2021-06-09 (×2): qty 200

## 2021-06-09 MED ORDER — AMIODARONE HCL 200 MG PO TABS
200.0000 mg | ORAL_TABLET | Freq: Once | ORAL | Status: AC
  Administered 2021-06-09: 200 mg via ORAL
  Filled 2021-06-09: qty 1

## 2021-06-09 MED ORDER — ACETAMINOPHEN 325 MG PO TABS: 650.0000 mg | ORAL_TABLET | ORAL | Status: DC | PRN

## 2021-06-09 MED ORDER — AMIODARONE HCL 200 MG PO TABS: 200.0000 mg | ORAL_TABLET | Freq: Two times a day (BID) | ORAL | 0 refills | Status: AC

## 2021-06-09 MED ORDER — APIXABAN 5 MG PO TABS: 5.0000 mg | ORAL_TABLET | Freq: Two times a day (BID) | ORAL | 1 refills | Status: DC

## 2021-06-09 MED ORDER — AMIODARONE LOAD VIA INFUSION
150.0000 mg | Freq: Once | INTRAVENOUS | Status: AC
  Administered 2021-06-09: 150 mg via INTRAVENOUS
  Filled 2021-06-09: qty 83.34

## 2021-06-09 NOTE — ED Notes (Signed)
Patient ambulated around ER, HR remains in 80s NSR. MD aware. Patient to be discharged.

## 2021-06-09 NOTE — Consult Note (Signed)
CARDIOLOGY CONSULT NOTE               Patient ID: Scott Gross MRN: [id number] DOB/AGE: 16-Mar-1934 87 y.o.  Admit date: 06/08/2021 Referring Physician Dr. Judd Gaudier Primary Physician Dr. Bertrum Sol primary Primary Cardiologist [facility] Reason for Consultation AFIB p  HPI: Patient is a 87 year old male history of multiple medical problems recent STEMI with DES placement to circumflex recently patient had previous PCI and stent LAD in the past patient has history of lung cancer status post radiation hyperlipidemia hypertension diabetes previous atrial fibrillation had been in sinus rhythm but then developed rapid atrial fibrillation at home right after discharge he started noticing his heart rate being fast and blood pressure below and then finally brought him to the emergency room by rescue denies much in the way of chest pain feels relatively asymptomatic with heart rates of close to 150  Review of systems complete and found to be negative unless listed above     Past Medical History:  Diagnosis Date   Arthritis    CAD (coronary artery disease) Nov. 12, 2012   Inferior ST elevation MI in November of 2012 with ventricular fibrillation arrest. Status post drug-eluting stent placement to the mid LAD. Most recent cardiac catheterization in June of this year showed patent stent with minimal restenosis, 75% proximal LAD and 80% distal LAD which were unchanged from before. Mild disease in the left circumflex and moderate RCA disease. Ejection fraction was 55%.   Cancer of upper lobe of left lung ([place]) 11/2016   Rad tx's.   Cardiac arrest [facility]) Nov. 2012   x 2    Cardiac arrest - ventricular fibrillation    HOH (hard of hearing)    Left Hearing Aid   Hyperlipidemia    Hypertension    Post PTCA 2013   x2   ST elevation MI (STEMI) ([place]) 08/26/2011    Past Surgical History:  Procedure Laterality Date   CARDIAC CATHETERIZATION  2013   @ [place]; [facility] s/p stent    CORONARY  ANGIOPLASTY     CORONARY/GRAFT ACUTE MI REVASCULARIZATION N/A 06/06/2021   Procedure: Coronary/Graft Acute MI Revascularization;  Surgeon: Wellington Hampshire, MD;  Location: [place] [facility];  Service: Cardiovascular;  Laterality: N/A;   ELECTROMAGNETIC NAVIGATION BROCHOSCOPY N/A 11/27/2016   Procedure: ELECTROMAGNETIC NAVIGATION BRONCHOSCOPY;  Surgeon: [name], MD;  Location: [facility] ORS;  Service: Cardiopulmonary;  Laterality: N/A;   LEFT HEART CATH AND CORONARY ANGIOGRAPHY N/A 06/06/2021   Procedure: LEFT HEART CATH AND CORONARY ANGIOGRAPHY;  Surgeon: Wellington Hampshire, MD;  Location: [place] CV LAB;  Service: Cardiovascular;  Laterality: N/A;    (Not in a hospital admission)  Social History   Socioeconomic History   Marital status: Married    Spouse name: Not on file   Number of children: Not on file   Years of education: Not on file   Highest education level: Not on file  Occupational History   Not on file  Tobacco Use   Smoking status: Former    Packs/day: 1.00    Types: Cigarettes    Quit date: 08/27/2011    Years since quitting: 9.7   Smokeless tobacco: Never  Vaping Use   Vaping Use: Never used  Substance and Sexual Activity   Alcohol use: No   Drug use: No   Sexual activity: Not Currently  Other Topics Concern   Not on file  Social History Narrative   Not on file   Social Determinants  of Health   Financial Resource Strain: Not on file  Food Insecurity: Not on file  Transportation Needs: Not on file  Physical Activity: Not on file  Stress: Not on file  Social Connections: Not on file  Intimate Partner Violence: Not on file    Family History  Problem Relation Age of Onset   Heart attack Mother    Heart attack Father       Review of systems complete and found to be negative unless listed above      PHYSICAL EXAM  General: Well developed, well nourished, in no acute distress HEENT:  Normocephalic and atramatic Neck:  No JVD.  Lungs: Clear  bilaterally to auscultation and percussion. Heart: HRRR . Normal S1 and S2 without gallops or murmurs.  Abdomen: Bowel sounds are positive, abdomen soft and non-tender  Msk:  Back normal, normal gait. Normal strength and tone for age. Extremities: No clubbing, cyanosis or edema.   Neuro: Alert and oriented X 3. Psych:  Good affect, responds appropriately  Labs:   Lab Results  Component Value Date   WBC 10.4 06/09/2021   HGB 13.0 06/09/2021   HCT 36.9 (L) 06/09/2021   MCV 90.2 06/09/2021   PLT 161 06/09/2021    Recent Labs  Lab 06/06/21 0033 06/06/21 0534 06/09/21 0820  NA 132*   < > 131*  K 3.3*   < > 2.9*  CL 92*   < > 100  CO2 29   < > 25  BUN 13   < > 13  CREATININE 0.80   < > 0.75  CALCIUM 8.9   < > 7.3*  PROT 7.6  --   --   BILITOT 1.1  --   --   ALKPHOS 99  --   --   ALT 12  --   --   AST 21  --   --   GLUCOSE 212*   < > 149*   < > = values in this interval not displayed.   Lab Results  Component Value Date   CKTOTAL 55 11/28/2013   CKMB 0.7 11/28/2013   TROPONINI <0.03 02/10/2018    Lab Results  Component Value Date   CHOL 176 06/06/2021   CHOL 110 05/21/2019   CHOL 92 11/28/2013   Lab Results  Component Value Date   HDL 35 (L) 06/06/2021   HDL 31 (L) 05/21/2019   HDL 27 (L) 11/28/2013   Lab Results  Component Value Date   LDLCALC 101 (H) 06/06/2021   LDLCALC 66 05/21/2019   LDLCALC 41 11/28/2013   Lab Results  Component Value Date   TRIG 202 (H) 06/06/2021   TRIG 63 05/21/2019   TRIG 121 11/28/2013   Lab Results  Component Value Date   CHOLHDL 5.0 06/06/2021   CHOLHDL 3.5 05/21/2019   No results found for: LDLDIRECT    Radiology: CARDIAC CATHETERIZATION  Result Date: 06/06/2021   Prox RCA lesion is 60% stenosed.   Prox RCA to Mid RCA lesion is 50% stenosed.   Dist RCA lesion is 30% stenosed.   Mid LAD-1 lesion is 10% stenosed.   Mid LAD-2 lesion is 60% stenosed.   Prox LAD to Mid LAD lesion is 70% stenosed.   Prox Cx to Mid Cx  lesion is 100% stenosed.   A drug-eluting stent was successfully placed using a STENT ONYX FRONTIER [id number].   Post intervention, there is a 0% residual stenosis.   There is mild left ventricular systolic dysfunction.  LV end diastolic pressure is moderately elevated.   The left ventricular ejection fraction is 45-50% by visual estimate. 1.  Significant underlying three-vessel coronary artery disease.  The culprit for lateral STEMI is an occluded proximal left circumflex.  Patent stent in the mid LAD with 70% stenosis in the proximal LAD and 60% disease in the mid LAD distal to the previously placed stent.  In addition, there is borderline disease in the right coronary artery. 2.  Mildly reduced LV systolic function with apical lateral hypokinesis.  Moderately elevated left ventricular end-diastolic pressure. 3.  Successful angioplasty and drug-eluting stent placement to the left circumflex. Recommendations: Treat with dual antiplatelet therapy with aspirin and ticagrelor for now.  Eliquis can be resumed in 1 to 2 days for paroxysmal atrial fibrillation.  Once Eliquis is resumed, recommend stopping aspirin to minimize the risk of bleeding on triple therapy. The rest of his coronary artery disease can likely be treated medically but if he has refractory anginal symptoms, PCI of the proximal LAD can be considered.  DG Chest Portable 1 View  Result Date: 06/08/2021 CLINICAL DATA:  Short of breath, arrhythmia, non-small cell lung cancer EXAM: PORTABLE CHEST 1 VIEW COMPARISON:  02/28/2021, 06/06/2021 FINDINGS: Single frontal view of the chest demonstrates a stable cardiac silhouette. Left suprahilar mass and left upper lobe consolidation again noted, with fiduciary markers unchanged in position. Stable atherosclerosis of the thoracic aorta. No new airspace disease, effusion, or pneumothorax. Prominent skin folds overlie the left apex. There are no acute bony abnormalities. IMPRESSION: 1. No acute airspace disease.  2. Stable left upper consolidation, compatible with post therapeutic changes based on recent PET CT. Electronically Signed   By: Randa Ngo M.D.   On: 06/08/2021 21:27   DG Chest Port 1 View  Result Date: 06/06/2021 CLINICAL DATA:  Chest pain, right shoulder pain EXAM: PORTABLE CHEST 1 VIEW COMPARISON:  10/04/2020 FINDINGS: Left suprahilar mass adjacent to the aortic knob with fiducial markers is again identified and is stable since prior examination. Left-sided volume loss is unchanged. No superimposed focal pulmonary infiltrate. No pneumothorax or pleural effusion. Cardiac size within normal limits. Pulmonary vascularity is normal. No acute bone abnormality. IMPRESSION: No active disease. Stable appearance of left suprahilar mass and associated left-sided volume loss. Electronically Signed   By: Fidela Salisbury M.D.   On: 06/06/2021 01:00   ECHOCARDIOGRAM COMPLETE  Result Date: 06/06/2021    ECHOCARDIOGRAM REPORT   Patient Name:   Scott Gross Date of Exam: 06/06/2021 Medical Rec #:  [id number]       Height:       69.0 in Accession #:    [id number]      Weight:       129.6 lb Date of Birth:  [birth year]       BSA:          1.718 m Patient Age:    43 years        BP:           106/69 mmHg Patient Gender: M               HR:           82 bpm. Exam Location:  [facility] Procedure: 2D Echo, Color Doppler and Cardiac Doppler Indications:     I21.9 Acute myocardial infarction, unspecified  History:         Patient has no prior history of Echocardiogram examinations.  CAD; Risk Factors:Hypertension and Dyslipidemia.  Sonographer:     Charmayne Sheer Referring Phys:  [id number] Rise Mu Diagnosing Phys: Yolonda Kida MD  Sonographer Comments: Global longitudinal strain was attempted. IMPRESSIONS  1. Lateral Inferior Hypo.  2. Left ventricular ejection fraction, by estimation, is 45 to 50%. The left ventricle has mildly decreased function. The left ventricle demonstrates regional wall motion  abnormalities (see scoring diagram/findings for description). Left ventricular diastolic parameters were normal.  3. Right ventricular systolic function is normal. The right ventricular size is normal.  4. The mitral valve is normal in structure. No evidence of mitral valve regurgitation.  5. The aortic valve is normal in structure. Aortic valve regurgitation is not visualized. Mild aortic valve sclerosis is present, with no evidence of aortic valve stenosis. FINDINGS  Left Ventricle: Left ventricular ejection fraction, by estimation, is 45 to 50%. The left ventricle has mildly decreased function. The left ventricle demonstrates regional wall motion abnormalities. The left ventricular internal cavity size was normal in size. There is borderline concentric left ventricular hypertrophy. Left ventricular diastolic parameters were normal. Right Ventricle: The right ventricular size is normal. No increase in right ventricular wall thickness. Right ventricular systolic function is normal. Left Atrium: Left atrial size was normal in size. Right Atrium: Right atrial size was normal in size. Pericardium: There is no evidence of pericardial effusion. Mitral Valve: The mitral valve is normal in structure. No evidence of mitral valve regurgitation. MV peak gradient, 1.1 mmHg. The mean mitral valve gradient is 1.0 mmHg. Tricuspid Valve: The tricuspid valve is normal in structure. Tricuspid valve regurgitation is trivial. Aortic Valve: The aortic valve is normal in structure. Aortic valve regurgitation is not visualized. Aortic regurgitation PHT measures 416 msec. Mild aortic valve sclerosis is present, with no evidence of aortic valve stenosis. Aortic valve mean gradient  measures 2.0 mmHg. Aortic valve peak gradient measures 4.4 mmHg. Aortic valve area, by VTI measures 1.98 cm. Pulmonic Valve: The pulmonic valve was normal in structure. Pulmonic valve regurgitation is not visualized. Aorta: The ascending aorta was not well  visualized. IAS/Shunts: No atrial level shunt detected by color flow Doppler. Additional Comments: Lateral Inferior Hypo.  LEFT VENTRICLE PLAX 2D LVIDd:         3.80 cm  Diastology LVIDs:         2.50 cm  LV e' medial:    5.00 cm/s LV PW:         0.70 cm  LV E/e' medial:  11.1 LV IVS:        0.70 cm  LV e' lateral:   2.94 cm/s LVOT diam:     1.90 cm  LV E/e' lateral: 18.8 LV SV:         39 LV SV Index:   22 LVOT Area:     2.84 cm  RIGHT VENTRICLE RV Basal diam:  2.60 cm TAPSE (M-mode): 1.9 cm LEFT ATRIUM             Index       RIGHT ATRIUM          Index LA diam:        2.40 cm 1.40 cm/m  RA Area:     7.88 cm LA Vol (A2C):   36.8 ml 21.42 ml/m RA Volume:   13.50 ml 7.86 ml/m LA Vol (A4C):   27.3 ml 15.89 ml/m LA Biplane Vol: 32.6 ml 18.97 ml/m  AORTIC VALVE  PULMONIC VALVE AV Area (Vmax):    2.01 cm    PV Vmax:          0.79 m/s AV Area (Vmean):   2.01 cm    PV Vmean:         53.200 cm/s AV Area (VTI):     1.98 cm    PV VTI:           0.153 m AV Vmax:           105.00 cm/s PV Peak grad:     2.5 mmHg AV Vmean:          73.900 cm/s PV Mean grad:     1.0 mmHg AV VTI:            0.195 m     PR End Diast Vel: 5.57 msec AV Peak Grad:      4.4 mmHg AV Mean Grad:      2.0 mmHg LVOT Vmax:         74.40 cm/s LVOT Vmean:        52.500 cm/s LVOT VTI:          0.136 m LVOT/AV VTI ratio: 0.70 AI PHT:            416 msec  AORTA Ao Root diam: 3.00 cm MITRAL VALVE               TRICUSPID VALVE MV Area (PHT): 4.49 cm    TR Peak grad:   27.2 mmHg MV Area VTI:   3.17 cm    TR Vmax:        261.00 cm/s MV Peak grad:  1.1 mmHg MV Mean grad:  1.0 mmHg    SHUNTS MV Vmax:       0.53 m/s    Systemic VTI:  0.14 m MV Vmean:      36.8 cm/s   Systemic Diam: 1.90 cm MV Decel Time: 169 msec MV E velocity: 55.30 cm/s MV A velocity: 48.80 cm/s MV E/A ratio:  1.13 Layann Bluett D Abhishek Levesque MD Electronically signed by Yolonda Kida MD Signature Date/Time: 06/06/2021/1:36:37 PM    Final     EKG: Normal sinus rhythm nonspecific  ST-T wave changes  ASSESSMENT AND PLAN:  Atrial fibrillation paroxysmal STEMI Coronary artery disease Angina Hyperlipidemia Diabetes . Plan Recommend amiodarone IV load and then switch to p.o. 200 mg twice a day for 1 week then 200 mg a day Continue metoprolol 25 mg a day Maintain lipid therapy with Lipitor Continue diabetes management control Maintain aspirin 81 mg for 30 days continue Brilinta for at least 12 months Resume Eliquis therapy for anticoagulation for A. Fib Continue PPIs for reflux symptoms Hopefully discharge home later today Early follow-up with cardiology  Signed: Yolonda Kida MD 06/09/2021, 9:50 PM

## 2021-06-09 NOTE — Progress Notes (Signed)
Patient ID: Scott Gross, male   DOB: 1934/04/23, 87 y.o.   MRN: [id number]  4:30 pm   D/w Dr Clayborn Bigness via secure chat --he will call pt's daughter to inform about pt to take Brilinta + eliquis + ASA and Amiodarone 200 mg bid and ignore previous instructions regarding holding Brilinta till Monday.

## 2021-06-09 NOTE — ED Notes (Signed)
Per Dr. Clayborn Bigness, patient to be discharged home later today post 6 hour infusion of amiodarone. Patient to hold Brilinta until follow up with Dr. Clayborn Bigness on Monday.

## 2021-06-09 NOTE — ED Notes (Signed)
Pt's spouse given recliner, blanket and pillow; pt given urinal

## 2021-06-09 NOTE — Discharge Summary (Addendum)
[facility] [place] NAME: Scott Gross    MR#:  [id number]  DATE OF BIRTH:  July 27, 1934  DATE OF ADMISSION:  06/08/2021 ADMITTING PHYSICIAN: Fritzi Mandes, MD  DATE OF DISCHARGE: 06/09/2021  PRIMARY CARE PHYSICIAN: [name], MD    ADMISSION DIAGNOSIS:  Atrial fibrillation with rapid ventricular response ([place]) [I48.91] A-fib ([place]) [I48.91]  DISCHARGE DIAGNOSIS:  a fib with RVR Recent STEMI with stent placement  SECONDARY DIAGNOSIS:   Past Medical History:  Diagnosis Date   Arthritis    CAD (coronary artery disease) Nov. 12, 2012   Inferior ST elevation MI in November of 2012 with ventricular fibrillation arrest. Status post drug-eluting stent placement to the mid LAD. Most recent cardiac catheterization in June of this year showed patent stent with minimal restenosis, 75% proximal LAD and 80% distal LAD which were unchanged from before. Mild disease in the left circumflex and moderate RCA disease. Ejection fraction was 55%.   Cancer of upper lobe of left lung ([place]) 11/2016   Rad tx's.   Cardiac arrest [facility]) Nov. 2012   x 2    Cardiac arrest - ventricular fibrillation    HOH (hard of hearing)    Left Hearing Aid   Hyperlipidemia    Hypertension    Post PTCA 2013   x2   ST elevation MI (STEMI) ([place]) 08/26/2011    HOSPITAL COURSE:   COBEY Gross is a 87 y.o. male with medical history significant of acute ST elevation MI involving the lateral wall who was discharged today after cardiac cath, diabetes, paroxysmal atrial fibrillation, protein calorie malnutrition on chronic anticoagulation who was brought in secondary to palpitation.  Patient had history of A. fib previously on Eliquis.  He did have previous stent to LAD and now has lateral STEMI for which he was found to have disease in the LAD and RCA.  He had a stent placement to the left circumflex but the rest will be medically managed.  Patient left and came back.  He was  found to be in A. fib with RVR.  A fib with RVR -- patient was started on IV Cardizem drip. Currently in sinus rhythm. -- Cardizem drip discontinued -- resume home dose of beta-blockers -- start patient on eliquis per Dr. Clayborn Bigness -- hold Brilinta over the weekend due to losing from cath site per Dr. Clayborn Bigness recommendation. Patient will be assessed on Monday in the office by cardiology to determine if it can be started -- continue aspirin  recent STEMI status post PCI and stent placement to left circumflex -- continue aspirin -- continue beta-blockers -- patient allergic to statins  Hypertension --cont BB and Losartan/HCTZ  patient ambulated in the hallways well. Overall hemodynamically stable. Will discharged to home with outpatient follow-up cardiology on Monday. Above instructions were given to patient's wife in the ER.   CONSULTS OBTAINED:    DRUG ALLERGIES:   Allergies  Allergen Reactions   Statins Other (See Comments)    Arthralagia    DISCHARGE MEDICATIONS:   Allergies as of 06/09/2021       Reactions   Statins Other (See Comments)   Arthralagia        Medication List     STOP taking these medications    ticagrelor 90 MG Tabs tablet Commonly known as: BRILINTA       TAKE these medications    albuterol 108 (90 Base) MCG/ACT inhaler Commonly known as: VENTOLIN HFA Inhale into the lungs  every 6 (six) hours as needed.   amiodarone 200 MG tablet Commonly known as: Pacerone Take 1 tablet (200 mg total) by mouth 2 (two) times daily.   apixaban 5 MG Tabs tablet Commonly known as: ELIQUIS Take 1 tablet (5 mg total) by mouth 2 (two) times daily.   aspirin 81 MG chewable tablet Chew 1 tablet (81 mg total) by mouth daily.   losartan-hydrochlorothiazide 50-12.5 MG tablet Commonly known as: HYZAAR Take 1 tablet by mouth daily.   metoprolol succinate 50 MG 24 hr tablet Commonly known as: TOPROL-XL Take 50 mg by mouth daily.        If you  experience worsening of your admission symptoms, develop shortness of breath, life threatening emergency, suicidal or homicidal thoughts you must seek medical attention immediately by calling 911 or calling your MD immediately  if symptoms less severe.  You Must read complete instructions/literature along with all the possible adverse reactions/side effects for all the Medicines you take and that have been prescribed to you. Take any new Medicines after you have completely understood and accept all the possible adverse reactions/side effects.   Please note  You were cared for by a hospitalist during your hospital stay. If you have any questions about your discharge medications or the care you received while you were in the hospital after you are discharged, you can call the unit and asked to speak with the hospitalist on call if the hospitalist that took care of you is not available. Once you are discharged, your primary care physician will handle any further medical issues. Please note that NO REFILLS for any discharge medications will be authorized once you are discharged, as it is imperative that you return to your primary care physician (or establish a relationship with a primary care physician if you do not have one) for your aftercare needs so that they can reassess your need for medications and monitor your lab values. Today   SUBJECTIVE   denies chest pain. Came in with palpitation. Currently in sinus rhythm.  VITAL SIGNS:  Blood pressure 111/78, pulse 72, temperature 98.8 F (37.1 C), temperature source Oral, resp. rate 18, height 5\' 11"  (1.803 m), weight 62.6 kg, SpO2 96 %.  I/O:   Intake/Output Summary (Last 24 hours) at 06/09/2021 1631 Last data filed at 06/09/2021 0555 Gross per 24 hour  Intake 43.28 ml  Output --  Net 43.28 ml    PHYSICAL EXAMINATION:  GENERAL:  87 y.o.-year-old patient lying in the bed with no acute distress.  LUNGS: Normal breath sounds bilaterally, no  wheezing, rales,rhonchi or crepitation. No use of accessory muscles of respiration.  CARDIOVASCULAR: S1, S2 normal. No murmurs, rubs, or gallops.  ABDOMEN: Soft, non-tender, non-distended. Bowel sounds present. No organomegaly or mass.  EXTREMITIES: No pedal edema, cyanosis, or clubbing.  NEUROLOGIC: Cranial nerves II through XII are intact. Muscle strength 5/5 in all extremities. Sensation intact. Gait not checked.  PSYCHIATRIC: The patient is alert and oriented x 3.  SKIN: No obvious rash, lesion, or ulcer.   DATA REVIEW:   CBC  Recent Labs  Lab 06/09/21 0731  WBC 10.4  HGB 13.0  HCT 36.9*  PLT 161    Chemistries  Recent Labs  Lab 06/06/21 0033 06/06/21 0534 06/08/21 2110 06/09/21 0820  NA 132*   < > 132* 131*  K 3.3*   < > 3.7 2.9*  CL 92*   < > 96* 100  CO2 29   < > 27 25  GLUCOSE  212*   < > 149* 149*  BUN 13   < > 17 13  CREATININE 0.80   < > 1.01 0.75  CALCIUM 8.9   < > 8.7* 7.3*  MG  --    < > 2.1  --   AST 21  --   --   --   ALT 12  --   --   --   ALKPHOS 99  --   --   --   BILITOT 1.1  --   --   --    < > = values in this interval not displayed.    Microbiology Results   Recent Results (from the past 240 hour(s))  Resp Panel by RT-PCR (Flu A&B, Covid) Nasopharyngeal Swab     Status: None   Collection Time: 06/06/21 12:33 AM   Specimen: Nasopharyngeal Swab; Nasopharyngeal(NP) swabs in vial transport medium  Result Value Ref Range Status   SARS Coronavirus 2 by RT PCR NEGATIVE NEGATIVE Final    Comment: (NOTE) SARS-CoV-2 target nucleic acids are NOT DETECTED.  The SARS-CoV-2 RNA is generally detectable in upper respiratory specimens during the acute phase of infection. The lowest concentration of SARS-CoV-2 viral copies this assay can detect is 138 copies/mL. A negative result does not preclude SARS-Cov-2 infection and should not be used as the sole basis for treatment or other patient management decisions. A negative result may occur with  improper  specimen collection/handling, submission of specimen other than nasopharyngeal swab, presence of viral mutation(s) within the areas targeted by this assay, and inadequate number of viral copies(<138 copies/mL). A negative result must be combined with clinical observations, patient history, and epidemiological information. The expected result is Negative.  Fact Sheet for Patients:  EntrepreneurPulse.com.au  Fact Sheet for Healthcare Providers:  IncredibleEmployment.be  This test is no t yet approved or cleared by the [place] FDA and  has been authorized for detection and/or diagnosis of SARS-CoV-2 by FDA under an Emergency Use Authorization (EUA). This EUA will remain  in effect (meaning this test can be used) for the duration of the COVID-19 declaration under Section 564(b)(1) of the Act, 21 U.S.C.section 360bbb-3(b)(1), unless the authorization is terminated  or revoked sooner.       Influenza A by PCR NEGATIVE NEGATIVE Final   Influenza B by PCR NEGATIVE NEGATIVE Final    Comment: (NOTE) The Xpert Xpress SARS-CoV-2/FLU/RSV plus assay is intended as an aid in the diagnosis of influenza from Nasopharyngeal swab specimens and should not be used as a sole basis for treatment. Nasal washings and aspirates are unacceptable for Xpert Xpress SARS-CoV-2/FLU/RSV testing.  Fact Sheet for Patients: EntrepreneurPulse.com.au  Fact Sheet for Healthcare Providers: IncredibleEmployment.be  This test is not yet approved or cleared by the [place] FDA and has been authorized for detection and/or diagnosis of SARS-CoV-2 by FDA under an Emergency Use Authorization (EUA). This EUA will remain in effect (meaning this test can be used) for the duration of the COVID-19 declaration under Section 564(b)(1) of the Act, 21 U.S.C. section 360bbb-3(b)(1), unless the authorization is terminated or revoked.  Performed at  [facility], [address] [place], [place] [address]   MRSA Next Gen by PCR, Nasal     Status: None   Collection Time: 06/06/21  2:15 AM   Specimen: Nasal Mucosa; Nasal Swab  Result Value Ref Range Status   MRSA by PCR Next Gen NOT DETECTED NOT DETECTED Final    Comment: (NOTE) The GeneXpert MRSA Assay (  FDA approved for NASAL specimens only), is one component of a comprehensive MRSA colonization surveillance program. It is not intended to diagnose MRSA infection nor to guide or monitor treatment for MRSA infections. Test performance is not FDA approved in patients less than 32 years old. Performed at [facility], [address] [place], [place] [address]   Resp Panel by RT-PCR (Flu A&B, Covid) Nasopharyngeal Swab     Status: None   Collection Time: 06/08/21  9:10 PM   Specimen: Nasopharyngeal Swab; Nasopharyngeal(NP) swabs in vial transport medium  Result Value Ref Range Status   SARS Coronavirus 2 by RT PCR NEGATIVE NEGATIVE Final    Comment: (NOTE) SARS-CoV-2 target nucleic acids are NOT DETECTED.  The SARS-CoV-2 RNA is generally detectable in upper respiratory specimens during the acute phase of infection. The lowest concentration of SARS-CoV-2 viral copies this assay can detect is 138 copies/mL. A negative result does not preclude SARS-Cov-2 infection and should not be used as the sole basis for treatment or other patient management decisions. A negative result may occur with  improper specimen collection/handling, submission of specimen other than nasopharyngeal swab, presence of viral mutation(s) within the areas targeted by this assay, and inadequate number of viral copies(<138 copies/mL). A negative result must be combined with clinical observations, patient history, and epidemiological information. The expected result is Negative.  Fact Sheet for Patients:  EntrepreneurPulse.com.au  Fact Sheet for Healthcare Providers:   IncredibleEmployment.be  This test is no t yet approved or cleared by the [place] FDA and  has been authorized for detection and/or diagnosis of SARS-CoV-2 by FDA under an Emergency Use Authorization (EUA). This EUA will remain  in effect (meaning this test can be used) for the duration of the COVID-19 declaration under Section 564(b)(1) of the Act, 21 U.S.C.section 360bbb-3(b)(1), unless the authorization is terminated  or revoked sooner.       Influenza A by PCR NEGATIVE NEGATIVE Final   Influenza B by PCR NEGATIVE NEGATIVE Final    Comment: (NOTE) The Xpert Xpress SARS-CoV-2/FLU/RSV plus assay is intended as an aid in the diagnosis of influenza from Nasopharyngeal swab specimens and should not be used as a sole basis for treatment. Nasal washings and aspirates are unacceptable for Xpert Xpress SARS-CoV-2/FLU/RSV testing.  Fact Sheet for Patients: EntrepreneurPulse.com.au  Fact Sheet for Healthcare Providers: IncredibleEmployment.be  This test is not yet approved or cleared by the [place] FDA and has been authorized for detection and/or diagnosis of SARS-CoV-2 by FDA under an Emergency Use Authorization (EUA). This EUA will remain in effect (meaning this test can be used) for the duration of the COVID-19 declaration under Section 564(b)(1) of the Act, 21 U.S.C. section 360bbb-3(b)(1), unless the authorization is terminated or revoked.  Performed at [facility], [address] [place],  [address]     RADIOLOGY:  DG Chest Portable 1 View  Result Date: 06/08/2021 CLINICAL DATA:  Short of breath, arrhythmia, non-small cell lung cancer EXAM: PORTABLE CHEST 1 VIEW COMPARISON:  02/28/2021, 06/06/2021 FINDINGS: Single frontal view of the chest demonstrates a stable cardiac silhouette. Left suprahilar mass and left upper lobe consolidation again noted, with fiduciary markers unchanged in  position. Stable atherosclerosis of the thoracic aorta. No new airspace disease, effusion, or pneumothorax. Prominent skin folds overlie the left apex. There are no acute bony abnormalities. IMPRESSION: 1. No acute airspace disease. 2. Stable left upper consolidation, compatible with post therapeutic changes based on recent PET CT. Electronically Signed   By: Legrand Como  Owens Shark M.D.   On: 06/08/2021 21:27     CODE STATUS:     Code Status Orders  (From admission, onward)           Start     Ordered   06/09/21 0707  Full code  Continuous        06/09/21 0706           Code Status History     Date Active Date Inactive Code Status Order ID Comments User Context   06/06/2021 0327 06/08/2021 1738 Full Code [id number]  Athena Masse, MD Inpatient   06/06/2021 0200 06/06/2021 0327 Full Code [id number]  Wellington Hampshire, MD Inpatient   06/21/2019 0540 06/22/2019 1440 Full Code [id number]  Harrie Foreman, MD Inpatient   05/23/2019 2340 05/25/2019 1431 Full Code [id number]  Lance Coon, MD Inpatient   05/20/2019 1449 05/21/2019 1727 Full Code [id number]  Saundra Shelling, MD ED        TOTAL TIME TAKING CARE OF THIS PATIENT: 35 minutes.    Fritzi Mandes M.D  [facility]    CC: Primary care physician; [name], MD

## 2021-06-09 NOTE — ED Notes (Signed)
Per nightshift RN patient has been in NSR all evening. Hospitalist messaged about discontinuing diltiazem gtt. Patient sleeping, wife at bedside. Vitals stable.

## 2021-06-09 NOTE — ED Notes (Signed)
Patients inpatient orders never released, released at this time. Delay in morning labs.

## 2021-06-09 NOTE — ED Notes (Signed)
Patient provided with water and milk, awaiting breakfast. Denies chest pain.

## 2021-06-09 NOTE — ED Notes (Signed)
Cardiology at bedside.

## 2021-06-09 NOTE — Progress Notes (Signed)
Brief cardiology note  Patient recently discharged after STEMI At home patient developed paroxysmal rapid atrial fibrillation up to 160 Patient was relatively hemodynamically stable but slightly low blood pressures in the 90s Patient felt as though he was asymptomatic Seen in the emergency room placed on diltiazem for rate management and control Replaced back on Eliquis Patient has since converted back to sinus rhythm Feels reasonably well Currently on aspirin Brilinta Eliquis and metoprolol diltiazem was discontinued Would recommend IV amiodarone load and drip and subsequently transition to p.o. hopefully with the idea of discharging him on a twice a day regimen possibly later today if he remains stable After the initial load in the first 6-hour infusion then we could potentially transition to amiodarone 200 mg twice a day discharge home with early follow-up with cardiology Thanks, [facility]

## 2021-06-09 NOTE — Discharge Instructions (Signed)
Discussed with patient and wife to hold Brilinta till seen by Dr. Clayborn Bigness on Monday in the office start taking eliquis and amiodarone as prescribed

## 2021-06-13 ENCOUNTER — Emergency Department: Payer: Medicare HMO

## 2021-06-13 ENCOUNTER — Other Ambulatory Visit: Payer: Self-pay

## 2021-06-13 ENCOUNTER — Emergency Department
Admission: EM | Admit: 2021-06-13 | Discharge: 2021-06-13 | Disposition: A | Discharge status: Home / Self Care | Payer: Medicare HMO | Attending: Emergency Medicine | Admitting: Emergency Medicine

## 2021-06-13 DIAGNOSIS — R7989 Other specified abnormal findings of blood chemistry: Secondary | ICD-10-CM | POA: Insufficient documentation

## 2021-06-13 DIAGNOSIS — R0682 Tachypnea, not elsewhere classified: Secondary | ICD-10-CM | POA: Diagnosis not present

## 2021-06-13 DIAGNOSIS — Z87891 Personal history of nicotine dependence: Secondary | ICD-10-CM | POA: Insufficient documentation

## 2021-06-13 DIAGNOSIS — Z7901 Long term (current) use of anticoagulants: Secondary | ICD-10-CM | POA: Diagnosis not present

## 2021-06-13 DIAGNOSIS — Z7982 Long term (current) use of aspirin: Secondary | ICD-10-CM | POA: Insufficient documentation

## 2021-06-13 DIAGNOSIS — I480 Paroxysmal atrial fibrillation: Secondary | ICD-10-CM | POA: Diagnosis not present

## 2021-06-13 DIAGNOSIS — I119 Hypertensive heart disease without heart failure: Secondary | ICD-10-CM | POA: Diagnosis not present

## 2021-06-13 DIAGNOSIS — J90 Pleural effusion, not elsewhere classified: Secondary | ICD-10-CM | POA: Diagnosis not present

## 2021-06-13 DIAGNOSIS — Z85118 Personal history of other malignant neoplasm of bronchus and lung: Secondary | ICD-10-CM | POA: Insufficient documentation

## 2021-06-13 DIAGNOSIS — Z20822 Contact with and (suspected) exposure to covid-19: Secondary | ICD-10-CM | POA: Insufficient documentation

## 2021-06-13 DIAGNOSIS — I2510 Atherosclerotic heart disease of native coronary artery without angina pectoris: Secondary | ICD-10-CM | POA: Diagnosis not present

## 2021-06-13 DIAGNOSIS — R0602 Shortness of breath: Secondary | ICD-10-CM | POA: Diagnosis not present

## 2021-06-13 DIAGNOSIS — Z79899 Other long term (current) drug therapy: Secondary | ICD-10-CM | POA: Insufficient documentation

## 2021-06-13 DIAGNOSIS — J441 Chronic obstructive pulmonary disease with (acute) exacerbation: Secondary | ICD-10-CM | POA: Diagnosis not present

## 2021-06-13 DIAGNOSIS — R069 Unspecified abnormalities of breathing: Secondary | ICD-10-CM | POA: Diagnosis not present

## 2021-06-13 LAB — COMPREHENSIVE METABOLIC PANEL
ALT: 18 U/L (ref 0–44)
AST: 22 U/L (ref 15–41)
Albumin: 3.6 g/dL (ref 3.5–5.0)
Alkaline Phosphatase: 83 U/L (ref 38–126)
Anion gap: 8 (ref 5–15)
BUN: 12 mg/dL (ref 8–23)
CO2: 26 mmol/L (ref 22–32)
Calcium: 8.6 mg/dL — ABNORMAL LOW (ref 8.9–10.3)
Chloride: 96 mmol/L — ABNORMAL LOW (ref 98–111)
Creatinine, Ser: 0.96 mg/dL (ref 0.61–1.24)
GFR, Estimated: >60 mL/min (ref >60)
Glucose, Bld: 160 mg/dL — ABNORMAL HIGH (ref 70–99)
Potassium: 4 mmol/L (ref 3.5–5.1)
Sodium: 130 mmol/L — ABNORMAL LOW (ref 135–145)
Total Bilirubin: 0.9 mg/dL (ref 0.3–1.2)
Total Protein: 6.4 g/dL — ABNORMAL LOW (ref 6.5–8.1)

## 2021-06-13 LAB — CBC WITH DIFFERENTIAL/PLATELET
Abs Immature Granulocytes: 0.08 10*3/uL — ABNORMAL HIGH (ref 0.00–0.07)
Basophils Absolute: 0.1 10*3/uL (ref 0.0–0.1)
Basophils Relative: 1 %
Eosinophils Absolute: 0.3 10*3/uL (ref 0.0–0.5)
Eosinophils Relative: 3 %
HCT: 41.5 % (ref 39.0–52.0)
Hemoglobin: 14.6 g/dL (ref 13.0–17.0)
Immature Granulocytes: 1 %
Lymphocytes Relative: 15 %
Lymphs Abs: 1.4 10*3/uL (ref 0.7–4.0)
MCH: 31.7 pg (ref 26.0–34.0)
MCHC: 35.2 g/dL (ref 30.0–36.0)
MCV: 90.2 fL (ref 80.0–100.0)
Monocytes Absolute: 0.9 10*3/uL (ref 0.1–1.0)
Monocytes Relative: 9 %
Neutro Abs: 6.8 10*3/uL (ref 1.7–7.7)
Neutrophils Relative %: 71 %
Platelets: 232 10*3/uL (ref 150–400)
RBC: 4.6 MIL/uL (ref 4.22–5.81)
RDW: 13.1 % (ref 11.5–15.5)
WBC: 9.5 10*3/uL (ref 4.0–10.5)
nRBC: 0 % (ref 0.0–0.2)

## 2021-06-13 LAB — RESP PANEL BY RT-PCR (FLU A&B, COVID) ARPGX2
Influenza A by PCR: NEGATIVE
Influenza B by PCR: NEGATIVE
SARS Coronavirus 2 by RT PCR: NEGATIVE

## 2021-06-13 LAB — MAGNESIUM: Magnesium: 2.1 mg/dL (ref 1.7–2.4)

## 2021-06-13 LAB — TROPONIN I (HIGH SENSITIVITY)
Troponin I (High Sensitivity): 1304 ng/L (ref <18)
Troponin I (High Sensitivity): 1295 ng/L (ref <18)

## 2021-06-13 LAB — BRAIN NATRIURETIC PEPTIDE: B Natriuretic Peptide: 912.6 pg/mL — ABNORMAL HIGH (ref 0.0–100.0)

## 2021-06-13 MED ORDER — PREDNISONE 20 MG PO TABS
60.0000 mg | ORAL_TABLET | Freq: Once | ORAL | Status: AC
  Administered 2021-06-13: 60 mg via ORAL
  Filled 2021-06-13: qty 3

## 2021-06-13 MED ORDER — FUROSEMIDE 10 MG/ML IJ SOLN
20.0000 mg | Freq: Once | INTRAMUSCULAR | Status: AC
  Administered 2021-06-13: 20 mg via INTRAVENOUS
  Filled 2021-06-13: qty 4

## 2021-06-13 MED ORDER — PREDNISONE 10 MG PO TABS: 40.0000 mg | ORAL_TABLET | Freq: Every day | ORAL | 0 refills | Status: AC

## 2021-06-13 MED ORDER — IPRATROPIUM-ALBUTEROL 0.5-2.5 (3) MG/3ML IN SOLN
3.0000 mL | Freq: Once | RESPIRATORY_TRACT | Status: AC
  Administered 2021-06-13: 3 mL via RESPIRATORY_TRACT
  Filled 2021-06-13: qty 3

## 2021-06-13 NOTE — Discharge Instructions (Addendum)
Over the next 24 hours please use your albuterol inhaler every 4-6 hours as needed with 1 to 2 puffs.  If you are feeling significantly worse or develop any new symptoms please return immediately to the emergency room.

## 2021-06-13 NOTE — ED Notes (Addendum)
Pt's daughter at bedside and reports Pt has not started any new medications recently.  Sts his coloring is "off."  Sts she spoke to her mother and the Pt was c/o pain between his shoulders when he woke up this morning.

## 2021-06-13 NOTE — ED Provider Notes (Signed)
[facility] Provider Note  ____________________________________________   Event Date/Time   First MD Initiated Contact with Patient 06/13/21 1742     (approximate)  I have reviewed the triage vital signs and the nursing notes.   HISTORY  Chief Complaint Shortness of Breath   HPI Scott Gross is a 87 y.o. male past medical history of CAD with recent STEMI involving lateral wall having undergone a cath discharged on 8/25 readmitted for 1 day discharged on 8/26 for A. fib with RVR currently on Eliquis and metoprolol also with a history of DM, HTN, remote tobacco abuse who presents via EMS from home for assessment of some shortness of breath he states he developed over the last 2 to 3 days.  He states uses albuterol inhaler this morning and thinks it may have helped a little bit but is not sure.  He denies orthopnea, fever, cough, chest pain, headache and earache, sore throat, nausea, vomiting, diarrhea, dysuria, rash or recent injuries or falls.  Denies any other acute concerns at this time.  States he has been taking his Eliquis and all of his other medicines as directed.         Past Medical History:  Diagnosis Date   Arthritis    CAD (coronary artery disease) Nov. 12, 2012   Inferior ST elevation MI in November of 2012 with ventricular fibrillation arrest. Status post drug-eluting stent placement to the mid LAD. Most recent cardiac catheterization in June of this year showed patent stent with minimal restenosis, 75% proximal LAD and 80% distal LAD which were unchanged from before. Mild disease in the left circumflex and moderate RCA disease. Ejection fraction was 55%.   Cancer of upper lobe of left lung ([place]) 11/2016   Rad tx's.   Cardiac arrest [facility]) Nov. 2012   x 2    Cardiac arrest - ventricular fibrillation    HOH (hard of hearing)    Left Hearing Aid   Hyperlipidemia    Hypertension    Post PTCA 2013   x2   ST elevation MI  (STEMI) ([place]) 08/26/2011    Patient Active Problem List   Diagnosis Date Noted   A-fib ([place]) 06/09/2021   Atrial fibrillation with rapid ventricular response ([place]) 06/08/2021   Acute ST elevation myocardial infarction (STEMI) of lateral wall ([place]) 06/06/2021   Chronic anticoagulation 06/06/2021   Protein-calorie malnutrition, severe 06/06/2021   Malnutrition of moderate degree 06/22/2019   CAP (community acquired pneumonia) 06/21/2019   SIRS (systemic inflammatory response syndrome) ([place]) 05/23/2019   Atrial fibrillation with RVR (HCC)    PAF (paroxysmal atrial fibrillation) ([place]) 05/20/2019   Cardiac arrest ([place])    Diabetes mellitus type 2, uncomplicated ([place]) 44/10/270   Angina pectoris ([place]) 12/17/2016   Adenopathy    Cancer of upper lobe of left lung ([place]) 09/10/2016   Hyperlipidemia    Hypertension    CAD (coronary artery disease) 08/27/2011   Myocardial infarction ([place]) 08/16/2011    Past Surgical History:  Procedure Laterality Date   CARDIAC CATHETERIZATION  2013   @ [place]; [facility] s/p stent    CORONARY ANGIOPLASTY     CORONARY/GRAFT ACUTE MI REVASCULARIZATION N/A 06/06/2021   Procedure: Coronary/Graft Acute MI Revascularization;  Surgeon: Wellington Hampshire, MD;  Location: [place] [facility];  Service: Cardiovascular;  Laterality: N/A;   ELECTROMAGNETIC NAVIGATION BROCHOSCOPY N/A 11/27/2016   Procedure: ELECTROMAGNETIC NAVIGATION BRONCHOSCOPY;  Surgeon: [name], MD;  Location: [facility] ORS;  Service: Cardiopulmonary;  Laterality:  N/A;   LEFT HEART CATH AND CORONARY ANGIOGRAPHY N/A 06/06/2021   Procedure: LEFT HEART CATH AND CORONARY ANGIOGRAPHY;  Surgeon: Wellington Hampshire, MD;  Location: [place] [facility];  Service: Cardiovascular;  Laterality: N/A;    Prior to Admission medications   Medication Sig Start Date End Date Taking? Authorizing Provider  predniSONE (DELTASONE) 10 MG tablet Take 4 tablets (40 mg total) by mouth daily for 4 days. 06/13/21 06/17/21 Yes Lucrezia Starch, MD  albuterol (VENTOLIN HFA) 108 (90 Base) MCG/ACT inhaler Inhale into the lungs every 6 (six) hours as needed. 01/24/21   [provider]  amiodarone (PACERONE) 200 MG tablet Take 1 tablet (200 mg total) by mouth 2 (two) times daily. 06/09/21   Fritzi Mandes, MD  apixaban (ELIQUIS) 5 MG TABS tablet Take 1 tablet (5 mg total) by mouth 2 (two) times daily. 06/09/21   Fritzi Mandes, MD  aspirin 81 MG chewable tablet Chew 1 tablet (81 mg total) by mouth daily. 06/08/21   Sharen Hones, MD  losartan-hydrochlorothiazide (HYZAAR) 50-12.5 MG tablet Take 1 tablet by mouth daily. 10/10/20   [provider]  metoprolol succinate (TOPROL-XL) 50 MG 24 hr tablet Take 50 mg by mouth daily. 05/30/19   [provider]    Allergies Statins  Family History  Problem Relation Age of Onset   Heart attack Mother    Heart attack Father     Social History Social History   Tobacco Use   Smoking status: Former    Packs/day: 1.00    Types: Cigarettes    Quit date: 08/27/2011    Years since quitting: 9.8   Smokeless tobacco: Never  Vaping Use   Vaping Use: Never used  Substance Use Topics   Alcohol use: No   Drug use: No    Review of Systems  Review of Systems  Constitutional:  Negative for chills and fever.  HENT:  Negative for sore throat.   Eyes:  Negative for pain.  Respiratory:  Positive for shortness of breath. Negative for cough and stridor.   Cardiovascular:  Negative for chest pain.  Gastrointestinal:  Negative for vomiting.  Genitourinary:  Negative for dysuria.  Musculoskeletal:  Negative for myalgias.  Skin:  Negative for rash.  Neurological:  Negative for seizures, loss of consciousness and headaches.  Endo/Heme/Allergies:  Bruises/bleeds easily.  Psychiatric/Behavioral:  Negative for suicidal ideas.   All other systems reviewed and are negative.    ____________________________________________   PHYSICAL EXAM:  VITAL SIGNS: ED Triage Vitals   Enc Vitals Group     BP      Pulse      Resp      Temp      Temp src      SpO2      Weight      Height      Head Circumference      Peak Flow      Pain Score      Pain Loc      Pain Edu?      Excl. in [place]?    Vitals:   06/13/21 1930 06/13/21 2000  BP: 137/83 132/81  Pulse: 86 85  Resp: (!) 22 20  Temp:    SpO2: 100% 99%   Physical Exam Vitals and nursing note reviewed.  Constitutional:      Appearance: He is well-developed.  HENT:     Head: Normocephalic and atraumatic.     Right Ear: External ear normal.  Left Ear: External ear normal.     Nose: Nose normal.     Mouth/Throat:     Mouth: Mucous membranes are moist.  Eyes:     Conjunctiva/sclera: Conjunctivae normal.  Cardiovascular:     Rate and Rhythm: Normal rate and regular rhythm.     Heart sounds: No murmur heard. Pulmonary:     Effort: Tachypnea and respiratory distress present.     Breath sounds: Wheezing present.  Abdominal:     Palpations: Abdomen is soft.     Tenderness: There is no abdominal tenderness.  Musculoskeletal:     Cervical back: Neck supple.  Skin:    General: Skin is warm and dry.     Capillary Refill: Capillary refill takes less than 2 seconds.  Neurological:     Mental Status: He is alert and oriented to person, place, and time.  Psychiatric:        Mood and Affect: Mood normal.     ____________________________________________   LABS (all labs ordered are listed, but only abnormal results are displayed)  Labs Reviewed  CBC WITH DIFFERENTIAL/PLATELET - Abnormal; Notable for the following components:      Result Value   Abs Immature Granulocytes 0.08 (*)    All other components within normal limits  COMPREHENSIVE METABOLIC PANEL - Abnormal; Notable for the following components:   Sodium 130 (*)    Chloride 96 (*)    Glucose, Bld 160 (*)    Calcium 8.6 (*)    Total Protein 6.4 (*)    All other components within normal limits  BRAIN NATRIURETIC PEPTIDE - Abnormal;  Notable for the following components:   B Natriuretic Peptide 912.6 (*)    All other components within normal limits  TROPONIN I (HIGH SENSITIVITY) - Abnormal; Notable for the following components:   Troponin I (High Sensitivity) 1,304 (*)    All other components within normal limits  TROPONIN I (HIGH SENSITIVITY) - Abnormal; Notable for the following components:   Troponin I (High Sensitivity) 1,295 (*)    All other components within normal limits  RESP PANEL BY RT-PCR (FLU A&B, COVID) ARPGX2  MAGNESIUM   ____________________________________________  EKG  Sinus rhythm with a ventricular rate of 88, left anterior fascicle block some scattered nonspecific findings but no clear evidence of acute ischemia otherwise. ____________________________________________  RADIOLOGY  ED MD interpretation: Chest x-ray shows some emphysematous changes  Official radiology report(s): DG Chest Portable 1 View  Result Date: 06/13/2021 CLINICAL DATA:  Shortness of breath x2 days. EXAM: PORTABLE CHEST 1 VIEW COMPARISON:  June 08, 2021 FINDINGS: Diffuse, chronic appearing increased interstitial lung markings are seen. Stable left suprahilar mass-like consolidation is noted. A small left pleural effusion is noted. No pneumothorax is identified. The heart size and mediastinal contours are within normal limits. Small radiopaque surgical clips are seen along the inferolateral aspect of the aortic arch on the left. These are present on the prior study. The visualized skeletal structures are unremarkable. IMPRESSION: Stable post therapeutic changes without acute cardiopulmonary disease. Electronically Signed   By: Virgina Norfolk M.D.   On: 06/13/2021 18:38    ____________________________________________   PROCEDURES  Procedure(s) performed (including Critical Care):  .1-3 Lead EKG Interpretation  Date/Time: 06/13/2021 8:44 PM Performed by: Lucrezia Starch, MD Authorized by: Lucrezia Starch, MD      Interpretation: normal     ECG rate assessment: normal     Rhythm: sinus rhythm     Ectopy: none     Conduction:  normal     ____________________________________________   INITIAL IMPRESSION / ASSESSMENT AND PLAN / ED COURSE     Patient presents with above to history exam for assessment of 2 to 3 days of some increased shortness of breath.  He denies any other clear associated sick symptoms.  On arrival he was fairly tachypneic in the mid 20s with otherwise stable vital signs on room air.  On exam he does have wheezing bilaterally and tachypnea noted.  No rales or rhonchi.  Differential includes ACS, PE, pneumonia, COPD exacerbation, anemia, metabolic derangements and heart failure.  Overall have a low suspicion for PE at this time given absence of hypoxia and wheezing heard in history of tobacco abuse and COPD and patient currently stating he is compliant with his Eliquis.  He has no fever or leukocytosis or findings on chest x-ray to suggest acute bacterial pneumonia.  Additional ECG has a nonspecific findings it is sinus rhythm and troponins are downtrending at 1304 followed by 1295 compared to 12,005 days ago.  He is denying any acute chest pain to me and I suspect this is downtrending from his recent admission and I have a low suspicion for acute ischemic event.  I did discuss this with on-call cardiologist Dr.   Saralyn Pilar   Who also did not have any other significant concerns about acute ischemia and given patient has cardiology appointment tomorrow morning did not think he needed to be observed overnight with regard to this.  Given his wheezing and history of obstructive airway disease I did treat him with DuoNeb and prednisone and on reassessment he stated feel much better.  In addition his wheezing is improved.  Suspect this is primary etiology for symptoms although some especially his BNP did return slightly elevated 912.  While patient does not appear grossly volume overloaded is  possible he has a small component of CHF contributing shortness of breath.  He was given a small dose of Lasix and advised that if he needs additional doses his cardiologist tomorrow can prescribe this.  Patient states on my assessment he is feeling much better and defers to go home.  Think this is reasonable.  He is not hypoxic and his tachypnea is improved.  Given stable vitals with otherwise reassuring exam work-up and low suspicion for other immediate life-threatening process at this time I think discharge with plan for cardiology appointment tomorrow morning is reasonable.  Discharged stable condition.  Strict return precautions advised and discussed      ____________________________________________   FINAL CLINICAL IMPRESSION(S) / ED DIAGNOSES  Final diagnoses:  COPD exacerbation (HCC)  Elevated brain natriuretic peptide (BNP) level    Medications  ipratropium-albuterol (DUONEB) 0.5-2.5 (3) MG/3ML nebulizer solution 3 mL (3 mLs Nebulization Given 06/13/21 1859)  furosemide (LASIX) injection 20 mg (20 mg Intravenous Given 06/13/21 1941)  predniSONE (DELTASONE) tablet 60 mg (60 mg Oral Given 06/13/21 1941)     ED Discharge Orders          Ordered    predniSONE (DELTASONE) 10 MG tablet  Daily        06/13/21 2039             Note:  This document was prepared using Dragon voice recognition software and may include unintentional dictation errors.    Lucrezia Starch, MD 06/13/21 2045

## 2021-06-13 NOTE — ED Triage Notes (Signed)
Per EMS, Pt, from home, c/o SOB x 2 days.  Denies pain.  Pt reports he was started on a new medication x 2 days ago.  Pt had cardiac stents placed on 8/23.    Pt sts O2 level drops from 97% to 93% w/ ambulation.  Sts "I'm wheezing like a freight train."

## 2021-06-13 NOTE — ED Notes (Signed)
Pt c/o SOB and asking to be placed on oxygen.  EDP made aware and approved Pt to be placed on 1L [place].  Pt daughter continues to voice concern about Pt's "coloring."

## 2021-06-13 NOTE — ED Notes (Signed)
EKG done

## 2021-06-14 DIAGNOSIS — R0602 Shortness of breath: Secondary | ICD-10-CM | POA: Diagnosis not present

## 2021-06-14 DIAGNOSIS — I701 Atherosclerosis of renal artery: Secondary | ICD-10-CM | POA: Diagnosis not present

## 2021-06-14 DIAGNOSIS — I739 Peripheral vascular disease, unspecified: Secondary | ICD-10-CM | POA: Diagnosis not present

## 2021-06-14 DIAGNOSIS — I2510 Atherosclerotic heart disease of native coronary artery without angina pectoris: Secondary | ICD-10-CM | POA: Diagnosis not present

## 2021-06-14 DIAGNOSIS — I10 Essential (primary) hypertension: Secondary | ICD-10-CM | POA: Diagnosis not present

## 2021-06-14 DIAGNOSIS — R55 Syncope and collapse: Secondary | ICD-10-CM | POA: Diagnosis not present

## 2021-06-14 DIAGNOSIS — I480 Paroxysmal atrial fibrillation: Secondary | ICD-10-CM | POA: Diagnosis not present

## 2021-06-14 DIAGNOSIS — R011 Cardiac murmur, unspecified: Secondary | ICD-10-CM | POA: Diagnosis not present

## 2021-06-14 DIAGNOSIS — I208 Other forms of angina pectoris: Secondary | ICD-10-CM | POA: Diagnosis not present

## 2021-06-15 DIAGNOSIS — J441 Chronic obstructive pulmonary disease with (acute) exacerbation: Secondary | ICD-10-CM | POA: Diagnosis not present

## 2021-06-15 DIAGNOSIS — E119 Type 2 diabetes mellitus without complications: Secondary | ICD-10-CM | POA: Diagnosis not present

## 2021-06-15 DIAGNOSIS — I213 ST elevation (STEMI) myocardial infarction of unspecified site: Secondary | ICD-10-CM | POA: Diagnosis not present

## 2021-06-15 DIAGNOSIS — I2510 Atherosclerotic heart disease of native coronary artery without angina pectoris: Secondary | ICD-10-CM | POA: Diagnosis not present

## 2021-06-15 DIAGNOSIS — E785 Hyperlipidemia, unspecified: Secondary | ICD-10-CM | POA: Diagnosis not present

## 2021-06-15 DIAGNOSIS — Z7901 Long term (current) use of anticoagulants: Secondary | ICD-10-CM | POA: Diagnosis not present

## 2021-06-15 DIAGNOSIS — Z9289 Personal history of other medical treatment: Secondary | ICD-10-CM | POA: Diagnosis not present

## 2021-06-15 DIAGNOSIS — I10 Essential (primary) hypertension: Secondary | ICD-10-CM | POA: Diagnosis not present

## 2021-06-15 DIAGNOSIS — I4891 Unspecified atrial fibrillation: Secondary | ICD-10-CM | POA: Diagnosis not present

## 2021-06-26 DIAGNOSIS — I480 Paroxysmal atrial fibrillation: Secondary | ICD-10-CM | POA: Diagnosis not present

## 2021-06-26 DIAGNOSIS — I2510 Atherosclerotic heart disease of native coronary artery without angina pectoris: Secondary | ICD-10-CM | POA: Diagnosis not present

## 2021-06-26 DIAGNOSIS — R55 Syncope and collapse: Secondary | ICD-10-CM | POA: Diagnosis not present

## 2021-06-26 DIAGNOSIS — I701 Atherosclerosis of renal artery: Secondary | ICD-10-CM | POA: Diagnosis not present

## 2021-06-26 DIAGNOSIS — I208 Other forms of angina pectoris: Secondary | ICD-10-CM | POA: Diagnosis not present

## 2021-06-26 DIAGNOSIS — I2121 ST elevation (STEMI) myocardial infarction involving left circumflex coronary artery: Secondary | ICD-10-CM | POA: Diagnosis not present

## 2021-06-26 DIAGNOSIS — R011 Cardiac murmur, unspecified: Secondary | ICD-10-CM | POA: Diagnosis not present

## 2021-06-26 DIAGNOSIS — R0602 Shortness of breath: Secondary | ICD-10-CM | POA: Diagnosis not present

## 2021-06-26 DIAGNOSIS — I10 Essential (primary) hypertension: Secondary | ICD-10-CM | POA: Diagnosis not present

## 2021-08-01 ENCOUNTER — Inpatient Hospital Stay
Admission: EM | Admit: 2021-08-01 | Discharge: 2021-08-03 | DRG: 291 | Disposition: A | Discharge status: Home / Self Care | Payer: Medicare HMO | Attending: Internal Medicine | Admitting: Internal Medicine

## 2021-08-01 ENCOUNTER — Emergency Department: Payer: Medicare HMO

## 2021-08-01 ENCOUNTER — Encounter: Payer: Self-pay | Admitting: Radiology

## 2021-08-01 DIAGNOSIS — Z974 Presence of external hearing-aid: Secondary | ICD-10-CM | POA: Diagnosis not present

## 2021-08-01 DIAGNOSIS — J90 Pleural effusion, not elsewhere classified: Secondary | ICD-10-CM | POA: Diagnosis not present

## 2021-08-01 DIAGNOSIS — I252 Old myocardial infarction: Secondary | ICD-10-CM | POA: Diagnosis not present

## 2021-08-01 DIAGNOSIS — Z20822 Contact with and (suspected) exposure to covid-19: Secondary | ICD-10-CM | POA: Diagnosis not present

## 2021-08-01 DIAGNOSIS — I480 Paroxysmal atrial fibrillation: Secondary | ICD-10-CM | POA: Diagnosis present

## 2021-08-01 DIAGNOSIS — Z8249 Family history of ischemic heart disease and other diseases of the circulatory system: Secondary | ICD-10-CM

## 2021-08-01 DIAGNOSIS — Z7901 Long term (current) use of anticoagulants: Secondary | ICD-10-CM

## 2021-08-01 DIAGNOSIS — J441 Chronic obstructive pulmonary disease with (acute) exacerbation: Secondary | ICD-10-CM | POA: Diagnosis not present

## 2021-08-01 DIAGNOSIS — J9601 Acute respiratory failure with hypoxia: Secondary | ICD-10-CM | POA: Diagnosis not present

## 2021-08-01 DIAGNOSIS — Z7902 Long term (current) use of antithrombotics/antiplatelets: Secondary | ICD-10-CM

## 2021-08-01 DIAGNOSIS — R0902 Hypoxemia: Secondary | ICD-10-CM | POA: Diagnosis not present

## 2021-08-01 DIAGNOSIS — Z8674 Personal history of sudden cardiac arrest: Secondary | ICD-10-CM | POA: Diagnosis not present

## 2021-08-01 DIAGNOSIS — Z923 Personal history of irradiation: Secondary | ICD-10-CM | POA: Diagnosis not present

## 2021-08-01 DIAGNOSIS — Z8701 Personal history of pneumonia (recurrent): Secondary | ICD-10-CM | POA: Diagnosis not present

## 2021-08-01 DIAGNOSIS — Z888 Allergy status to other drugs, medicaments and biological substances status: Secondary | ICD-10-CM

## 2021-08-01 DIAGNOSIS — M1990 Unspecified osteoarthritis, unspecified site: Secondary | ICD-10-CM | POA: Diagnosis present

## 2021-08-01 DIAGNOSIS — Z79899 Other long term (current) drug therapy: Secondary | ICD-10-CM | POA: Diagnosis not present

## 2021-08-01 DIAGNOSIS — C3412 Malignant neoplasm of upper lobe, left bronchus or lung: Secondary | ICD-10-CM | POA: Diagnosis present

## 2021-08-01 DIAGNOSIS — I110 Hypertensive heart disease with heart failure: Secondary | ICD-10-CM | POA: Diagnosis not present

## 2021-08-01 DIAGNOSIS — I10 Essential (primary) hypertension: Secondary | ICD-10-CM | POA: Diagnosis present

## 2021-08-01 DIAGNOSIS — E876 Hypokalemia: Secondary | ICD-10-CM | POA: Diagnosis present

## 2021-08-01 DIAGNOSIS — E119 Type 2 diabetes mellitus without complications: Secondary | ICD-10-CM

## 2021-08-01 DIAGNOSIS — Z7982 Long term (current) use of aspirin: Secondary | ICD-10-CM | POA: Diagnosis not present

## 2021-08-01 DIAGNOSIS — I5043 Acute on chronic combined systolic (congestive) and diastolic (congestive) heart failure: Secondary | ICD-10-CM | POA: Diagnosis not present

## 2021-08-01 DIAGNOSIS — I5022 Chronic systolic (congestive) heart failure: Secondary | ICD-10-CM | POA: Diagnosis present

## 2021-08-01 DIAGNOSIS — R062 Wheezing: Secondary | ICD-10-CM | POA: Diagnosis not present

## 2021-08-01 DIAGNOSIS — E1165 Type 2 diabetes mellitus with hyperglycemia: Secondary | ICD-10-CM | POA: Diagnosis not present

## 2021-08-01 DIAGNOSIS — I509 Heart failure, unspecified: Secondary | ICD-10-CM | POA: Diagnosis present

## 2021-08-01 DIAGNOSIS — J9600 Acute respiratory failure, unspecified whether with hypoxia or hypercapnia: Secondary | ICD-10-CM | POA: Diagnosis present

## 2021-08-01 DIAGNOSIS — T502X5A Adverse effect of carbonic-anhydrase inhibitors, benzothiadiazides and other diuretics, initial encounter: Secondary | ICD-10-CM | POA: Diagnosis present

## 2021-08-01 DIAGNOSIS — R918 Other nonspecific abnormal finding of lung field: Secondary | ICD-10-CM | POA: Diagnosis present

## 2021-08-01 DIAGNOSIS — Z87891 Personal history of nicotine dependence: Secondary | ICD-10-CM | POA: Diagnosis not present

## 2021-08-01 DIAGNOSIS — Z85118 Personal history of other malignant neoplasm of bronchus and lung: Secondary | ICD-10-CM

## 2021-08-01 DIAGNOSIS — E1169 Type 2 diabetes mellitus with other specified complication: Secondary | ICD-10-CM | POA: Diagnosis present

## 2021-08-01 DIAGNOSIS — I2510 Atherosclerotic heart disease of native coronary artery without angina pectoris: Secondary | ICD-10-CM | POA: Diagnosis not present

## 2021-08-01 DIAGNOSIS — R911 Solitary pulmonary nodule: Secondary | ICD-10-CM | POA: Diagnosis not present

## 2021-08-01 DIAGNOSIS — E785 Hyperlipidemia, unspecified: Secondary | ICD-10-CM | POA: Diagnosis not present

## 2021-08-01 DIAGNOSIS — R0603 Acute respiratory distress: Secondary | ICD-10-CM

## 2021-08-01 DIAGNOSIS — H9192 Unspecified hearing loss, left ear: Secondary | ICD-10-CM | POA: Diagnosis present

## 2021-08-01 DIAGNOSIS — R0602 Shortness of breath: Secondary | ICD-10-CM | POA: Diagnosis not present

## 2021-08-01 DIAGNOSIS — J449 Chronic obstructive pulmonary disease, unspecified: Secondary | ICD-10-CM | POA: Diagnosis not present

## 2021-08-01 DIAGNOSIS — J9811 Atelectasis: Secondary | ICD-10-CM | POA: Diagnosis not present

## 2021-08-01 DIAGNOSIS — I25119 Atherosclerotic heart disease of native coronary artery with unspecified angina pectoris: Secondary | ICD-10-CM | POA: Diagnosis not present

## 2021-08-01 DIAGNOSIS — I5023 Acute on chronic systolic (congestive) heart failure: Secondary | ICD-10-CM | POA: Diagnosis not present

## 2021-08-01 DIAGNOSIS — R7989 Other specified abnormal findings of blood chemistry: Secondary | ICD-10-CM | POA: Diagnosis not present

## 2021-08-01 DIAGNOSIS — Z955 Presence of coronary angioplasty implant and graft: Secondary | ICD-10-CM

## 2021-08-01 LAB — RESP PANEL BY RT-PCR (FLU A&B, COVID) ARPGX2
Influenza A by PCR: NEGATIVE
Influenza B by PCR: NEGATIVE
SARS Coronavirus 2 by RT PCR: NEGATIVE

## 2021-08-01 LAB — CBC WITH DIFFERENTIAL/PLATELET
Abs Immature Granulocytes: 0.08 10*3/uL — ABNORMAL HIGH (ref 0.00–0.07)
Basophils Absolute: 0.1 10*3/uL (ref 0.0–0.1)
Basophils Relative: 0 %
Eosinophils Absolute: 0.2 10*3/uL (ref 0.0–0.5)
Eosinophils Relative: 1 %
HCT: 47.2 % (ref 39.0–52.0)
Hemoglobin: 16.2 g/dL (ref 13.0–17.0)
Immature Granulocytes: 1 %
Lymphocytes Relative: 11 %
Lymphs Abs: 1.7 10*3/uL (ref 0.7–4.0)
MCH: 31.3 pg (ref 26.0–34.0)
MCHC: 34.3 g/dL (ref 30.0–36.0)
MCV: 91.1 fL (ref 80.0–100.0)
Monocytes Absolute: 0.8 10*3/uL (ref 0.1–1.0)
Monocytes Relative: 5 %
Neutro Abs: 12.6 10*3/uL — ABNORMAL HIGH (ref 1.7–7.7)
Neutrophils Relative %: 82 %
Platelets: 195 10*3/uL (ref 150–400)
RBC: 5.18 MIL/uL (ref 4.22–5.81)
RDW: 12.9 % (ref 11.5–15.5)
WBC: 15.4 10*3/uL — ABNORMAL HIGH (ref 4.0–10.5)
nRBC: 0 % (ref 0.0–0.2)

## 2021-08-01 LAB — BASIC METABOLIC PANEL
Anion gap: 11 (ref 5–15)
Anion gap: 16 — ABNORMAL HIGH (ref 5–15)
BUN: 14 mg/dL (ref 8–23)
BUN: 13 mg/dL (ref 8–23)
CO2: 32 mmol/L (ref 22–32)
CO2: 30 mmol/L (ref 22–32)
Calcium: 9 mg/dL (ref 8.9–10.3)
Calcium: 8.4 mg/dL — ABNORMAL LOW (ref 8.9–10.3)
Chloride: 96 mmol/L — ABNORMAL LOW (ref 98–111)
Chloride: 93 mmol/L — ABNORMAL LOW (ref 98–111)
Creatinine, Ser: 1 mg/dL (ref 0.61–1.24)
Creatinine, Ser: 1.01 mg/dL (ref 0.61–1.24)
GFR, Estimated: >60 mL/min (ref >60)
GFR, Estimated: >60 mL/min (ref >60)
Glucose, Bld: 185 mg/dL — ABNORMAL HIGH (ref 70–99)
Glucose, Bld: 177 mg/dL — ABNORMAL HIGH (ref 70–99)
Potassium: 3 mmol/L — ABNORMAL LOW (ref 3.5–5.1)
Potassium: 2.9 mmol/L — ABNORMAL LOW (ref 3.5–5.1)
Sodium: 139 mmol/L (ref 135–145)
Sodium: 139 mmol/L (ref 135–145)

## 2021-08-01 LAB — BRAIN NATRIURETIC PEPTIDE: B Natriuretic Peptide: 1047.2 pg/mL — ABNORMAL HIGH (ref 0.0–100.0)

## 2021-08-01 LAB — TROPONIN I (HIGH SENSITIVITY)
Troponin I (High Sensitivity): 28 ng/L — ABNORMAL HIGH (ref <18)
Troponin I (High Sensitivity): 33 ng/L — ABNORMAL HIGH (ref <18)

## 2021-08-01 LAB — MAGNESIUM: Magnesium: 1.9 mg/dL (ref 1.7–2.4)

## 2021-08-01 LAB — CBG MONITORING, ED: Glucose-Capillary: 222 mg/dL — ABNORMAL HIGH (ref 70–99)

## 2021-08-01 MED ORDER — SODIUM CHLORIDE 0.9% FLUSH
3.0000 mL | INTRAVENOUS | Status: DC | PRN
  Administered 2021-08-01: 3 mL via INTRAVENOUS

## 2021-08-01 MED ORDER — APIXABAN 5 MG PO TABS
5.0000 mg | ORAL_TABLET | Freq: Two times a day (BID) | ORAL | Status: DC
  Administered 2021-08-01 – 2021-08-02 (×3): 5 mg via ORAL
  Filled 2021-08-01 (×3): qty 1

## 2021-08-01 MED ORDER — SODIUM CHLORIDE 0.9% FLUSH
3.0000 mL | Freq: Two times a day (BID) | INTRAVENOUS | Status: DC
  Administered 2021-08-01 – 2021-08-02 (×2): 3 mL via INTRAVENOUS

## 2021-08-01 MED ORDER — IPRATROPIUM-ALBUTEROL 0.5-2.5 (3) MG/3ML IN SOLN
3.0000 mL | Freq: Four times a day (QID) | RESPIRATORY_TRACT | Status: DC | PRN
  Administered 2021-08-02: 3 mL via RESPIRATORY_TRACT
  Filled 2021-08-01: qty 3

## 2021-08-01 MED ORDER — FUROSEMIDE 10 MG/ML IJ SOLN
20.0000 mg | Freq: Once | INTRAMUSCULAR | Status: AC
  Administered 2021-08-01: 20 mg via INTRAVENOUS
  Filled 2021-08-01: qty 4

## 2021-08-01 MED ORDER — POTASSIUM CHLORIDE 10 MEQ/100ML IV SOLN
10.0000 meq | Freq: Once | INTRAVENOUS | Status: AC
  Administered 2021-08-01: 10 meq via INTRAVENOUS
  Filled 2021-08-01: qty 100

## 2021-08-01 MED ORDER — AMIODARONE HCL 200 MG PO TABS
200.0000 mg | ORAL_TABLET | Freq: Every day | ORAL | Status: DC
  Administered 2021-08-01 – 2021-08-02 (×2): 200 mg via ORAL
  Filled 2021-08-01 (×3): qty 1

## 2021-08-01 MED ORDER — IOHEXOL 350 MG/ML SOLN
75.0000 mL | Freq: Once | INTRAVENOUS | Status: AC | PRN
  Administered 2021-08-01: 75 mL via INTRAVENOUS

## 2021-08-01 MED ORDER — IPRATROPIUM-ALBUTEROL 0.5-2.5 (3) MG/3ML IN SOLN
RESPIRATORY_TRACT | Status: AC
  Administered 2021-08-01: 3 mL via RESPIRATORY_TRACT
  Filled 2021-08-01: qty 9

## 2021-08-01 MED ORDER — FUROSEMIDE 10 MG/ML IJ SOLN
20.0000 mg | Freq: Two times a day (BID) | INTRAMUSCULAR | Status: DC
  Administered 2021-08-01 – 2021-08-03 (×4): 20 mg via INTRAVENOUS
  Filled 2021-08-01 (×4): qty 4

## 2021-08-01 MED ORDER — CLOPIDOGREL BISULFATE 75 MG PO TABS
75.0000 mg | ORAL_TABLET | Freq: Every day | ORAL | Status: DC
  Administered 2021-08-01 – 2021-08-02 (×2): 75 mg via ORAL
  Filled 2021-08-01 (×2): qty 1

## 2021-08-01 MED ORDER — ACETAMINOPHEN 325 MG PO TABS: 650.0000 mg | ORAL_TABLET | ORAL | Status: DC | PRN

## 2021-08-01 MED ORDER — POTASSIUM CHLORIDE CRYS ER 20 MEQ PO TBCR
40.0000 meq | EXTENDED_RELEASE_TABLET | Freq: Once | ORAL | Status: AC
  Administered 2021-08-01: 40 meq via ORAL
  Filled 2021-08-01: qty 2

## 2021-08-01 MED ORDER — IPRATROPIUM-ALBUTEROL 0.5-2.5 (3) MG/3ML IN SOLN
3.0000 mL | Freq: Once | RESPIRATORY_TRACT | Status: AC
  Administered 2021-08-01: 3 mL via RESPIRATORY_TRACT

## 2021-08-01 MED ORDER — LOSARTAN POTASSIUM 50 MG PO TABS
50.0000 mg | ORAL_TABLET | Freq: Every day | ORAL | Status: DC
  Administered 2021-08-01 – 2021-08-03 (×3): 50 mg via ORAL
  Filled 2021-08-01 (×3): qty 1

## 2021-08-01 MED ORDER — METHYLPREDNISOLONE SODIUM SUCC 125 MG IJ SOLR: 125.0000 mg | Freq: Once | INTRAMUSCULAR | Status: AC

## 2021-08-01 MED ORDER — ONDANSETRON HCL 4 MG/2ML IJ SOLN: 4.0000 mg | Freq: Four times a day (QID) | INTRAMUSCULAR | Status: DC | PRN

## 2021-08-01 MED ORDER — IPRATROPIUM-ALBUTEROL 0.5-2.5 (3) MG/3ML IN SOLN: 3.0000 mL | Freq: Once | RESPIRATORY_TRACT | Status: AC

## 2021-08-01 MED ORDER — POTASSIUM CHLORIDE CRYS ER 20 MEQ PO TBCR
40.0000 meq | EXTENDED_RELEASE_TABLET | Freq: Every day | ORAL | Status: DC
  Administered 2021-08-01 – 2021-08-03 (×3): 40 meq via ORAL
  Filled 2021-08-01 (×3): qty 2

## 2021-08-01 MED ORDER — METHYLPREDNISOLONE SODIUM SUCC 125 MG IJ SOLR
INTRAMUSCULAR | Status: AC
  Administered 2021-08-01: 125 mg via INTRAVENOUS
  Filled 2021-08-01: qty 2

## 2021-08-01 MED ORDER — SODIUM CHLORIDE 0.9 % IV SOLN: 250.0000 mL | INTRAVENOUS | Status: DC | PRN

## 2021-08-01 MED ORDER — METOPROLOL SUCCINATE ER 50 MG PO TB24
50.0000 mg | ORAL_TABLET | Freq: Every day | ORAL | Status: DC
  Administered 2021-08-01 – 2021-08-03 (×3): 50 mg via ORAL
  Filled 2021-08-01 (×3): qty 1

## 2021-08-01 NOTE — Consult Note (Signed)
[facility] Consultation Note  Patient ID: Scott Gross, MRN: [id number], DOB/AGE: [id number] y.o. Admit date: 08/01/2021   Date of Consult: 08/01/2021 Primary Physician: [name], MD Primary Cardiologist: [facility]  Chief Complaint:  Chief Complaint  Patient presents with   Shortness of Breath   Reason for Consult:  Congestive heart failure  HPI: 87 y.o. male with known apparent previous upper lobe of left lung cancer previous atrial fibrillation PCI and stent placement of circumflex after ST elevation myocardial infarction several months prior having recurrent admission to the hospital with hypoxia and shortness of breath.  The patient an EKG showing normal sinus rhythm left atrial enlargement left anterior fascicular block.  Chest x-ray and CT scan showed pleural effusions but no evidence of major for pulmonary edema.  Troponin was 33 and BNP is 1047.  The patient has had some improvements of his shortness of breath with intravenous Lasix since still here is hemodynamically stable at this time.  It is unclear to the significance of the primary cause of admission other than hypoxia.  Past Medical History:  Diagnosis Date   Arthritis    CAD (coronary artery disease) Nov. 12, 2012   Inferior ST elevation MI in November of 2012 with ventricular fibrillation arrest. Status post drug-eluting stent placement to the mid LAD. Most recent cardiac catheterization in June of this year showed patent stent with minimal restenosis, 75% proximal LAD and 80% distal LAD which were unchanged from before. Mild disease in the left circumflex and moderate RCA disease. Ejection fraction was 55%.   Cancer of upper lobe of left lung ([place]) 11/2016   Rad tx's.   Cardiac arrest [facility]) Nov. 2012   x 2    Cardiac arrest - ventricular fibrillation    HOH (hard of hearing)    Left Hearing Aid   Hyperlipidemia    Hypertension    Post PTCA 2013   x2   ST elevation MI (STEMI) ([place])  08/26/2011      Surgical History:  Past Surgical History:  Procedure Laterality Date   CARDIAC CATHETERIZATION  2013   @ [place]; [place] s/p stent    CORONARY ANGIOPLASTY     CORONARY/GRAFT ACUTE MI REVASCULARIZATION N/A 06/06/2021   Procedure: Coronary/Graft Acute MI Revascularization;  Surgeon: Wellington Hampshire, MD;  Location: [facility];  Service: Cardiovascular;  Laterality: N/A;   ELECTROMAGNETIC NAVIGATION BROCHOSCOPY N/A 11/27/2016   Procedure: ELECTROMAGNETIC NAVIGATION BRONCHOSCOPY;  Surgeon: [name], MD;  Location: [facility];  Service: Cardiopulmonary;  Laterality: N/A;   LEFT HEART CATH AND CORONARY ANGIOGRAPHY N/A 06/06/2021   Procedure: LEFT HEART CATH AND CORONARY ANGIOGRAPHY;  Surgeon: Wellington Hampshire, MD;  Location: [facility];  Service: Cardiovascular;  Laterality: N/A;     Home Meds: Prior to Admission medications   Medication Sig Start Date End Date Taking? Authorizing Provider  amiodarone (PACERONE) 200 MG tablet Take 1 tablet (200 mg total) by mouth 2 (two) times daily. Patient taking differently: Take 200 mg by mouth daily. 06/09/21  Yes Fritzi Mandes, MD  apixaban (ELIQUIS) 5 MG TABS tablet Take 1 tablet (5 mg total) by mouth 2 (two) times daily. 06/09/21  Yes Fritzi Mandes, MD  clopidogrel (PLAVIX) 75 MG tablet Take 75 mg by mouth daily. 06/22/21  Yes [provider]  losartan-hydrochlorothiazide (HYZAAR) 50-12.5 MG tablet Take 1 tablet by mouth daily. 10/10/20  Yes [provider]  metoprolol succinate (TOPROL-XL) 50 MG 24 hr tablet Take 50 mg  by mouth daily. 05/30/19  Yes [provider]  torsemide (DEMADEX) 20 MG tablet Take 20 mg by mouth daily. 06/22/21  Yes [provider]  albuterol (VENTOLIN HFA) 108 (90 Base) MCG/ACT inhaler Inhale into the lungs every 6 (six) hours as needed. 01/24/21   [provider]  aspirin 81 MG chewable tablet Chew 1 tablet (81 mg total) by mouth daily. Patient not taking: No  sig reported 06/08/21   Sharen Hones, MD  rosuvastatin (CRESTOR) 5 MG tablet Take 2.5 mg by mouth daily. Patient not taking: No sig reported 06/26/21   [provider]    Inpatient Medications:   amiodarone  200 mg Oral Daily   apixaban  5 mg Oral BID   clopidogrel  75 mg Oral Daily   furosemide  20 mg Intravenous BID   losartan  50 mg Oral Daily   metoprolol succinate  50 mg Oral Daily   potassium chloride  40 mEq Oral Daily   sodium chloride flush  3 mL Intravenous Q12H    sodium chloride      Allergies:  Allergies  Allergen Reactions   Statins Other (See Comments)    Arthralagia    Social History   Socioeconomic History   Marital status: Married    Spouse name: Not on file   Number of children: Not on file   Years of education: Not on file   Highest education level: Not on file  Occupational History   Not on file  Tobacco Use   Smoking status: Former    Packs/day: 1.00    Types: Cigarettes    Quit date: 08/27/2011    Years since quitting: 9.9   Smokeless tobacco: Never  Vaping Use   Vaping Use: Never used  Substance and Sexual Activity   Alcohol use: No   Drug use: No   Sexual activity: Not Currently  Other Topics Concern   Not on file  Social History Narrative   Not on file   Social Determinants of Health   Financial Resource Strain: Not on file  Food Insecurity: Not on file  Transportation Needs: Not on file  Physical Activity: Not on file  Stress: Not on file  Social Connections: Not on file  Intimate Partner Violence: Not on file     Family History  Problem Relation Age of Onset   Heart attack Mother    Heart attack Father      Review of Systems Positive for shortness of breath Negative for: General:  chills, fever, night sweats or weight changes.  Cardiovascular: PND orthopnea syncope dizziness  Dermatological skin lesions rashes Respiratory: Cough congestion Urologic: Frequent urination urination at night and  hematuria Abdominal: negative for nausea, vomiting, diarrhea, bright red blood per rectum, melena, or hematemesis Neurologic: negative for visual changes, and/or hearing changes  All other systems reviewed and are otherwise negative except as noted above.  Labs: No results for input(s): CKTOTAL, CKMB, TROPONINI in the last 72 hours. Lab Results  Component Value Date   WBC 15.4 (H) 08/01/2021   HGB 16.2 08/01/2021   HCT 47.2 08/01/2021   MCV 91.1 08/01/2021   PLT 195 08/01/2021    Recent Labs  Lab 08/01/21 0735  NA 139  K 2.9*  CL 93*  CO2 30  BUN 13  CREATININE 1.01  CALCIUM 8.4*  GLUCOSE 177*   Lab Results  Component Value Date   CHOL 176 06/06/2021   HDL 35 (L) 06/06/2021   LDLCALC 101 (H) 06/06/2021  TRIG 202 (H) 06/06/2021   No results found for: DDIMER  Radiology/Studies:  CT Angio Chest PE W and/or Wo Contrast  Result Date: 08/01/2021 CLINICAL DATA:  Lung carcinoma post radiation therapy, shortness of breath, cough, high prob PE suspected EXAM: CT ANGIOGRAPHY CHEST WITH CONTRAST TECHNIQUE: Multidetector CT imaging of the chest was performed using the standard protocol during bolus administration of intravenous contrast. Multiplanar CT image reconstructions and MIPs were obtained to evaluate the vascular anatomy. CONTRAST:  87mL OMNIPAQUE IOHEXOL 350 MG/ML SOLN COMPARISON:  PET-CT 02/28/2021 and previous FINDINGS: Cardiovascular: Heart size normal. Small volume pericardial fluid. The RV is nondilated. Satisfactory opacification of pulmonary arteries noted, and there is no evidence of pulmonary emboli. There is narrowing of distal left pulmonary artery with chronic occlusion of medial left upper lobe segmental branches. There is nonopacification of the superior left pulmonary vein. Coronary calcifications. Incomplete contrast opacification of the thoracic aorta. No evidence of aneurysm or proximal dissection. Scattered calcified atheromatous plaque in the isthmus and  descending thoracic aorta. Mediastinum/Nodes: No definite mediastinal adenopathy. Confluent medial left upper lobe scarring extending to the hilum with fiducial markers as before. No mediastinal mass. Lungs/Pleura: Chronic left upper lobe scarring and consolidation with fiducial markers. Small bilateral pleural effusions left greater than right, new since previous, without pleural enhancement nodularity or loculation. Pulmonary emphysema. Dependent atelectasis at the right lung base. 3 nodules in the left lower lobe (Im36, 39, 61,Se6) are new since previous. There is also a cavitary 6 mm nodule in the anterior left lower lobe image 29, new since previous. Upper Abdomen: No acute finding or mass. Musculoskeletal: Mixed lytic/sclerotic lesion in the left side of the T10 vertebral body as previously described. No acute fracture. Review of the MIP images confirms the above findings. IMPRESSION: 1. Negative for acute PE or proximal thoracic aortic dissection. 2. 4 small left pulmonary nodules which were not evident on the prior study suggesting progression of disease. 3. New bilateral pleural effusions, left greater than right. Electronically Signed   By: Lucrezia Europe M.D.   On: 08/01/2021 07:50   DG Chest Portable 1 View  Result Date: 08/01/2021 CLINICAL DATA:  87 year old male with shortness of breath. Wheezing. History of treated non-small cell lung cancer in 2018. EXAM: PORTABLE CHEST 1 VIEW COMPARISON:  Portable chest 06/13/2021 and earlier. FINDINGS: Portable AP upright view at 0425 hours. Skin fold artifact right greater than left. No convincing pneumothorax. Larger lung volumes compared to August. Chronic coarse pulmonary interstitial opacity which is asymmetrically greater on the right. Chronic post treatment changes and gas trapping in the left upper lung and along the left superior mediastinum appears stable. Other mediastinal contours appear stable. No convincing acute pulmonary opacity. Trachea appears  stable. No acute osseous abnormality identified. IMPRESSION: Skin fold artifact more pronounced in the right upper lung. Chronic lung disease and chronic post treatment changes to the left upper lobe with no acute cardiopulmonary abnormality. Electronically Signed   By: Genevie Ann M.D.   On: 08/01/2021 05:32    EKG: Normal sinus rhythm left atrial enlargement left anterior fascicular block  Weights: Filed Weights   08/01/21 0421  Weight: 62.6 kg     Physical Exam: Blood pressure (!) 145/85, pulse 78, temperature (!) 97.5 F (36.4 C), temperature source Oral, resp. rate 17, height 5\' 11"  (1.803 m), weight 62.6 kg, SpO2 94 %. Body mass index is 19.25 kg/m. General: Well developed, well nourished, in no acute distress. Head eyes ears nose throat: Normocephalic, atraumatic, sclera  non-icteric, no xanthomas, nares are without discharge. No apparent thyromegaly and/or mass  Lungs: Normal respiratory effort.  Expiratory few wheezes, no rales, no rhonchi.  Heart: RRR with normal S1 S2. no murmur gallop, no rub, PMI is normal size and placement, carotid upstroke normal without bruit, jugular venous pressure is normal Abdomen: Soft, non-tender, non-distended with normoactive bowel sounds. No hepatomegaly. No rebound/guarding. No obvious abdominal masses. Abdominal aorta is normal size without bruit Extremities: No edema. no cyanosis, no clubbing, no ulcers  Peripheral : 2+ bilateral upper extremity pulses, 2+ bilateral femoral pulses, 2+ bilateral dorsal pedal pulse Neuro: Alert and oriented. No facial asymmetry. No focal deficit. Moves all extremities spontaneously. Musculoskeletal: Normal muscle tone without kyphosis Psych:  Responds to questions appropriately with a normal affect.    Assessment: 87 year old male with known coronary artery disease status post PCI and stent placement myocardial infarction paroxysmal nonvalvular atrial fibrillation remaining in normal rhythm with acute shortness of  breath and hypoxia multifactorial in nature with pleural effusions and no current evidence of pulmonary embolism and no evidence of acute coronary syndrome  Plan: 1.  Intravenous Lasix for elevated BNP pleural effusions and hypoxia for concerns of acute on chronic systolic dysfunction heart failure 2.  Continuation of supportive care and oxygenation for possible lung disease and lower lung capacity 3.  No change and metoprolol and losartan for previous myocardial infarction and hypertension control  4.  Single antiplatelet therapy for acute previous myocardial infarction several months prior 5.  Anticoagulation for further risk reduction stroke with paroxysmal nonvalvular atrial fibrillation 6.  Continue amiodarone 200 mg each day for heart rate control and maintenance of normal sinus rhythm although will consider the possibility of amiodarone toxicity as a cause of possible hypoxia 7.  Further treatment options after above  Signed, Corey Skains M.D. [facility] 08/01/2021, 2:02 PM

## 2021-08-01 NOTE — ED Notes (Signed)
Per daughter, patient has history of cancer and mets to the bone Last radiation treatment was August, not a candidate for chemo Patient has hx of emphysema, normally wheezes but has normal O2 sats  Tonight patient with audible wheezing and O2 sats in high 80's on room air

## 2021-08-01 NOTE — H&P (Signed)
History and Physical    Scott Gross QMV:[id number] DOB: 1934/02/07 DOA: 08/01/2021  PCP: [name], MD   Patient coming from: Home  I have personally briefly reviewed patient's old medical records in [place]  Chief Complaint: Shortness of breath  HPI: Scott Gross is a 87 y.o. male with medical history significant for coronary artery disease status post stent angioplasty, diabetes mellitus, paroxysmal A. fib on chronic anticoagulation therapy, history of lung cancer status post radiation therapy who presents to the emergency room via EMS history of worsening shortness of breath but worse on the morning of his presentation. At baseline patient is usually short of breath but woke up in the early hours of the morning on the day of his admission unable to catch his breath and had audible wheezing.  He used his rescue inhaler without any relief in his symptoms and so EMS was called. Per EMS patient had room air pulse oximetry of 79% and received 2 nebulizer treatments in route to the hospital with improvement in his wheezing and hypoxia. Patient does not wear home oxygen. Shortness of breath is associated with a dry cough and wheezing.  He has trace lower extremity swelling and had an episode of emesis but denies having any chest pain, no diaphoresis, no palpitation,no dizziness, no lightheadedness, no abdominal pain, no changes in his bowel habits, no fever, no chills,no urinary symptoms, no headache, no focal deficits, no blurred vision. Labs show sodium 139, potassium 3.9, chloride 93, bicarb 30, glucose 177, BUN 13, creatinine 1.01, calcium 8.4, troponin 28 >> 33, BNP 1047, white count 15.4, hemoglobin 16.2, hematocrit 47.2, MCV 91.1, RDW 12.9, platelet count 195 Respiratory viral panel is negative Chest x-ray reviewed by me shows chronic lung disease and chronic posttreatment changes to the left upper lobe.  No acute cardiopulmonary abnormality. CT angiogram of the chest is  negative for acute PE or proximal thoracic aortic dissection.  4 small left pulmonary nodules which are not evident on prior study suggesting progression of disease.  New bilateral pleural effusions, left greater than right. Twelve-lead EKG reviewed by me shows sinus rhythm with left atrial enlargement.   ED Course: Patient is an 87 year old Caucasian male who presents to the emergency room via EMS for evaluation of worsening shortness of breath from his baseline that woke him up out of his sleep associated with a dry cough and wheezing. He was found to be hypoxic by EMS and had room air pulse oximetry of 79%. CT angiogram of the chest shows bilateral pleural effusions and patient's BP was elevated when he arrived to the emergency room. Patient received a dose of Lasix 20 mg IV x1 dose, potassium supplements and bronchodilator therapy. Patient was ambulated in the emergency room with hopes for discharge home and pulse oximetry dropped to 83% on room air and he is currently on 2 L of oxygen via nasal cannula. He will be admitted to the hospital for further evaluation.  Review of Systems: As per HPI otherwise all other systems reviewed and negative.    Past Medical History:  Diagnosis Date   Arthritis    CAD (coronary artery disease) Nov. 12, 2012   Inferior ST elevation MI in November of 2012 with ventricular fibrillation arrest. Status post drug-eluting stent placement to the mid LAD. Most recent cardiac catheterization in June of this year showed patent stent with minimal restenosis, 75% proximal LAD and 80% distal LAD which were unchanged from before. Mild disease in the left circumflex  and moderate RCA disease. Ejection fraction was 55%.   Cancer of upper lobe of left lung ([place]) 11/2016   Rad tx's.   Cardiac arrest [facility]) Nov. 2012   x 2    Cardiac arrest - ventricular fibrillation    HOH (hard of hearing)    Left Hearing Aid   Hyperlipidemia    Hypertension    Post PTCA 2013   x2    ST elevation MI (STEMI) ([place]) 08/26/2011    Past Surgical History:  Procedure Laterality Date   CARDIAC CATHETERIZATION  2013   @ [place]; [facility] s/p stent    CORONARY ANGIOPLASTY     CORONARY/GRAFT ACUTE MI REVASCULARIZATION N/A 06/06/2021   Procedure: Coronary/Graft Acute MI Revascularization;  Surgeon: Wellington Hampshire, MD;  Location: [place] [facility];  Service: Cardiovascular;  Laterality: N/A;   ELECTROMAGNETIC NAVIGATION BROCHOSCOPY N/A 11/27/2016   Procedure: ELECTROMAGNETIC NAVIGATION BRONCHOSCOPY;  Surgeon: [name], MD;  Location: [facility] ORS;  Service: Cardiopulmonary;  Laterality: N/A;   LEFT HEART CATH AND CORONARY ANGIOGRAPHY N/A 06/06/2021   Procedure: LEFT HEART CATH AND CORONARY ANGIOGRAPHY;  Surgeon: Wellington Hampshire, MD;  Location: [place] [facility];  Service: Cardiovascular;  Laterality: N/A;     reports that he quit smoking about 9 years ago. His smoking use included cigarettes. He smoked an average of 1 pack per day. He has never used smokeless tobacco. He reports that he does not drink alcohol and does not use drugs.  Allergies  Allergen Reactions   Statins Other (See Comments)    Arthralagia    Family History  Problem Relation Age of Onset   Heart attack Mother    Heart attack Father       Prior to Admission medications   Medication Sig Start Date End Date Taking? Authorizing Provider  amiodarone (PACERONE) 200 MG tablet Take 1 tablet (200 mg total) by mouth 2 (two) times daily. Patient taking differently: Take 200 mg by mouth daily. 06/09/21  Yes Fritzi Mandes, MD  apixaban (ELIQUIS) 5 MG TABS tablet Take 1 tablet (5 mg total) by mouth 2 (two) times daily. 06/09/21  Yes Fritzi Mandes, MD  clopidogrel (PLAVIX) 75 MG tablet Take 75 mg by mouth daily. 06/22/21  Yes [provider]  losartan-hydrochlorothiazide (HYZAAR) 50-12.5 MG tablet Take 1 tablet by mouth daily. 10/10/20  Yes [provider]  metoprolol succinate (TOPROL-XL) 50 MG 24 hr  tablet Take 50 mg by mouth daily. 05/30/19  Yes [provider]  torsemide (DEMADEX) 20 MG tablet Take 20 mg by mouth daily. 06/22/21  Yes [provider]  albuterol (VENTOLIN HFA) 108 (90 Base) MCG/ACT inhaler Inhale into the lungs every 6 (six) hours as needed. 01/24/21   [provider]  aspirin 81 MG chewable tablet Chew 1 tablet (81 mg total) by mouth daily. Patient not taking: No sig reported 06/08/21   Sharen Hones, MD  rosuvastatin (CRESTOR) 5 MG tablet Take 2.5 mg by mouth daily. Patient not taking: No sig reported 06/26/21   [provider]    Physical Exam: Vitals:   08/01/21 0630 08/01/21 0730 08/01/21 0900 08/01/21 1000  BP: (!) 122/95 (!) 153/86 109/78 (!) 145/85  Pulse: 86 82 79 78  Resp: 18 19 (!) 22 17  Temp:      TempSrc:      SpO2: 95% 93% 95% 94%  Weight:      Height:         Vitals:   08/01/21 0630 08/01/21  0730 08/01/21 0900 08/01/21 1000  BP: (!) 122/95 (!) 153/86 109/78 (!) 145/85  Pulse: 86 82 79 78  Resp: 18 19 (!) 22 17  Temp:      TempSrc:      SpO2: 95% 93% 95% 94%  Weight:      Height:          Constitutional: Alert and oriented x 3 . Not in any apparent distress.  Chronically ill-appearing HEENT:      Head: Normocephalic and atraumatic.         Eyes: PERLA, EOMI, Conjunctivae are normal. Sclera is non-icteric.       Mouth/Throat: Mucous membranes are moist.       Neck: Supple with no signs of meningismus. Cardiovascular: Regular rate and rhythm. No murmurs, gallops, or rubs. 2+ symmetrical distal pulses are present . No JVD.  Trace LE edema Respiratory: Respiratory effort normal .crackles in both lung bases bilaterally. Scattered wheezes, no rhonchi.  Gastrointestinal: Soft, non tender, and non distended with positive bowel sounds.  Genitourinary: No CVA tenderness. Musculoskeletal: Nontender with normal range of motion in all extremities. No cyanosis, or erythema of extremities. Neurologic:  Face is  symmetric. Moving all extremities. No gross focal neurologic deficits . Skin: Skin is warm, dry.  No rash or ulcers Psychiatric: Mood and affect are normal    Labs on Admission: I have personally reviewed following labs and imaging studies  CBC: Recent Labs  Lab 08/01/21 0445  WBC 15.4*  NEUTROABS 12.6*  HGB 16.2  HCT 47.2  MCV 91.1  PLT 009   Basic Metabolic Panel: Recent Labs  Lab 08/01/21 0445 08/01/21 0735  NA 139 139  K 3.0* 2.9*  CL 96* 93*  CO2 32 30  GLUCOSE 185* 177*  BUN 14 13  CREATININE 1.00 1.01  CALCIUM 9.0 8.4*   GFR: Estimated Creatinine Clearance: 47.3 mL/min (by C-G formula based on SCr of 1.01 mg/dL). Liver Function Tests: No results for input(s): AST, ALT, ALKPHOS, BILITOT, PROT, ALBUMIN in the last 168 hours. No results for input(s): LIPASE, AMYLASE in the last 168 hours. No results for input(s): AMMONIA in the last 168 hours. Coagulation Profile: No results for input(s): INR, PROTIME in the last 168 hours. Cardiac Enzymes: No results for input(s): CKTOTAL, CKMB, CKMBINDEX, TROPONINI in the last 168 hours. BNP (last 3 results) No results for input(s): PROBNP in the last 8760 hours. HbA1C: No results for input(s): HGBA1C in the last 72 hours. CBG: No results for input(s): GLUCAP in the last 168 hours. Lipid Profile: No results for input(s): CHOL, HDL, LDLCALC, TRIG, CHOLHDL, LDLDIRECT in the last 72 hours. Thyroid Function Tests: No results for input(s): TSH, T4TOTAL, FREET4, T3FREE, THYROIDAB in the last 72 hours. Anemia Panel: No results for input(s): VITAMINB12, FOLATE, FERRITIN, TIBC, IRON, RETICCTPCT in the last 72 hours. Urine analysis:    Component Value Date/Time   COLORURINE YELLOW (A) 10/04/2020 1239   APPEARANCEUR CLEAR (A) 10/04/2020 1239   APPEARANCEUR Clear 11/28/2013 0605   LABSPEC 1.003 (L) 10/04/2020 1239   LABSPEC 1.010 11/28/2013 0605   PHURINE 7.0 10/04/2020 1239   GLUCOSEU NEGATIVE 10/04/2020 1239   GLUCOSEU 50  mg/dL 11/28/2013 0605   HGBUR NEGATIVE 10/04/2020 [place] 10/04/2020 1239   BILIRUBINUR Negative 11/28/2013 0605   KETONESUR NEGATIVE 10/04/2020 1239   PROTEINUR NEGATIVE 10/04/2020 1239   NITRITE NEGATIVE 10/04/2020 1239   LEUKOCYTESUR NEGATIVE 10/04/2020 1239   LEUKOCYTESUR Negative 11/28/2013 0605    Radiological Exams  on Admission: CT Angio Chest PE W and/or Wo Contrast  Result Date: 08/01/2021 CLINICAL DATA:  Lung carcinoma post radiation therapy, shortness of breath, cough, high prob PE suspected EXAM: CT ANGIOGRAPHY CHEST WITH CONTRAST TECHNIQUE: Multidetector CT imaging of the chest was performed using the standard protocol during bolus administration of intravenous contrast. Multiplanar CT image reconstructions and MIPs were obtained to evaluate the vascular anatomy. CONTRAST:  81mL OMNIPAQUE IOHEXOL 350 MG/ML SOLN COMPARISON:  PET-CT 02/28/2021 and previous FINDINGS: Cardiovascular: Heart size normal. Small volume pericardial fluid. The RV is nondilated. Satisfactory opacification of pulmonary arteries noted, and there is no evidence of pulmonary emboli. There is narrowing of distal left pulmonary artery with chronic occlusion of medial left upper lobe segmental branches. There is nonopacification of the superior left pulmonary vein. Coronary calcifications. Incomplete contrast opacification of the thoracic aorta. No evidence of aneurysm or proximal dissection. Scattered calcified atheromatous plaque in the isthmus and descending thoracic aorta. Mediastinum/Nodes: No definite mediastinal adenopathy. Confluent medial left upper lobe scarring extending to the hilum with fiducial markers as before. No mediastinal mass. Lungs/Pleura: Chronic left upper lobe scarring and consolidation with fiducial markers. Small bilateral pleural effusions left greater than right, new since previous, without pleural enhancement nodularity or loculation. Pulmonary emphysema. Dependent  atelectasis at the right lung base. 3 nodules in the left lower lobe (Im36, 39, 61,Se6) are new since previous. There is also a cavitary 6 mm nodule in the anterior left lower lobe image 29, new since previous. Upper Abdomen: No acute finding or mass. Musculoskeletal: Mixed lytic/sclerotic lesion in the left side of the T10 vertebral body as previously described. No acute fracture. Review of the MIP images confirms the above findings. IMPRESSION: 1. Negative for acute PE or proximal thoracic aortic dissection. 2. 4 small left pulmonary nodules which were not evident on the prior study suggesting progression of disease. 3. New bilateral pleural effusions, left greater than right. Electronically Signed   By: Lucrezia Europe M.D.   On: 08/01/2021 07:50   DG Chest Portable 1 View  Result Date: 08/01/2021 CLINICAL DATA:  87 year old male with shortness of breath. Wheezing. History of treated non-small cell lung cancer in 2018. EXAM: PORTABLE CHEST 1 VIEW COMPARISON:  Portable chest 06/13/2021 and earlier. FINDINGS: Portable AP upright view at 0425 hours. Skin fold artifact right greater than left. No convincing pneumothorax. Larger lung volumes compared to August. Chronic coarse pulmonary interstitial opacity which is asymmetrically greater on the right. Chronic post treatment changes and gas trapping in the left upper lung and along the left superior mediastinum appears stable. Other mediastinal contours appear stable. No convincing acute pulmonary opacity. Trachea appears stable. No acute osseous abnormality identified. IMPRESSION: Skin fold artifact more pronounced in the right upper lung. Chronic lung disease and chronic post treatment changes to the left upper lobe with no acute cardiopulmonary abnormality. Electronically Signed   By: Genevie Ann M.D.   On: 08/01/2021 05:32     Assessment/Plan Principal Problem:   Acute on chronic combined systolic (congestive) and diastolic (congestive) heart failure (HCC) Active  Problems:   CAD (coronary artery disease)   Hypertension   Cancer of upper lobe of left lung (HCC)   Diabetes mellitus type 2, uncomplicated (HCC)   AF (paroxysmal atrial fibrillation) (HCC)   Acute respiratory failure (HCC)   Hypokalemia      Patient is an 87 year old male who presents to the emergency room for evaluation of worsening shortness of breath from his baseline and is found to  have acute on chronic combined CHF.   Acute on chronic combined systolic and diastolic dysfunction CHF Patient presents for evaluation of sudden onset shortness of breath was from his baseline CT angiogram shows bilateral pleural effusion, left greater than right BNP is elevated and patient's last 2D echocardiogram from 08/22 shows an LVEF of 45 to 50% Will place patient on Lasix 20 mg IV every 12 Continue losartan and metoprolol Maintain low-sodium diet Consult cardiology     Acute respiratory failure (POA) Patient presented to the ER in respiratory distress with increased work of breathing, use of assessor muscles and had room air pulse oximetry of 79% requiring oxygen supplementation to maintain pulse oximetry greater than 92% He is currently on 2 L of oxygen Patient will need to be assessed for home oxygen need prior to discharge     Hypokalemia Probably related to diuretic therapy Supplement potassium Check magnesium levels      Paroxysmal atrial fibrillation Continue amiodarone and metoprolol for rate control Continue apixaban as primary prophylaxis for an acute stroke    History of coronary artery disease Status post stent angioplasty Continue Plavix and metoprolol Patient not on statins due to allergy    History of lung cancer Status postradiation therapy CT angiogram shows new pulmonary nodules suggestive of disease progression We will consult oncology     Diabetes mellitus Not on any medication Check blood sugars twice daily   DVT prophylaxis:  Apixaban Code Status: full code  Family Communication: Greater than 50% of time was spent discussing patient's condition and plan of care with his wife and daughter at the bedside as well as another daughter Investment banker, corporate) over the phone.  All questions and concerns have been addressed to the best of my ability.  CODE STATUS was discussed and patient is a full code.  He lists his wife and daughters as his healthcare power of attorney. Disposition Plan: Back to previous home environment Consults called: Cardiology/oncology Status:At the time of admission, it appears that the appropriate admission status for this patient is inpatient. This is judged to be reasonable and necessary to provide the required intensity of service to ensure the patient's safety given the presenting symptoms, physical exam findings, and initial radiographic and laboratory data in the context of their comorbid conditions. Patient requires inpatient status due to high intensity of service, high risk for further deterioration and high frequency of surveillance required.     Collier Bullock MD [facility]     08/01/2021, 12:19 PM

## 2021-08-01 NOTE — ED Notes (Signed)
Pt ambulated on RA in hallway, pt O2 sat dropped down to mid 80s. Dr. Cheri Fowler made aware. Pt put on 2L [place]. O2 sats now at 96%.

## 2021-08-01 NOTE — ED Provider Notes (Signed)
[facility] Emergency Department Provider Note  ____________________________________________  Time seen: Approximately 4:21 AM  I have reviewed the triage vital signs and the nursing notes.   HISTORY  Chief Complaint Shortness of Breath   HPI Scott Gross is a 87 y.o. male history of CAD status post STEMI in August 2022 on Plavix, COPD, lung cancer status post radiation therapy, diabetes, atrial fibrillation, hypertension, hyperlipidemia, V. fib who presents for evaluation of shortness of breath.  Patient reports several days of progressively worsening shortness of breath, dry cough and wheezing.  Has been using his inhaler at home.  This evening his symptoms became severe.  Denies chest pain, fever, personal or family history of PE or DVT, recent travel immobilization, leg pain or swelling, hemoptysis or exogenous hormone use.  When EMS arrived to the house, patient was satting 79%.  Received 2 breathing treatments in route with full resolution of his wheezing and hypoxia.   Past Medical History:  Diagnosis Date   Arthritis    CAD (coronary artery disease) Nov. 12, 2012   Inferior ST elevation MI in November of 2012 with ventricular fibrillation arrest. Status post drug-eluting stent placement to the mid LAD. Most recent cardiac catheterization in June of this year showed patent stent with minimal restenosis, 75% proximal LAD and 80% distal LAD which were unchanged from before. Mild disease in the left circumflex and moderate RCA disease. Ejection fraction was 55%.   Cancer of upper lobe of left lung ([place]) 11/2016   Rad tx's.   Cardiac arrest [facility]) Nov. 2012   x 2    Cardiac arrest - ventricular fibrillation    HOH (hard of hearing)    Left Hearing Aid   Hyperlipidemia    Hypertension    Post PTCA 2013   x2   ST elevation MI (STEMI) ([place]) 08/26/2011    Patient Active Problem List   Diagnosis Date Noted   A-fib ([place]) 06/09/2021   Atrial  fibrillation with rapid ventricular response ([place]) 06/08/2021   Acute ST elevation myocardial infarction (STEMI) of lateral wall ([place]) 06/06/2021   Chronic anticoagulation 06/06/2021   Protein-calorie malnutrition, severe 06/06/2021   Malnutrition of moderate degree 06/22/2019   CAP (community acquired pneumonia) 06/21/2019   SIRS (systemic inflammatory response syndrome) ([place]) 05/23/2019   Atrial fibrillation with RVR (HCC)    PAF (paroxysmal atrial fibrillation) ([place]) 05/20/2019   Cardiac arrest ([place])    Diabetes mellitus type 2, uncomplicated ([place]) 20/25/4270   Angina pectoris ([place]) 12/17/2016   Adenopathy    Cancer of upper lobe of left lung ([place]) 09/10/2016   Hyperlipidemia    Hypertension    CAD (coronary artery disease) 08/27/2011   Myocardial infarction ([place]) 08/16/2011    Past Surgical History:  Procedure Laterality Date   CARDIAC CATHETERIZATION  2013   @ [place]; [facility] s/p stent    CORONARY ANGIOPLASTY     CORONARY/GRAFT ACUTE MI REVASCULARIZATION N/A 06/06/2021   Procedure: Coronary/Graft Acute MI Revascularization;  Surgeon: Wellington Hampshire, MD;  Location: [facility];  Service: Cardiovascular;  Laterality: N/A;   ELECTROMAGNETIC NAVIGATION BROCHOSCOPY N/A 11/27/2016   Procedure: ELECTROMAGNETIC NAVIGATION BRONCHOSCOPY;  Surgeon: [name], MD;  Location: [facility] ORS;  Service: Cardiopulmonary;  Laterality: N/A;   LEFT HEART CATH AND CORONARY ANGIOGRAPHY N/A 06/06/2021   Procedure: LEFT HEART CATH AND CORONARY ANGIOGRAPHY;  Surgeon: Wellington Hampshire, MD;  Location: [facility];  Service: Cardiovascular;  Laterality: N/A;    Prior to  Admission medications   Medication Sig Start Date End Date Taking? Authorizing Provider  albuterol (VENTOLIN HFA) 108 (90 Base) MCG/ACT inhaler Inhale into the lungs every 6 (six) hours as needed. 01/24/21   [provider]  amiodarone (PACERONE) 200 MG tablet Take 1 tablet (200 mg total) by mouth 2 (two) times  daily. 06/09/21   Fritzi Mandes, MD  apixaban (ELIQUIS) 5 MG TABS tablet Take 1 tablet (5 mg total) by mouth 2 (two) times daily. 06/09/21   Fritzi Mandes, MD  aspirin 81 MG chewable tablet Chew 1 tablet (81 mg total) by mouth daily. 06/08/21   Sharen Hones, MD  losartan-hydrochlorothiazide (HYZAAR) 50-12.5 MG tablet Take 1 tablet by mouth daily. 10/10/20   [provider]  metoprolol succinate (TOPROL-XL) 50 MG 24 hr tablet Take 50 mg by mouth daily. 05/30/19   [provider]    Allergies Statins  Family History  Problem Relation Age of Onset   Heart attack Mother    Heart attack Father     Social History Social History   Tobacco Use   Smoking status: Former    Packs/day: 1.00    Types: Cigarettes    Quit date: 08/27/2011    Years since quitting: 9.9   Smokeless tobacco: Never  Vaping Use   Vaping Use: Never used  Substance Use Topics   Alcohol use: No   Drug use: No    Review of Systems  Constitutional: Negative for fever. Eyes: Negative for visual changes. ENT: Negative for sore throat. Neck: No neck pain  Cardiovascular: Negative for chest pain. Respiratory: + shortness of breath, wheezing, cough Gastrointestinal: Negative for abdominal pain, vomiting or diarrhea. Genitourinary: Negative for dysuria. Musculoskeletal: Negative for back pain. Skin: Negative for rash. Neurological: Negative for headaches, weakness or numbness. Psych: No SI or HI  ____________________________________________   PHYSICAL EXAM:  VITAL SIGNS: ED Triage Vitals [08/01/21 0416]  Enc Vitals Group     BP (!) 191/108     Pulse Rate 85     Resp (!) 22     Temp (!) 97.5 F (36.4 C)     Temp Source Oral     SpO2 93 %     Weight      Height      Head Circumference      Peak Flow      Pain Score      Pain Loc      Pain Edu?      Excl. in [place]?     Constitutional: Alert and oriented. Well appearing and in no apparent distress. HEENT:      Head: Normocephalic and  atraumatic.         Eyes: Conjunctivae are normal. Sclera is non-icteric.       Mouth/Throat: Mucous membranes are moist.       Neck: Supple with no signs of meningismus. Cardiovascular: Regular rate and rhythm. No murmurs, gallops, or rubs. 2+ symmetrical distal pulses are present in all extremities. No JVD. Respiratory: Patient with diffuse wheezing bilaterally, tachypneic, satting 89 to 90% on room air  gastrointestinal: Soft, non tender, and non distended with positive bowel sounds. No rebound or guarding. Genitourinary: No CVA tenderness. Musculoskeletal:  No edema, cyanosis, or erythema of extremities. Neurologic: Normal speech and language. Face is symmetric. Moving all extremities. No gross focal neurologic deficits are appreciated. Skin: Skin is warm, dry and intact. No rash noted. Psychiatric: Mood and affect are normal. Speech and behavior are normal.  ____________________________________________  LABS (all labs ordered are listed, but only abnormal results are displayed)  Labs Reviewed  BRAIN NATRIURETIC PEPTIDE - Abnormal; Notable for the following components:      Result Value   B Natriuretic Peptide 1,047.2 (*)    All other components within normal limits  CBC WITH DIFFERENTIAL/PLATELET - Abnormal; Notable for the following components:   WBC 15.4 (*)    Neutro Abs 12.6 (*)    Abs Immature Granulocytes 0.08 (*)    All other components within normal limits  BASIC METABOLIC PANEL - Abnormal; Notable for the following components:   Potassium 3.0 (*)    Chloride 96 (*)    Glucose, Bld 185 (*)    All other components within normal limits  TROPONIN I (HIGH SENSITIVITY) - Abnormal; Notable for the following components:   Troponin I (High Sensitivity) 28 (*)    All other components within normal limits  RESP PANEL BY RT-PCR (FLU A&B, COVID) ARPGX2  TROPONIN I (HIGH SENSITIVITY)   ____________________________________________  EKG  ED ECG REPORT I, Rudene Re,  the attending physician, personally viewed and interpreted this ECG.  Sinus rhythm with a rate of 79, prolonged QTC of 508, no ST elevations or depressions ____________________________________________  RADIOLOGY  I have personally reviewed the images performed during this visit and I agree with the Radiologist's read.   Interpretation by Radiologist:  DG Chest Portable 1 View  Result Date: 08/01/2021 CLINICAL DATA:  87 year old male with shortness of breath. Wheezing. History of treated non-small cell lung cancer in 2018. EXAM: PORTABLE CHEST 1 VIEW COMPARISON:  Portable chest 06/13/2021 and earlier. FINDINGS: Portable AP upright view at 0425 hours. Skin fold artifact right greater than left. No convincing pneumothorax. Larger lung volumes compared to August. Chronic coarse pulmonary interstitial opacity which is asymmetrically greater on the right. Chronic post treatment changes and gas trapping in the left upper lung and along the left superior mediastinum appears stable. Other mediastinal contours appear stable. No convincing acute pulmonary opacity. Trachea appears stable. No acute osseous abnormality identified. IMPRESSION: Skin fold artifact more pronounced in the right upper lung. Chronic lung disease and chronic post treatment changes to the left upper lobe with no acute cardiopulmonary abnormality. Electronically Signed   By: Genevie Ann M.D.   On: 08/01/2021 05:32     ____________________________________________   PROCEDURES  Procedure(s) performed:yes  .1-3 Lead EKG Interpretation Performed by: Rudene Re, MD Authorized by: Rudene Re, MD     Interpretation: non-specific     ECG rate assessment: normal     Rhythm: sinus rhythm     Ectopy: none     Conduction: abnormal     Critical Care performed: yes  CRITICAL CARE Performed by: Rudene Re  ?  Total critical care time: 30 min  Critical care time was exclusive of separately billable procedures  and treating other patients.  Critical care was necessary to treat or prevent imminent or life-threatening deterioration.  Critical care was time spent personally by me on the following activities: development of treatment plan with patient and/or surrogate as well as nursing, discussions with consultants, evaluation of patient's response to treatment, examination of patient, obtaining history from patient or surrogate, ordering and performing treatments and interventions, ordering and review of laboratory studies, ordering and review of radiographic studies, pulse oximetry and re-evaluation of patient's condition.  ____________________________________________   INITIAL IMPRESSION / ASSESSMENT AND PLAN / ED COURSE  87 y.o. male history of CAD status post STEMI in August 2022 on Plavix,  COPD, lung cancer status post radiation therapy, diabetes, atrial fibrillation, hypertension, hyperlipidemia, V. fib who presents for evaluation of shortness of breath, wheezing and cough x3 days.  Patient arrives in mild respiratory distress, tachypneic, diffuse generalized wheezing, satting 89 to 90% on room air.  He looks euvolemic.  No asymmetric leg swelling.  EKG with no signs of dysrhythmias or ischemia.  Ddx COPD exacerbation versus bronchitis versus pneumonia versus COVID versus flu versus CHF versus PE versus myocarditis versus pericarditis.  Will give 3 DuoNeb's and Solu-Medrol.  We will get labs, chest x-ray, COVID/flu swab.  Patient placed on telemetry for close monitoring of cardiorespiratory status.  Old medical records reviewed.  _________________________ 6:39 AM on 08/01/2021 ----------------------------------------- After DuoNeb's and Solu-Medrol patient feels markedly improved with normal work of breathing, no longer wheezing.  We were able to take him off of oxygen with sats between 96 and 100%.  Chest x-ray showing chronic changes with no acute findings.  BNP is elevated at 1047.  Last echo  from August 2022 showing EF of 45 to 50%.  According to the daughter who is at bedside patient is on torsemide as needed and has only taken 1 dose within the last week.  His initial troponin slightly elevated at 28, he does have a white count of 15 and mild hypokalemia.  K supplemented p.o. and IV.  We will give a dose of IV Lasix to help with diuresis.  COVID and flu are pending.  This patient has a history of lung cancer we will do a CT of the chest to rule out PE.  Second troponin is due at 645.  If CT is negative, COVID and flu are negative, and second troponin is stable and patient remains with normal work of breathing and normal sats both at rest and ambulation we will plan to discharge home since patient has a scheduled PET scan for tomorrow morning.  Otherwise anticipate admission.  Care transferred to incoming MD at 7 AM      _____________________________________________ Please note:  Patient was evaluated in Emergency Department today for the symptoms described in the history of present illness. Patient was evaluated in the context of the global COVID-19 pandemic, which necessitated consideration that the patient might be at risk for infection with the SARS-CoV-2 virus that causes COVID-19. Institutional protocols and algorithms that pertain to the evaluation of patients at risk for COVID-19 are in a state of rapid change based on information released by regulatory bodies including the CDC and federal and state organizations. These policies and algorithms were followed during the patient's care in the ED.  Some ED evaluations and interventions may be delayed as a result of limited staffing during the pandemic.   [place] Controlled Substance Database was reviewed by me. ____________________________________________   FINAL CLINICAL IMPRESSION(S) / ED DIAGNOSES   Final diagnoses:  Respiratory distress  COPD exacerbation (HCC)  Acute on chronic congestive heart failure, unspecified heart failure  type ([place])      NEW MEDICATIONS STARTED DURING THIS VISIT:  ED Discharge Orders     None        Note:  This document was prepared using Dragon voice recognition software and may include unintentional dictation errors.    Rudene Re, MD 08/01/21 [phone]

## 2021-08-01 NOTE — TOC Initial Note (Signed)
Transition of Care [facility]) - Initial/Assessment Note    Patient Details  Name: Scott Gross MRN: [id number] Date of Birth: 12-05-1933  Transition of Care [facility]) CM/SW Contact:    Ova Freshwater Phone Number: [phone] 08/01/2021, 12:48 PM  Clinical Narrative:                  Patient presents to [facility] ED from home due to SOB for several days.  Patient used a rescue inhaler at home with no relief. Patient has hx history of CAD status post STEMI in August 2022 on Plavix, COPD, lung cancer status post radiation therapy, diabetes, atrial fibrillation, hypertension, hyperlipidemia, V. Fib. Patient hs hx of lung cancer with mets to the bone. Last radiation treatment in August, patient is not a candidate for chemotherapy.  CSW spoke with the patient's spouse Raynard, Mapps Hassan Rowan) (Spouse) [phone] [facility]). Ms. Kimbrell stated the patient is independent with ADLs, patient does not use DME for ambulation, and does not use O2 at home.  Ms Meeker stated the patient does not want SNF placement or home health regardless of PT/OT recommendations. TOC will continue to follow.  Expected Discharge Plan: [place] Barriers to Discharge: Continued Medical Work up   Patient Goals and CMS Choice        Expected Discharge Plan and Services Expected Discharge Plan: [place] In-house Referral: Clinical Social Work     Living arrangements for the past 2 months: [place]                                      Prior Living Arrangements/Services Living arrangements for the past 2 months: Single Family Home Lives with:: Spouse Lannie, Yusuf Hassan Rowan) (Spouse)   [phone] (Mobile)) Patient language and need for interpreter reviewed:: Yes        Need for Family Participation in Patient Care: Yes (Comment) Care giver support system in place?: Yes (comment)   Criminal Activity/Legal Involvement Pertinent to Current Situation/Hospitalization:  No - Comment as needed  Activities of Daily Living      Permission Sought/Granted   Permission granted to share information with : Yes, Verbal Permission Granted  Share Information with NAME: Charlotte, Brafford Hassan Rowan) (Spouse)   [phone] (Mobile)     Permission granted to share info w Relationship: Asencion Gowda [phone] and Leane Call [phone], daughters     Emotional Assessment Appearance:: Appears stated age Attitude/Demeanor/Rapport: Engaged Affect (typically observed): Stable Orientation: : Oriented to Self, Oriented to Place, Oriented to  Time, Oriented to Situation Alcohol / Substance Use: Not Applicable Psych Involvement: No (comment)  Admission diagnosis:  Acute on chronic combined systolic (congestive) and diastolic (congestive) heart failure (HCC) [I50.43] Patient Active Problem List   Diagnosis Date Noted   Acute on chronic combined systolic (congestive) and diastolic (congestive) heart failure ([place]) 08/01/2021   Acute respiratory failure ([place]) 08/01/2021   Hypokalemia 08/01/2021   Pulmonary nodules 08/01/2021   A-fib ([place]) 06/09/2021   Atrial fibrillation with rapid ventricular response ([place]) 06/08/2021   Acute ST elevation myocardial infarction (STEMI) of lateral wall ([place]) 06/06/2021   Chronic anticoagulation 06/06/2021   Protein-calorie malnutrition, severe 06/06/2021   Malnutrition of moderate degree 06/22/2019   CAP (community acquired pneumonia) 06/21/2019   SIRS (systemic inflammatory response syndrome) ([place]) 05/23/2019   Atrial fibrillation with RVR (HCC)    AF (paroxysmal atrial fibrillation) ([place]) 05/20/2019  Cardiac arrest [facility])    Diabetes mellitus type 2, uncomplicated ([place]) 69/50/7225   Angina pectoris ([place]) 12/17/2016   Adenopathy    Cancer of upper lobe of left lung ([place]) 09/10/2016   Hyperlipidemia    Hypertension    CAD (coronary artery disease) 08/27/2011   Myocardial infarction ([place]) 08/16/2011   PCP:  [name], MD Pharmacy:   [facility], [address] [place] [address] Phone: [phone] Fax: [place], [place] [address] Phone: [phone] Fax: [id number] [place], [place] - [address] [place] [address] Phone: [phone] Fax: [phone]  [facility] [place], [place] [address] Phone: [phone] Fax: [phone]  [id number] [place], [place] - [address] [place] [address] Phone: [phone] Fax: [phone]     Social Determinants of Health (SDOH) Interventions    Readmission Risk Interventions No flowsheet data found.

## 2021-08-01 NOTE — ED Triage Notes (Signed)
Patient BIB [facility] for shortness of breath for  several days. Patient using rescue inhaler at home with no relief. EMS gave duoneb en route, patient audibly wheezing in room, MD at bedside to evaluate. Patient able to speak in full sentences, handle all secretions, in NAD. Patient AxOx4.

## 2021-08-02 ENCOUNTER — Ambulatory Visit: Payer: Medicare HMO

## 2021-08-02 DIAGNOSIS — I2510 Atherosclerotic heart disease of native coronary artery without angina pectoris: Secondary | ICD-10-CM

## 2021-08-02 DIAGNOSIS — I5043 Acute on chronic combined systolic (congestive) and diastolic (congestive) heart failure: Secondary | ICD-10-CM

## 2021-08-02 DIAGNOSIS — J90 Pleural effusion, not elsewhere classified: Secondary | ICD-10-CM

## 2021-08-02 DIAGNOSIS — I480 Paroxysmal atrial fibrillation: Secondary | ICD-10-CM

## 2021-08-02 LAB — BASIC METABOLIC PANEL
Anion gap: 10 (ref 5–15)
BUN: 20 mg/dL (ref 8–23)
CO2: 28 mmol/L (ref 22–32)
Calcium: 8.5 mg/dL — ABNORMAL LOW (ref 8.9–10.3)
Chloride: 96 mmol/L — ABNORMAL LOW (ref 98–111)
Creatinine, Ser: 1.1 mg/dL (ref 0.61–1.24)
GFR, Estimated: >60 mL/min (ref >60)
Glucose, Bld: 159 mg/dL — ABNORMAL HIGH (ref 70–99)
Potassium: 4 mmol/L (ref 3.5–5.1)
Sodium: 134 mmol/L — ABNORMAL LOW (ref 135–145)

## 2021-08-02 LAB — CBG MONITORING, ED
Glucose-Capillary: 164 mg/dL — ABNORMAL HIGH (ref 70–99)
Glucose-Capillary: 111 mg/dL — ABNORMAL HIGH (ref 70–99)

## 2021-08-02 MED ORDER — CLOPIDOGREL BISULFATE 75 MG PO TABS
75.0000 mg | ORAL_TABLET | Freq: Every day | ORAL | Status: DC
  Administered 2021-08-03: 75 mg via ORAL
  Filled 2021-08-02: qty 1

## 2021-08-02 MED ORDER — INSULIN ASPART 100 UNIT/ML IJ SOLN
0.0000 [IU] | Freq: Three times a day (TID) | INTRAMUSCULAR | Status: DC
  Administered 2021-08-02: 2 [IU] via SUBCUTANEOUS
  Filled 2021-08-02: qty 1

## 2021-08-02 MED ORDER — INSULIN ASPART 100 UNIT/ML IJ SOLN: 0.0000 [IU] | Freq: Every day | INTRAMUSCULAR | Status: DC

## 2021-08-02 MED ORDER — APIXABAN 5 MG PO TABS
5.0000 mg | ORAL_TABLET | Freq: Two times a day (BID) | ORAL | Status: DC
  Administered 2021-08-02 – 2021-08-03 (×2): 5 mg via ORAL
  Filled 2021-08-02 (×2): qty 1

## 2021-08-02 NOTE — ED Notes (Signed)
Patient's wife to nursing station.  Reports "I think his breathing is getting worse."  Wheezing noted on assessment.  Patient given PRN neb treatment

## 2021-08-02 NOTE — Consult Note (Signed)
[place]  [phone] [phone]  ID: ROQUE SCHILL OB: 09-12-1934  MR#: [id number]  DDU#:[id number]  Patient Care Team: [name], MD as PCP - General (Family Medicine) [name], MD as Consulting Physician (Oncology)  CHIEF COMPLAINT: History of stage IV adenocarcinoma of the left lung, now admitted with increasing shortness of breath likely secondary to CHF exacerbation.  INTERVAL HISTORY: Patient is an 87 year old male with a history of stage IV adenocarcinoma of the lung who has never received chemotherapy treatment, but has had multiple palliative treatments with XRT.  Most recently in June 2022 where he received SBRT to his T10 vertebral body.  Currently, he continues to have shortness of breath but this is improved.  He has no neurological plaints.  He denies any recent fevers.  He has chronic weakness and fatigue.  He has a good appetite and denies weight loss.  He has no chest pain, cough, or hemoptysis.  He denies any nausea, vomiting, constipation, or diarrhea.  He has no urinary complaints.  Patient offers no further specific complaints today.  REVIEW OF SYSTEMS:   Review of Systems  Constitutional:  Positive for malaise/fatigue. Negative for fever and weight loss.  Respiratory:  Positive for shortness of breath. Negative for cough and hemoptysis.   Cardiovascular: Negative.  Negative for chest pain and leg swelling.  Gastrointestinal: Negative.  Negative for abdominal pain.  Genitourinary: Negative.  Negative for dysuria.  Musculoskeletal: Negative.  Negative for back pain.  Skin: Negative.  Negative for rash.  Neurological:  Positive for weakness. Negative for dizziness, focal weakness and headaches.  Psychiatric/Behavioral: Negative.  The patient is not nervous/anxious.    As per HPI. Otherwise, a complete review of systems is negative.  PAST MEDICAL HISTORY: Past Medical History:  Diagnosis Date   Arthritis     CAD (coronary artery disease) Nov. 12, 2012   Inferior ST elevation MI in November of 2012 with ventricular fibrillation arrest. Status post drug-eluting stent placement to the mid LAD. Most recent cardiac catheterization in June of this year showed patent stent with minimal restenosis, 75% proximal LAD and 80% distal LAD which were unchanged from before. Mild disease in the left circumflex and moderate RCA disease. Ejection fraction was 55%.   Cancer of upper lobe of left lung ([place]) 11/2016   Rad tx's.   Cardiac arrest [facility]) Nov. 2012   x 2    Cardiac arrest - ventricular fibrillation    HOH (hard of hearing)    Left Hearing Aid   Hyperlipidemia    Hypertension    Post PTCA 2013   x2   ST elevation MI (STEMI) ([place]) 08/26/2011    PAST SURGICAL HISTORY: Past Surgical History:  Procedure Laterality Date   CARDIAC CATHETERIZATION  2013   @ [place]; [place] s/p stent    CORONARY ANGIOPLASTY     CORONARY/GRAFT ACUTE MI REVASCULARIZATION N/A 06/06/2021   Procedure: Coronary/Graft Acute MI Revascularization;  Surgeon: Wellington Hampshire, MD;  Location: [facility];  Service: Cardiovascular;  Laterality: N/A;   ELECTROMAGNETIC NAVIGATION BROCHOSCOPY N/A 11/27/2016   Procedure: ELECTROMAGNETIC NAVIGATION BRONCHOSCOPY;  Surgeon: [name], MD;  Location: [facility];  Service: Cardiopulmonary;  Laterality: N/A;   LEFT HEART CATH AND CORONARY ANGIOGRAPHY N/A 06/06/2021   Procedure: LEFT HEART CATH AND CORONARY ANGIOGRAPHY;  Surgeon: Wellington Hampshire, MD;  Location: [facility];  Service: Cardiovascular;  Laterality: N/A;    FAMILY HISTORY: Family History  Problem Relation Age  of Onset   Heart attack Mother    Heart attack Father     ADVANCED DIRECTIVES (Y/N):  @ADVDIR @  HEALTH MAINTENANCE: Social History   Tobacco Use   Smoking status: Former    Packs/day: 1.00    Types: Cigarettes    Quit date: 08/27/2011    Years since quitting: 9.9   Smokeless tobacco: Never   Vaping Use   Vaping Use: Never used  Substance Use Topics   Alcohol use: No   Drug use: No     Colonoscopy:  PAP:  Bone density:  Lipid panel:  Allergies  Allergen Reactions   Statins Other (See Comments)    Arthralagia    Current Facility-Administered Medications  Medication Dose Route Frequency Provider Last Rate Last Admin   0.9 %  sodium chloride infusion  250 mL Intravenous PRN Agbata, Tochukwu, MD       acetaminophen (TYLENOL) tablet 650 mg  650 mg Oral Q4H PRN Agbata, Tochukwu, MD       amiodarone (PACERONE) tablet 200 mg  200 mg Oral Daily Agbata, Tochukwu, MD   200 mg at 08/02/21 1034   apixaban (ELIQUIS) tablet 5 mg  5 mg Oral BID Agbata, Tochukwu, MD   5 mg at 08/02/21 1033   clopidogrel (PLAVIX) tablet 75 mg  75 mg Oral Daily Agbata, Tochukwu, MD   75 mg at 08/02/21 1033   furosemide (LASIX) injection 20 mg  20 mg Intravenous BID Agbata, Tochukwu, MD   20 mg at 08/02/21 1032   ipratropium-albuterol (DUONEB) 0.5-2.5 (3) MG/3ML nebulizer solution 3 mL  3 mL Nebulization Q6H PRN Agbata, Tochukwu, MD   3 mL at 08/02/21 0120   losartan (COZAAR) tablet 50 mg  50 mg Oral Daily Agbata, Tochukwu, MD   50 mg at 08/02/21 1032   metoprolol succinate (TOPROL-XL) 24 hr tablet 50 mg  50 mg Oral Daily Agbata, Tochukwu, MD   50 mg at 08/02/21 1033   ondansetron (ZOFRAN) injection 4 mg  4 mg Intravenous Q6H PRN Agbata, Tochukwu, MD       potassium chloride SA (KLOR-CON) CR tablet 40 mEq  40 mEq Oral Daily Agbata, Tochukwu, MD   40 mEq at 08/02/21 1033   sodium chloride flush (NS) 0.9 % injection 3 mL  3 mL Intravenous Q12H Agbata, Tochukwu, MD   3 mL at 08/01/21 2103   sodium chloride flush (NS) 0.9 % injection 3 mL  3 mL Intravenous PRN Agbata, Tochukwu, MD   3 mL at 08/01/21 2040   Current Outpatient Medications  Medication Sig Dispense Refill   amiodarone (PACERONE) 200 MG tablet Take 1 tablet (200 mg total) by mouth 2 (two) times daily. (Patient taking differently: Take 200 mg by  mouth daily.) 60 tablet 0   apixaban (ELIQUIS) 5 MG TABS tablet Take 1 tablet (5 mg total) by mouth 2 (two) times daily. 60 tablet 1   clopidogrel (PLAVIX) 75 MG tablet Take 75 mg by mouth daily.     losartan-hydrochlorothiazide (HYZAAR) 50-12.5 MG tablet Take 1 tablet by mouth daily.     metoprolol succinate (TOPROL-XL) 50 MG 24 hr tablet Take 50 mg by mouth daily.     torsemide (DEMADEX) 20 MG tablet Take 20 mg by mouth daily.     albuterol (VENTOLIN HFA) 108 (90 Base) MCG/ACT inhaler Inhale into the lungs every 6 (six) hours as needed.     aspirin 81 MG chewable tablet Chew 1 tablet (81 mg total) by mouth daily. (Patient not taking:  No sig reported) 30 tablet 0   rosuvastatin (CRESTOR) 5 MG tablet Take 2.5 mg by mouth daily. (Patient not taking: No sig reported)      OBJECTIVE: Vitals:   08/02/21 1200 08/02/21 1400  BP: 127/73 134/83  Pulse: 67 68  Resp: 19 19  Temp:    SpO2: (!) 88% 93%     Body mass index is 19.25 kg/m.    ECOG FS:1 - Symptomatic but completely ambulatory  General: Thin, no acute distress. Eyes: Pink conjunctiva, anicteric sclera. HEENT: Normocephalic, moist mucous membranes. Lungs: No audible wheezing or coughing. Heart: Regular rate and rhythm. Abdomen: Soft, nontender, no obvious distention. Musculoskeletal: No edema, cyanosis, or clubbing. Neuro: Alert, answering all questions appropriately. Cranial nerves grossly intact. Skin: No rashes or petechiae noted. Psych: Normal affect. Lymphatics: No cervical, calvicular, axillary or inguinal LAD.   LAB RESULTS:  Lab Results  Component Value Date   NA 134 (L) 08/02/2021   K 4.0 08/02/2021   CL 96 (L) 08/02/2021   CO2 28 08/02/2021   GLUCOSE 159 (H) 08/02/2021   BUN 20 08/02/2021   CREATININE 1.10 08/02/2021   CALCIUM 8.5 (L) 08/02/2021   PROT 6.4 (L) 06/13/2021   ALBUMIN 3.6 06/13/2021   AST 22 06/13/2021   ALT 18 06/13/2021   ALKPHOS 83 06/13/2021   BILITOT 0.9 06/13/2021   GFRNONAA >60  08/02/2021   GFRAA >60 03/15/2020    Lab Results  Component Value Date   WBC 15.4 (H) 08/01/2021   NEUTROABS 12.6 (H) 08/01/2021   HGB 16.2 08/01/2021   HCT 47.2 08/01/2021   MCV 91.1 08/01/2021   PLT 195 08/01/2021     STUDIES: CT Angio Chest PE W and/or Wo Contrast  Result Date: 08/01/2021 CLINICAL DATA:  Lung carcinoma post radiation therapy, shortness of breath, cough, high prob PE suspected EXAM: CT ANGIOGRAPHY CHEST WITH CONTRAST TECHNIQUE: Multidetector CT imaging of the chest was performed using the standard protocol during bolus administration of intravenous contrast. Multiplanar CT image reconstructions and MIPs were obtained to evaluate the vascular anatomy. CONTRAST:  72mL OMNIPAQUE IOHEXOL 350 MG/ML SOLN COMPARISON:  PET-CT 02/28/2021 and previous FINDINGS: Cardiovascular: Heart size normal. Small volume pericardial fluid. The RV is nondilated. Satisfactory opacification of pulmonary arteries noted, and there is no evidence of pulmonary emboli. There is narrowing of distal left pulmonary artery with chronic occlusion of medial left upper lobe segmental branches. There is nonopacification of the superior left pulmonary vein. Coronary calcifications. Incomplete contrast opacification of the thoracic aorta. No evidence of aneurysm or proximal dissection. Scattered calcified atheromatous plaque in the isthmus and descending thoracic aorta. Mediastinum/Nodes: No definite mediastinal adenopathy. Confluent medial left upper lobe scarring extending to the hilum with fiducial markers as before. No mediastinal mass. Lungs/Pleura: Chronic left upper lobe scarring and consolidation with fiducial markers. Small bilateral pleural effusions left greater than right, new since previous, without pleural enhancement nodularity or loculation. Pulmonary emphysema. Dependent atelectasis at the right lung base. 3 nodules in the left lower lobe (Im36, 39, 61,Se6) are new since previous. There is also a  cavitary 6 mm nodule in the anterior left lower lobe image 29, new since previous. Upper Abdomen: No acute finding or mass. Musculoskeletal: Mixed lytic/sclerotic lesion in the left side of the T10 vertebral body as previously described. No acute fracture. Review of the MIP images confirms the above findings. IMPRESSION: 1. Negative for acute PE or proximal thoracic aortic dissection. 2. 4 small left pulmonary nodules which were not evident on  the prior study suggesting progression of disease. 3. New bilateral pleural effusions, left greater than right. Electronically Signed   By: Lucrezia Europe M.D.   On: 08/01/2021 07:50   DG Chest Portable 1 View  Result Date: 08/01/2021 CLINICAL DATA:  87 year old male with shortness of breath. Wheezing. History of treated non-small cell lung cancer in 2018. EXAM: PORTABLE CHEST 1 VIEW COMPARISON:  Portable chest 06/13/2021 and earlier. FINDINGS: Portable AP upright view at 0425 hours. Skin fold artifact right greater than left. No convincing pneumothorax. Larger lung volumes compared to August. Chronic coarse pulmonary interstitial opacity which is asymmetrically greater on the right. Chronic post treatment changes and gas trapping in the left upper lung and along the left superior mediastinum appears stable. Other mediastinal contours appear stable. No convincing acute pulmonary opacity. Trachea appears stable. No acute osseous abnormality identified. IMPRESSION: Skin fold artifact more pronounced in the right upper lung. Chronic lung disease and chronic post treatment changes to the left upper lobe with no acute cardiopulmonary abnormality. Electronically Signed   By: Genevie Ann M.D.   On: 08/01/2021 05:32    ASSESSMENT: History of stage IV adenocarcinoma of the left lung, now admitted with increasing shortness of breath.  PLAN:    1.  History of stage IV adenocarcinoma left lung: Patient initially diagnosed in February 2018 and has had multiple treatments with palliative  XRT since that time, but never has actually received systemic chemotherapy.  Recent CT scan revealed 4 small left sided pulmonary nodules that were not evident on prior study.  Given their size, it is unlikely these will be FDG avid on PET scan.  Patient was initially scheduled for restaging PET scan this week prior to admission.  No intervention is needed from an oncology standpoint.  Once patient is discharged, we will reschedule PET scan in the next 1 to 2 weeks with follow-up in the cancer center several days later. 2.  Shortness of breath: Likely secondary to increasing pleural effusions.  Appreciate cardiology input.  Continue Lasix and furosemide as per their instructions. 3.  Leukocytosis: Likely reactive, monitor.  Appreciate consult, call with questions.   [name], MD   08/02/2021 4:17 PM

## 2021-08-02 NOTE — Consult Note (Signed)
   Heart Failure Nurse Navigator Note  HFmrEF 45-50%.left ventricle reveals regional wall motion abnormalities.  Normal right ventricular systolic function.  He presented to the emergency room with worsening shortness of breath, dry cough and a wheeze.  He states that his breathing had been gradually getting worse for the last 2 weeks.  Comorbidities:  Coronary artery disease/stent placement Diabetes Paroxysmal A. Fib  History of lung cancer, status post radiation  Medications:  Potassium chloride 40 mEq daily Losartan 50 mg daily Furosemide 20 mg twice daily Amiodarone 200 mg daily Metoprolol succinate 50 mg daily Eliquis 5 mg twice a day Plavix 75 mg daily  Labs:  Sodium 134, potassium 4, chloride 96, CO2 28, BUN 20, creatinine 1.1, BNP on admission was 1047 Chest CT revealed bilateral pleural effusions right greater than left.  Initial meeting with patient, his daughter nor his wife for at the bedside.  He states at home that he had noted shortness of breath gradually worsening any over the last 2 weeks with the shortness of breath waking him from sleep, he attempted to move to the recliner which helped a little but still felt that he needed to come to the emergency room.  He states that he has a scale but he does not weigh himself on a daily basis.  Discussed daily weights and what to report to his physicians.  He states at home besides the scale he has a blood pressure cuff and wrote for monitoring his O2 saturations.  States that he and his wife are very active taking care of their home in their yard, he also lives across the street from school and walks on their track 3 times a week without any difficulty.   Does not use salt at the table has not for the last 40 years and also he and his wife make sure that they are eating low-sodium foods.  He states that he takes his medications as ordered and uses his water pill on an as-needed basis which seems to be about once a  week.  Discussed follow-up in the outpatient heart failure clinic has an appointment with Darylene Price at 930 on October 31.  He has a 4% rate of no-show, 4 out of 132 appointments.   Pricilla Riffle RN CHFN

## 2021-08-02 NOTE — Progress Notes (Signed)
[facility] Encounter Note  Patient: DRELYN PISTILLI / Admit Date: 08/01/2021 / Date of Encounter: 08/02/2021, 1:58 PM   Subjective: Overall patient is breathing better than yesterday and feeling much more comfortable.  No evidence of myocardial infarction with a peak troponin of 33.  Chest x-ray and CT scan suggest no evidence of significant pulmonary edema although there is pleural effusions contributing to his shortness of breath and hypoxia.  He did receive intravenous Lasix with some minimal urine output.  EKG [name] remains in normal rhythm and no evidence of significant concerns  Review of Systems: Positive for: Shortness of breath Negative for: Vision change, hearing change, syncope, dizziness, nausea, vomiting,diarrhea, bloody stool, stomach pain, cough, congestion, diaphoresis, urinary frequency, urinary pain,skin lesions, skin rashes Others previously listed  Objective: Telemetry: Normal sinus rhythm Physical Exam: Blood pressure 127/73, pulse 67, temperature (!) 97.5 F (36.4 C), temperature source Oral, resp. rate 19, height 5\' 11"  (1.803 m), weight 62.6 kg, SpO2 (!) 88 %. Body mass index is 19.25 kg/m. General: Well developed, well nourished, in no acute distress. Head: Normocephalic, atraumatic, sclera non-icteric, no xanthomas, nares are without discharge. Neck: No apparent masses Lungs: Normal respirations with use some wheezes, no rhonchi, no rales , no crackles   Heart: Regular rate and rhythm, normal S1 S2, no murmur, no rub, no gallop, PMI is normal size and placement, carotid upstroke normal without bruit, jugular venous pressure normal Abdomen: Soft, non-tender, non-distended with normoactive bowel sounds. No hepatosplenomegaly. Abdominal aorta is normal size without bruit Extremities: No edema, no clubbing, no cyanosis, no ulcers,  Peripheral: 2+ radial, 2+ femoral, 2+ dorsal pedal pulses Neuro: Alert and oriented. Moves all extremities  spontaneously. Psych:  Responds to questions appropriately with a normal affect.   Intake/Output Summary (Last 24 hours) at 08/02/2021 1358 Last data filed at 08/02/2021 0205 Gross per 24 hour  Intake --  Output 175 ml  Net -175 ml    Inpatient Medications:   amiodarone  200 mg Oral Daily   apixaban  5 mg Oral BID   clopidogrel  75 mg Oral Daily   furosemide  20 mg Intravenous BID   losartan  50 mg Oral Daily   metoprolol succinate  50 mg Oral Daily   potassium chloride  40 mEq Oral Daily   sodium chloride flush  3 mL Intravenous Q12H   Infusions:   sodium chloride      Labs: Recent Labs    08/01/21 0735 08/01/21 1408 08/02/21 0636  NA 139  --  134*  K 2.9*  --  4.0  CL 93*  --  96*  CO2 30  --  28  GLUCOSE 177*  --  159*  BUN 13  --  20  CREATININE 1.01  --  1.10  CALCIUM 8.4*  --  8.5*  MG  --  1.9  --    No results for input(s): AST, ALT, ALKPHOS, BILITOT, PROT, ALBUMIN in the last 72 hours. Recent Labs    08/01/21 0445  WBC 15.4*  NEUTROABS 12.6*  HGB 16.2  HCT 47.2  MCV 91.1  PLT 195   No results for input(s): CKTOTAL, CKMB, TROPONINI in the last 72 hours. Invalid input(s): POCBNP No results for input(s): HGBA1C in the last 72 hours.   Weights: Filed Weights   08/01/21 0421  Weight: 62.6 kg     Radiology/Studies:  CT Angio Chest PE W and/or Wo Contrast  Result Date: 08/01/2021 CLINICAL DATA:  Lung carcinoma  post radiation therapy, shortness of breath, cough, high prob PE suspected EXAM: CT ANGIOGRAPHY CHEST WITH CONTRAST TECHNIQUE: Multidetector CT imaging of the chest was performed using the standard protocol during bolus administration of intravenous contrast. Multiplanar CT image reconstructions and MIPs were obtained to evaluate the vascular anatomy. CONTRAST:  67mL OMNIPAQUE IOHEXOL 350 MG/ML SOLN COMPARISON:  PET-CT 02/28/2021 and previous FINDINGS: Cardiovascular: Heart size normal. Small volume pericardial fluid. The RV is nondilated.  Satisfactory opacification of pulmonary arteries noted, and there is no evidence of pulmonary emboli. There is narrowing of distal left pulmonary artery with chronic occlusion of medial left upper lobe segmental branches. There is nonopacification of the superior left pulmonary vein. Coronary calcifications. Incomplete contrast opacification of the thoracic aorta. No evidence of aneurysm or proximal dissection. Scattered calcified atheromatous plaque in the isthmus and descending thoracic aorta. Mediastinum/Nodes: No definite mediastinal adenopathy. Confluent medial left upper lobe scarring extending to the hilum with fiducial markers as before. No mediastinal mass. Lungs/Pleura: Chronic left upper lobe scarring and consolidation with fiducial markers. Small bilateral pleural effusions left greater than right, new since previous, without pleural enhancement nodularity or loculation. Pulmonary emphysema. Dependent atelectasis at the right lung base. 3 nodules in the left lower lobe (Im36, 39, 61,Se6) are new since previous. There is also a cavitary 6 mm nodule in the anterior left lower lobe image 29, new since previous. Upper Abdomen: No acute finding or mass. Musculoskeletal: Mixed lytic/sclerotic lesion in the left side of the T10 vertebral body as previously described. No acute fracture. Review of the MIP images confirms the above findings. IMPRESSION: 1. Negative for acute PE or proximal thoracic aortic dissection. 2. 4 small left pulmonary nodules which were not evident on the prior study suggesting progression of disease. 3. New bilateral pleural effusions, left greater than right. Electronically Signed   By: Lucrezia Europe M.D.   On: 08/01/2021 07:50   DG Chest Portable 1 View  Result Date: 08/01/2021 CLINICAL DATA:  87 year old male with shortness of breath. Wheezing. History of treated non-small cell lung cancer in 2018. EXAM: PORTABLE CHEST 1 VIEW COMPARISON:  Portable chest 06/13/2021 and earlier.  FINDINGS: Portable AP upright view at 0425 hours. Skin fold artifact right greater than left. No convincing pneumothorax. Larger lung volumes compared to August. Chronic coarse pulmonary interstitial opacity which is asymmetrically greater on the right. Chronic post treatment changes and gas trapping in the left upper lung and along the left superior mediastinum appears stable. Other mediastinal contours appear stable. No convincing acute pulmonary opacity. Trachea appears stable. No acute osseous abnormality identified. IMPRESSION: Skin fold artifact more pronounced in the right upper lung. Chronic lung disease and chronic post treatment changes to the left upper lobe with no acute cardiopulmonary abnormality. Electronically Signed   By: Genevie Ann M.D.   On: 08/01/2021 05:32     Assessment and Recommendation  87 y.o. male with acute on chronic diastolic dysfunction congestive heart failure with pleural effusions and severe lung disease diabetes hypertension hyperlipidemia and paroxysmal nonvalvular atrial fibrillation without evidence of myocardial infarction 1.  Continue Lasix for further risk reduction in acute on chronic diastolic dysfunction congestive heart failure with better urine output and treatment of pleural effusions and hypoxia 2.  Continue to treat pulmonary disease as potential primary cause of his current symptoms although would consider the possibility of some side effects of the amiodarone 3.  Change to torsemide 20 mg each day for further risk reduction in recurrent episodes of heart failure and  reduction in pleural effusions 4.  High intensity cholesterol therapy for known coronary artery disease 5.  Eliquis for further risk reduction and stroke with atrial fibrillation 6.  Single antiplatelet therapy for coronary artery disease status post previous stent placement when discharged 7.  No further cardiac diagnostics necessary at this time 8.  If patient ambulating well with no further  significant symptoms okay for discharge home with follow-up in 1 week  Signed, Serafina Royals M.D. FACC

## 2021-08-02 NOTE — Progress Notes (Signed)
[facility] NAME: Scott Gross    MR#:  [id number]  DATE OF BIRTH:  [birth year]  SUBJECTIVE:   increasing weakness and shortness of breath. Feels a lot better. He wants to go home family at bedside. Denies any chest pain REVIEW OF SYSTEMS:   Review of Systems  Constitutional:  Negative for chills, fever and weight loss.  HENT:  Negative for ear discharge, ear pain and nosebleeds.   Eyes:  Negative for blurred vision, pain and discharge.  Respiratory:  Negative for sputum production, shortness of breath, wheezing and stridor.   Cardiovascular:  Negative for chest pain, palpitations, orthopnea and PND.  Gastrointestinal:  Negative for abdominal pain, diarrhea, nausea and vomiting.  Genitourinary:  Negative for frequency and urgency.  Musculoskeletal:  Negative for back pain and joint pain.  Neurological:  Negative for sensory change, speech change, focal weakness and weakness.  Psychiatric/Behavioral:  Negative for depression and hallucinations. The patient is not nervous/anxious.   Tolerating Diet:yes Tolerating PT:   DRUG ALLERGIES:   Allergies  Allergen Reactions   Statins Other (See Comments)    Arthralagia    VITALS:  Blood pressure 134/83, pulse 68, temperature (!) 97.5 F (36.4 C), temperature source Oral, resp. rate 19, height 5\' 11"  (1.803 m), weight 62.6 kg, SpO2 93 %.  PHYSICAL EXAMINATION:   Physical Exam  GENERAL:  87 y.o.-year-old patient lying in the bed with no acute distress.  LUNGS: Normal breath sounds bilaterally, no wheezing, rales, rhonchi. No use of accessory muscles of respiration.  CARDIOVASCULAR: S1, S2 normal. No murmurs, rubs, or gallops.  ABDOMEN: Soft, nontender, nondistended. Bowel sounds present. No organomegaly or mass.  EXTREMITIES: No cyanosis, clubbing or edema b/l.    NEUROLOGIC: Cranial nerves II through XII are intact. No focal Motor or sensory deficits b/l.   PSYCHIATRIC:  patient  is alert and oriented x 3.  SKIN: No obvious rash, lesion, or ulcer.   LABORATORY PANEL:  CBC Recent Labs  Lab 08/01/21 0445  WBC 15.4*  HGB 16.2  HCT 47.2  PLT 195    Chemistries  Recent Labs  Lab 08/01/21 1408 08/02/21 0636  NA  --  134*  K  --  4.0  CL  --  96*  CO2  --  28  GLUCOSE  --  159*  BUN  --  20  CREATININE  --  1.10  CALCIUM  --  8.5*  MG 1.9  --    Cardiac Enzymes No results for input(s): TROPONINI in the last 168 hours. RADIOLOGY:  CT Angio Chest PE W and/or Wo Contrast  Result Date: 08/01/2021 CLINICAL DATA:  Lung carcinoma post radiation therapy, shortness of breath, cough, high prob PE suspected EXAM: CT ANGIOGRAPHY CHEST WITH CONTRAST TECHNIQUE: Multidetector CT imaging of the chest was performed using the standard protocol during bolus administration of intravenous contrast. Multiplanar CT image reconstructions and MIPs were obtained to evaluate the vascular anatomy. CONTRAST:  50mL OMNIPAQUE IOHEXOL 350 MG/ML SOLN COMPARISON:  PET-CT 02/28/2021 and previous FINDINGS: Cardiovascular: Heart size normal. Small volume pericardial fluid. The RV is nondilated. Satisfactory opacification of pulmonary arteries noted, and there is no evidence of pulmonary emboli. There is narrowing of distal left pulmonary artery with chronic occlusion of medial left upper lobe segmental branches. There is nonopacification of the superior left pulmonary vein. Coronary calcifications. Incomplete contrast opacification of the thoracic aorta. No evidence of aneurysm or proximal dissection. Scattered calcified atheromatous plaque in  the isthmus and descending thoracic aorta. Mediastinum/Nodes: No definite mediastinal adenopathy. Confluent medial left upper lobe scarring extending to the hilum with fiducial markers as before. No mediastinal mass. Lungs/Pleura: Chronic left upper lobe scarring and consolidation with fiducial markers. Small bilateral pleural effusions left greater than right,  new since previous, without pleural enhancement nodularity or loculation. Pulmonary emphysema. Dependent atelectasis at the right lung base. 3 nodules in the left lower lobe (Im36, 39, 61,Se6) are new since previous. There is also a cavitary 6 mm nodule in the anterior left lower lobe image 29, new since previous. Upper Abdomen: No acute finding or mass. Musculoskeletal: Mixed lytic/sclerotic lesion in the left side of the T10 vertebral body as previously described. No acute fracture. Review of the MIP images confirms the above findings. IMPRESSION: 1. Negative for acute PE or proximal thoracic aortic dissection. 2. 4 small left pulmonary nodules which were not evident on the prior study suggesting progression of disease. 3. New bilateral pleural effusions, left greater than right. Electronically Signed   By: Lucrezia Europe M.D.   On: 08/01/2021 07:50   DG Chest Portable 1 View  Result Date: 08/01/2021 CLINICAL DATA:  87 year old male with shortness of breath. Wheezing. History of treated non-small cell lung cancer in 2018. EXAM: PORTABLE CHEST 1 VIEW COMPARISON:  Portable chest 06/13/2021 and earlier. FINDINGS: Portable AP upright view at 0425 hours. Skin fold artifact right greater than left. No convincing pneumothorax. Larger lung volumes compared to August. Chronic coarse pulmonary interstitial opacity which is asymmetrically greater on the right. Chronic post treatment changes and gas trapping in the left upper lung and along the left superior mediastinum appears stable. Other mediastinal contours appear stable. No convincing acute pulmonary opacity. Trachea appears stable. No acute osseous abnormality identified. IMPRESSION: Skin fold artifact more pronounced in the right upper lung. Chronic lung disease and chronic post treatment changes to the left upper lobe with no acute cardiopulmonary abnormality. Electronically Signed   By: Genevie Ann M.D.   On: 08/01/2021 05:32   ASSESSMENT AND PLAN:  Scott Gross  is a 87 y.o. male with medical history significant for coronary artery disease status post stent angioplasty, diabetes mellitus, paroxysmal A. fib on chronic anticoagulation therapy, history of lung cancer status post radiation therapy who presents to the emergency room via EMS history of worsening shortness of breath but worse on the morning of his presentation.  Acute on chronic combined systolic/diastolic dysfunction with CHF new bilateral pleural effusion left more than right -- patient started on IV Lasix 20 mg BID -- continue oxygen -- seen by Dr. Nehemiah Massed-- will consider ultrasound guided thoracentesis on the left to help with improving aeration -- BNP elevated. Echo 08/22-- EF 45 to 50% -- d/w wife regarding ultrasound thoracentesis--she wants to d/w dter  acute hypoxic respiratory failure with room air sats 79% requiring oxygen supplementation to maintain sats greater than 92% -- will assess for home oxygen need prior to discharge  hypokalemia suspected due to diuretic therapy -- replace potassium  paroxysmal atrial fibrillation -- continue amiodarone and metoprolol for rate control -- patient on chronic anticoagulation with eliquis  history of lung cancer-- small cell stage IV status post radiation treatment -- patient seen by Dr. Grayland Ormond for CT angiogram showing new pulmonary nodules -- Dr. Grayland Ormond recommends outpatient follow-up  type II diabetes with hyperglycemia with hyperlipidemia -- sliding scale insulin -- patient not taking any meds at home -- patient not taking statins at home     Procedures:  Family communication :wife on the phone Consults :Cardiology CODE STATUS: Full DVT Prophylaxis :Eliquis  Level of care: Progressive Cardiac Status is: Inpatient          TOTAL TIME TAKING CARE OF THIS PATIENT: 25 minutes.  >50% time spent on counselling and coordination of care  Note: This dictation was prepared with Dragon dictation along with smaller  phrase technology. Any transcriptional errors that result from this process are unintentional.  Fritzi Mandes M.D    [facility]   CC: Primary care physician; [name], MD Patient ID: Scott Gross, male   DOB: Aug 08, 1934, 87 y.o.   MRN: [id number]

## 2021-08-03 LAB — BASIC METABOLIC PANEL
Anion gap: 9 (ref 5–15)
BUN: 23 mg/dL (ref 8–23)
CO2: 27 mmol/L (ref 22–32)
Calcium: 7.8 mg/dL — ABNORMAL LOW (ref 8.9–10.3)
Chloride: 99 mmol/L (ref 98–111)
Creatinine, Ser: 1 mg/dL (ref 0.61–1.24)
GFR, Estimated: >60 mL/min (ref >60)
Glucose, Bld: 102 mg/dL — ABNORMAL HIGH (ref 70–99)
Potassium: 4 mmol/L (ref 3.5–5.1)
Sodium: 135 mmol/L (ref 135–145)

## 2021-08-03 LAB — BRAIN NATRIURETIC PEPTIDE: B Natriuretic Peptide: 574.1 pg/mL — ABNORMAL HIGH (ref 0.0–100.0)

## 2021-08-03 LAB — CBG MONITORING, ED: Glucose-Capillary: 108 mg/dL — ABNORMAL HIGH (ref 70–99)

## 2021-08-03 MED ORDER — TORSEMIDE 20 MG PO TABS: 20.0000 mg | ORAL_TABLET | Freq: Every day | ORAL | 0 refills | Status: AC

## 2021-08-03 NOTE — Evaluation (Signed)
Physical Therapy Evaluation Patient Details Name: Scott Gross MRN: [id number] DOB: 11/23/1933 Today's Date: 08/03/2021  History of Present Illness  87 y.o. male with medical history significant for coronary artery disease status post stent angioplasty, diabetes mellitus, paroxysmal A. fib on chronic anticoagulation therapy, history of lung cancer status post radiation therapy who presents to the emergency room via EMS history of worsening shortness of breath  Clinical Impression  Pt reports he has not been up much at all since Monday.  He ultimately did very well and showed good overall confidence, safety with ~300 ft of ambulation w/o AD.  Pt with no unsteadiness or safety issues, O2 remained in the 90s and HR in the 70s.  Pt safe to go home, and eager to do so today.     Recommendations for follow up therapy are one component of a multi-disciplinary discharge planning process, led by the attending physician.  Recommendations may be updated based on patient status, additional functional criteria and insurance authorization.  Follow Up Recommendations No PT follow up    Equipment Recommendations       Recommendations for Other Services       Precautions / Restrictions Precautions Precautions: None Restrictions Weight Bearing Restrictions: No      Mobility  Bed Mobility Overal bed mobility: Independent             General bed mobility comments: easily moves in the bed    Transfers Overall transfer level: Independent Equipment used: None             General transfer comment: rises to standing w/o hesitation or issue  Ambulation/Gait Ambulation/Gait assistance: Independent Gait Distance (Feet): 300 Feet Assistive device: None       General Gait Details: Pt walks with safe and confidence cadence. Able to stop, change direction/speed etc w/o LOBs.  HR stayed in the 70s, O2 remained in the mid/high 90s t/o the effort.  No real fatigue, some  wheezing.  Stairs            Wheelchair Mobility    Modified Rankin (Stroke Patients Only)       Balance Overall balance assessment: Independent                                           Pertinent Vitals/Pain Pain Assessment: No/denies pain    Home Living Family/patient expects to be discharged to:: Private residence Living Arrangements: Spouse/significant other Available Help at Discharge: Family   Home Access: Stairs to enter              Prior Function Level of Independence: Independent         Comments: Pt very active, mows yard, out of the home daily, walks for exercise     Hand Dominance        Extremity/Trunk Assessment   Upper Extremity Assessment Upper Extremity Assessment: Overall WFL for tasks assessed    Lower Extremity Assessment Lower Extremity Assessment: Overall WFL for tasks assessed       Communication   Communication: No difficulties  Cognition Arousal/Alertness: Awake/alert Behavior During Therapy: WFL for tasks assessed/performed Overall Cognitive Status: Within Functional Limits for tasks assessed  General Comments      Exercises     Assessment/Plan    PT Assessment Patient needs continued PT services  PT Problem List         PT Treatment Interventions      PT Goals (Current goals can be found in the Care Plan section)  Acute Rehab PT Goals Patient Stated Goal: Go home ASAP PT Goal Formulation: All assessment and education complete, DC therapy    Frequency     Barriers to discharge        Co-evaluation               AM-PAC PT "6 Clicks" Mobility  Outcome Measure Help needed turning from your back to your side while in a flat bed without using bedrails?: None Help needed moving from lying on your back to sitting on the side of a flat bed without using bedrails?: None Help needed moving to and from a bed to a chair  (including a wheelchair)?: None Help needed standing up from a chair using your arms (e.g., wheelchair or bedside chair)?: None Help needed to walk in hospital room?: None Help needed climbing 3-5 steps with a railing? : None 6 Click Score: 24    End of Session Equipment Utilized During Treatment: Gait belt Activity Tolerance: Patient tolerated treatment well Patient left: with chair alarm set;with call bell/phone within reach;with family/visitor present Nurse Communication: Mobility status PT Visit Diagnosis: Difficulty in walking, not elsewhere classified (R26.2) Pain - part of body:  (lumbago)    Time: 0945-1000 PT Time Calculation (min) (ACUTE ONLY): 15 min   Charges:   PT Evaluation $PT Eval Low Complexity: 1 Low          Kreg Shropshire, DPT 08/03/2021, 1:08 PM

## 2021-08-03 NOTE — Progress Notes (Signed)
[facility] Cardiology [facility] Encounter Note  Patient: Scott Gross / Admit Date: 08/01/2021 / Date of Encounter: 08/03/2021, 9:22 AM   Subjective: 10/19.  Overall patient is breathing better than yesterday and feeling much more comfortable.  No evidence of myocardial infarction with a peak troponin of 33.  Chest x-ray and CT scan suggest no evidence of significant pulmonary edema although there is pleural effusions contributing to his shortness of breath and hypoxia.  He did receive intravenous Lasix with some minimal urine output.  EKG Scott Gross remains in normal rhythm and no evidence of significant concerns  9/20.  Patient overall does very well overnight with no evidence of significant worsening chest pain or shortness of breath.  Patient has been ambulating minimally throughout the room.  Urine output not well documented although patient is feeling better.  Discussion about possible pleural effusion and thoracentesis for which the patient wishes to be more conservative and use medication management.  Shortness of breath and hypoxia have been multifactorial in nature.  Patient is hemodynamically stable Review of Systems: Positive for none Negative for: Vision change, hearing change, syncope, dizziness, nausea, vomiting,diarrhea, bloody stool, stomach pain, cough, congestion, diaphoresis, urinary frequency, urinary pain,skin lesions, skin rashes Others previously listed  Objective: Telemetry: Normal sinus rhythm Physical Exam: Blood pressure 134/82, pulse 61, temperature (!) 97.5 F (36.4 C), temperature source Oral, resp. rate 15, height 5\' 11"  (1.803 m), weight 62.6 kg, SpO2 94 %. Body mass index is 19.25 kg/m. General: Well developed, well nourished, in no acute distress. Head: Normocephalic, atraumatic, sclera non-icteric, no xanthomas, nares are without discharge. Neck: No apparent masses Lungs: Normal respirations with use some wheezes, no rhonchi, no rales , no crackles    Heart: Regular rate and rhythm, normal S1 S2, no murmur, no rub, no gallop, PMI is normal size and placement, carotid upstroke normal without bruit, jugular venous pressure normal Abdomen: Soft, non-tender, non-distended with normoactive bowel sounds. No hepatosplenomegaly. Abdominal aorta is normal size without bruit Extremities: No edema, no clubbing, no cyanosis, no ulcers,  Peripheral: 2+ radial, 2+ femoral, 2+ dorsal pedal pulses Neuro: Alert and oriented. Moves all extremities spontaneously. Psych:  Responds to questions appropriately with a normal affect.  No intake or output data in the 24 hours ending 08/03/21 0932   Inpatient Medications:   amiodarone  200 mg Oral Daily   apixaban  5 mg Oral BID   clopidogrel  75 mg Oral Daily   furosemide  20 mg Intravenous BID   insulin aspart  0-5 Units Subcutaneous QHS   insulin aspart  0-9 Units Subcutaneous TID WC   losartan  50 mg Oral Daily   metoprolol succinate  50 mg Oral Daily   potassium chloride  40 mEq Oral Daily   sodium chloride flush  3 mL Intravenous Q12H   Infusions:   sodium chloride      Labs: Recent Labs    08/01/21 1408 08/02/21 0636 08/03/21 0438  NA  --  134* 135  K  --  4.0 4.0  CL  --  96* 99  CO2  --  28 27  GLUCOSE  --  159* 102*  BUN  --  20 23  CREATININE  --  1.10 1.00  CALCIUM  --  8.5* 7.8*  MG 1.9  --   --     No results for input(s): AST, ALT, ALKPHOS, BILITOT, PROT, ALBUMIN in the last 72 hours. Recent Labs    08/01/21 0445  WBC 15.4*  NEUTROABS 12.6*  HGB 16.2  HCT 47.2  MCV 91.1  PLT 195    No results for input(s): CKTOTAL, CKMB, TROPONINI in the last 72 hours. Invalid input(s): POCBNP No results for input(s): HGBA1C in the last 72 hours.   Weights: Filed Weights   08/01/21 0421  Weight: 62.6 kg     Radiology/Studies:  CT Angio Chest PE W and/or Wo Contrast  Result Date: 08/01/2021 CLINICAL DATA:  Lung carcinoma post radiation therapy, shortness of breath, cough,  high prob PE suspected EXAM: CT ANGIOGRAPHY CHEST WITH CONTRAST TECHNIQUE: Multidetector CT imaging of the chest was performed using the standard protocol during bolus administration of intravenous contrast. Multiplanar CT image reconstructions and MIPs were obtained to evaluate the vascular anatomy. CONTRAST:  63mL OMNIPAQUE IOHEXOL 350 MG/ML SOLN COMPARISON:  PET-CT 02/28/2021 and previous FINDINGS: Cardiovascular: Heart size normal. Small volume pericardial fluid. The RV is nondilated. Satisfactory opacification of pulmonary arteries noted, and there is no evidence of pulmonary emboli. There is narrowing of distal left pulmonary artery with chronic occlusion of medial left upper lobe segmental branches. There is nonopacification of the superior left pulmonary vein. Coronary calcifications. Incomplete contrast opacification of the thoracic aorta. No evidence of aneurysm or proximal dissection. Scattered calcified atheromatous plaque in the isthmus and descending thoracic aorta. Mediastinum/Nodes: No definite mediastinal adenopathy. Confluent medial left upper lobe scarring extending to the hilum with fiducial markers as before. No mediastinal mass. Lungs/Pleura: Chronic left upper lobe scarring and consolidation with fiducial markers. Small bilateral pleural effusions left greater than right, new since previous, without pleural enhancement nodularity or loculation. Pulmonary emphysema. Dependent atelectasis at the right lung base. 3 nodules in the left lower lobe (Im36, 39, 61,Se6) are new since previous. There is also a cavitary 6 mm nodule in the anterior left lower lobe image 29, new since previous. Upper Abdomen: No acute finding or mass. Musculoskeletal: Mixed lytic/sclerotic lesion in the left side of the T10 vertebral body as previously described. No acute fracture. Review of the MIP images confirms the above findings. IMPRESSION: 1. Negative for acute PE or proximal thoracic aortic dissection. 2. 4 small  left pulmonary nodules which were not evident on the prior study suggesting progression of disease. 3. New bilateral pleural effusions, left greater than right. Electronically Signed   By: Lucrezia Europe M.D.   On: 08/01/2021 07:50   DG Chest Portable 1 View  Result Date: 08/01/2021 CLINICAL DATA:  87 year old male with shortness of breath. Wheezing. History of treated non-small cell lung cancer in 2018. EXAM: PORTABLE CHEST 1 VIEW COMPARISON:  Portable chest 06/13/2021 and earlier. FINDINGS: Portable AP upright view at 0425 hours. Skin fold artifact right greater than left. No convincing pneumothorax. Larger lung volumes compared to August. Chronic coarse pulmonary interstitial opacity which is asymmetrically greater on the right. Chronic post treatment changes and gas trapping in the left upper lung and along the left superior mediastinum appears stable. Other mediastinal contours appear stable. No convincing acute pulmonary opacity. Trachea appears stable. No acute osseous abnormality identified. IMPRESSION: Skin fold artifact more pronounced in the right upper lung. Chronic lung disease and chronic post treatment changes to the left upper lobe with no acute cardiopulmonary abnormality. Electronically Signed   By: Genevie Ann M.D.   On: 08/01/2021 05:32     Assessment and Recommendation  87 y.o. male with acute on chronic diastolic dysfunction congestive heart failure with pleural effusions and severe lung disease diabetes hypertension hyperlipidemia and paroxysmal nonvalvular atrial fibrillation without evidence  of myocardial infarction 1.  Continue Lasix for further risk reduction in acute on chronic diastolic dysfunction congestive heart failure with better urine output and treatment of pleural effusions and hypoxia using torsemide 20 mg each day upon outpatient and discharged home today 2.  Continue to treat pulmonary disease as potential primary cause of his current symptoms although would consider the  possibility of some side effects of the amiodarone.  Would consult pulmonary as an outpatient for further help with above 3.  Attenuation of ambulation today in follow-up for improvements of symptoms 4.  High intensity cholesterol therapy for known coronary artery disease 5.  Eliquis for further risk reduction and stroke with atrial fibrillation 6.  Single antiplatelet therapy for coronary artery disease status post previous stent placement when discharged 7.  No further cardiac diagnostics necessary at this time 8.  If patient ambulating well with no further significant symptoms okay for discharge home with follow-up in 1 week  Signed, Serafina Royals M.D. FACC

## 2021-08-03 NOTE — Discharge Summary (Signed)
[facility] NAME: Scott Gross    MR#:  [id number]  DATE OF BIRTH:  May 27, 1934  DATE OF ADMISSION:  08/01/2021 ADMITTING PHYSICIAN: Collier Bullock, MD  DATE OF DISCHARGE: 08/03/2021  PRIMARY CARE PHYSICIAN: [name], MD    ADMISSION DIAGNOSIS:  Acute on chronic combined systolic (congestive) and diastolic (congestive) heart failure ([place]) [I50.43]  DISCHARGE DIAGNOSIS:  Acute on  Chronic combined CHF  SECONDARY DIAGNOSIS:   Past Medical History:  Diagnosis Date   Arthritis    CAD (coronary artery disease) Nov. 12, 2012   Inferior ST elevation MI in November of 2012 with ventricular fibrillation arrest. Status post drug-eluting stent placement to the mid LAD. Most recent cardiac catheterization in June of this year showed patent stent with minimal restenosis, 75% proximal LAD and 80% distal LAD which were unchanged from before. Mild disease in the left circumflex and moderate RCA disease. Ejection fraction was 55%.   Cancer of upper lobe of left lung ([place]) 11/2016   Rad tx's.   Cardiac arrest [facility] Nov. 2012   x 2    Cardiac arrest - ventricular fibrillation    HOH (hard of hearing)    Left Hearing Aid   Hyperlipidemia    Hypertension    Post PTCA 2013   x2   ST elevation MI (STEMI) ([place]) 08/26/2011    HOSPITAL COURSE:  CYLAN BORUM is a 87 y.o. male with medical history significant for coronary artery disease status post stent angioplasty, diabetes mellitus, paroxysmal A. fib on chronic anticoagulation therapy, history of lung cancer status post radiation therapy who presents to the emergency room via EMS history of worsening shortness of breath but worse on the morning of his presentation.   Acute on chronic combined systolic/diastolic dysfunction with CHF new bilateral pleural effusion left more than right -- patient started on IV Lasix 20 mg BID -- continue oxygen -- seen by Dr. Nehemiah Massed-- will consider  ultrasound guided thoracentesis on the left to help with improving aeration -- BNP elevated. Echo 08/22-- EF 45 to 50% -- d/w wife regarding ultrasound thoracentesis--she wants to d/w dter --10/20--diuresed well. UOP not documented well. Sats >92% on RA Ambulated with PT w/o significant desaturation. No home oxygen needed. Change to po torsemide 20 mg qd at d/c. Pt to f/u dr Clayborn Bigness   acute hypoxic respiratory failure with room air sats 79% requiring oxygen supplementation to maintain sats greater than 92% -- will assess for home oxygen need prior to discharge--not needed --hypoxia improved with rx   hypokalemia suspected due to diuretic therapy -- replaced potassium   paroxysmal atrial fibrillation -- continue amiodarone and metoprolol for rate control -- patient on chronic anticoagulation with eliquis   history of lung cancer-- small cell stage IV status post radiation treatment -- patient seen by Dr. Grayland Ormond for CT angiogram showing new pulmonary nodules -- Dr. Grayland Ormond recommends outpatient follow-up   type II diabetes with hyperglycemia with hyperlipidemia -- sliding scale insulin -- patient not taking any meds at home -- patient not taking statins at home     Family communication :wife at bedside 10/20. Dtr becky on the phone 10/19 Consults :Cardiology CODE STATUS: Full DVT Prophylaxis :Eliquis  Level of care: Progressive Cardiac Status is: Inpatient   Overall improved. Clincally stable. D/c home. [place] with cardiology Pt and wife agreeable          CONSULTS OBTAINED:  Treatment Team:  Corey Skains, MD  DRUG  ALLERGIES:   Allergies  Allergen Reactions   Statins Other (See Comments)    Arthralagia    DISCHARGE MEDICATIONS:   Allergies as of 08/03/2021       Reactions   Statins Other (See Comments)   Arthralagia        Medication List     STOP taking these medications    aspirin 81 MG chewable tablet   rosuvastatin 5 MG tablet Commonly  known as: CRESTOR       TAKE these medications    albuterol 108 (90 Base) MCG/ACT inhaler Commonly known as: VENTOLIN HFA Inhale into the lungs every 6 (six) hours as needed.   amiodarone 200 MG tablet Commonly known as: Pacerone Take 1 tablet (200 mg total) by mouth 2 (two) times daily. What changed: when to take this   apixaban 5 MG Tabs tablet Commonly known as: ELIQUIS Take 1 tablet (5 mg total) by mouth 2 (two) times daily.   clopidogrel 75 MG tablet Commonly known as: PLAVIX Take 75 mg by mouth daily.   losartan-hydrochlorothiazide 50-12.5 MG tablet Commonly known as: HYZAAR Take 1 tablet by mouth daily.   metoprolol succinate 50 MG 24 hr tablet Commonly known as: TOPROL-XL Take 50 mg by mouth daily.   torsemide 20 MG tablet Commonly known as: DEMADEX Take 1 tablet (20 mg total) by mouth daily.        If you experience worsening of your admission symptoms, develop shortness of breath, life threatening emergency, suicidal or homicidal thoughts you must seek medical attention immediately by calling 911 or calling your MD immediately  if symptoms less severe.  You Must read complete instructions/literature along with all the possible adverse reactions/side effects for all the Medicines you take and that have been prescribed to you. Take any new Medicines after you have completely understood and accept all the possible adverse reactions/side effects.   Please note  You were cared for by a hospitalist during your hospital stay. If you have any questions about your discharge medications or the care you received while you were in the hospital after you are discharged, you can call the unit and asked to speak with the hospitalist on call if the hospitalist that took care of you is not available. Once you are discharged, your primary care physician will handle any further medical issues. Please note that NO REFILLS for any discharge medications will be authorized once you  are discharged, as it is imperative that you return to your primary care physician (or establish a relationship with a primary care physician if you do not have one) for your aftercare needs so that they can reassess your need for medications and monitor your lab values. Today   SUBJECTIVE   Doing well. No cp or sob  VITAL SIGNS:  Blood pressure 135/86, pulse 65, temperature (!) 97.5 F (36.4 C), temperature source Oral, resp. rate 16, height 5\' 11"  (1.803 m), weight 62.6 kg, SpO2 98 %.  I/O:  No intake or output data in the 24 hours ending 08/03/21 1124  PHYSICAL EXAMINATION:  GENERAL:  87 y.o.-year-old patient lying in the bed with no acute distress.  LUNGS: Normal breath sounds bilaterally, no wheezing, rales,rhonchi or crepitation. No use of accessory muscles of respiration.  CARDIOVASCULAR: S1, S2 normal. No murmurs, rubs, or gallops.  ABDOMEN: Soft, non-tender, non-distended. Bowel sounds present. No organomegaly or mass.  EXTREMITIES: No pedal edema, cyanosis, or clubbing.  NEUROLOGIC: Cranial nerves II through XII are intact. Muscle strength 5/5  in all extremities. Sensation intact. Gait not checked.  PSYCHIATRIC: The patient is alert and oriented x 3.  SKIN: No obvious rash, lesion, or ulcer.   DATA REVIEW:   CBC  Recent Labs  Lab 08/01/21 0445  WBC 15.4*  HGB 16.2  HCT 47.2  PLT 195    Chemistries  Recent Labs  Lab 08/01/21 1408 08/02/21 0636 08/03/21 0438  NA  --    < > 135  K  --    < > 4.0  CL  --    < > 99  CO2  --    < > 27  GLUCOSE  --    < > 102*  BUN  --    < > 23  CREATININE  --    < > 1.00  CALCIUM  --    < > 7.8*  MG 1.9  --   --    < > = values in this interval not displayed.    Microbiology Results   Recent Results (from the past 240 hour(s))  Resp Panel by RT-PCR (Flu A&B, Covid) Nasopharyngeal Swab     Status: None   Collection Time: 08/01/21  4:45 AM   Specimen: Nasopharyngeal Swab; Nasopharyngeal(NP) swabs in vial transport medium   Result Value Ref Range Status   SARS Coronavirus 2 by RT PCR NEGATIVE NEGATIVE Final    Comment: (NOTE) SARS-CoV-2 target nucleic acids are NOT DETECTED.  The SARS-CoV-2 RNA is generally detectable in upper respiratory specimens during the acute phase of infection. The lowest concentration of SARS-CoV-2 viral copies this assay can detect is 138 copies/mL. A negative result does not preclude SARS-Cov-2 infection and should not be used as the sole basis for treatment or other patient management decisions. A negative result may occur with  improper specimen collection/handling, submission of specimen other than nasopharyngeal swab, presence of viral mutation(s) within the areas targeted by this assay, and inadequate number of viral copies(<138 copies/mL). A negative result must be combined with clinical observations, patient history, and epidemiological information. The expected result is Negative.  Fact Sheet for Patients:  EntrepreneurPulse.com.au  Fact Sheet for Healthcare Providers:  IncredibleEmployment.be  This test is no t yet approved or cleared by the [place] FDA and  has been authorized for detection and/or diagnosis of SARS-CoV-2 by FDA under an Emergency Use Authorization (EUA). This EUA will remain  in effect (meaning this test can be used) for the duration of the COVID-19 declaration under Section 564(b)(1) of the Act, 21 U.S.C.section 360bbb-3(b)(1), unless the authorization is terminated  or revoked sooner.       Influenza A by PCR NEGATIVE NEGATIVE Final   Influenza B by PCR NEGATIVE NEGATIVE Final    Comment: (NOTE) The Xpert Xpress SARS-CoV-2/FLU/RSV plus assay is intended as an aid in the diagnosis of influenza from Nasopharyngeal swab specimens and should not be used as a sole basis for treatment. Nasal washings and aspirates are unacceptable for Xpert Xpress SARS-CoV-2/FLU/RSV testing.  Fact Sheet for  Patients: EntrepreneurPulse.com.au  Fact Sheet for Healthcare Providers: IncredibleEmployment.be  This test is not yet approved or cleared by the [place] FDA and has been authorized for detection and/or diagnosis of SARS-CoV-2 by FDA under an Emergency Use Authorization (EUA). This EUA will remain in effect (meaning this test can be used) for the duration of the COVID-19 declaration under Section 564(b)(1) of the Act, 21 U.S.C. section 360bbb-3(b)(1), unless the authorization is terminated or revoked.  Performed at [facility], [phone]  [address], [place],  [address]     RADIOLOGY:  No results found.   CODE STATUS:     Code Status Orders  (From admission, onward)           Start     Ordered   08/01/21 1210  Full code  Continuous        08/01/21 1210           Code Status History     Date Active Date Inactive Code Status Order ID Comments User Context   06/09/2021 0706 06/09/2021 2033 Full Code [id number]  Elwyn Reach, MD ED   06/06/2021 0327 06/08/2021 1738 Full Code [id number]  Athena Masse, MD Inpatient   06/06/2021 0200 06/06/2021 0327 Full Code [id number]  Wellington Hampshire, MD Inpatient   06/21/2019 0540 06/22/2019 1440 Full Code [id number]  Harrie Foreman, MD Inpatient   05/23/2019 2340 05/25/2019 1431 Full Code [id number]  Lance Coon, MD Inpatient   05/20/2019 1449 05/21/2019 1727 Full Code [id number]  Saundra Shelling, MD ED        TOTAL TIME TAKING CARE OF THIS PATIENT: 40 minutes.    Fritzi Mandes M.D  [facility]    CC: Primary care physician; [name], MD

## 2021-08-08 ENCOUNTER — Ambulatory Visit: Payer: Medicare HMO | Admitting: Radiation Oncology

## 2021-08-08 ENCOUNTER — Other Ambulatory Visit: Payer: Medicare HMO

## 2021-08-08 ENCOUNTER — Ambulatory Visit: Payer: Medicare HMO | Admitting: Oncology

## 2021-08-08 DIAGNOSIS — I110 Hypertensive heart disease with heart failure: Secondary | ICD-10-CM | POA: Diagnosis not present

## 2021-08-08 DIAGNOSIS — I509 Heart failure, unspecified: Secondary | ICD-10-CM | POA: Diagnosis not present

## 2021-08-08 DIAGNOSIS — J90 Pleural effusion, not elsewhere classified: Secondary | ICD-10-CM | POA: Diagnosis not present

## 2021-08-08 DIAGNOSIS — E119 Type 2 diabetes mellitus without complications: Secondary | ICD-10-CM | POA: Diagnosis not present

## 2021-08-08 DIAGNOSIS — I4891 Unspecified atrial fibrillation: Secondary | ICD-10-CM | POA: Diagnosis not present

## 2021-08-09 ENCOUNTER — Other Ambulatory Visit
Admission: RE | Admit: 2021-08-09 | Discharge: 2021-08-09 | Disposition: A | Discharge status: Home / Self Care | Payer: Medicare HMO | Source: Ambulatory Visit | Attending: Student | Admitting: Student

## 2021-08-09 DIAGNOSIS — R5383 Other fatigue: Secondary | ICD-10-CM | POA: Insufficient documentation

## 2021-08-09 DIAGNOSIS — R011 Cardiac murmur, unspecified: Secondary | ICD-10-CM | POA: Diagnosis not present

## 2021-08-09 DIAGNOSIS — I10 Essential (primary) hypertension: Secondary | ICD-10-CM | POA: Diagnosis not present

## 2021-08-09 DIAGNOSIS — I2121 ST elevation (STEMI) myocardial infarction involving left circumflex coronary artery: Secondary | ICD-10-CM | POA: Insufficient documentation

## 2021-08-09 DIAGNOSIS — I2510 Atherosclerotic heart disease of native coronary artery without angina pectoris: Secondary | ICD-10-CM | POA: Diagnosis not present

## 2021-08-09 DIAGNOSIS — Z79899 Other long term (current) drug therapy: Secondary | ICD-10-CM | POA: Diagnosis not present

## 2021-08-09 DIAGNOSIS — R0602 Shortness of breath: Secondary | ICD-10-CM | POA: Diagnosis not present

## 2021-08-09 DIAGNOSIS — I208 Other forms of angina pectoris: Secondary | ICD-10-CM | POA: Diagnosis not present

## 2021-08-09 DIAGNOSIS — I701 Atherosclerosis of renal artery: Secondary | ICD-10-CM | POA: Diagnosis not present

## 2021-08-09 DIAGNOSIS — I480 Paroxysmal atrial fibrillation: Secondary | ICD-10-CM | POA: Diagnosis not present

## 2021-08-09 LAB — BRAIN NATRIURETIC PEPTIDE: B Natriuretic Peptide: 236.9 pg/mL — ABNORMAL HIGH (ref 0.0–100.0)

## 2021-08-10 ENCOUNTER — Ambulatory Visit: Payer: Medicare HMO | Admitting: Oncology

## 2021-08-10 ENCOUNTER — Other Ambulatory Visit: Payer: Medicare HMO

## 2021-08-10 ENCOUNTER — Ambulatory Visit: Payer: Medicare HMO | Admitting: Radiation Oncology

## 2021-08-14 ENCOUNTER — Ambulatory Visit: Payer: Medicare HMO | Admitting: Family

## 2021-08-15 ENCOUNTER — Ambulatory Visit: Payer: Medicare HMO

## 2021-08-16 DIAGNOSIS — E876 Hypokalemia: Secondary | ICD-10-CM | POA: Diagnosis not present

## 2021-08-17 ENCOUNTER — Ambulatory Visit: Payer: Medicare HMO | Admitting: Radiation Oncology

## 2021-08-17 ENCOUNTER — Ambulatory Visit: Payer: Medicare HMO | Admitting: Oncology

## 2021-08-17 ENCOUNTER — Other Ambulatory Visit: Payer: Self-pay

## 2021-08-17 ENCOUNTER — Ambulatory Visit
Admission: RE | Admit: 2021-08-17 | Discharge: 2021-08-17 | Disposition: A | Discharge status: Home / Self Care | Payer: Medicare HMO | Source: Ambulatory Visit | Attending: Oncology | Admitting: Oncology

## 2021-08-17 ENCOUNTER — Other Ambulatory Visit: Payer: Medicare HMO

## 2021-08-17 DIAGNOSIS — J984 Other disorders of lung: Secondary | ICD-10-CM | POA: Insufficient documentation

## 2021-08-17 DIAGNOSIS — M419 Scoliosis, unspecified: Secondary | ICD-10-CM | POA: Diagnosis not present

## 2021-08-17 DIAGNOSIS — C3412 Malignant neoplasm of upper lobe, left bronchus or lung: Secondary | ICD-10-CM | POA: Insufficient documentation

## 2021-08-17 DIAGNOSIS — M47816 Spondylosis without myelopathy or radiculopathy, lumbar region: Secondary | ICD-10-CM | POA: Diagnosis not present

## 2021-08-17 DIAGNOSIS — I700 Atherosclerosis of aorta: Secondary | ICD-10-CM | POA: Insufficient documentation

## 2021-08-17 DIAGNOSIS — C3490 Malignant neoplasm of unspecified part of unspecified bronchus or lung: Secondary | ICD-10-CM | POA: Diagnosis not present

## 2021-08-17 DIAGNOSIS — C7951 Secondary malignant neoplasm of bone: Secondary | ICD-10-CM | POA: Diagnosis not present

## 2021-08-17 LAB — GLUCOSE, CAPILLARY: Glucose-Capillary: 106 mg/dL — ABNORMAL HIGH (ref 70–99)

## 2021-08-17 MED ORDER — FLUDEOXYGLUCOSE F - 18 (FDG) INJECTION
7.2000 | Freq: Once | INTRAVENOUS | Status: AC | PRN
  Administered 2021-08-17: 7.2 via INTRAVENOUS

## 2021-08-17 NOTE — Progress Notes (Signed)
[place]  [phone]) [phone] Fax:(336) [phone]  ID: Scott Gross OB: 1934/08/07  MR#: [id number]  SJG#:[id number]  Patient Care Team: [name], MD as PCP - General (Family Medicine) [name], MD as Consulting Physician (Oncology)   CHIEF COMPLAINT: Adenocarcinoma of upper lobe of left lung.  INTERVAL HISTORY: Patient returns to clinic today for hospital follow-up and discussion of his PET scan results.  He was recently admitted for CHF exacerbation and still has residual shortness of breath.  He has follow-up with cardiology next week.  He otherwise feels well.  He does not complain of any weakness or fatigue.  He denies pain.  He has a good appetite and denies weight loss.  He has no neurologic complaints. He denies any chest pain, cough, or hemoptysis.  He has no nausea, vomiting, constipation, or diarrhea. He has no urinary complaints.  Patient offers no further specific complaints today.  REVIEW OF SYSTEMS:   Review of Systems  Constitutional: Negative.  Negative for fever, malaise/fatigue and weight loss.  Respiratory:  Positive for shortness of breath. Negative for cough and hemoptysis.   Cardiovascular: Negative.  Negative for chest pain and leg swelling.  Gastrointestinal: Negative.  Negative for abdominal pain.  Genitourinary: Negative.  Negative for dysuria.  Musculoskeletal: Negative.  Negative for back pain and joint pain.  Skin: Negative.  Negative for rash.  Neurological: Negative.  Negative for sensory change, focal weakness, weakness and headaches.  Psychiatric/Behavioral: Negative.  The patient is not nervous/anxious.    As per HPI. Otherwise, a complete review of systems is negative.  PAST MEDICAL HISTORY: Past Medical History:  Diagnosis Date   Arthritis    CAD (coronary artery disease) Nov. 12, 2012   Inferior ST elevation MI in November of 2012 with ventricular fibrillation arrest. Status post drug-eluting stent  placement to the mid LAD. Most recent cardiac catheterization in June of this year showed patent stent with minimal restenosis, 75% proximal LAD and 80% distal LAD which were unchanged from before. Mild disease in the left circumflex and moderate RCA disease. Ejection fraction was 55%.   Cancer of upper lobe of left lung ([place]) 11/2016   Rad tx's.   Cardiac arrest [facility] Nov. 2012   x 2    Cardiac arrest - ventricular fibrillation    HOH (hard of hearing)    Left Hearing Aid   Hyperlipidemia    Hypertension    Post PTCA 2013   x2   ST elevation MI (STEMI) ([place]) 08/26/2011    PAST SURGICAL HISTORY: Past Surgical History:  Procedure Laterality Date   CARDIAC CATHETERIZATION  2013   @ [place]; [place] s/p stent    CORONARY ANGIOPLASTY     CORONARY/GRAFT ACUTE MI REVASCULARIZATION N/A 06/06/2021   Procedure: Coronary/Graft Acute MI Revascularization;  Surgeon: Wellington Hampshire, MD;  Location: [facility];  Service: Cardiovascular;  Laterality: N/A;   ELECTROMAGNETIC NAVIGATION BROCHOSCOPY N/A 11/27/2016   Procedure: ELECTROMAGNETIC NAVIGATION BRONCHOSCOPY;  Surgeon: [name], MD;  Location: [facility];  Service: Cardiopulmonary;  Laterality: N/A;   LEFT HEART CATH AND CORONARY ANGIOGRAPHY N/A 06/06/2021   Procedure: LEFT HEART CATH AND CORONARY ANGIOGRAPHY;  Surgeon: Wellington Hampshire, MD;  Location: [facility];  Service: Cardiovascular;  Laterality: N/A;    FAMILY HISTORY: Family History  Problem Relation Age of Onset   Heart attack Mother    Heart attack Father     ADVANCED DIRECTIVES (Y/N):  N  HEALTH MAINTENANCE: Social  History   Tobacco Use   Smoking status: Former    Packs/day: 1.00    Types: Cigarettes    Quit date: 08/27/2011    Years since quitting: 9.9   Smokeless tobacco: Never  Vaping Use   Vaping Use: Never used  Substance Use Topics   Alcohol use: No   Drug use: No     Colonoscopy:  PAP:  Bone density:  Lipid panel:  Allergies   Allergen Reactions   Statins Other (See Comments)    Arthralagia    Current Outpatient Medications  Medication Sig Dispense Refill   albuterol (VENTOLIN HFA) 108 (90 Base) MCG/ACT inhaler Inhale into the lungs every 6 (six) hours as needed.     amiodarone (PACERONE) 200 MG tablet Take 1 tablet (200 mg total) by mouth 2 (two) times daily. (Patient taking differently: Take 200 mg by mouth daily.) 60 tablet 0   amLODipine (NORVASC) 5 MG tablet Take by mouth.     apixaban (ELIQUIS) 5 MG TABS tablet Take 1 tablet (5 mg total) by mouth 2 (two) times daily. 60 tablet 1   clopidogrel (PLAVIX) 75 MG tablet Take 75 mg by mouth daily.     losartan-hydrochlorothiazide (HYZAAR) 50-12.5 MG tablet Take 1 tablet by mouth daily.     metoprolol succinate (TOPROL-XL) 50 MG 24 hr tablet Take 50 mg by mouth daily.     potassium chloride SA (KLOR-CON) 20 MEQ tablet Take by mouth.     torsemide (DEMADEX) 20 MG tablet Take 1 tablet (20 mg total) by mouth daily. 30 tablet 0   No current facility-administered medications for this visit.    OBJECTIVE: Vitals:   08/23/21 1006  BP: 132/77  Pulse: 75  Resp: 16  Temp: 97.8 F (36.6 C)  SpO2: 98%     Body mass index is 18.48 kg/m.    ECOG FS:0 - Asymptomatic  General: Thin, no acute distress. Eyes: Pink conjunctiva, anicteric sclera. HEENT: Normocephalic, moist mucous membranes. Lungs: No audible wheezing or coughing. Heart: Regular rate and rhythm. Abdomen: Soft, nontender, no obvious distention. Musculoskeletal: No edema, cyanosis, or clubbing. Neuro: Alert, answering all questions appropriately. Cranial nerves grossly intact. Skin: No rashes or petechiae noted. Psych: Normal affect.   LAB RESULTS:  Lab Results  Component Value Date   NA 135 08/03/2021   K 4.0 08/03/2021   CL 99 08/03/2021   CO2 27 08/03/2021   GLUCOSE 102 (H) 08/03/2021   BUN 23 08/03/2021   CREATININE 1.00 08/03/2021   CALCIUM 7.8 (L) 08/03/2021   PROT 6.4 (L)  06/13/2021   ALBUMIN 3.6 06/13/2021   AST 22 06/13/2021   ALT 18 06/13/2021   ALKPHOS 83 06/13/2021   BILITOT 0.9 06/13/2021   GFRNONAA >60 08/03/2021   GFRAA >60 03/15/2020    Lab Results  Component Value Date   WBC 15.4 (H) 08/01/2021   NEUTROABS 12.6 (H) 08/01/2021   HGB 16.2 08/01/2021   HCT 47.2 08/01/2021   MCV 91.1 08/01/2021   PLT 195 08/01/2021     STUDIES: CT Angio Chest PE W and/or Wo Contrast  Result Date: 08/01/2021 CLINICAL DATA:  Lung carcinoma post radiation therapy, shortness of breath, cough, high prob PE suspected EXAM: CT ANGIOGRAPHY CHEST WITH CONTRAST TECHNIQUE: Multidetector CT imaging of the chest was performed using the standard protocol during bolus administration of intravenous contrast. Multiplanar CT image reconstructions and MIPs were obtained to evaluate the vascular anatomy. CONTRAST:  29m OMNIPAQUE IOHEXOL 350 MG/ML SOLN COMPARISON:  PET-CT 02/28/2021  and previous FINDINGS: Cardiovascular: Heart size normal. Small volume pericardial fluid. The RV is nondilated. Satisfactory opacification of pulmonary arteries noted, and there is no evidence of pulmonary emboli. There is narrowing of distal left pulmonary artery with chronic occlusion of medial left upper lobe segmental branches. There is nonopacification of the superior left pulmonary vein. Coronary calcifications. Incomplete contrast opacification of the thoracic aorta. No evidence of aneurysm or proximal dissection. Scattered calcified atheromatous plaque in the isthmus and descending thoracic aorta. Mediastinum/Nodes: No definite mediastinal adenopathy. Confluent medial left upper lobe scarring extending to the hilum with fiducial markers as before. No mediastinal mass. Lungs/Pleura: Chronic left upper lobe scarring and consolidation with fiducial markers. Small bilateral pleural effusions left greater than right, new since previous, without pleural enhancement nodularity or loculation. Pulmonary  emphysema. Dependent atelectasis at the right lung base. 3 nodules in the left lower lobe (Im36, 39, 61,Se6) are new since previous. There is also a cavitary 6 mm nodule in the anterior left lower lobe image 29, new since previous. Upper Abdomen: No acute finding or mass. Musculoskeletal: Mixed lytic/sclerotic lesion in the left side of the T10 vertebral body as previously described. No acute fracture. Review of the MIP images confirms the above findings. IMPRESSION: 1. Negative for acute PE or proximal thoracic aortic dissection. 2. 4 small left pulmonary nodules which were not evident on the prior study suggesting progression of disease. 3. New bilateral pleural effusions, left greater than right. Electronically Signed   By: Lucrezia Europe M.D.   On: 08/01/2021 07:50   NM PET Image Restag (PS) Skull Base To Thigh  Result Date: 08/18/2021 CLINICAL DATA:  Subsequent treatment strategy for non-small cell lung cancer. EXAM: NUCLEAR MEDICINE PET SKULL BASE TO THIGH TECHNIQUE: 7.7 mCi F-18 FDG was injected intravenously. Full-ring PET imaging was performed from the skull base to thigh after the radiotracer. CT data was obtained and used for attenuation correction and anatomic localization. Fasting blood glucose: 106 mg/dl COMPARISON:  02/28/2021 FINDINGS: Mediastinal blood pool activity: SUV max 2.7 Liver activity: SUV max NA NECK: No hypermetabolic lymph nodes in the neck. Incidental CT findings: none CHEST: Similar appearance of FDG accumulation in the medial left upper lobe scarring with SUV max = 3.8 today compared to 3.3 previously. No new hypermetabolic disease in the chest on today's exam. Incidental CT findings: Multiple tiny bilateral pulmonary nodules measuring in the 3-4 mm size range are identified. These were characterized as new on CTA Chest 08/01/2021. Tiny right upper lobe nodule visible on image 71/3 was not definitely present on the 08/01/2021 exam. The left pulmonary nodule seen on the 08/01/2021 exam  are visible on images 77, 84, 87, and 109 of series 3 today. All of these are below size threshold for resolution on PET imaging. ABDOMEN/PELVIS: No abnormal hypermetabolic activity within the liver, pancreas, adrenal glands, or spleen. No hypermetabolic lymph nodes in the abdomen or pelvis. Previously characterized focal hypermetabolism in the rectosigmoid colon is again noted with SUV max = 5.9 today compared to 4.2 previously. Incidental CT findings: There is moderate atherosclerotic calcification of the abdominal aorta without aneurysm. SKELETON: Interval progression of multiple hypermetabolic bone metastases. Index lesion posterior right iliac bone demonstrates SUV max = 7.0 today compared to 6.4 previously. Lesion has increased in size measuring 4.2 cm today compared to 2.9 cm previously. 3 cm lytic lesion left iliac bone (image 205/3) was 2.4 cm previously. SUV max = 13.3 today compared to 8.8 previously. New 12 mm sclerotic lesion in the  right sacrum (401/0) is hypermetabolic with SUV max = 5.0. Hypermetabolic bone lesions noted in the thoracolumbar spine. Incidental CT findings: Thoracolumbar scoliosis. Degenerative changes noted in the lumbar spine. IMPRESSION: 1. Interval continued progression of bony metastatic disease with new hypermetabolic bone lesion in the right sacrum on today's study. 2. Multiple tiny bilateral pulmonary nodules identified as new on recent CTA Chest are stable in the interval and remain concerning for metastatic involvement. 3. Similar appearance of left upper lobe scarring with low level FDG uptake. 4. Small focus of hypermetabolism in the left colon near the rectosigmoid junction is again noted. Adenoma or carcinoma could have this appearance. 5.  Aortic Atherosclerois (ICD10-170.0) Electronically Signed   By: Misty Stanley M.D.   On: 08/18/2021 08:27   DG Chest Portable 1 View  Result Date: 08/01/2021 CLINICAL DATA:  87 year old male with shortness of breath. Wheezing.  History of treated non-small cell lung cancer in 2018. EXAM: PORTABLE CHEST 1 VIEW COMPARISON:  Portable chest 06/13/2021 and earlier. FINDINGS: Portable AP upright view at 0425 hours. Skin fold artifact right greater than left. No convincing pneumothorax. Larger lung volumes compared to August. Chronic coarse pulmonary interstitial opacity which is asymmetrically greater on the right. Chronic post treatment changes and gas trapping in the left upper lung and along the left superior mediastinum appears stable. Other mediastinal contours appear stable. No convincing acute pulmonary opacity. Trachea appears stable. No acute osseous abnormality identified. IMPRESSION: Skin fold artifact more pronounced in the right upper lung. Chronic lung disease and chronic post treatment changes to the left upper lobe with no acute cardiopulmonary abnormality. Electronically Signed   By: Genevie Ann M.D.   On: 08/01/2021 05:32     ONCOLOGY HISTORY:  Biopsy of lung nodule on November 27, 2016 revealed adenocarcinoma, but there was no invasive component on the sample. Biopsy of mediastinal lymph node had insufficient material. Patient ultimately declined any further biopsies or surgery and elected to proceed with XRT alone which he completed in April 2018.  PET scan on January 22, 2019 reviewed independently with increased metabolic activity prior to previous with SUV increasing from 6.5 up to 8.8.  Patient declined intervention at that time and elected to proceed with simple observation.  Repeat CT scan on April 21, 2019 reviewed independently with progressive 6.4 cm mass in the left upper lobe.  Patient declined systemic treatment, but agreed to proceed with XRT which she completed in approximately August 2020.  CT scan on September 23, 2019 reviewed independently with no obvious evidence of recurrent or progressive disease.   ASSESSMENT:  Adenocarcinoma of upper lobe of left lung   PLAN:   1.  Adenocarcinoma of upper lobe of left  lung: PET scan results from August 17, 2021 reviewed independently and reported as above with interval continued progression of bony metastatic disease and a new hypermetabolic bone lesion in the right sacrum.  Bilateral pulmonary nodules are stable, but concerning for metastatic involvement.  We once again discussed systemic therapy.  Patient wife state they will decline aggressive chemotherapy, but may consider immunotherapy in the future despite PD-L1 being unknown.  No intervention is needed.  Return to clinic in 3 months with repeat CT scan and further evaluation.   2.  Shoulder/back pain: Patient does not complain of this today.  He reports this has been evident for years/decades: Continue tramadol as needed. 3.  Shortness of breath/CHF: Follow-up with cardiology as scheduled next week.   Patient expressed understanding and was in agreement  with this plan. He also understands that He can call clinic at any time with any questions, concerns, or complaints.     [name], MD   08/23/2021 3:18 PM

## 2021-08-23 ENCOUNTER — Ambulatory Visit
Admission: RE | Admit: 2021-08-23 | Discharge: 2021-08-23 | Disposition: A | Discharge status: Home / Self Care | Payer: Medicare HMO | Source: Ambulatory Visit | Attending: Radiation Oncology | Admitting: Radiation Oncology

## 2021-08-23 ENCOUNTER — Other Ambulatory Visit: Payer: Self-pay

## 2021-08-23 ENCOUNTER — Inpatient Hospital Stay: Payer: Medicare HMO

## 2021-08-23 ENCOUNTER — Inpatient Hospital Stay: Payer: Medicare HMO | Attending: Oncology | Admitting: Oncology

## 2021-08-23 VITALS — BP 132/77 | HR 75 | Temp 97.8°F | Resp 16 | Wt 132.5 lb

## 2021-08-23 DIAGNOSIS — Z79899 Other long term (current) drug therapy: Secondary | ICD-10-CM | POA: Insufficient documentation

## 2021-08-23 DIAGNOSIS — C7951 Secondary malignant neoplasm of bone: Secondary | ICD-10-CM | POA: Insufficient documentation

## 2021-08-23 DIAGNOSIS — R0602 Shortness of breath: Secondary | ICD-10-CM | POA: Insufficient documentation

## 2021-08-23 DIAGNOSIS — Z888 Allergy status to other drugs, medicaments and biological substances status: Secondary | ICD-10-CM | POA: Insufficient documentation

## 2021-08-23 DIAGNOSIS — I509 Heart failure, unspecified: Secondary | ICD-10-CM | POA: Diagnosis not present

## 2021-08-23 DIAGNOSIS — I110 Hypertensive heart disease with heart failure: Secondary | ICD-10-CM | POA: Insufficient documentation

## 2021-08-23 DIAGNOSIS — M47816 Spondylosis without myelopathy or radiculopathy, lumbar region: Secondary | ICD-10-CM | POA: Diagnosis not present

## 2021-08-23 DIAGNOSIS — J439 Emphysema, unspecified: Secondary | ICD-10-CM | POA: Diagnosis not present

## 2021-08-23 DIAGNOSIS — C3490 Malignant neoplasm of unspecified part of unspecified bronchus or lung: Secondary | ICD-10-CM

## 2021-08-23 DIAGNOSIS — J90 Pleural effusion, not elsewhere classified: Secondary | ICD-10-CM | POA: Insufficient documentation

## 2021-08-23 DIAGNOSIS — Z8249 Family history of ischemic heart disease and other diseases of the circulatory system: Secondary | ICD-10-CM | POA: Insufficient documentation

## 2021-08-23 DIAGNOSIS — I252 Old myocardial infarction: Secondary | ICD-10-CM | POA: Diagnosis not present

## 2021-08-23 DIAGNOSIS — C3412 Malignant neoplasm of upper lobe, left bronchus or lung: Secondary | ICD-10-CM | POA: Insufficient documentation

## 2021-08-23 DIAGNOSIS — Z7901 Long term (current) use of anticoagulants: Secondary | ICD-10-CM | POA: Diagnosis not present

## 2021-08-23 DIAGNOSIS — Z923 Personal history of irradiation: Secondary | ICD-10-CM | POA: Insufficient documentation

## 2021-08-23 DIAGNOSIS — Z08 Encounter for follow-up examination after completed treatment for malignant neoplasm: Secondary | ICD-10-CM | POA: Diagnosis not present

## 2021-08-23 NOTE — Progress Notes (Signed)
Pt d/c from hospital on 10/20 and states he is feeling better. Pt c/o pain in shoulder and numbness in feet x2 weeks. Wife states hands and feet stay cold.

## 2021-08-23 NOTE — Progress Notes (Signed)
Radiation Oncology Follow up Note  Name: Scott Gross   Date:   08/23/2021 MRN:  [id number] DOB: March 27, 1934    This 87 y.o. male presents to the clinic today for 44-month follow-up status post SBRT to his T10 vertebral body in patient with stage IV adenocarcinoma lung with tumor encroaching the spinal canal..  REFERRING PROVIDER: [name], MD  HPI: Patient is an 87 year old male treated multiple times in the department for palliative radiation therapy for stage IV non-small cell lung cancer.  He is seen out 1 4 months since completing SBRT to his T10 vertebral body.  He is having no back pain at this time.  He had a recent PET CT scan showing progressive disease with bony metastatic disease in the right sacrum as well as left iliac crest.  He has multiple tiny bilateral pulmonary nodules stable in the interval since last imaging.  He does claim of having some what sounds like peripheral neuropathy with numbness in his feet.  That is being followed by his other doctors.  He specifically denies any sacral pain or pain in the left iliac crest region.  COMPLICATIONS OF TREATMENT: none  FOLLOW UP COMPLIANCE: keeps appointments   PHYSICAL EXAM:  There were no vitals taken for this visit. Well-developed well-nourished patient in NAD. HEENT reveals PERLA, EOMI, discs not visualized.  Oral cavity is clear. No oral mucosal lesions are identified. Neck is clear without evidence of cervical or supraclavicular adenopathy. Lungs are clear to A&P. Cardiac examination is essentially unremarkable with regular rate and rhythm without murmur rub or thrill. Abdomen is benign with no organomegaly or masses noted. Motor sensory and DTR levels are equal and symmetric in the upper and lower extremities. Cranial nerves II through XII are grossly intact. Proprioception is intact. No peripheral adenopathy or edema is identified. No motor or sensory levels are noted. Crude visual fields are within normal  range.  RADIOLOGY RESULTS: CT scans and recent PET CT scans reviewed compatible with above-stated findings  PLAN: Present time we will continue observe the patient he will see Dr. Grayland Ormond after a repeat CT scan in 3 months.  I have asked to see him back in 6 months for follow-up.  Patient and wife know to call with any concerns especially should he develop pain in his lower back or left hip region may be amenable to palliative radiation.  I would like to take this opportunity to thank you for allowing me to participate in the care of your patient.Noreene Filbert, MD

## 2021-09-04 DIAGNOSIS — I2510 Atherosclerotic heart disease of native coronary artery without angina pectoris: Secondary | ICD-10-CM | POA: Diagnosis not present

## 2021-09-04 DIAGNOSIS — I701 Atherosclerosis of renal artery: Secondary | ICD-10-CM | POA: Diagnosis not present

## 2021-09-04 DIAGNOSIS — R011 Cardiac murmur, unspecified: Secondary | ICD-10-CM | POA: Diagnosis not present

## 2021-09-04 DIAGNOSIS — I480 Paroxysmal atrial fibrillation: Secondary | ICD-10-CM | POA: Diagnosis not present

## 2021-09-04 DIAGNOSIS — R9431 Abnormal electrocardiogram [ECG] [EKG]: Secondary | ICD-10-CM | POA: Diagnosis not present

## 2021-09-04 DIAGNOSIS — R5383 Other fatigue: Secondary | ICD-10-CM | POA: Diagnosis not present

## 2021-09-04 DIAGNOSIS — I2121 ST elevation (STEMI) myocardial infarction involving left circumflex coronary artery: Secondary | ICD-10-CM | POA: Diagnosis not present

## 2021-09-04 DIAGNOSIS — I10 Essential (primary) hypertension: Secondary | ICD-10-CM | POA: Diagnosis not present

## 2021-09-04 DIAGNOSIS — R0602 Shortness of breath: Secondary | ICD-10-CM | POA: Diagnosis not present

## 2021-09-12 ENCOUNTER — Telehealth: Payer: Self-pay | Admitting: Oncology

## 2021-09-12 NOTE — Telephone Encounter (Signed)
Wife called in and does not want husband to be seen by NP in Feb. She feels like MD should be the one to go over his scan. Please give a call back with change at [phone]. If no answer leave a message.

## 2021-11-07 DIAGNOSIS — R5383 Other fatigue: Secondary | ICD-10-CM | POA: Diagnosis not present

## 2021-11-07 DIAGNOSIS — I10 Essential (primary) hypertension: Secondary | ICD-10-CM | POA: Diagnosis not present

## 2021-11-07 DIAGNOSIS — I480 Paroxysmal atrial fibrillation: Secondary | ICD-10-CM | POA: Diagnosis not present

## 2021-11-07 DIAGNOSIS — R9431 Abnormal electrocardiogram [ECG] [EKG]: Secondary | ICD-10-CM | POA: Diagnosis not present

## 2021-11-07 DIAGNOSIS — I2510 Atherosclerotic heart disease of native coronary artery without angina pectoris: Secondary | ICD-10-CM | POA: Diagnosis not present

## 2021-11-07 DIAGNOSIS — R0602 Shortness of breath: Secondary | ICD-10-CM | POA: Diagnosis not present

## 2021-11-07 DIAGNOSIS — R270 Ataxia, unspecified: Secondary | ICD-10-CM | POA: Diagnosis not present

## 2021-11-07 DIAGNOSIS — R011 Cardiac murmur, unspecified: Secondary | ICD-10-CM | POA: Diagnosis not present

## 2021-11-07 DIAGNOSIS — I2121 ST elevation (STEMI) myocardial infarction involving left circumflex coronary artery: Secondary | ICD-10-CM | POA: Diagnosis not present

## 2021-11-23 ENCOUNTER — Ambulatory Visit
Admission: RE | Admit: 2021-11-23 | Discharge: 2021-11-23 | Disposition: A | Discharge status: Home / Self Care | Payer: Medicare HMO | Source: Ambulatory Visit | Attending: Oncology | Admitting: Oncology

## 2021-11-23 ENCOUNTER — Other Ambulatory Visit: Payer: Self-pay

## 2021-11-23 DIAGNOSIS — I2510 Atherosclerotic heart disease of native coronary artery without angina pectoris: Secondary | ICD-10-CM | POA: Diagnosis not present

## 2021-11-23 DIAGNOSIS — S3210XA Unspecified fracture of sacrum, initial encounter for closed fracture: Secondary | ICD-10-CM | POA: Diagnosis not present

## 2021-11-23 DIAGNOSIS — G35 Multiple sclerosis: Secondary | ICD-10-CM | POA: Diagnosis not present

## 2021-11-23 DIAGNOSIS — J90 Pleural effusion, not elsewhere classified: Secondary | ICD-10-CM | POA: Diagnosis not present

## 2021-11-23 DIAGNOSIS — I700 Atherosclerosis of aorta: Secondary | ICD-10-CM | POA: Diagnosis not present

## 2021-11-23 DIAGNOSIS — C7951 Secondary malignant neoplasm of bone: Secondary | ICD-10-CM | POA: Diagnosis not present

## 2021-11-23 DIAGNOSIS — C3412 Malignant neoplasm of upper lobe, left bronchus or lung: Secondary | ICD-10-CM | POA: Insufficient documentation

## 2021-11-23 LAB — POCT I-STAT CREATININE: Creatinine, Ser: 1.1 mg/dL (ref 0.61–1.24)

## 2021-11-23 MED ORDER — IOHEXOL 300 MG/ML  SOLN
100.0000 mL | Freq: Once | INTRAMUSCULAR | Status: AC | PRN
Start: 2021-11-23 — End: 2021-11-23
  Administered 2021-11-23: 100 mL via INTRAVENOUS

## 2021-11-27 ENCOUNTER — Ambulatory Visit: Payer: Medicare HMO | Admitting: Nurse Practitioner

## 2021-11-27 NOTE — Progress Notes (Signed)
Scott Gross  [phone]) [phone] [phone]) [phone]  ID: Scott Gross OB: 1934/03/09  MR#: [id number]  EXH#:[id number]  Patient Care Team: [name], MD as PCP - General (Family Medicine) [name], MD as Consulting Physician (Oncology)   CHIEF COMPLAINT: Adenocarcinoma of upper lobe of left lung.  INTERVAL HISTORY: Patient returns to clinic today for further evaluation, discussion of his imaging results, and treatment planning.  He currently feels well and is asymptomatic.  He does not complain of any shortness of breath today.  He is accompanied by his daughter. He does not complain of any weakness or fatigue.  He denies pain.  He has a good appetite and denies weight loss.  He has no neurologic complaints. He denies any chest pain, cough, or hemoptysis.  He has no nausea, vomiting, constipation, or diarrhea. He has no urinary complaints.  Patient offers no specific complaints today.  REVIEW OF SYSTEMS:   Review of Systems  Constitutional: Negative.  Negative for fever, malaise/fatigue and weight loss.  Respiratory: Negative.  Negative for cough, hemoptysis and shortness of breath.   Cardiovascular: Negative.  Negative for chest pain and leg swelling.  Gastrointestinal: Negative.  Negative for abdominal pain.  Genitourinary: Negative.  Negative for dysuria.  Musculoskeletal: Negative.  Negative for back pain and joint pain.  Skin: Negative.  Negative for rash.  Neurological: Negative.  Negative for sensory change, focal weakness, weakness and headaches.  Psychiatric/Behavioral: Negative.  The patient is not nervous/anxious.    As per HPI. Otherwise, a complete review of systems is negative.  PAST MEDICAL HISTORY: Past Medical History:  Diagnosis Date   Arthritis    CAD (coronary artery disease) Nov. 12, 2012   Inferior ST elevation MI in November of 2012 with ventricular fibrillation arrest. Status post drug-eluting stent placement to the  mid LAD. Most recent cardiac catheterization in June of this year showed patent stent with minimal restenosis, 75% proximal LAD and 80% distal LAD which were unchanged from before. Mild disease in the left circumflex and moderate RCA disease. Ejection fraction was 55%.   Cancer of upper lobe of left lung ([place]) 11/2016   Rad tx's.   Cardiac arrest [facility]) Nov. 2012   x 2    Cardiac arrest - ventricular fibrillation    HOH (hard of hearing)    Left Hearing Aid   Hyperlipidemia    Hypertension    Post PTCA 2013   x2   ST elevation MI (STEMI) ([place]) 08/26/2011    PAST SURGICAL HISTORY: Past Surgical History:  Procedure Laterality Date   CARDIAC CATHETERIZATION  2013   @ [place]; [place] s/p stent    CORONARY ANGIOPLASTY     CORONARY/GRAFT ACUTE MI REVASCULARIZATION N/A 06/06/2021   Procedure: Coronary/Graft Acute MI Revascularization;  Surgeon: Wellington Hampshire, MD;  Location: [place] [facility];  Service: Cardiovascular;  Laterality: N/A;   ELECTROMAGNETIC NAVIGATION BROCHOSCOPY N/A 11/27/2016   Procedure: ELECTROMAGNETIC NAVIGATION BRONCHOSCOPY;  Surgeon: [name], MD;  Location: [facility];  Service: Cardiopulmonary;  Laterality: N/A;   LEFT HEART CATH AND CORONARY ANGIOGRAPHY N/A 06/06/2021   Procedure: LEFT HEART CATH AND CORONARY ANGIOGRAPHY;  Surgeon: Wellington Hampshire, MD;  Location: [place] [facility];  Service: Cardiovascular;  Laterality: N/A;    FAMILY HISTORY: Family History  Problem Relation Age of Onset   Heart attack Mother    Heart attack Father     ADVANCED DIRECTIVES (Y/N):  N  HEALTH MAINTENANCE: Social History   Tobacco  Use   Smoking status: Former    Packs/day: 1.00    Types: Cigarettes    Quit date: 08/27/2011    Years since quitting: 10.2   Smokeless tobacco: Never  Vaping Use   Vaping Use: Never used  Substance Use Topics   Alcohol use: No   Drug use: No     Colonoscopy:  PAP:  Bone density:  Lipid panel:  Allergies  Allergen Reactions    Statins Other (See Comments)    Arthralagia    Current Outpatient Medications  Medication Sig Dispense Refill   albuterol (VENTOLIN HFA) 108 (90 Base) MCG/ACT inhaler Inhale into the lungs every 6 (six) hours as needed.     amiodarone (PACERONE) 200 MG tablet Take 1 tablet (200 mg total) by mouth 2 (two) times daily. (Patient taking differently: Take 200 mg by mouth daily.) 60 tablet 0   apixaban (ELIQUIS) 5 MG TABS tablet Take 1 tablet (5 mg total) by mouth 2 (two) times daily. 60 tablet 1   clopidogrel (PLAVIX) 75 MG tablet Take 75 mg by mouth daily.     losartan-hydrochlorothiazide (HYZAAR) 50-12.5 MG tablet Take 1 tablet by mouth daily.     metoprolol succinate (TOPROL-XL) 50 MG 24 hr tablet Take 50 mg by mouth daily.     rosuvastatin (CRESTOR) 5 MG tablet Take by mouth.     torsemide (DEMADEX) 20 MG tablet Take 1 tablet (20 mg total) by mouth daily. 30 tablet 0   amLODipine (NORVASC) 5 MG tablet Take by mouth. (Patient not taking: Reported on 11/28/2021)     potassium chloride SA (KLOR-CON) 20 MEQ tablet Take by mouth. (Patient not taking: Reported on 11/28/2021)     No current facility-administered medications for this visit.    OBJECTIVE: Vitals:   11/28/21 1000  BP: (!) 147/69  Pulse: 62  Resp: 18  SpO2: 99%     Body mass index is 18.49 kg/m.    ECOG FS:0 - Asymptomatic  General: Thin, no acute distress. Eyes: Pink conjunctiva, anicteric sclera. HEENT: Normocephalic, moist mucous membranes. Lungs: No audible wheezing or coughing. Heart: Regular rate and rhythm. Abdomen: Soft, nontender, no obvious distention. Musculoskeletal: No edema, cyanosis, or clubbing. Neuro: Alert, answering all questions appropriately. Cranial nerves grossly intact. Skin: No rashes or petechiae noted. Psych: Normal affect.  LAB RESULTS:  Lab Results  Component Value Date   NA 135 08/03/2021   K 4.0 08/03/2021   CL 99 08/03/2021   CO2 27 08/03/2021   GLUCOSE 102 (H) 08/03/2021   BUN  23 08/03/2021   CREATININE 1.10 11/23/2021   CALCIUM 7.8 (L) 08/03/2021   PROT 6.4 (L) 06/13/2021   ALBUMIN 3.6 06/13/2021   AST 22 06/13/2021   ALT 18 06/13/2021   ALKPHOS 83 06/13/2021   BILITOT 0.9 06/13/2021   GFRNONAA >60 08/03/2021   GFRAA >60 03/15/2020    Lab Results  Component Value Date   WBC 15.4 (H) 08/01/2021   NEUTROABS 12.6 (H) 08/01/2021   HGB 16.2 08/01/2021   HCT 47.2 08/01/2021   MCV 91.1 08/01/2021   PLT 195 08/01/2021     STUDIES: CT CHEST ABDOMEN PELVIS W CONTRAST  Result Date: 11/24/2021 CLINICAL DATA:  Metastatic lung cancer restaging EXAM: CT CHEST, ABDOMEN, AND PELVIS WITH CONTRAST TECHNIQUE: Multidetector CT imaging of the chest, abdomen and pelvis was performed following the standard protocol during bolus administration of intravenous contrast. RADIATION DOSE REDUCTION: This exam was performed according to the departmental dose-optimization program which includes automated exposure  control, adjustment of the mA and/or kV according to patient size and/or use of iterative reconstruction technique. CONTRAST:  19m OMNIPAQUE IOHEXOL 300 MG/ML SOLN, additional oral enteric contrast COMPARISON:  PET-CT, 08/17/2021, CT chest angiogram, 08/01/2021 FINDINGS: CT CHEST FINDINGS Cardiovascular: Aortic atherosclerosis. Normal heart size. Three-vessel coronary artery calcifications and stents. No pericardial effusion. Mediastinum/Nodes: No enlarged mediastinal, hilar, or axillary lymph nodes. Thyroid gland, trachea, and esophagus demonstrate no significant findings. Lungs/Pleura: Interval improvement in bilateral pleural effusions, small on the left, trace on the right. Unchanged post treatment appearance of the left upper lobe, with near complete consolidation and atelectasis and proximal obstruction of the upper lobe bronchus (series 4, image 66). Numerous new and enlarged small bilateral pulmonary nodules, for example a new nodule of the anterior right apex measuring 0.3  cm (series 4, image 31) a new nodule of the left lower lobe measuring 0.3 cm (series 4, image 52), and an enlarged nodule of the more inferior left lower lobe measuring 0.7 cm, previously 0.4 cm (series 4, image 63). There is extensive interlobular septal thickening throughout the right lung (series 4, image 124), somewhat increased compared to prior examination mild underlying emphysema. Musculoskeletal: No chest wall mass. Nonacute fracture of the superior sacral body (series 6, image 85). CT ABDOMEN PELVIS FINDINGS Hepatobiliary: No solid liver abnormality is seen. No gallstones, gallbladder wall thickening, or biliary dilatation. Pancreas: Unremarkable. No pancreatic ductal dilatation or surrounding inflammatory changes. Spleen: Normal in size without significant abnormality. Adrenals/Urinary Tract: Adrenal glands are unremarkable. Kidneys are normal, without renal calculi, solid lesion, or hydronephrosis. Bladder is unremarkable. Stomach/Bowel: Stomach is within normal limits. Appendix appears normal. No evidence of bowel wall thickening, distention, or inflammatory changes. Vascular/Lymphatic: Aortic atherosclerosis. No enlarged abdominal or pelvic lymph nodes. Reproductive: Prostatomegaly. Other: No abdominal wall hernia or abnormality. No ascites. Musculoskeletal: No acute osseous findings. Redemonstrated mixed lytic and sclerotic osseous metastatic disease with interval increase in size and sclerosis multiple lesions, particularly involving the sacrum and pelvis (series 2, image 91). IMPRESSION: 1. Numerous new and enlarged small bilateral pulmonary nodules, consistent with worsened pulmonary metastatic disease. 2. Extensive interlobular septal thickening throughout the right lung, somewhat increased, highly concerning for lymphangitic metastatic involvement. 3. Unchanged post treatment appearance of the left upper lobe, with near complete consolidation and atelectasis. 4. Interval improvement in bilateral  pleural effusions, small on the left, trace on the right. 5. Redemonstrated mixed lytic and sclerotic osseous metastatic disease with interval increase in size and sclerosis multiple lesions, particularly involving the sacrum and pelvis. This likely reflects a combination of osseous metastatic disease progression and post treatment sclerosis. 6. Coronary artery disease. Aortic Atherosclerosis (ICD10-I70.0). Electronically Signed   By: ADelanna AhmadiM.D.   On: 11/24/2021 10:07     ONCOLOGY HISTORY:  Biopsy of lung nodule on November 27, 2016 revealed adenocarcinoma, but there was no invasive component on the sample. Biopsy of mediastinal lymph node had insufficient material. Patient ultimately declined any further biopsies or surgery and elected to proceed with XRT alone which he completed in April 2018.  PET scan on January 22, 2019 reviewed independently with increased metabolic activity prior to previous with SUV increasing from 6.5 up to 8.8.  Patient declined intervention at that time and elected to proceed with simple observation.  Repeat CT scan on April 21, 2019 reviewed independently with progressive 6.4 cm mass in the left upper lobe.  Patient declined systemic treatment, but agreed to proceed with XRT which she completed in approximately August 2020.  CT scan on September 23, 2019 reviewed independently with no obvious evidence of recurrent or progressive disease.   ASSESSMENT:  Adenocarcinoma of upper lobe of left lung   PLAN:   1.  Adenocarcinoma of upper lobe of left lung: PET scan results from August 17, 2021 reviewed independently with interval continued progression of bony metastatic disease and a new hypermetabolic bone lesion in the right sacrum.  Bilateral pulmonary nodules were stable, but concerning for metastatic involvement.  At that time we discussed systemic treatment, but patient and his wife elected for continued active surveillance.  Previously he stated he would not want aggressive  chemotherapy but would consider immunotherapy if needed despite PD-L1 being unknown.  Repeat CT scan results from November 23, 2021 reviewed independently and report as above with continued progression of disease.  Patient has now agreed to systemic therapy and will return to clinic in 1 week to initiate cycle 1 of Keytruda.  Plan to repeat imaging in 3 months.     2.  Shoulder/back pain: Patient does not complain of this today.  He reports this has been evident for years/decades. Continue tramadol as needed. 3.  Shortness of breath/CHF: Patient does not complain of this today.  Follow-up with cardiology as scheduled.  Follow-up with cardiology as scheduled next week. 4.  Bony disease: Will initiate Zometa with odd-numbered cycles.  I spent a total of 30 minutes reviewing chart data, face-to-face evaluation with the patient, counseling and coordination of care as detailed above.   Patient expressed understanding and was in agreement with this plan. He also understands that He can call clinic at any time with any questions, concerns, or complaints.     [name], MD   11/28/2021 4:46 PM

## 2021-11-28 ENCOUNTER — Ambulatory Visit: Admission: RE | Admit: 2021-11-28 | Payer: Medicare HMO | Source: Ambulatory Visit | Admitting: Radiation Oncology

## 2021-11-28 ENCOUNTER — Other Ambulatory Visit: Payer: Self-pay

## 2021-11-28 ENCOUNTER — Inpatient Hospital Stay: Payer: Medicare HMO | Attending: Nurse Practitioner | Admitting: Oncology

## 2021-11-28 ENCOUNTER — Other Ambulatory Visit: Payer: Self-pay | Admitting: *Deleted

## 2021-11-28 VITALS — BP 147/69 | HR 62 | Resp 18 | Wt 132.6 lb

## 2021-11-28 DIAGNOSIS — I252 Old myocardial infarction: Secondary | ICD-10-CM | POA: Diagnosis not present

## 2021-11-28 DIAGNOSIS — C7951 Secondary malignant neoplasm of bone: Secondary | ICD-10-CM | POA: Insufficient documentation

## 2021-11-28 DIAGNOSIS — C3412 Malignant neoplasm of upper lobe, left bronchus or lung: Secondary | ICD-10-CM | POA: Insufficient documentation

## 2021-11-28 DIAGNOSIS — Z79899 Other long term (current) drug therapy: Secondary | ICD-10-CM | POA: Diagnosis not present

## 2021-11-28 DIAGNOSIS — Z5112 Encounter for antineoplastic immunotherapy: Secondary | ICD-10-CM | POA: Insufficient documentation

## 2021-11-28 DIAGNOSIS — Z87891 Personal history of nicotine dependence: Secondary | ICD-10-CM | POA: Insufficient documentation

## 2021-11-28 NOTE — Progress Notes (Signed)
START OFF PATHWAY REGIMEN - Non-Small Cell Lung   [id number]:Pembrolizumab 200 mg IV D1 q21 Days:   A cycle is every 21 days:     Pembrolizumab   **Always confirm dose/schedule in your pharmacy ordering system**  Patient Characteristics: Stage IV Metastatic, Nonsquamous, Did Not Order Molecular Analysis/Quantity Not Sufficient for Molecular Analysis Therapeutic Status: Stage IV Metastatic Histology: Nonsquamous Cell Broad Molecular Profiling Status: Quantity Not Sufficient for Molecular Analysis  Intent of Therapy: Non-Curative / Palliative Intent, Discussed with Patient

## 2021-12-01 ENCOUNTER — Inpatient Hospital Stay: Payer: Medicare HMO

## 2021-12-01 ENCOUNTER — Other Ambulatory Visit: Payer: Self-pay

## 2021-12-04 NOTE — Progress Notes (Signed)
[place]  [phone] [phone]  ID: Scott Gross OB: 1934-09-18  MR#: [id number]  NMM#:[id number]  Patient Care Team: [name], MD as PCP - General (Family Medicine) [name], MD as Consulting Physician (Oncology)   CHIEF COMPLAINT: Progressive adenocarcinoma of upper lobe of left lung.  INTERVAL HISTORY: Patient returns to clinic today for further evaluation and initiation of cycle 1 of Keytruda.  He currently feels well and is asymptomatic.  He is accompanied by his wife today.  He does not complain of any weakness or fatigue.  He denies pain.  He has a good appetite and denies weight loss.  He has no neurologic complaints. He denies any chest pain, shortness of breath, cough, or hemoptysis.  He has no nausea, vomiting, constipation, or diarrhea. He has no urinary complaints.  Patient offers no specific complaints today.  REVIEW OF SYSTEMS:   Review of Systems  Constitutional: Negative.  Negative for fever, malaise/fatigue and weight loss.  Respiratory: Negative.  Negative for cough, hemoptysis and shortness of breath.   Cardiovascular: Negative.  Negative for chest pain and leg swelling.  Gastrointestinal: Negative.  Negative for abdominal pain.  Genitourinary: Negative.  Negative for dysuria.  Musculoskeletal: Negative.  Negative for back pain and joint pain.  Skin: Negative.  Negative for rash.  Neurological: Negative.  Negative for sensory change, focal weakness, weakness and headaches.  Psychiatric/Behavioral: Negative.  The patient is not nervous/anxious.    As per HPI. Otherwise, a complete review of systems is negative.  PAST MEDICAL HISTORY: Past Medical History:  Diagnosis Date   Arthritis    CAD (coronary artery disease) Nov. 12, 2012   Inferior ST elevation MI in November of 2012 with ventricular fibrillation arrest. Status post drug-eluting stent placement to the mid LAD. Most recent cardiac  catheterization in June of this year showed patent stent with minimal restenosis, 75% proximal LAD and 80% distal LAD which were unchanged from before. Mild disease in the left circumflex and moderate RCA disease. Ejection fraction was 55%.   Cancer of upper lobe of left lung ([place]) 11/2016   Rad tx's.   Cardiac arrest [facility]) Nov. 2012   x 2    Cardiac arrest - ventricular fibrillation    HOH (hard of hearing)    Left Hearing Aid   Hyperlipidemia    Hypertension    Post PTCA 2013   x2   ST elevation MI (STEMI) ([place]) 08/26/2011    PAST SURGICAL HISTORY: Past Surgical History:  Procedure Laterality Date   CARDIAC CATHETERIZATION  2013   @ [place]; [place] s/p stent    CORONARY ANGIOPLASTY     CORONARY/GRAFT ACUTE MI REVASCULARIZATION N/A 06/06/2021   Procedure: Coronary/Graft Acute MI Revascularization;  Surgeon: Wellington Hampshire, MD;  Location: [place] [facility];  Service: Cardiovascular;  Laterality: N/A;   ELECTROMAGNETIC NAVIGATION BROCHOSCOPY N/A 11/27/2016   Procedure: ELECTROMAGNETIC NAVIGATION BRONCHOSCOPY;  Surgeon: [name], MD;  Location: [facility];  Service: Cardiopulmonary;  Laterality: N/A;   LEFT HEART CATH AND CORONARY ANGIOGRAPHY N/A 06/06/2021   Procedure: LEFT HEART CATH AND CORONARY ANGIOGRAPHY;  Surgeon: Wellington Hampshire, MD;  Location: [place] [facility];  Service: Cardiovascular;  Laterality: N/A;    FAMILY HISTORY: Family History  Problem Relation Age of Onset   Heart attack Mother    Heart attack Father     ADVANCED DIRECTIVES (Y/N):  N  HEALTH MAINTENANCE: Social History   Tobacco Use   Smoking status: Former  Packs/day: 1.00    Types: Cigarettes    Quit date: 08/27/2011    Years since quitting: 10.2   Smokeless tobacco: Never  Vaping Use   Vaping Use: Never used  Substance Use Topics   Alcohol use: No   Drug use: No     Colonoscopy:  PAP:  Bone density:  Lipid panel:  Allergies  Allergen Reactions   Statins Other (See  Comments)    Arthralagia    Current Outpatient Medications  Medication Sig Dispense Refill   albuterol (VENTOLIN HFA) 108 (90 Base) MCG/ACT inhaler Inhale into the lungs every 6 (six) hours as needed.     amiodarone (PACERONE) 200 MG tablet Take 1 tablet (200 mg total) by mouth 2 (two) times daily. (Patient taking differently: Take 200 mg by mouth daily.) 60 tablet 0   apixaban (ELIQUIS) 5 MG TABS tablet Take 1 tablet (5 mg total) by mouth 2 (two) times daily. 60 tablet 1   clopidogrel (PLAVIX) 75 MG tablet Take 75 mg by mouth daily.     losartan-hydrochlorothiazide (HYZAAR) 50-12.5 MG tablet Take 1 tablet by mouth daily.     metoprolol succinate (TOPROL-XL) 50 MG 24 hr tablet Take 50 mg by mouth daily.     rosuvastatin (CRESTOR) 5 MG tablet Take by mouth.     torsemide (DEMADEX) 20 MG tablet Take 1 tablet (20 mg total) by mouth daily. 30 tablet 0   amLODipine (NORVASC) 5 MG tablet Take by mouth. (Patient not taking: Reported on 11/28/2021)     potassium chloride SA (KLOR-CON) 20 MEQ tablet Take by mouth. (Patient not taking: Reported on 11/28/2021)     No current facility-administered medications for this visit.    OBJECTIVE: Vitals:   12/06/21 1100  BP: 109/60  Pulse: 65  Resp: 16  Temp: (!) 96.4 F (35.8 C)  SpO2: 99%     Body mass index is 17.88 kg/m.    ECOG FS:0 - Asymptomatic  General: Thin, no acute distress. Eyes: Pink conjunctiva, anicteric sclera. HEENT: Normocephalic, moist mucous membranes. Lungs: No audible wheezing or coughing. Heart: Regular rate and rhythm. Abdomen: Soft, nontender, no obvious distention. Musculoskeletal: No edema, cyanosis, or clubbing. Neuro: Alert, answering all questions appropriately. Cranial nerves grossly intact. Skin: No rashes or petechiae noted. Psych: Normal affect.  LAB RESULTS:  Lab Results  Component Value Date   NA 135 12/06/2021   K 2.8 (L) 12/06/2021   CL 89 (L) 12/06/2021   CO2 37 (H) 12/06/2021   GLUCOSE 121 (H)  12/06/2021   BUN 24 (H) 12/06/2021   CREATININE 1.20 12/06/2021   CALCIUM 9.2 12/06/2021   PROT 6.8 12/06/2021   ALBUMIN 3.8 12/06/2021   AST 26 12/06/2021   ALT 13 12/06/2021   ALKPHOS 181 (H) 12/06/2021   BILITOT 0.7 12/06/2021   GFRNONAA 59 (L) 12/06/2021   GFRAA >60 03/15/2020    Lab Results  Component Value Date   WBC 11.7 (H) 12/06/2021   NEUTROABS 8.6 (H) 12/06/2021   HGB 14.1 12/06/2021   HCT 41.9 12/06/2021   MCV 92.5 12/06/2021   PLT 244 12/06/2021     STUDIES: CT CHEST ABDOMEN PELVIS W CONTRAST  Result Date: 11/24/2021 CLINICAL DATA:  Metastatic lung cancer restaging EXAM: CT CHEST, ABDOMEN, AND PELVIS WITH CONTRAST TECHNIQUE: Multidetector CT imaging of the chest, abdomen and pelvis was performed following the standard protocol during bolus administration of intravenous contrast. RADIATION DOSE REDUCTION: This exam was performed according to the departmental dose-optimization program which includes automated  exposure control, adjustment of the mA and/or kV according to patient size and/or use of iterative reconstruction technique. CONTRAST:  135m OMNIPAQUE IOHEXOL 300 MG/ML SOLN, additional oral enteric contrast COMPARISON:  PET-CT, 08/17/2021, CT chest angiogram, 08/01/2021 FINDINGS: CT CHEST FINDINGS Cardiovascular: Aortic atherosclerosis. Normal heart size. Three-vessel coronary artery calcifications and stents. No pericardial effusion. Mediastinum/Nodes: No enlarged mediastinal, hilar, or axillary lymph nodes. Thyroid gland, trachea, and esophagus demonstrate no significant findings. Lungs/Pleura: Interval improvement in bilateral pleural effusions, small on the left, trace on the right. Unchanged post treatment appearance of the left upper lobe, with near complete consolidation and atelectasis and proximal obstruction of the upper lobe bronchus (series 4, image 66). Numerous new and enlarged small bilateral pulmonary nodules, for example a new nodule of the anterior  right apex measuring 0.3 cm (series 4, image 31) a new nodule of the left lower lobe measuring 0.3 cm (series 4, image 52), and an enlarged nodule of the more inferior left lower lobe measuring 0.7 cm, previously 0.4 cm (series 4, image 63). There is extensive interlobular septal thickening throughout the right lung (series 4, image 124), somewhat increased compared to prior examination mild underlying emphysema. Musculoskeletal: No chest wall mass. Nonacute fracture of the superior sacral body (series 6, image 85). CT ABDOMEN PELVIS FINDINGS Hepatobiliary: No solid liver abnormality is seen. No gallstones, gallbladder wall thickening, or biliary dilatation. Pancreas: Unremarkable. No pancreatic ductal dilatation or surrounding inflammatory changes. Spleen: Normal in size without significant abnormality. Adrenals/Urinary Tract: Adrenal glands are unremarkable. Kidneys are normal, without renal calculi, solid lesion, or hydronephrosis. Bladder is unremarkable. Stomach/Bowel: Stomach is within normal limits. Appendix appears normal. No evidence of bowel wall thickening, distention, or inflammatory changes. Vascular/Lymphatic: Aortic atherosclerosis. No enlarged abdominal or pelvic lymph nodes. Reproductive: Prostatomegaly. Other: No abdominal wall hernia or abnormality. No ascites. Musculoskeletal: No acute osseous findings. Redemonstrated mixed lytic and sclerotic osseous metastatic disease with interval increase in size and sclerosis multiple lesions, particularly involving the sacrum and pelvis (series 2, image 91). IMPRESSION: 1. Numerous new and enlarged small bilateral pulmonary nodules, consistent with worsened pulmonary metastatic disease. 2. Extensive interlobular septal thickening throughout the right lung, somewhat increased, highly concerning for lymphangitic metastatic involvement. 3. Unchanged post treatment appearance of the left upper lobe, with near complete consolidation and atelectasis. 4. Interval  improvement in bilateral pleural effusions, small on the left, trace on the right. 5. Redemonstrated mixed lytic and sclerotic osseous metastatic disease with interval increase in size and sclerosis multiple lesions, particularly involving the sacrum and pelvis. This likely reflects a combination of osseous metastatic disease progression and post treatment sclerosis. 6. Coronary artery disease. Aortic Atherosclerosis (ICD10-I70.0). Electronically Signed   By: ADelanna AhmadiM.D.   On: 11/24/2021 10:07     ONCOLOGY HISTORY:  Biopsy of lung nodule on November 27, 2016 revealed adenocarcinoma, but there was no invasive component on the sample. Biopsy of mediastinal lymph node had insufficient material. Patient ultimately declined any further biopsies or surgery and elected to proceed with XRT alone which he completed in April 2018.  PET scan on January 22, 2019 reviewed independently with increased metabolic activity prior to previous with SUV increasing from 6.5 up to 8.8.  Patient declined intervention at that time and elected to proceed with simple observation.  Repeat CT scan on April 21, 2019 reviewed independently with progressive 6.4 cm mass in the left upper lobe.  Patient declined systemic treatment, but agreed to proceed with XRT which she completed in approximately August 2020.  CT scan on September 23, 2019 reviewed independently with no obvious evidence of recurrent or progressive disease.   ASSESSMENT: Progressive adenocarcinoma of upper lobe of left lung   PLAN:   1.  Progressive adenocarcinoma of upper lobe of left lung: PET scan results from August 17, 2021 reviewed independently with interval continued progression of bony metastatic disease and a new hypermetabolic bone lesion in the right sacrum.  Bilateral pulmonary nodules were stable, but concerning for metastatic involvement.  At that time we discussed systemic treatment, but patient and his wife elected for continued active surveillance.   Previously he stated he would not want aggressive chemotherapy but would consider immunotherapy if needed despite PD-L1 being unknown.  Repeat CT scan results from November 23, 2021 reviewed independently and reported as above with continued progression of disease.  Patient has now agreed to systemic therapy.  Proceed with cycle 1 of Keytruda today.  Return to clinic in 3 weeks for further evaluation and consideration of cycle 2.  Plan to reimage in May or June 2023.     2.  Shoulder/back pain: Patient does not complain of this today.  He reports this has been evident for years/decades. Continue tramadol as needed. 3.  Shortness of breath/CHF: Patient does not complain of this today.  Follow-up with cardiology as scheduled.  Follow-up with cardiology as scheduled next week. 4.  Bony disease: Will initiate Zometa with odd-numbered cycles.  I spent a total of 30 minutes reviewing chart data, face-to-face evaluation with the patient, counseling and coordination of care as detailed above.    Patient expressed understanding and was in agreement with this plan. He also understands that He can call clinic at any time with any questions, concerns, or complaints.     [name], MD   12/07/2021 11:24 AM

## 2021-12-06 ENCOUNTER — Ambulatory Visit: Payer: Medicare HMO

## 2021-12-06 ENCOUNTER — Inpatient Hospital Stay: Payer: Medicare HMO

## 2021-12-06 ENCOUNTER — Inpatient Hospital Stay (HOSPITAL_BASED_OUTPATIENT_CLINIC_OR_DEPARTMENT_OTHER): Payer: Medicare HMO | Admitting: Hospice and Palliative Medicine

## 2021-12-06 ENCOUNTER — Inpatient Hospital Stay (HOSPITAL_BASED_OUTPATIENT_CLINIC_OR_DEPARTMENT_OTHER): Payer: Medicare HMO | Admitting: Oncology

## 2021-12-06 ENCOUNTER — Other Ambulatory Visit: Payer: Self-pay

## 2021-12-06 VITALS — BP 109/60 | HR 65 | Temp 96.4°F | Resp 16 | Wt 128.2 lb

## 2021-12-06 VITALS — Temp 96.0°F

## 2021-12-06 DIAGNOSIS — I252 Old myocardial infarction: Secondary | ICD-10-CM | POA: Diagnosis not present

## 2021-12-06 DIAGNOSIS — C3412 Malignant neoplasm of upper lobe, left bronchus or lung: Secondary | ICD-10-CM

## 2021-12-06 DIAGNOSIS — Z87891 Personal history of nicotine dependence: Secondary | ICD-10-CM | POA: Diagnosis not present

## 2021-12-06 DIAGNOSIS — Z79899 Other long term (current) drug therapy: Secondary | ICD-10-CM | POA: Diagnosis not present

## 2021-12-06 DIAGNOSIS — Z515 Encounter for palliative care: Secondary | ICD-10-CM | POA: Diagnosis not present

## 2021-12-06 DIAGNOSIS — C7951 Secondary malignant neoplasm of bone: Secondary | ICD-10-CM | POA: Diagnosis not present

## 2021-12-06 DIAGNOSIS — Z5112 Encounter for antineoplastic immunotherapy: Secondary | ICD-10-CM | POA: Diagnosis not present

## 2021-12-06 LAB — COMPREHENSIVE METABOLIC PANEL
ALT: 13 U/L (ref 0–44)
AST: 26 U/L (ref 15–41)
Albumin: 3.8 g/dL (ref 3.5–5.0)
Alkaline Phosphatase: 181 U/L — ABNORMAL HIGH (ref 38–126)
Anion gap: 9 (ref 5–15)
BUN: 24 mg/dL — ABNORMAL HIGH (ref 8–23)
CO2: 37 mmol/L — ABNORMAL HIGH (ref 22–32)
Calcium: 9.2 mg/dL (ref 8.9–10.3)
Chloride: 89 mmol/L — ABNORMAL LOW (ref 98–111)
Creatinine, Ser: 1.2 mg/dL (ref 0.61–1.24)
GFR, Estimated: 59 mL/min — ABNORMAL LOW (ref >60)
Glucose, Bld: 121 mg/dL — ABNORMAL HIGH (ref 70–99)
Potassium: 2.8 mmol/L — ABNORMAL LOW (ref 3.5–5.1)
Sodium: 135 mmol/L (ref 135–145)
Total Bilirubin: 0.7 mg/dL (ref 0.3–1.2)
Total Protein: 6.8 g/dL (ref 6.5–8.1)

## 2021-12-06 LAB — CBC WITH DIFFERENTIAL/PLATELET
Abs Immature Granulocytes: 0.05 10*3/uL (ref 0.00–0.07)
Basophils Absolute: 0 10*3/uL (ref 0.0–0.1)
Basophils Relative: 0 %
Eosinophils Absolute: 0.2 10*3/uL (ref 0.0–0.5)
Eosinophils Relative: 2 %
HCT: 41.9 % (ref 39.0–52.0)
Hemoglobin: 14.1 g/dL (ref 13.0–17.0)
Immature Granulocytes: 0 %
Lymphocytes Relative: 13 %
Lymphs Abs: 1.5 10*3/uL (ref 0.7–4.0)
MCH: 31.1 pg (ref 26.0–34.0)
MCHC: 33.7 g/dL (ref 30.0–36.0)
MCV: 92.5 fL (ref 80.0–100.0)
Monocytes Absolute: 1.3 10*3/uL — ABNORMAL HIGH (ref 0.1–1.0)
Monocytes Relative: 11 %
Neutro Abs: 8.6 10*3/uL — ABNORMAL HIGH (ref 1.7–7.7)
Neutrophils Relative %: 74 %
Platelets: 244 10*3/uL (ref 150–400)
RBC: 4.53 MIL/uL (ref 4.22–5.81)
RDW: 13.2 % (ref 11.5–15.5)
WBC: 11.7 10*3/uL — ABNORMAL HIGH (ref 4.0–10.5)
nRBC: 0 % (ref 0.0–0.2)

## 2021-12-06 LAB — TSH: TSH: 2.147 u[IU]/mL (ref 0.350–4.500)

## 2021-12-06 MED ORDER — SODIUM CHLORIDE 0.9 % IV SOLN
200.0000 mg | Freq: Once | INTRAVENOUS | Status: AC
  Administered 2021-12-06: 200 mg via INTRAVENOUS
  Filled 2021-12-06: qty 200

## 2021-12-06 MED ORDER — POTASSIUM CHLORIDE 20 MEQ/100ML IV SOLN: 20.0000 meq | Freq: Once | INTRAVENOUS | Status: DC

## 2021-12-06 MED ORDER — SODIUM CHLORIDE 0.9 % IV SOLN
Freq: Once | INTRAVENOUS | Status: AC
  Filled 2021-12-06: qty 250

## 2021-12-06 MED ORDER — POTASSIUM CHLORIDE IN NACL 20-0.9 MEQ/L-% IV SOLN
Freq: Once | INTRAVENOUS | Status: AC
  Filled 2021-12-06: qty 1000

## 2021-12-06 MED ORDER — ZOLEDRONIC ACID 4 MG/5ML IV CONC
3.0000 mg | Freq: Once | INTRAVENOUS | Status: AC
  Administered 2021-12-06: 3 mg via INTRAVENOUS
  Filled 2021-12-06: qty 3.75

## 2021-12-06 MED ORDER — SODIUM CHLORIDE 0.9 % IV SOLN
Freq: Once | INTRAVENOUS | Status: DC
  Filled 2021-12-06: qty 250

## 2021-12-06 NOTE — Progress Notes (Signed)
[place] at [facility] [phone] [phone]   Name: Scott Gross Date: 12/06/2021 MRN: [id number]  DOB: December 16, 1933  Patient Care Team: [name], MD as PCP - General (Family Medicine) [name], MD as Consulting Physician (Oncology)    REASON FOR CONSULTATION: Scott Gross is a 88 y.o. 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  He was referred to palliative care to help address goals and manage ongoing symptoms.  SOCIAL HISTORY:     reports that he quit smoking about 10 years ago. His smoking use included cigarettes. He smoked an average of 1 pack per day. He has never used smokeless tobacco. He reports that he does not drink alcohol and does not use drugs.  Patient is married and lives at home with his wife.  He has a daughter who is involved.  ADVANCE DIRECTIVES:  None on file  CODE STATUS:   PAST MEDICAL HISTORY: Past Medical History:  Diagnosis Date   Arthritis    CAD (coronary artery disease) Nov. 12, 2012   Inferior ST elevation MI in November of 2012 with ventricular fibrillation arrest. Status post drug-eluting stent placement to the mid LAD. Most recent cardiac catheterization in June of this year showed patent stent with minimal restenosis, 75% proximal LAD and 80% distal LAD which were unchanged from before. Mild disease in the left circumflex and moderate RCA disease. Ejection fraction was 55%.   Cancer of upper lobe of left lung ([place]) 11/2016   Rad tx's.   Cardiac arrest [facility]) Nov. 2012   x 2    Cardiac arrest - ventricular fibrillation    HOH (hard of hearing)    Left Hearing Aid   Hyperlipidemia    Hypertension    Post PTCA 2013   x2   ST elevation MI (STEMI) ([place]) 08/26/2011    PAST SURGICAL HISTORY:  Past Surgical History:  Procedure  Laterality Date   CARDIAC CATHETERIZATION  2013   @ [place]; [place] s/p stent    CORONARY ANGIOPLASTY     CORONARY/GRAFT ACUTE MI REVASCULARIZATION N/A 06/06/2021   Procedure: Coronary/Graft Acute MI Revascularization;  Surgeon: Wellington Hampshire, MD;  Location: [place] [facility];  Service: Cardiovascular;  Laterality: N/A;   ELECTROMAGNETIC NAVIGATION BROCHOSCOPY N/A 11/27/2016   Procedure: ELECTROMAGNETIC NAVIGATION BRONCHOSCOPY;  Surgeon: [name], MD;  Location: [facility] OR[facility];  Service: Cardiopulmonary;  Laterality: N/A;   LEFT HEART CATH AND CORONARY ANGIOGRAPHY N/A 06/06/2021   Procedure: LEFT HEART CATH AND CORONARY ANGIOGRAPHY;  Surgeon: Wellington Hampshire, MD;  Location: [place] [facility];  Service: Cardiovascular;  Laterality: N/A;    HEMATOLOGY/ONCOLOGY HISTORY:  Oncology History  Cancer of upper lobe of left lung ([place])  09/10/2016 Initial Diagnosis   Cancer of upper lobe of left lung ([place])   12/06/2021 -  Chemotherapy   Patient is on Treatment Plan : LUNG NSCLC Pembrolizumab (200) q21d       ALLERGIES:  is allergic to statins.  MEDICATIONS:  Current Outpatient Medications  Medication Sig Dispense Refill   albuterol (VENTOLIN HFA) 108 (90 Base) MCG/ACT inhaler Inhale into the lungs every 6 (six) hours as needed.     amiodarone (PACERONE) 200 MG tablet Take 1 tablet (200 mg total) by mouth 2 (two) times daily. (Patient taking differently: Take 200 mg by mouth daily.) 60 tablet 0  amLODipine (NORVASC) 5 MG tablet Take by mouth. (Patient not taking: Reported on 11/28/2021)     apixaban (ELIQUIS) 5 MG TABS tablet Take 1 tablet (5 mg total) by mouth 2 (two) times daily. 60 tablet 1   clopidogrel (PLAVIX) 75 MG tablet Take 75 mg by mouth daily.     losartan-hydrochlorothiazide (HYZAAR) 50-12.5 MG tablet Take 1 tablet by mouth daily.     metoprolol succinate (TOPROL-XL) 50 MG 24 hr tablet Take 50 mg by mouth daily.     potassium chloride SA (KLOR-CON) 20 MEQ tablet Take by mouth.  (Patient not taking: Reported on 11/28/2021)     rosuvastatin (CRESTOR) 5 MG tablet Take by mouth.     torsemide (DEMADEX) 20 MG tablet Take 1 tablet (20 mg total) by mouth daily. 30 tablet 0   No current facility-administered medications for this visit.   Facility-Administered Medications Ordered in Other Visits  Medication Dose Route Frequency Provider Last Rate Last Admin   0.9 %  sodium chloride infusion   Intravenous Once Grayland Ormond, Kathlene November, MD       pembrolizumab [facility]) 200 mg in sodium chloride 0.9 % 50 mL chemo infusion  200 mg Intravenous Once [name], MD       zoledronic acid (ZOMETA) 3 mg in sodium chloride 0.9 % 100 mL IVPB  3 mg Intravenous Once [name], MD        VITAL SIGNS: There were no vitals taken for this visit. Filed Weights    Estimated body mass index is 17.88 kg/m as calculated from the following:   Height as of 08/01/21: '5\' 11"'  (1.803 m).   Weight as of an earlier encounter on 12/06/21: 128 lb 3.2 oz (58.2 kg).  LABS: CBC:    Component Value Date/Time   WBC 11.7 (H) 12/06/2021 1016   HGB 14.1 12/06/2021 1016   HGB 17.0 11/28/2013 0605   HCT 41.9 12/06/2021 1016   HCT 51.4 11/28/2013 0605   PLT 244 12/06/2021 1016   PLT 167 11/28/2013 0605   MCV 92.5 12/06/2021 1016   MCV 92 11/28/2013 0605   NEUTROABS 8.6 (H) 12/06/2021 1016   NEUTROABS 6.0 11/28/2013 0605   LYMPHSABS 1.5 12/06/2021 1016   LYMPHSABS 4.7 (H) 11/28/2013 0605   MONOABS 1.3 (H) 12/06/2021 1016   MONOABS 0.8 11/28/2013 0605   EOSABS 0.2 12/06/2021 1016   EOSABS 0.3 11/28/2013 0605   BASOSABS 0.0 12/06/2021 1016   BASOSABS 0.1 11/28/2013 0605   Comprehensive Metabolic Panel:    Component Value Date/Time   NA 135 12/06/2021 1016   NA 137 11/28/2013 0605   K 2.8 (L) 12/06/2021 1016   K 3.6 11/28/2013 0605   CL 89 (L) 12/06/2021 1016   CL 105 11/28/2013 0605   CO2 37 (H) 12/06/2021 1016   CO2 29 11/28/2013 0605   BUN 24 (H) 12/06/2021 1016   BUN 9  11/28/2013 0605   CREATININE 1.20 12/06/2021 1016   CREATININE 1.07 11/28/2013 0605   GLUCOSE 121 (H) 12/06/2021 1016   GLUCOSE 153 (H) 11/28/2013 0605   CALCIUM 9.2 12/06/2021 1016   CALCIUM 8.7 11/28/2013 0605   AST 26 12/06/2021 1016   AST 33 11/28/2013 0605   ALT 13 12/06/2021 1016   ALT 36 11/28/2013 0605   ALKPHOS 181 (H) 12/06/2021 1016   ALKPHOS 99 11/28/2013 0605   BILITOT 0.7 12/06/2021 1016   BILITOT 0.6 11/28/2013 0605   PROT 6.8 12/06/2021 1016   PROT 7.4 11/28/2013 2025  ALBUMIN 3.8 12/06/2021 1016   ALBUMIN 3.9 11/28/2013 8676    RADIOGRAPHIC STUDIES: CT CHEST ABDOMEN PELVIS W CONTRAST  Result Date: 11/24/2021 CLINICAL DATA:  Metastatic lung cancer restaging EXAM: CT CHEST, ABDOMEN, AND PELVIS WITH CONTRAST TECHNIQUE: Multidetector CT imaging of the chest, abdomen and pelvis was performed following the standard protocol during bolus administration of intravenous contrast. RADIATION DOSE REDUCTION: This exam was performed according to the departmental dose-optimization program which includes automated exposure control, adjustment of the mA and/or kV according to patient size and/or use of iterative reconstruction technique. CONTRAST:  145m OMNIPAQUE IOHEXOL 300 MG/ML SOLN, additional oral enteric contrast COMPARISON:  PET-CT, 08/17/2021, CT chest angiogram, 08/01/2021 FINDINGS: CT CHEST FINDINGS Cardiovascular: Aortic atherosclerosis. Normal heart size. Three-vessel coronary artery calcifications and stents. No pericardial effusion. Mediastinum/Nodes: No enlarged mediastinal, hilar, or axillary lymph nodes. Thyroid gland, trachea, and esophagus demonstrate no significant findings. Lungs/Pleura: Interval improvement in bilateral pleural effusions, small on the left, trace on the right. Unchanged post treatment appearance of the left upper lobe, with near complete consolidation and atelectasis and proximal obstruction of the upper lobe bronchus (series 4, image 66). Numerous new  and enlarged small bilateral pulmonary nodules, for example a new nodule of the anterior right apex measuring 0.3 cm (series 4, image 31) a new nodule of the left lower lobe measuring 0.3 cm (series 4, image 52), and an enlarged nodule of the more inferior left lower lobe measuring 0.7 cm, previously 0.4 cm (series 4, image 63). There is extensive interlobular septal thickening throughout the right lung (series 4, image 124), somewhat increased compared to prior examination mild underlying emphysema. Musculoskeletal: No chest wall mass. Nonacute fracture of the superior sacral body (series 6, image 85). CT ABDOMEN PELVIS FINDINGS Hepatobiliary: No solid liver abnormality is seen. No gallstones, gallbladder wall thickening, or biliary dilatation. Pancreas: Unremarkable. No pancreatic ductal dilatation or surrounding inflammatory changes. Spleen: Normal in size without significant abnormality. Adrenals/Urinary Tract: Adrenal glands are unremarkable. Kidneys are normal, without renal calculi, solid lesion, or hydronephrosis. Bladder is unremarkable. Stomach/Bowel: Stomach is within normal limits. Appendix appears normal. No evidence of bowel wall thickening, distention, or inflammatory changes. Vascular/Lymphatic: Aortic atherosclerosis. No enlarged abdominal or pelvic lymph nodes. Reproductive: Prostatomegaly. Other: No abdominal wall hernia or abnormality. No ascites. Musculoskeletal: No acute osseous findings. Redemonstrated mixed lytic and sclerotic osseous metastatic disease with interval increase in size and sclerosis multiple lesions, particularly involving the sacrum and pelvis (series 2, image 91). IMPRESSION: 1. Numerous new and enlarged small bilateral pulmonary nodules, consistent with worsened pulmonary metastatic disease. 2. Extensive interlobular septal thickening throughout the right lung, somewhat increased, highly concerning for lymphangitic metastatic involvement. 3. Unchanged post treatment  appearance of the left upper lobe, with near complete consolidation and atelectasis. 4. Interval improvement in bilateral pleural effusions, small on the left, trace on the right. 5. Redemonstrated mixed lytic and sclerotic osseous metastatic disease with interval increase in size and sclerosis multiple lesions, particularly involving the sacrum and pelvis. This likely reflects a combination of osseous metastatic disease progression and post treatment sclerosis. 6. Coronary artery disease. Aortic Atherosclerosis (ICD10-I70.0). Electronically Signed   By: ADelanna AhmadiM.D.   On: 11/24/2021 10:07    PERFORMANCE STATUS (ECOG) : 2  Review of Systems Unless otherwise noted, a complete review of systems is negative.  Physical Exam General: NAD, thin, frail-appearing Pulmonary: Unlabored Extremities: no edema, no joint deformities Skin: no rashes Neurological: Weakness but otherwise nonfocal  IMPRESSION: I met with patient in the  infusion area.  Communicating with him in that setting was challenged by his hearing deficits.  However, I attempted to introduce palliative care.  Patient reports that he noticed that he has progressive cancer but is hopeful that he will respond to treatments.  He is currently on immunotherapy and has previously declined aggressive chemotherapy.  Patient denies significant symptomatic burden.  He is comfortable appearing.  He reports good social support with his family.  PLAN: -Continue current scope of treatment -He will benefit from conversation regarding advance directives -RTC 1 month  Case and plan discussed with Dr. Grayland Ormond  Patient expressed understanding and was in agreement with this plan. He also understands that He can call the clinic at any time with any questions, concerns, or complaints.     Time Total: 15 minutes  Visit consisted of counseling and education dealing with the complex and emotionally intense issues of symptom management and  palliative care in the setting of serious and potentially life-threatening illness.Greater than 50%  of this time was spent counseling and coordinating care related to the above assessment and plan.  Signed by: Altha Harm, PhD, NP-C

## 2021-12-06 NOTE — Progress Notes (Signed)
1240: Per Gust Rung RN per Dr. Grayland Ormond proceed with Zometa and [facility] as scheduled, potassium is 2.8, pt to receive 20 MEQs in 1000 ml NS over 2 hours. Per Tiffany RN per Dr. Grayland Ormond give Potassium prior to zometa and Keytruda.

## 2021-12-06 NOTE — Patient Instructions (Signed)
[facility] CANCER CTR AT [place]  Discharge Instructions: Thank you for choosing [facility] to provide your oncology and hematology care.  If you have a lab appointment with the [facility], please go directly to the [facility] and check in at the registration area.  Wear comfortable clothing and clothing appropriate for easy access to any Portacath or PICC line.   We strive to give you quality time with your provider. You may need to reschedule your appointment if you arrive late (15 or more minutes).  Arriving late affects you and other patients whose appointments are after yours.  Also, if you miss three or more appointments without notifying the office, you may be dismissed from the clinic at the providers discretion.      For prescription refill requests, have your pharmacy contact our office and allow 72 hours for refills to be completed.    Today you received the following chemotherapy and/or immunotherapy agents Keytruda       To help prevent nausea and vomiting after your treatment, we encourage you to take your nausea medication as directed.  BELOW ARE SYMPTOMS THAT SHOULD BE REPORTED IMMEDIATELY: *FEVER GREATER THAN 100.4 F (38 C) OR HIGHER *CHILLS OR SWEATING *NAUSEA AND VOMITING THAT IS NOT CONTROLLED WITH YOUR NAUSEA MEDICATION *UNUSUAL SHORTNESS OF BREATH *UNUSUAL BRUISING OR BLEEDING *URINARY PROBLEMS (pain or burning when urinating, or frequent urination) *BOWEL PROBLEMS (unusual diarrhea, constipation, pain near the anus) TENDERNESS IN MOUTH AND THROAT WITH OR WITHOUT PRESENCE OF ULCERS (sore throat, sores in mouth, or a toothache) UNUSUAL RASH, SWELLING OR PAIN  UNUSUAL VAGINAL DISCHARGE OR ITCHING   Items with * indicate a potential emergency and should be followed up as soon as possible or go to the Emergency Department if any problems should occur.  Please show the CHEMOTHERAPY ALERT CARD or IMMUNOTHERAPY ALERT CARD at check-in to  the Emergency Department and triage nurse.  Should you have questions after your visit or need to cancel or reschedule your appointment, please contact [facility] [place] AT [place]  [phone] and follow the prompts.  Office hours are 8:00 a.m. to 4:30 p.m. Monday - Friday. Please note that voicemails left after 4:00 p.m. may not be returned until the following business day.  We are closed weekends and major holidays. You have access to a nurse at all times for urgent questions. Please call the main number to the clinic [phone] and follow the prompts.  For any non-urgent questions, you may also contact your provider using MyChart. We now offer e-Visits for anyone 80 and older to request care online for non-urgent symptoms. For details visit [email or web address].   Also download the MyChart app! Go to the app store, search "MyChart", open the app, select [facility], and log in with your MyChart username and password.  Due to Covid, a mask is required upon entering the hospital/clinic. If you do not have a mask, one will be given to you upon arrival. For doctor visits, patients may have 1 support person aged 72 or older with them. For treatment visits, patients cannot have anyone with them due to current Covid guidelines and our immunocompromised population.    Pembrolizumab injection What is this medication? PEMBROLIZUMAB (pem broe liz ue mab) is a monoclonal antibody. It is used to treat certain types of cancer. This medicine may be used for other purposes; ask your health care provider or pharmacist if you have questions. COMMON BRAND NAME(S): Keytruda What should I tell my  care team before I take this medication? They need to know if you have any of these conditions: autoimmune diseases like Crohn's disease, ulcerative colitis, or lupus have had or planning to have an allogeneic stem cell transplant (uses someone else's stem cells) history of organ transplant history  of chest radiation nervous system problems like myasthenia gravis or Guillain-Barre syndrome an unusual or allergic reaction to pembrolizumab, other medicines, foods, dyes, or preservatives pregnant or trying to get pregnant breast-feeding How should I use this medication? This medicine is for infusion into a vein. It is given by a health care professional in a hospital or clinic setting. A special MedGuide will be given to you before each treatment. Be sure to read this information carefully each time. Talk to your pediatrician regarding the use of this medicine in children. While this drug may be prescribed for children as young as 6 months for selected conditions, precautions do apply. Overdosage: If you think you have taken too much of this medicine contact a poison control center or emergency room at once. NOTE: This medicine is only for you. Do not share this medicine with others. What if I miss a dose? It is important not to miss your dose. Call your doctor or health care professional if you are unable to keep an appointment. What may interact with this medication? Interactions have not been studied. This list may not describe all possible interactions. Give your health care provider a list of all the medicines, herbs, non-prescription drugs, or dietary supplements you use. Also tell them if you smoke, drink alcohol, or use illegal drugs. Some items may interact with your medicine. What should I watch for while using this medication? Your condition will be monitored carefully while you are receiving this medicine. You may need blood work done while you are taking this medicine. Do not become pregnant while taking this medicine or for 4 months after stopping it. Women should inform their doctor if they wish to become pregnant or think they might be pregnant. There is a potential for serious side effects to an unborn child. Talk to your health care professional or pharmacist for more  information. Do not breast-feed an infant while taking this medicine or for 4 months after the last dose. What side effects may I notice from receiving this medication? Side effects that you should report to your doctor or health care professional as soon as possible: allergic reactions like skin rash, itching or hives, swelling of the face, lips, or tongue bloody or black, tarry breathing problems changes in vision chest pain chills confusion constipation cough diarrhea dizziness or feeling faint or lightheaded fast or irregular heartbeat fever flushing joint pain low blood counts - this medicine may decrease the number of white blood cells, red blood cells and platelets. You may be at increased risk for infections and bleeding. muscle pain muscle weakness pain, tingling, numbness in the hands or feet persistent headache redness, blistering, peeling or loosening of the skin, including inside the mouth signs and symptoms of high blood sugar such as dizziness; dry mouth; dry skin; fruity breath; nausea; stomach pain; increased hunger or thirst; increased urination signs and symptoms of kidney injury like trouble passing urine or change in the amount of urine signs and symptoms of liver injury like dark urine, light-colored stools, loss of appetite, nausea, right upper belly pain, yellowing of the eyes or skin sweating swollen lymph nodes weight loss Side effects that usually do not require medical attention (report to your  doctor or health care professional if they continue or are bothersome): decreased appetite hair loss tiredness This list may not describe all possible side effects. Call your doctor for medical advice about side effects. You may report side effects to FDA at 1-800-FDA-1088. Where should I keep my medication? This drug is given in a hospital or clinic and will not be stored at home. NOTE: This sheet is a summary. It may not cover all possible information. If you  have questions about this medicine, talk to your doctor, pharmacist, or health care provider.  2022 Elsevier/Gold Standard (2021-06-20 00:00:00)   Zoledronic Acid Injection (Hypercalcemia, Oncology) What is this medication? ZOLEDRONIC ACID (ZOE le dron ik AS id) slows calcium loss from bones. It high calcium levels in the blood from some kinds of cancer. It may be used in other people at risk for bone loss. This medicine may be used for other purposes; ask your health care provider or pharmacist if you have questions. COMMON BRAND NAME(S): Zometa What should I tell my care team before I take this medication? They need to know if you have any of these conditions: cancer dehydration dental disease kidney disease liver disease low levels of calcium in the blood lung or breathing disease (asthma) receiving steroids like dexamethasone or prednisone an unusual or allergic reaction to zoledronic acid, other medicines, foods, dyes, or preservatives pregnant or trying to get pregnant breast-feeding How should I use this medication? This drug is injected into a vein. It is given by a health care provider in a hospital or clinic setting. Talk to your health care provider about the use of this drug in children. Special care may be needed. Overdosage: If you think you have taken too much of this medicine contact a poison control center or emergency room at once. NOTE: This medicine is only for you. Do not share this medicine with others. What if I miss a dose? Keep appointments for follow-up doses. It is important not to miss your dose. Call your health care provider if you are unable to keep an appointment. What may interact with this medication? certain antibiotics given by injection NSAIDs, medicines for pain and inflammation, like ibuprofen or naproxen some diuretics like bumetanide, furosemide teriparatide thalidomide This list may not describe all possible interactions. Give your health  care provider a list of all the medicines, herbs, non-prescription drugs, or dietary supplements you use. Also tell them if you smoke, drink alcohol, or use illegal drugs. Some items may interact with your medicine. What should I watch for while using this medication? Visit your health care provider for regular checks on your progress. It may be some time before you see the benefit from this drug. Some people who take this drug have severe bone, joint, or muscle pain. This drug may also increase your risk for jaw problems or a broken thigh bone. Tell your health care provider right away if you have severe pain in your jaw, bones, joints, or muscles. Tell you health care provider if you have any pain that does not go away or that gets worse. Tell your dentist and dental surgeon that you are taking this drug. You should not have major dental surgery while on this drug. See your dentist to have a dental exam and fix any dental problems before starting this drug. Take good care of your teeth while on this drug. Make sure you see your dentist for regular follow-up appointments. You should make sure you get enough calcium and vitamin  D while you are taking this drug. Discuss the foods you eat and the vitamins you take with your health care provider. Check with your health care provider if you have severe diarrhea, nausea, and vomiting, or if you sweat a lot. The loss of too much body fluid may make it dangerous for you to take this drug. You may need blood work done while you are taking this drug. Do not become pregnant while taking this drug. Women should inform their health care provider if they wish to become pregnant or think they might be pregnant. There is potential for serious harm to an unborn child. Talk to your health care provider for more information. What side effects may I notice from receiving this medication? Side effects that you should report to your doctor or health care provider as soon as  possible: allergic reactions (skin rash, itching or hives; swelling of the face, lips, or tongue) bone pain infection (fever, chills, cough, sore throat, pain or trouble passing urine) jaw pain, especially after dental work joint pain kidney injury (trouble passing urine or change in the amount of urine) low blood pressure (dizziness; feeling faint or lightheaded, falls; unusually weak or tired) low calcium levels (fast heartbeat; muscle cramps or pain; pain, tingling, or numbness in the hands or feet; seizures) low magnesium levels (fast, irregular heartbeat; muscle cramp or pain; muscle weakness; tremors; seizures) low red blood cell counts (trouble breathing; feeling faint; lightheaded, falls; unusually weak or tired) muscle pain redness, blistering, peeling, or loosening of the skin, including inside the mouth severe diarrhea swelling of the ankles, feet, hands trouble breathing Side effects that usually do not require medical attention (report to your doctor or health care provider if they continue or are bothersome): anxious constipation coughing depressed mood eye irritation, itching, or pain fever general ill feeling or flu-like symptoms nausea pain, redness, or irritation at site where injected trouble sleeping This list may not describe all possible side effects. Call your doctor for medical advice about side effects. You may report side effects to FDA at 1-800-FDA-1088. Where should I keep my medication? This drug is given in a hospital or clinic. It will not be stored at home. NOTE: This sheet is a summary. It may not cover all possible information. If you have questions about this medicine, talk to your doctor, pharmacist, or health care provider.  2022 Elsevier/Gold Standard (2021-06-20 00:00:00)    Hypokalemia Hypokalemia means that the amount of potassium in the blood is lower than normal. Potassium is a chemical (electrolyte) that helps regulate the amount of  fluid in the body. It also stimulates muscle tightening (contraction) and helps nerves work properly. Normally, most of the body's potassium is inside cells, and only a very small amount is in the blood. Because the amount in the blood is so small, minor changes to potassium levels in the blood can be life-threatening. What are the causes? This condition may be caused by: Antibiotic medicine. Diarrhea or vomiting. Taking too much of a medicine that helps you have a bowel movement (laxative) can cause diarrhea and lead to hypokalemia. Chronic kidney disease (CKD). Medicines that help the body get rid of excess fluid (diuretics). Eating disorders, such as bulimia. Low magnesium levels in the body. Sweating a lot. What are the signs or symptoms? Symptoms of this condition include: Weakness. Constipation. Fatigue. Muscle cramps. Mental confusion. Skipped heartbeats or irregular heartbeat (palpitations). Tingling or numbness. How is this diagnosed? This condition is diagnosed with a blood test.  How is this treated? This condition may be treated by: Taking potassium supplements by mouth. Adjusting the medicines that you take. Eating more foods that contain a lot of potassium. If your potassium level is very low, you may need to get potassium through an IV and be monitored in the hospital. Follow these instructions at home:  Take over-the-counter and prescription medicines only as told by your health care provider. This includes vitamins and supplements. Eat a healthy diet. A healthy diet includes fresh fruits and vegetables, whole grains, healthy fats, and lean proteins. If instructed, eat more foods that contain a lot of potassium. This includes: Nuts, such as peanuts and pistachios. Seeds, such as sunflower seeds and pumpkin seeds. Peas, lentils, and lima beans. Whole grain and bran cereals and breads. Fresh fruits and vegetables, such as apricots, avocado, bananas, cantaloupe, kiwi,  oranges, tomatoes, asparagus, and potatoes. Orange juice. Tomato juice. Red meats. Yogurt. Keep all follow-up visits as told by your health care provider. This is important. Contact a health care provider if you: Have weakness that gets worse. Feel your heart pounding or racing. Vomit. Have diarrhea. Have diabetes (diabetes mellitus) and you have trouble keeping your blood sugar (glucose) in your target range. Get help right away if you: Have chest pain. Have shortness of breath. Have vomiting or diarrhea that lasts for more than 2 days. Faint. Summary Hypokalemia means that the amount of potassium in the blood is lower than normal. This condition is diagnosed with a blood test. Hypokalemia may be treated by taking potassium supplements, adjusting the medicines that you take, or eating more foods that are high in potassium. If your potassium level is very low, you may need to get potassium through an IV and be monitored in the hospital. This information is not intended to replace advice given to you by your health care provider. Make sure you discuss any questions you have with your health care provider. Document Revised: 05/13/2018 Document Reviewed: 05/14/2018 Elsevier Patient Education  [place].

## 2021-12-06 NOTE — Progress Notes (Signed)
Pt and wife have questions regarding chemo outcomes. Pt reports dizziness today. Almost 5lb loss since last week.

## 2021-12-07 ENCOUNTER — Telehealth: Payer: Self-pay | Admitting: *Deleted

## 2021-12-07 ENCOUNTER — Encounter: Payer: Self-pay | Admitting: Oncology

## 2021-12-07 DIAGNOSIS — R011 Cardiac murmur, unspecified: Secondary | ICD-10-CM | POA: Diagnosis not present

## 2021-12-07 DIAGNOSIS — R0602 Shortness of breath: Secondary | ICD-10-CM | POA: Diagnosis not present

## 2021-12-07 DIAGNOSIS — C3412 Malignant neoplasm of upper lobe, left bronchus or lung: Secondary | ICD-10-CM | POA: Diagnosis not present

## 2021-12-07 DIAGNOSIS — R531 Weakness: Secondary | ICD-10-CM | POA: Diagnosis not present

## 2021-12-07 DIAGNOSIS — I10 Essential (primary) hypertension: Secondary | ICD-10-CM | POA: Diagnosis not present

## 2021-12-07 DIAGNOSIS — I2510 Atherosclerotic heart disease of native coronary artery without angina pectoris: Secondary | ICD-10-CM | POA: Diagnosis not present

## 2021-12-07 DIAGNOSIS — I739 Peripheral vascular disease, unspecified: Secondary | ICD-10-CM | POA: Diagnosis not present

## 2021-12-07 DIAGNOSIS — E119 Type 2 diabetes mellitus without complications: Secondary | ICD-10-CM | POA: Diagnosis not present

## 2021-12-07 DIAGNOSIS — I480 Paroxysmal atrial fibrillation: Secondary | ICD-10-CM | POA: Diagnosis not present

## 2021-12-07 NOTE — Telephone Encounter (Signed)
Received a call from patient family stating that patient had immunotherapy Wednesday and he is now having chills and temp of 100.5. She is requesting a return call

## 2021-12-08 ENCOUNTER — Encounter: Payer: Self-pay | Admitting: Oncology

## 2021-12-08 NOTE — Telephone Encounter (Signed)
Call returned to pt. He denies fever this morning and states that he feels well. Offered appointment to be seen in [facility] today, but pt declined at this time. Encouraged pt to call us is fever returned.

## 2021-12-14 ENCOUNTER — Encounter: Payer: Self-pay | Admitting: Oncology

## 2021-12-14 ENCOUNTER — Telehealth: Payer: Self-pay | Admitting: *Deleted

## 2021-12-14 NOTE — Telephone Encounter (Signed)
Patient's wife would like to change his appointment. ?

## 2021-12-22 NOTE — Progress Notes (Unsigned)
[place]  [phone] [phone]  ID: FAVOR HACKLER OB: December 23, 1933  MR#: [id number]  [facility]#:[id number]  Patient Care Team: [name], MD as PCP - General (Family Medicine) [name], MD as Consulting Physician (Oncology)   CHIEF COMPLAINT: Progressive adenocarcinoma of upper lobe of left lung.  INTERVAL HISTORY: Patient returns to clinic today for further evaluation and initiation of cycle 1 of Keytruda.  He currently feels well and is asymptomatic.  He is accompanied by his wife today.  He does not complain of any weakness or fatigue.  He denies pain.  He has a good appetite and denies weight loss.  He has no neurologic complaints. He denies any chest pain, shortness of breath, cough, or hemoptysis.  He has no nausea, vomiting, constipation, or diarrhea. He has no urinary complaints.  Patient offers no specific complaints today.  REVIEW OF SYSTEMS:   Review of Systems  Constitutional: Negative.  Negative for fever, malaise/fatigue and weight loss.  Respiratory: Negative.  Negative for cough, hemoptysis and shortness of breath.   Cardiovascular: Negative.  Negative for chest pain and leg swelling.  Gastrointestinal: Negative.  Negative for abdominal pain.  Genitourinary: Negative.  Negative for dysuria.  Musculoskeletal: Negative.  Negative for back pain and joint pain.  Skin: Negative.  Negative for rash.  Neurological: Negative.  Negative for sensory change, focal weakness, weakness and headaches.  Psychiatric/Behavioral: Negative.  The patient is not nervous/anxious.    As per HPI. Otherwise, a complete review of systems is negative.  PAST MEDICAL HISTORY: Past Medical History:  Diagnosis Date   Arthritis    CAD (coronary artery disease) Nov. 12, 2012   Inferior ST elevation MI in November of 2012 with ventricular fibrillation arrest. Status post drug-eluting stent placement to the mid LAD. Most recent cardiac  catheterization in June of this year showed patent stent with minimal restenosis, 75% proximal LAD and 80% distal LAD which were unchanged from before. Mild disease in the left circumflex and moderate RCA disease. Ejection fraction was 55%.   Cancer of upper lobe of left lung ([place]) 11/2016   Rad tx's.   Cardiac arrest [facility]) Nov. 2012   x 2    Cardiac arrest - ventricular fibrillation    HOH (hard of hearing)    Left Hearing Aid   Hyperlipidemia    Hypertension    Post PTCA 2013   x2   ST elevation MI (STEMI) ([place]) 08/26/2011    PAST SURGICAL HISTORY: Past Surgical History:  Procedure Laterality Date   CARDIAC CATHETERIZATION  2013   @ [place]; [place] s/p stent    CORONARY ANGIOPLASTY     CORONARY/GRAFT ACUTE MI REVASCULARIZATION N/A 06/06/2021   Procedure: Coronary/Graft Acute MI Revascularization;  Surgeon: Wellington Hampshire, MD;  Location: [facility];  Service: Cardiovascular;  Laterality: N/A;   ELECTROMAGNETIC NAVIGATION BROCHOSCOPY N/A 11/27/2016   Procedure: ELECTROMAGNETIC NAVIGATION BRONCHOSCOPY;  Surgeon: [name], MD;  Location: [facility] ORS;  Service: Cardiopulmonary;  Laterality: N/A;   LEFT HEART CATH AND CORONARY ANGIOGRAPHY N/A 06/06/2021   Procedure: LEFT HEART CATH AND CORONARY ANGIOGRAPHY;  Surgeon: Wellington Hampshire, MD;  Location: [place] [facility];  Service: Cardiovascular;  Laterality: N/A;    FAMILY HISTORY: Family History  Problem Relation Age of Onset   Heart attack Mother    Heart attack Father     ADVANCED DIRECTIVES (Y/N):  N  HEALTH MAINTENANCE: Social History   Tobacco Use   Smoking status: Former  Packs/day: 1.00    Types: Cigarettes    Quit date: 08/27/2011    Years since quitting: 10.3   Smokeless tobacco: Never  Vaping Use   Vaping Use: Never used  Substance Use Topics   Alcohol use: No   Drug use: No     Colonoscopy:  PAP:  Bone density:  Lipid panel:  Allergies  Allergen Reactions   Statins Other (See  Comments)    Arthralagia    Current Outpatient Medications  Medication Sig Dispense Refill   albuterol (VENTOLIN HFA) 108 (90 Base) MCG/ACT inhaler Inhale into the lungs every 6 (six) hours as needed.     amiodarone (PACERONE) 200 MG tablet Take 1 tablet (200 mg total) by mouth 2 (two) times daily. (Patient taking differently: Take 200 mg by mouth daily.) 60 tablet 0   amLODipine (NORVASC) 5 MG tablet Take by mouth. (Patient not taking: Reported on 11/28/2021)     apixaban (ELIQUIS) 5 MG TABS tablet Take 1 tablet (5 mg total) by mouth 2 (two) times daily. 60 tablet 1   clopidogrel (PLAVIX) 75 MG tablet Take 75 mg by mouth daily.     losartan-hydrochlorothiazide (HYZAAR) 50-12.5 MG tablet Take 1 tablet by mouth daily.     metoprolol succinate (TOPROL-XL) 50 MG 24 hr tablet Take 50 mg by mouth daily.     potassium chloride SA (KLOR-CON) 20 MEQ tablet Take by mouth. (Patient not taking: Reported on 11/28/2021)     rosuvastatin (CRESTOR) 5 MG tablet Take by mouth.     torsemide (DEMADEX) 20 MG tablet Take 1 tablet (20 mg total) by mouth daily. 30 tablet 0   No current facility-administered medications for this visit.    OBJECTIVE: There were no vitals filed for this visit.    There is no height or weight on file to calculate BMI.    ECOG FS:0 - Asymptomatic  General: Thin, no acute distress. Eyes: Pink conjunctiva, anicteric sclera. HEENT: Normocephalic, moist mucous membranes. Lungs: No audible wheezing or coughing. Heart: Regular rate and rhythm. Abdomen: Soft, nontender, no obvious distention. Musculoskeletal: No edema, cyanosis, or clubbing. Neuro: Alert, answering all questions appropriately. Cranial nerves grossly intact. Skin: No rashes or petechiae noted. Psych: Normal affect.  LAB RESULTS:  Lab Results  Component Value Date   NA 135 12/06/2021   K 2.8 (L) 12/06/2021   CL 89 (L) 12/06/2021   CO2 37 (H) 12/06/2021   GLUCOSE 121 (H) 12/06/2021   BUN 24 (H) 12/06/2021    CREATININE 1.20 12/06/2021   CALCIUM 9.2 12/06/2021   PROT 6.8 12/06/2021   ALBUMIN 3.8 12/06/2021   AST 26 12/06/2021   ALT 13 12/06/2021   ALKPHOS 181 (H) 12/06/2021   BILITOT 0.7 12/06/2021   GFRNONAA 59 (L) 12/06/2021   GFRAA >60 03/15/2020    Lab Results  Component Value Date   WBC 11.7 (H) 12/06/2021   NEUTROABS 8.6 (H) 12/06/2021   HGB 14.1 12/06/2021   HCT 41.9 12/06/2021   MCV 92.5 12/06/2021   PLT 244 12/06/2021     STUDIES: CT CHEST ABDOMEN PELVIS W CONTRAST  Result Date: 11/24/2021 CLINICAL DATA:  Metastatic lung cancer restaging EXAM: CT CHEST, ABDOMEN, AND PELVIS WITH CONTRAST TECHNIQUE: Multidetector CT imaging of the chest, abdomen and pelvis was performed following the standard protocol during bolus administration of intravenous contrast. RADIATION DOSE REDUCTION: This exam was performed according to the departmental dose-optimization program which includes automated exposure control, adjustment of the mA and/or kV according to patient size  and/or use of iterative reconstruction technique. CONTRAST:  167m OMNIPAQUE IOHEXOL 300 MG/ML SOLN, additional oral enteric contrast COMPARISON:  PET-CT, 08/17/2021, CT chest angiogram, 08/01/2021 FINDINGS: CT CHEST FINDINGS Cardiovascular: Aortic atherosclerosis. Normal heart size. Three-vessel coronary artery calcifications and stents. No pericardial effusion. Mediastinum/Nodes: No enlarged mediastinal, hilar, or axillary lymph nodes. Thyroid gland, trachea, and esophagus demonstrate no significant findings. Lungs/Pleura: Interval improvement in bilateral pleural effusions, small on the left, trace on the right. Unchanged post treatment appearance of the left upper lobe, with near complete consolidation and atelectasis and proximal obstruction of the upper lobe bronchus (series 4, image 66). Numerous new and enlarged small bilateral pulmonary nodules, for example a new nodule of the anterior right apex measuring 0.3 cm (series 4,  image 31) a new nodule of the left lower lobe measuring 0.3 cm (series 4, image 52), and an enlarged nodule of the more inferior left lower lobe measuring 0.7 cm, previously 0.4 cm (series 4, image 63). There is extensive interlobular septal thickening throughout the right lung (series 4, image 124), somewhat increased compared to prior examination mild underlying emphysema. Musculoskeletal: No chest wall mass. Nonacute fracture of the superior sacral body (series 6, image 85). CT ABDOMEN PELVIS FINDINGS Hepatobiliary: No solid liver abnormality is seen. No gallstones, gallbladder wall thickening, or biliary dilatation. Pancreas: Unremarkable. No pancreatic ductal dilatation or surrounding inflammatory changes. Spleen: Normal in size without significant abnormality. Adrenals/Urinary Tract: Adrenal glands are unremarkable. Kidneys are normal, without renal calculi, solid lesion, or hydronephrosis. Bladder is unremarkable. Stomach/Bowel: Stomach is within normal limits. Appendix appears normal. No evidence of bowel wall thickening, distention, or inflammatory changes. Vascular/Lymphatic: Aortic atherosclerosis. No enlarged abdominal or pelvic lymph nodes. Reproductive: Prostatomegaly. Other: No abdominal wall hernia or abnormality. No ascites. Musculoskeletal: No acute osseous findings. Redemonstrated mixed lytic and sclerotic osseous metastatic disease with interval increase in size and sclerosis multiple lesions, particularly involving the sacrum and pelvis (series 2, image 91). IMPRESSION: 1. Numerous new and enlarged small bilateral pulmonary nodules, consistent with worsened pulmonary metastatic disease. 2. Extensive interlobular septal thickening throughout the right lung, somewhat increased, highly concerning for lymphangitic metastatic involvement. 3. Unchanged post treatment appearance of the left upper lobe, with near complete consolidation and atelectasis. 4. Interval improvement in bilateral pleural  effusions, small on the left, trace on the right. 5. Redemonstrated mixed lytic and sclerotic osseous metastatic disease with interval increase in size and sclerosis multiple lesions, particularly involving the sacrum and pelvis. This likely reflects a combination of osseous metastatic disease progression and post treatment sclerosis. 6. Coronary artery disease. Aortic Atherosclerosis (ICD10-I70.0). Electronically Signed   By: ADelanna AhmadiM.D.   On: 11/24/2021 10:07     ONCOLOGY HISTORY:  Biopsy of lung nodule on November 27, 2016 revealed adenocarcinoma, but there was no invasive component on the sample. Biopsy of mediastinal lymph node had insufficient material. Patient ultimately declined any further biopsies or surgery and elected to proceed with XRT alone which he completed in April 2018.  PET scan on January 22, 2019 reviewed independently with increased metabolic activity prior to previous with SUV increasing from 6.5 up to 8.8.  Patient declined intervention at that time and elected to proceed with simple observation.  Repeat CT scan on April 21, 2019 reviewed independently with progressive 6.4 cm mass in the left upper lobe.  Patient declined systemic treatment, but agreed to proceed with XRT which she completed in approximately August 2020.  CT scan on September 23, 2019 reviewed independently with no obvious  evidence of recurrent or progressive disease.   ASSESSMENT: Progressive adenocarcinoma of upper lobe of left lung   PLAN:   1.  Progressive adenocarcinoma of upper lobe of left lung: PET scan results from August 17, 2021 reviewed independently with interval continued progression of bony metastatic disease and a new hypermetabolic bone lesion in the right sacrum.  Bilateral pulmonary nodules were stable, but concerning for metastatic involvement.  At that time we discussed systemic treatment, but patient and his wife elected for continued active surveillance.  Previously he stated he would not  want aggressive chemotherapy but would consider immunotherapy if needed despite PD-L1 being unknown.  Repeat CT scan results from November 23, 2021 reviewed independently and reported as above with continued progression of disease.  Patient has now agreed to systemic therapy.  Proceed with cycle 1 of Keytruda today.  Return to clinic in 3 weeks for further evaluation and consideration of cycle 2.  Plan to reimage in May or June 2023.     2.  Shoulder/back pain: Patient does not complain of this today.  He reports this has been evident for years/decades. Continue tramadol as needed. 3.  Shortness of breath/CHF: Patient does not complain of this today.  Follow-up with cardiology as scheduled.  Follow-up with cardiology as scheduled next week. 4.  Bony disease: Will initiate Zometa with odd-numbered cycles.  I spent a total of 30 minutes reviewing chart data, face-to-face evaluation with the patient, counseling and coordination of care as detailed above.    Patient expressed understanding and was in agreement with this plan. He also understands that He can call clinic at any time with any questions, concerns, or complaints.     [name], MD   12/22/2021 9:30 AM

## 2021-12-27 ENCOUNTER — Ambulatory Visit: Payer: Medicare HMO

## 2021-12-27 ENCOUNTER — Ambulatory Visit: Payer: Medicare HMO | Admitting: Oncology

## 2021-12-27 ENCOUNTER — Other Ambulatory Visit: Payer: Medicare HMO

## 2021-12-27 ENCOUNTER — Encounter: Payer: Self-pay | Admitting: Oncology

## 2021-12-27 ENCOUNTER — Encounter: Payer: Medicare HMO | Admitting: Hospice and Palliative Medicine

## 2021-12-28 ENCOUNTER — Other Ambulatory Visit: Payer: Self-pay

## 2021-12-28 ENCOUNTER — Inpatient Hospital Stay: Payer: Medicare HMO | Attending: Oncology | Admitting: Oncology

## 2021-12-28 ENCOUNTER — Inpatient Hospital Stay: Payer: Medicare HMO

## 2021-12-28 ENCOUNTER — Inpatient Hospital Stay: Payer: Medicare HMO | Admitting: Hospice and Palliative Medicine

## 2021-12-28 VITALS — BP 132/70 | HR 66 | Temp 97.1°F | Resp 16 | Ht 71.0 in | Wt 128.6 lb

## 2021-12-28 DIAGNOSIS — C7951 Secondary malignant neoplasm of bone: Secondary | ICD-10-CM | POA: Diagnosis not present

## 2021-12-28 DIAGNOSIS — Z79899 Other long term (current) drug therapy: Secondary | ICD-10-CM | POA: Diagnosis not present

## 2021-12-28 DIAGNOSIS — Z5112 Encounter for antineoplastic immunotherapy: Secondary | ICD-10-CM | POA: Diagnosis not present

## 2021-12-28 DIAGNOSIS — C3412 Malignant neoplasm of upper lobe, left bronchus or lung: Secondary | ICD-10-CM | POA: Diagnosis not present

## 2021-12-28 LAB — CBC WITH DIFFERENTIAL/PLATELET
Abs Immature Granulocytes: 0.09 10*3/uL — ABNORMAL HIGH (ref 0.00–0.07)
Basophils Absolute: 0.1 10*3/uL (ref 0.0–0.1)
Basophils Relative: 1 %
Eosinophils Absolute: 0.7 10*3/uL — ABNORMAL HIGH (ref 0.0–0.5)
Eosinophils Relative: 5 %
HCT: 44.6 % (ref 39.0–52.0)
Hemoglobin: 15 g/dL (ref 13.0–17.0)
Immature Granulocytes: 1 %
Lymphocytes Relative: 12 %
Lymphs Abs: 1.4 10*3/uL (ref 0.7–4.0)
MCH: 30.3 pg (ref 26.0–34.0)
MCHC: 33.6 g/dL (ref 30.0–36.0)
MCV: 90.1 fL (ref 80.0–100.0)
Monocytes Absolute: 1 10*3/uL (ref 0.1–1.0)
Monocytes Relative: 8 %
Neutro Abs: 9.2 10*3/uL — ABNORMAL HIGH (ref 1.7–7.7)
Neutrophils Relative %: 73 %
Platelets: 283 10*3/uL (ref 150–400)
RBC: 4.95 MIL/uL (ref 4.22–5.81)
RDW: 13.3 % (ref 11.5–15.5)
WBC: 12.5 10*3/uL — ABNORMAL HIGH (ref 4.0–10.5)
nRBC: 0 % (ref 0.0–0.2)

## 2021-12-28 LAB — COMPREHENSIVE METABOLIC PANEL
ALT: 22 U/L (ref 0–44)
AST: 30 U/L (ref 15–41)
Albumin: 3.7 g/dL (ref 3.5–5.0)
Alkaline Phosphatase: 193 U/L — ABNORMAL HIGH (ref 38–126)
Anion gap: 11 (ref 5–15)
BUN: 19 mg/dL (ref 8–23)
CO2: 26 mmol/L (ref 22–32)
Calcium: 8.4 mg/dL — ABNORMAL LOW (ref 8.9–10.3)
Chloride: 99 mmol/L (ref 98–111)
Creatinine, Ser: 1.11 mg/dL (ref 0.61–1.24)
GFR, Estimated: >60 mL/min (ref >60)
Glucose, Bld: 138 mg/dL — ABNORMAL HIGH (ref 70–99)
Potassium: 4.1 mmol/L (ref 3.5–5.1)
Sodium: 136 mmol/L (ref 135–145)
Total Bilirubin: 0.6 mg/dL (ref 0.3–1.2)
Total Protein: 7 g/dL (ref 6.5–8.1)

## 2021-12-28 LAB — TSH: TSH: 2.201 u[IU]/mL (ref 0.350–4.500)

## 2021-12-28 MED ORDER — SODIUM CHLORIDE 0.9 % IV SOLN
Freq: Once | INTRAVENOUS | Status: AC
  Filled 2021-12-28: qty 250

## 2021-12-28 MED ORDER — SODIUM CHLORIDE 0.9 % IV SOLN
200.0000 mg | Freq: Once | INTRAVENOUS | Status: AC
  Administered 2021-12-28: 200 mg via INTRAVENOUS
  Filled 2021-12-28: qty 8

## 2021-12-28 NOTE — Progress Notes (Signed)
Pt wife questioning patient receiving chemotherapy. ?

## 2021-12-28 NOTE — Patient Instructions (Signed)
[facility] [place]  Discharge Instructions: ?Thank you for choosing [facility] to provide your oncology and hematology care.  ?If you have a lab appointment with the [facility], please go directly to the [facility] and check in at the registration area. ? ?Wear comfortable clothing and clothing appropriate for easy access to any Portacath or PICC line.  ? ?We strive to give you quality time with your provider. You may need to reschedule your appointment if you arrive late (15 or more minutes).  Arriving late affects you and other patients whose appointments are after yours.  Also, if you miss three or more appointments without notifying the office, you may be dismissed from the clinic at the provider?s discretion.    ?  ?For prescription refill requests, have your pharmacy contact our office and allow 72 hours for refills to be completed.   ? ?Today you received the following chemotherapy and/or immunotherapy agents : Keytruda  ?  ?To help prevent nausea and vomiting after your treatment, we encourage you to take your nausea medication as directed. ? ?BELOW ARE SYMPTOMS THAT SHOULD BE REPORTED IMMEDIATELY: ?*FEVER GREATER THAN 100.4 F (38 ?C) OR HIGHER ?*CHILLS OR SWEATING ?*NAUSEA AND VOMITING THAT IS NOT CONTROLLED WITH YOUR NAUSEA MEDICATION ?*UNUSUAL SHORTNESS OF BREATH ?*UNUSUAL BRUISING OR BLEEDING ?*URINARY PROBLEMS (pain or burning when urinating, or frequent urination) ?*BOWEL PROBLEMS (unusual diarrhea, constipation, pain near the anus) ?TENDERNESS IN MOUTH AND THROAT WITH OR WITHOUT PRESENCE OF ULCERS (sore throat, sores in mouth, or a toothache) ?UNUSUAL RASH, SWELLING OR PAIN  ?UNUSUAL VAGINAL DISCHARGE OR ITCHING  ? ?Items with * indicate a potential emergency and should be followed up as soon as possible or go to the Emergency Department if any problems should occur. ? ?Please show the CHEMOTHERAPY ALERT CARD or IMMUNOTHERAPY ALERT CARD at check-in to  the Emergency Department and triage nurse. ? ?Should you have questions after your visit or need to cancel or reschedule your appointment, please contact [facility] [place] [facility] [place]  [phone] and follow the prompts.  Office hours are 8:00 a.m. to 4:30 p.m. Monday - Friday. Please note that voicemails left after 4:00 p.m. may not be returned until the following business day.  We are closed weekends and major holidays. You have access to a nurse at all times for urgent questions. Please call the main number to the clinic [phone] and follow the prompts. ? ?For any non-urgent questions, you may also contact your provider using MyChart. We now offer e-Visits for anyone 77 and older to request care online for non-urgent symptoms. For details visit [email or web address]. ?  ?Also download the MyChart app! Go to the app store, search "MyChart", open the app, select [facility], and log in with your MyChart username and password. ? ?Due to Covid, a mask is required upon entering the hospital/clinic. If you do not have a mask, one will be given to you upon arrival. For doctor visits, patients may have 1 support person aged 33 or older with them. For treatment visits, patients cannot have anyone with them due to current Covid guidelines and our immunocompromised population.  ?

## 2021-12-29 LAB — T4: T4, Total: 9.5 ug/dL (ref 4.5–12.0)

## 2022-01-14 NOTE — Progress Notes (Signed)
?[place]  ?[phone] [phone] ? ?ID: Scott Gross OB: 1934/08/12  MR#: [id number]  OHY#:[id number] ? ?Patient Care Team: ?[name], MD as PCP - General (Family Medicine) ?[name], MD as Consulting Physician (Oncology) ? ? ?CHIEF COMPLAINT: Progressive adenocarcinoma of upper lobe of left lung. ? ?INTERVAL HISTORY: Patient returns to clinic today for further evaluation and consideration of cycle 3 of single agent Keytruda.  He did not complain of increased weakness and fatigue after his second infusion.  His only complaint today is of chronic back pain.  He has a good appetite and denies weight loss.  He has no neurologic complaints. He denies any chest pain, shortness of breath, cough, or hemoptysis.  He has no nausea, vomiting, constipation, or diarrhea. He has no urinary complaints.  Patient offers no further specific complaints today. ? ?REVIEW OF SYSTEMS:   ?Review of Systems  ?Constitutional: Negative.  Negative for fever, malaise/fatigue and weight loss.  ?Respiratory: Negative.  Negative for cough, hemoptysis and shortness of breath.   ?Cardiovascular: Negative.  Negative for chest pain and leg swelling.  ?Gastrointestinal: Negative.  Negative for abdominal pain.  ?Genitourinary: Negative.  Negative for dysuria.  ?Musculoskeletal:  Positive for back pain. Negative for joint pain.  ?Skin: Negative.  Negative for rash.  ?Neurological: Negative.  Negative for sensory change, focal weakness, weakness and headaches.  ?Psychiatric/Behavioral: Negative.  The patient is not nervous/anxious.   ? ?As per HPI. Otherwise, a complete review of systems is negative. ? ?PAST MEDICAL HISTORY: ?Past Medical History:  ?Diagnosis Date  ? Arthritis   ? CAD (coronary artery disease) Nov. 12, 2012  ? Inferior ST elevation MI in November of 2012 with ventricular fibrillation arrest. Status post drug-eluting stent placement to the mid LAD. Most recent cardiac  catheterization in June of this year showed patent stent with minimal restenosis, 75% proximal LAD and 80% distal LAD which were unchanged from before. Mild disease in the left circumflex and moderate RCA disease. Ejection fraction was 55%.  ? Cancer of upper lobe of left lung ([place]) 11/2016  ? Rad tx's.  ? Cardiac arrest [facility]) Nov. 2012  ? x 2   ? Cardiac arrest - ventricular fibrillation   ? HOH (hard of hearing)   ? Left Hearing Aid  ? Hyperlipidemia   ? Hypertension   ? Post PTCA 2013  ? x2  ? ST elevation MI (STEMI) ([place]) 08/26/2011  ? ? ?PAST SURGICAL HISTORY: ?Past Surgical History:  ?Procedure Laterality Date  ? CARDIAC CATHETERIZATION  2013  ? @ [place]; [facility] s/p stent   ? CORONARY ANGIOPLASTY    ? CORONARY/GRAFT ACUTE MI REVASCULARIZATION N/A 06/06/2021  ? Procedure: Coronary/Graft Acute MI Revascularization;  Surgeon: Wellington Hampshire, MD;  Location: [place] CV [facility];  Service: Cardiovascular;  Laterality: N/A;  ? ELECTROMAGNETIC NAVIGATION BROCHOSCOPY N/A 11/27/2016  ? Procedure: ELECTROMAGNETIC NAVIGATION BRONCHOSCOPY;  Surgeon: [name], MD;  Location: [facility] ORS;  Service: Cardiopulmonary;  Laterality: N/A;  ? LEFT HEART CATH AND CORONARY ANGIOGRAPHY N/A 06/06/2021  ? Procedure: LEFT HEART CATH AND CORONARY ANGIOGRAPHY;  Surgeon: Wellington Hampshire, MD;  Location: [place] [facility];  Service: Cardiovascular;  Laterality: N/A;  ? ? ?FAMILY HISTORY: ?Family History  ?Problem Relation Age of Onset  ? Heart attack Mother   ? Heart attack Father   ? ? ?ADVANCED DIRECTIVES (Y/N):  N ? ?HEALTH MAINTENANCE: ?Social History  ? ?Tobacco Use  ? Smoking status: Former  ?  Packs/day: 1.00  ?  Types: Cigarettes  ?  Quit date: 08/27/2011  ?  Years since quitting: 10.4  ? Smokeless tobacco: Never  ?Vaping Use  ? Vaping Use: Never used  ?Substance Use Topics  ? Alcohol use: No  ? Drug use: No  ? ? ? Colonoscopy: ? PAP: ? Bone density: ? Lipid panel: ? ?Allergies  ?Allergen Reactions  ? Statins Other (See  Comments)  ?  Arthralagia  ? ? ?Current Outpatient Medications  ?Medication Sig Dispense Refill  ? albuterol (VENTOLIN HFA) 108 (90 Base) MCG/ACT inhaler Inhale into the lungs every 6 (six) hours as needed.    ? amiodarone (PACERONE) 200 MG tablet Take 1 tablet (200 mg total) by mouth 2 (two) times daily. (Patient taking differently: Take 100 mg by mouth daily.) 60 tablet 0  ? amLODipine (NORVASC) 5 MG tablet Take by mouth.    ? apixaban (ELIQUIS) 5 MG TABS tablet Take 1 tablet (5 mg total) by mouth 2 (two) times daily. 60 tablet 1  ? clopidogrel (PLAVIX) 75 MG tablet Take 75 mg by mouth daily.    ? losartan-hydrochlorothiazide (HYZAAR) 50-12.5 MG tablet Take 1 tablet by mouth daily.    ? potassium chloride SA (KLOR-CON) 20 MEQ tablet Take by mouth.    ? rosuvastatin (CRESTOR) 5 MG tablet Take 2.5 mg by mouth daily.    ? torsemide (DEMADEX) 20 MG tablet Take 1 tablet (20 mg total) by mouth daily. 30 tablet 0  ? traMADol (ULTRAM) 50 MG tablet Take 1 tablet (50 mg total) by mouth 2 (two) times daily. As needed for pain. 60 tablet 0  ? ?No current facility-administered medications for this visit.  ? ? ?OBJECTIVE: ?Vitals:  ? 01/18/22 0929  ?BP: 134/73  ?Pulse: 84  ?Resp: 16  ?Temp: (!) 96.2 ?F (35.7 ?C)  ?SpO2: 100%  ?   Body mass index is 17.99 kg/m?Marland Kitchen    ECOG FS:0 - Asymptomatic ? ?General: Thin, no acute distress. ?Eyes: Pink conjunctiva, anicteric sclera. ?HEENT: Normocephalic, moist mucous membranes. ?Lungs: No audible wheezing or coughing. ?Heart: Regular rate and rhythm. ?Abdomen: Soft, nontender, no obvious distention. ?Musculoskeletal: No edema, cyanosis, or clubbing. ?Neuro: Alert, answering all questions appropriately. Cranial nerves grossly intact. ?Skin: No rashes or petechiae noted. ?Psych: Normal affect. ? ? ?LAB RESULTS: ? ?Lab Results  ?Component Value Date  ? NA 137 01/18/2022  ? K 3.6 01/18/2022  ? CL 99 01/18/2022  ? CO2 29 01/18/2022  ? GLUCOSE 129 (H) 01/18/2022  ? BUN 13 01/18/2022  ? CREATININE  0.89 01/18/2022  ? CALCIUM 8.5 (L) 01/18/2022  ? PROT 7.0 01/18/2022  ? ALBUMIN 3.9 01/18/2022  ? AST 24 01/18/2022  ? ALT 15 01/18/2022  ? ALKPHOS 236 (H) 01/18/2022  ? BILITOT 0.8 01/18/2022  ? GFRNONAA >60 01/18/2022  ? GFRAA >60 03/15/2020  ? ? ?Lab Results  ?Component Value Date  ? WBC 10.9 (H) 01/18/2022  ? NEUTROABS 8.0 (H) 01/18/2022  ? HGB 14.6 01/18/2022  ? HCT 43.7 01/18/2022  ? MCV 87.6 01/18/2022  ? PLT 230 01/18/2022  ? ? ? ?STUDIES: ?No results found. ? ? ?ONCOLOGY HISTORY:  Biopsy of lung nodule on November 27, 2016 revealed adenocarcinoma, but there was no invasive component on the sample. Biopsy of mediastinal lymph node had insufficient material. Patient ultimately declined any further biopsies or surgery and elected to proceed with XRT alone which he completed in April 2018.  PET scan on January 22, 2019 reviewed independently with increased  metabolic activity prior to previous with SUV increasing from 6.5 up to 8.8.  Patient declined intervention at that time and elected to proceed with simple observation.  Repeat CT scan on April 21, 2019 reviewed independently with progressive 6.4 cm mass in the left upper lobe.  Patient declined systemic treatment, but agreed to proceed with XRT which she completed in approximately August 2020.  CT scan on September 23, 2019 reviewed independently with no obvious evidence of recurrent or progressive disease.  ? ?ASSESSMENT: Progressive adenocarcinoma of upper lobe of left lung  ? ?PLAN:  ? ?1.  Progressive adenocarcinoma of upper lobe of left lung: PET scan results from August 17, 2021 reviewed independently with interval continued progression of bony metastatic disease and a new hypermetabolic bone lesion in the right sacrum.  Bilateral pulmonary nodules were stable, but concerning for metastatic involvement.  At that time we discussed systemic treatment, but patient and his wife elected for continued active surveillance.  Previously he stated he would not want  aggressive chemotherapy but would consider immunotherapy if needed despite PD-L1 being unknown.  Repeat CT scan results from November 23, 2021 reviewed independently with continued progression of disease.  Patient has n

## 2022-01-18 ENCOUNTER — Inpatient Hospital Stay: Payer: Medicare HMO | Admitting: Oncology

## 2022-01-18 ENCOUNTER — Other Ambulatory Visit: Payer: Self-pay

## 2022-01-18 ENCOUNTER — Inpatient Hospital Stay: Payer: Medicare HMO

## 2022-01-18 ENCOUNTER — Inpatient Hospital Stay: Payer: Medicare HMO | Admitting: Hospice and Palliative Medicine

## 2022-01-18 ENCOUNTER — Inpatient Hospital Stay: Payer: Medicare HMO | Attending: Nurse Practitioner

## 2022-01-18 ENCOUNTER — Encounter: Payer: Self-pay | Admitting: Oncology

## 2022-01-18 VITALS — BP 134/73 | HR 84 | Temp 96.2°F | Resp 16 | Ht 71.0 in | Wt 129.0 lb

## 2022-01-18 DIAGNOSIS — C7951 Secondary malignant neoplasm of bone: Secondary | ICD-10-CM | POA: Insufficient documentation

## 2022-01-18 DIAGNOSIS — Z5112 Encounter for antineoplastic immunotherapy: Secondary | ICD-10-CM | POA: Insufficient documentation

## 2022-01-18 DIAGNOSIS — Z79899 Other long term (current) drug therapy: Secondary | ICD-10-CM | POA: Insufficient documentation

## 2022-01-18 DIAGNOSIS — C3412 Malignant neoplasm of upper lobe, left bronchus or lung: Secondary | ICD-10-CM | POA: Insufficient documentation

## 2022-01-18 LAB — CBC WITH DIFFERENTIAL/PLATELET
Abs Immature Granulocytes: 0.05 10*3/uL (ref 0.00–0.07)
Basophils Absolute: 0.1 10*3/uL (ref 0.0–0.1)
Basophils Relative: 1 %
Eosinophils Absolute: 0.4 10*3/uL (ref 0.0–0.5)
Eosinophils Relative: 4 %
HCT: 43.7 % (ref 39.0–52.0)
Hemoglobin: 14.6 g/dL (ref 13.0–17.0)
Immature Granulocytes: 1 %
Lymphocytes Relative: 14 %
Lymphs Abs: 1.5 10*3/uL (ref 0.7–4.0)
MCH: 29.3 pg (ref 26.0–34.0)
MCHC: 33.4 g/dL (ref 30.0–36.0)
MCV: 87.6 fL (ref 80.0–100.0)
Monocytes Absolute: 0.9 10*3/uL (ref 0.1–1.0)
Monocytes Relative: 8 %
Neutro Abs: 8 10*3/uL — ABNORMAL HIGH (ref 1.7–7.7)
Neutrophils Relative %: 72 %
Platelets: 230 10*3/uL (ref 150–400)
RBC: 4.99 MIL/uL (ref 4.22–5.81)
RDW: 13.4 % (ref 11.5–15.5)
WBC: 10.9 10*3/uL — ABNORMAL HIGH (ref 4.0–10.5)
nRBC: 0 % (ref 0.0–0.2)

## 2022-01-18 LAB — COMPREHENSIVE METABOLIC PANEL
ALT: 15 U/L (ref 0–44)
AST: 24 U/L (ref 15–41)
Albumin: 3.9 g/dL (ref 3.5–5.0)
Alkaline Phosphatase: 236 U/L — ABNORMAL HIGH (ref 38–126)
Anion gap: 9 (ref 5–15)
BUN: 13 mg/dL (ref 8–23)
CO2: 29 mmol/L (ref 22–32)
Calcium: 8.5 mg/dL — ABNORMAL LOW (ref 8.9–10.3)
Chloride: 99 mmol/L (ref 98–111)
Creatinine, Ser: 0.89 mg/dL (ref 0.61–1.24)
GFR, Estimated: >60 mL/min (ref >60)
Glucose, Bld: 129 mg/dL — ABNORMAL HIGH (ref 70–99)
Potassium: 3.6 mmol/L (ref 3.5–5.1)
Sodium: 137 mmol/L (ref 135–145)
Total Bilirubin: 0.8 mg/dL (ref 0.3–1.2)
Total Protein: 7 g/dL (ref 6.5–8.1)

## 2022-01-18 LAB — TSH: TSH: 2.105 u[IU]/mL (ref 0.350–4.500)

## 2022-01-18 MED ORDER — TRAMADOL HCL 50 MG PO TABS: 50.0000 mg | ORAL_TABLET | Freq: Two times a day (BID) | ORAL | 0 refills | Status: AC

## 2022-01-18 MED ORDER — ZOLEDRONIC ACID 4 MG/5ML IV CONC
3.0000 mg | Freq: Once | INTRAVENOUS | Status: AC
  Administered 2022-01-18: 3 mg via INTRAVENOUS
  Filled 2022-01-18: qty 3.75

## 2022-01-18 MED ORDER — SODIUM CHLORIDE 0.9 % IV SOLN
Freq: Once | INTRAVENOUS | Status: AC
  Filled 2022-01-18: qty 250

## 2022-01-18 MED ORDER — SODIUM CHLORIDE 0.9 % IV SOLN
200.0000 mg | Freq: Once | INTRAVENOUS | Status: AC
  Administered 2022-01-18: 200 mg via INTRAVENOUS
  Filled 2022-01-18: qty 200

## 2022-01-18 NOTE — Progress Notes (Signed)
Calcium 8.5, per Dr. Grayland Ormond okay to proceed with Zometa.  ? ?

## 2022-01-18 NOTE — Patient Instructions (Signed)
[facility] AT [place]  Discharge Instructions: ?Thank you for choosing [facility] to provide your oncology and hematology care.  ?If you have a lab appointment with the [facility], please go directly to the [facility] and check in at the registration area. ? ?Wear comfortable clothing and clothing appropriate for easy access to any Portacath or PICC line.  ? ?We strive to give you quality time with your provider. You may need to reschedule your appointment if you arrive late (15 or more minutes).  Arriving late affects you and other patients whose appointments are after yours.  Also, if you miss three or more appointments without notifying the office, you may be dismissed from the clinic at the provider?s discretion.    ?  ?For prescription refill requests, have your pharmacy contact our office and allow 72 hours for refills to be completed.   ? ?Today you received the following chemotherapy and/or immunotherapy agents Keytruda     ?  ?To help prevent nausea and vomiting after your treatment, we encourage you to take your nausea medication as directed. ? ?BELOW ARE SYMPTOMS THAT SHOULD BE REPORTED IMMEDIATELY: ?*FEVER GREATER THAN 100.4 F (38 ?C) OR HIGHER ?*CHILLS OR SWEATING ?*NAUSEA AND VOMITING THAT IS NOT CONTROLLED WITH YOUR NAUSEA MEDICATION ?*UNUSUAL SHORTNESS OF BREATH ?*UNUSUAL BRUISING OR BLEEDING ?*URINARY PROBLEMS (pain or burning when urinating, or frequent urination) ?*BOWEL PROBLEMS (unusual diarrhea, constipation, pain near the anus) ?TENDERNESS IN MOUTH AND THROAT WITH OR WITHOUT PRESENCE OF ULCERS (sore throat, sores in mouth, or a toothache) ?UNUSUAL RASH, SWELLING OR PAIN  ?UNUSUAL VAGINAL DISCHARGE OR ITCHING  ? ?Items with * indicate a potential emergency and should be followed up as soon as possible or go to the Emergency Department if any problems should occur. ? ?Please show the CHEMOTHERAPY ALERT CARD or IMMUNOTHERAPY ALERT CARD at check-in to  the Emergency Department and triage nurse. ? ?Should you have questions after your visit or need to cancel or reschedule your appointment, please contact [facility] [place] AT [place]  [phone] and follow the prompts.  Office hours are 8:00 a.m. to 4:30 p.m. Monday - Friday. Please note that voicemails left after 4:00 p.m. may not be returned until the following business day.  We are closed weekends and major holidays. You have access to a nurse at all times for urgent questions. Please call the main number to the clinic [phone] and follow the prompts. ? ?For any non-urgent questions, you may also contact your provider using MyChart. We now offer e-Visits for anyone 77 and older to request care online for non-urgent symptoms. For details visit [email or web address]. ?  ?Also download the MyChart app! Go to the app store, search "MyChart", open the app, select [facility], and log in with your MyChart username and password. ? ?Due to Covid, a mask is required upon entering the hospital/clinic. If you do not have a mask, one will be given to you upon arrival. For doctor visits, patients may have 1 support person aged 54 or older with them. For treatment visits, patients cannot have anyone with them due to current Covid guidelines and our immunocompromised population.  ? ?Zoledronic Acid Injection (Hypercalcemia, Oncology) ?What is this medication? ?ZOLEDRONIC ACID (ZOE le dron ik AS id) slows calcium loss from bones. It high calcium levels in the blood from some kinds of cancer. It may be used in other people at risk for bone loss. ?This medicine may be used for other purposes;  ask your health care provider or pharmacist if you have questions. ?COMMON BRAND NAME(S): Zometa ?What should I tell my care team before I take this medication? ?They need to know if you have any of these conditions: ?cancer ?dehydration ?dental disease ?kidney disease ?liver disease ?low levels of calcium in the  blood ?lung or breathing disease (asthma) ?receiving steroids like dexamethasone or prednisone ?an unusual or allergic reaction to zoledronic acid, other medicines, foods, dyes, or preservatives ?pregnant or trying to get pregnant ?breast-feeding ?How should I use this medication? ?This drug is injected into a vein. It is given by a health care provider in a hospital or clinic setting. ?Talk to your health care provider about the use of this drug in children. Special care may be needed. ?Overdosage: If you think you have taken too much of this medicine contact a poison control center or emergency room at once. ?NOTE: This medicine is only for you. Do not share this medicine with others. ?What if I miss a dose? ?Keep appointments for follow-up doses. It is important not to miss your dose. Call your health care provider if you are unable to keep an appointment. ?What may interact with this medication? ?certain antibiotics given by injection ?NSAIDs, medicines for pain and inflammation, like ibuprofen or naproxen ?some diuretics like bumetanide, furosemide ?teriparatide ?thalidomide ?This list may not describe all possible interactions. Give your health care provider a list of all the medicines, herbs, non-prescription drugs, or dietary supplements you use. Also tell them if you smoke, drink alcohol, or use illegal drugs. Some items may interact with your medicine. ?What should I watch for while using this medication? ?Visit your health care provider for regular checks on your progress. It may be some time before you see the benefit from this drug. ?Some people who take this drug have severe bone, joint, or muscle pain. This drug may also increase your risk for jaw problems or a broken thigh bone. Tell your health care provider right away if you have severe pain in your jaw, bones, joints, or muscles. Tell you health care provider if you have any pain that does not go away or that gets worse. ?Tell your dentist and  dental surgeon that you are taking this drug. You should not have major dental surgery while on this drug. See your dentist to have a dental exam and fix any dental problems before starting this drug. Take good care of your teeth while on this drug. Make sure you see your dentist for regular follow-up appointments. ?You should make sure you get enough calcium and vitamin D while you are taking this drug. Discuss the foods you eat and the vitamins you take with your health care provider. ?Check with your health care provider if you have severe diarrhea, nausea, and vomiting, or if you sweat a lot. The loss of too much body fluid may make it dangerous for you to take this drug. ?You may need blood work done while you are taking this drug. ?Do not become pregnant while taking this drug. Women should inform their health care provider if they wish to become pregnant or think they might be pregnant. There is potential for serious harm to an unborn child. Talk to your health care provider for more information. ?What side effects may I notice from receiving this medication? ?Side effects that you should report to your doctor or health care provider as soon as possible: ?allergic reactions (skin rash, itching or hives; swelling of the face, lips,  or tongue) ?bone pain ?infection (fever, chills, cough, sore throat, pain or trouble passing urine) ?jaw pain, especially after dental work ?joint pain ?kidney injury (trouble passing urine or change in the amount of urine) ?low blood pressure (dizziness; feeling faint or lightheaded, falls; unusually weak or tired) ?low calcium levels (fast heartbeat; muscle cramps or pain; pain, tingling, or numbness in the hands or feet; seizures) ?low magnesium levels (fast, irregular heartbeat; muscle cramp or pain; muscle weakness; tremors; seizures) ?low red blood cell counts (trouble breathing; feeling faint; lightheaded, falls; unusually weak or tired) ?muscle pain ?redness, blistering,  peeling, or loosening of the skin, including inside the mouth ?severe diarrhea ?swelling of the ankles, feet, hands ?trouble breathing ?Side effects that usually do not require medical attention (report to your d

## 2022-01-19 LAB — T4: T4, Total: 9.8 ug/dL (ref 4.5–12.0)

## 2022-02-02 NOTE — Progress Notes (Signed)
?[place]  ?[phone] [phone] ? ?ID: Scott Gross OB: Nov 29, 1933  MR#: [id number]  DTO#:[id number] ? ?Patient Care Team: ?[name], MD as PCP - General (Family Medicine) ?[name], MD as Consulting Physician (Oncology) ? ? ?CHIEF COMPLAINT: Progressive adenocarcinoma of upper lobe of left lung. ? ?INTERVAL HISTORY: Patient returns to clinic today for further evaluation and consideration of cycle 4 of single agent Keytruda.  He complains of right foot pain, but denies any injury or trauma.  He otherwise feels well.  He does not complain of any weakness or fatigue today.  He does not complain of back pain today.  He continues to have a poor appetite.  He has no neurologic complaints. He denies any chest pain, shortness of breath, cough, or hemoptysis.  He has no nausea, vomiting, constipation, or diarrhea. He has no urinary complaints.  Patient offers no further specific complaints today. ? ?REVIEW OF SYSTEMS:   ?Review of Systems  ?Constitutional: Negative.  Negative for fever, malaise/fatigue and weight loss.  ?Respiratory: Negative.  Negative for cough, hemoptysis and shortness of breath.   ?Cardiovascular: Negative.  Negative for chest pain and leg swelling.  ?Gastrointestinal: Negative.  Negative for abdominal pain.  ?Genitourinary: Negative.  Negative for dysuria.  ?Musculoskeletal:  Positive for back pain and joint pain.  ?Skin: Negative.  Negative for rash.  ?Neurological: Negative.  Negative for sensory change, focal weakness, weakness and headaches.  ?Psychiatric/Behavioral: Negative.  The patient is not nervous/anxious.   ? ?As per HPI. Otherwise, a complete review of systems is negative. ? ?PAST MEDICAL HISTORY: ?Past Medical History:  ?Diagnosis Date  ? Arthritis   ? CAD (coronary artery disease) Nov. 12, 2012  ? Inferior ST elevation MI in November of 2012 with ventricular fibrillation arrest. Status post drug-eluting stent placement to  the mid LAD. Most recent cardiac catheterization in June of this year showed patent stent with minimal restenosis, 75% proximal LAD and 80% distal LAD which were unchanged from before. Mild disease in the left circumflex and moderate RCA disease. Ejection fraction was 55%.  ? Cancer of upper lobe of left lung ([place]) 11/2016  ? Rad tx's.  ? Cardiac arrest [facility]) Nov. 2012  ? x 2   ? Cardiac arrest - ventricular fibrillation   ? HOH (hard of hearing)   ? Left Hearing Aid  ? Hyperlipidemia   ? Hypertension   ? Post PTCA 2013  ? x2  ? ST elevation MI (STEMI) ([place]) 08/26/2011  ? ? ?PAST SURGICAL HISTORY: ?Past Surgical History:  ?Procedure Laterality Date  ? CARDIAC CATHETERIZATION  2013  ? @ [place]; [facility] s/p stent   ? CORONARY ANGIOPLASTY    ? CORONARY/GRAFT ACUTE MI REVASCULARIZATION N/A 06/06/2021  ? Procedure: Coronary/Graft Acute MI Revascularization;  Surgeon: Wellington Hampshire, MD;  Location: [place] [facility];  Service: Cardiovascular;  Laterality: N/A;  ? ELECTROMAGNETIC NAVIGATION BROCHOSCOPY N/A 11/27/2016  ? Procedure: ELECTROMAGNETIC NAVIGATION BRONCHOSCOPY;  Surgeon: [name], MD;  Location: [facility];  Service: Cardiopulmonary;  Laterality: N/A;  ? LEFT HEART CATH AND CORONARY ANGIOGRAPHY N/A 06/06/2021  ? Procedure: LEFT HEART CATH AND CORONARY ANGIOGRAPHY;  Surgeon: Wellington Hampshire, MD;  Location: [place] [facility];  Service: Cardiovascular;  Laterality: N/A;  ? ? ?FAMILY HISTORY: ?Family History  ?Problem Relation Age of Onset  ? Heart attack Mother   ? Heart attack Father   ? ? ?ADVANCED DIRECTIVES (Y/N):  N ? ?HEALTH MAINTENANCE: ?Social History  ? ?  Tobacco Use  ? Smoking status: Former  ?  Packs/day: 1.00  ?  Types: Cigarettes  ?  Quit date: 08/27/2011  ?  Years since quitting: 10.4  ? Smokeless tobacco: Never  ?Vaping Use  ? Vaping Use: Never used  ?Substance Use Topics  ? Alcohol use: No  ? Drug use: No  ? ? ? Colonoscopy: ? PAP: ? Bone density: ? Lipid panel: ? ?Allergies  ?Allergen  Reactions  ? Statins Other (See Comments)  ?  Arthralagia  ? ? ?Current Outpatient Medications  ?Medication Sig Dispense Refill  ? albuterol (VENTOLIN HFA) 108 (90 Base) MCG/ACT inhaler Inhale into the lungs every 6 (six) hours as needed.    ? amiodarone (PACERONE) 200 MG tablet Take 1 tablet (200 mg total) by mouth 2 (two) times daily. (Patient taking differently: Take 100 mg by mouth daily.) 60 tablet 0  ? amLODipine (NORVASC) 5 MG tablet Take by mouth.    ? apixaban (ELIQUIS) 5 MG TABS tablet Take 1 tablet (5 mg total) by mouth 2 (two) times daily. 60 tablet 1  ? clopidogrel (PLAVIX) 75 MG tablet Take 75 mg by mouth daily.    ? losartan-hydrochlorothiazide (HYZAAR) 50-12.5 MG tablet Take 1 tablet by mouth daily.    ? rosuvastatin (CRESTOR) 5 MG tablet Take 2.5 mg by mouth daily.    ? torsemide (DEMADEX) 20 MG tablet Take 1 tablet (20 mg total) by mouth daily. 30 tablet 0  ? potassium chloride SA (KLOR-CON) 20 MEQ tablet Take by mouth. (Patient not taking: Reported on 02/08/2022)    ? traMADol (ULTRAM) 50 MG tablet Take 1 tablet (50 mg total) by mouth 2 (two) times daily. As needed for pain. (Patient not taking: Reported on 02/08/2022) 60 tablet 0  ? ?No current facility-administered medications for this visit.  ? ? ?OBJECTIVE: ?Vitals:  ? 02/08/22 1015  ?BP: 127/61  ?Pulse: 75  ?Resp: 16  ?Temp: (!) 97.1 ?F (36.2 ?C)  ?SpO2: 100%  ?   Body mass index is 17.81 kg/m?Marland Kitchen    ECOG FS:0 - Asymptomatic ? ?General: Well-developed, well-nourished, no acute distress. ?Eyes: Pink conjunctiva, anicteric sclera. ?HEENT: Normocephalic, moist mucous membranes. ?Lungs: No audible wheezing or coughing. ?Heart: Regular rate and rhythm. ?Abdomen: Soft, nontender, no obvious distention. ?Musculoskeletal: No edema, cyanosis, or clubbing. ?Neuro: Alert, answering all questions appropriately. Cranial nerves grossly intact. ?Skin: No rashes or petechiae noted. ?Psych: Normal affect. ? ?LAB RESULTS: ? ?Lab Results  ?Component Value Date  ?  NA 133 (L) 02/08/2022  ? K 3.2 (L) 02/08/2022  ? CL 96 (L) 02/08/2022  ? CO2 29 02/08/2022  ? GLUCOSE 163 (H) 02/08/2022  ? BUN 13 02/08/2022  ? CREATININE 0.86 02/08/2022  ? CALCIUM 8.5 (L) 02/08/2022  ? PROT 7.2 02/08/2022  ? ALBUMIN 3.9 02/08/2022  ? AST 32 02/08/2022  ? ALT 18 02/08/2022  ? ALKPHOS 284 (H) 02/08/2022  ? BILITOT 0.6 02/08/2022  ? GFRNONAA >60 02/08/2022  ? GFRAA >60 03/15/2020  ? ? ?Lab Results  ?Component Value Date  ? WBC 13.9 (H) 02/08/2022  ? NEUTROABS 11.4 (H) 02/08/2022  ? HGB 14.5 02/08/2022  ? HCT 43.2 02/08/2022  ? MCV 85.5 02/08/2022  ? PLT 220 02/08/2022  ? ? ? ?STUDIES: ?DG Foot Complete Right ? ?Result Date: 02/09/2022 ?CLINICAL DATA:  Right foot pain. No reported injury. History of arthritis. EXAM: RIGHT FOOT COMPLETE - 3+ VIEW COMPARISON:  None FINDINGS: Normal bone mineralization. Minimal degenerative spurring at the lateral aspect of  the great toe metatarsophalangeal joint. Mild-to-moderate distal dorsal talar degenerative spurring. Well-circumscribed lucent lesion with small central calcific versus ossific density within the anterior process of the calcaneus measuring up to approximately 2.2 cm in AP dimension with benign narrow zone of transition. No acute fracture or dislocation. IMPRESSION: Minimal great toe metatarsophalangeal joint osteoarthritis. Benign lucent lesion with narrow zone of transition within the anterior process of the calcaneus. This is favored to represent an intraosseous lipoma especially given the central calcification that may represent sclerosis. Unicameral bone cyst can have a similar lucent appearance with narrow zone of transition. Electronically Signed   By: Yvonne Kendall M.D.   On: 02/09/2022 10:20   ? ? ?ONCOLOGY HISTORY:  Biopsy of lung nodule on November 27, 2016 revealed adenocarcinoma, but there was no invasive component on the sample. Biopsy of mediastinal lymph node had insufficient material. Patient ultimately declined any further biopsies or  surgery and elected to proceed with XRT alone which he completed in April 2018.  PET scan on January 22, 2019 reviewed independently with increased metabolic activity prior to previous with SUV increasing from 6

## 2022-02-08 ENCOUNTER — Ambulatory Visit
Admission: RE | Admit: 2022-02-08 | Discharge: 2022-02-08 | Disposition: A | Discharge status: Home / Self Care | Payer: Medicare HMO | Attending: Oncology | Admitting: Oncology

## 2022-02-08 ENCOUNTER — Ambulatory Visit
Admission: RE | Admit: 2022-02-08 | Discharge: 2022-02-08 | Disposition: A | Discharge status: Home / Self Care | Payer: Medicare HMO | Source: Ambulatory Visit | Attending: Oncology | Admitting: Oncology

## 2022-02-08 ENCOUNTER — Inpatient Hospital Stay: Payer: Medicare HMO

## 2022-02-08 ENCOUNTER — Other Ambulatory Visit: Payer: Self-pay | Admitting: Oncology

## 2022-02-08 ENCOUNTER — Inpatient Hospital Stay: Payer: Medicare HMO | Admitting: Oncology

## 2022-02-08 VITALS — BP 133/71 | HR 68

## 2022-02-08 VITALS — BP 127/61 | HR 75 | Temp 97.1°F | Resp 16 | Ht 71.0 in | Wt 127.7 lb

## 2022-02-08 DIAGNOSIS — M79671 Pain in right foot: Secondary | ICD-10-CM

## 2022-02-08 DIAGNOSIS — Z5112 Encounter for antineoplastic immunotherapy: Secondary | ICD-10-CM | POA: Diagnosis not present

## 2022-02-08 DIAGNOSIS — C3412 Malignant neoplasm of upper lobe, left bronchus or lung: Secondary | ICD-10-CM

## 2022-02-08 DIAGNOSIS — Z79899 Other long term (current) drug therapy: Secondary | ICD-10-CM | POA: Diagnosis not present

## 2022-02-08 DIAGNOSIS — C7951 Secondary malignant neoplasm of bone: Secondary | ICD-10-CM | POA: Diagnosis not present

## 2022-02-08 LAB — COMPREHENSIVE METABOLIC PANEL
ALT: 18 U/L (ref 0–44)
AST: 32 U/L (ref 15–41)
Albumin: 3.9 g/dL (ref 3.5–5.0)
Alkaline Phosphatase: 284 U/L — ABNORMAL HIGH (ref 38–126)
Anion gap: 8 (ref 5–15)
BUN: 13 mg/dL (ref 8–23)
CO2: 29 mmol/L (ref 22–32)
Calcium: 8.5 mg/dL — ABNORMAL LOW (ref 8.9–10.3)
Chloride: 96 mmol/L — ABNORMAL LOW (ref 98–111)
Creatinine, Ser: 0.86 mg/dL (ref 0.61–1.24)
GFR, Estimated: >60 mL/min (ref >60)
Glucose, Bld: 163 mg/dL — ABNORMAL HIGH (ref 70–99)
Potassium: 3.2 mmol/L — ABNORMAL LOW (ref 3.5–5.1)
Sodium: 133 mmol/L — ABNORMAL LOW (ref 135–145)
Total Bilirubin: 0.6 mg/dL (ref 0.3–1.2)
Total Protein: 7.2 g/dL (ref 6.5–8.1)

## 2022-02-08 LAB — CBC WITH DIFFERENTIAL/PLATELET
Abs Immature Granulocytes: 0.12 10*3/uL — ABNORMAL HIGH (ref 0.00–0.07)
Basophils Absolute: 0.1 10*3/uL (ref 0.0–0.1)
Basophils Relative: 1 %
Eosinophils Absolute: 0.2 10*3/uL (ref 0.0–0.5)
Eosinophils Relative: 2 %
HCT: 43.2 % (ref 39.0–52.0)
Hemoglobin: 14.5 g/dL (ref 13.0–17.0)
Immature Granulocytes: 1 %
Lymphocytes Relative: 8 %
Lymphs Abs: 1.1 10*3/uL (ref 0.7–4.0)
MCH: 28.7 pg (ref 26.0–34.0)
MCHC: 33.6 g/dL (ref 30.0–36.0)
MCV: 85.5 fL (ref 80.0–100.0)
Monocytes Absolute: 1 10*3/uL (ref 0.1–1.0)
Monocytes Relative: 7 %
Neutro Abs: 11.4 10*3/uL — ABNORMAL HIGH (ref 1.7–7.7)
Neutrophils Relative %: 81 %
Platelets: 220 10*3/uL (ref 150–400)
RBC: 5.05 MIL/uL (ref 4.22–5.81)
RDW: 13.6 % (ref 11.5–15.5)
WBC: 13.9 10*3/uL — ABNORMAL HIGH (ref 4.0–10.5)
nRBC: 0 % (ref 0.0–0.2)

## 2022-02-08 LAB — TSH: TSH: 2.062 u[IU]/mL (ref 0.350–4.500)

## 2022-02-08 MED ORDER — SODIUM CHLORIDE 0.9 % IV SOLN
200.0000 mg | Freq: Once | INTRAVENOUS | Status: AC
  Administered 2022-02-08: 200 mg via INTRAVENOUS
  Filled 2022-02-08: qty 8

## 2022-02-08 MED ORDER — SODIUM CHLORIDE 0.9 % IV SOLN
Freq: Once | INTRAVENOUS | Status: AC
  Filled 2022-02-08: qty 250

## 2022-02-08 MED ORDER — HEPARIN SOD (PORK) LOCK FLUSH 100 UNIT/ML IV SOLN
500.0000 [IU] | Freq: Once | INTRAVENOUS | Status: DC | PRN
  Filled 2022-02-08: qty 5

## 2022-02-08 NOTE — Patient Instructions (Signed)
[facility]  Discharge Instructions: °Thank you for choosing [facility] to provide your oncology and hematology care.  ° °If you have a lab appointment with the Cancer Center, please go directly to the Cancer Center and check in at the registration area. °  °Wear comfortable clothing and clothing appropriate for easy access to any Portacath or PICC line.  ° °We strive to give you quality time with your provider. You may need to reschedule your appointment if you arrive late (15 or more minutes).  Arriving late affects you and other patients whose appointments are after yours.  Also, if you miss three or more appointments without notifying the office, you may be dismissed from the clinic at the provider’s discretion.    °  °For prescription refill requests, have your pharmacy contact our office and allow 72 hours for refills to be completed.   ° °Today you received the following chemotherapy and/or immunotherapy agents     °  °To help prevent nausea and vomiting after your treatment, we encourage you to take your nausea medication as directed. ° °BELOW ARE SYMPTOMS THAT SHOULD BE REPORTED IMMEDIATELY: °*FEVER GREATER THAN 100.4 F (38 °C) OR HIGHER °*CHILLS OR SWEATING °*NAUSEA AND VOMITING THAT IS NOT CONTROLLED WITH YOUR NAUSEA MEDICATION °*UNUSUAL SHORTNESS OF BREATH °*UNUSUAL BRUISING OR BLEEDING °*URINARY PROBLEMS (pain or burning when urinating, or frequent urination) °*BOWEL PROBLEMS (unusual diarrhea, constipation, pain near the anus) °TENDERNESS IN MOUTH AND THROAT WITH OR WITHOUT PRESENCE OF ULCERS (sore throat, sores in mouth, or a toothache) °UNUSUAL RASH, SWELLING OR PAIN  °UNUSUAL VAGINAL DISCHARGE OR ITCHING  ° °Items with * indicate a potential emergency and should be followed up as soon as possible or go to the Emergency Department if any problems should occur. ° °Please show the CHEMOTHERAPY ALERT CARD or IMMUNOTHERAPY ALERT CARD at check-in to the  Emergency Department and triage nurse. ° °Should you have questions after your visit or need to cancel or reschedule your appointment, please contact [facility]  Dept: [phone]  and follow the prompts.  Office hours are 8:00 a.m. to 4:30 p.m. Monday - Friday. Please note that voicemails left after 4:00 p.m. may not be returned until the following business day.  We are closed weekends and major holidays. You have access to a nurse at all times for urgent questions. Please call the main number to the clinic Dept: [phone] and follow the prompts. ° ° °For any non-urgent questions, you may also contact your provider using MyChart. We now offer e-Visits for anyone 86 and older to request care online for non-urgent symptoms. For details visit [email or web address]. °  °Also download the MyChart app! Go to the app store, search "MyChart", open the app, select [facility], and log in with your MyChart username and password. ° °Due to Covid, a mask is required upon entering the hospital/clinic. If you do not have a mask, one will be given to you upon arrival. For doctor visits, patients may have 1 support person aged 86 or older with them. For treatment visits, patients cannot have anyone with them due to current Covid guidelines and our immunocompromised population.  ° °

## 2022-02-08 NOTE — Progress Notes (Signed)
Pt reports pain across top of right foot. Denies injury. Pt wife asking about another PET scan. ?

## 2022-02-09 ENCOUNTER — Encounter: Payer: Self-pay | Admitting: Oncology

## 2022-02-09 LAB — T4: T4, Total: 11.5 ug/dL (ref 4.5–12.0)

## 2022-02-15 ENCOUNTER — Other Ambulatory Visit: Payer: Self-pay | Admitting: Emergency Medicine

## 2022-02-15 DIAGNOSIS — M79671 Pain in right foot: Secondary | ICD-10-CM

## 2022-02-15 MED ORDER — MELOXICAM 15 MG PO TABS: 15.0000 mg | ORAL_TABLET | Freq: Every day | ORAL | 0 refills | Status: DC | PRN

## 2022-02-21 ENCOUNTER — Ambulatory Visit: Payer: Medicare HMO | Admitting: Radiation Oncology

## 2022-02-25 NOTE — Progress Notes (Signed)
Scott Gross  [phone] [phone]  ID: COURT GRACIA OB: 1934-09-10  MR#: [id number]  BWG#:[id number]  Patient Care Team: [name], MD as PCP - General (Family Medicine) [name], MD as Consulting Physician (Oncology)   CHIEF COMPLAINT: Progressive adenocarcinoma of upper lobe of left lung.  INTERVAL HISTORY: Patient returns to clinic today for further evaluation, discussion of his imaging results, and consideration of cycle 5 of single agent Keytruda.  He no longer complains of right foot pain, but now states he has some neuropathy making him "unsteady".  He otherwise feels well.  He does not complain of any weakness or fatigue today.  He does not complain of back pain today.  He continues to have a poor appetite.  He has no neurologic complaints. He denies any chest pain, shortness of breath, cough, or hemoptysis.  He has no nausea, vomiting, constipation, or diarrhea. He has no urinary complaints.  Patient offers no further specific complaints today.  REVIEW OF SYSTEMS:   Review of Systems  Constitutional: Negative.  Negative for fever, malaise/fatigue and weight loss.  Respiratory: Negative.  Negative for cough, hemoptysis and shortness of breath.   Cardiovascular: Negative.  Negative for chest pain and leg swelling.  Gastrointestinal: Negative.  Negative for abdominal pain.  Genitourinary: Negative.  Negative for dysuria.  Musculoskeletal:  Positive for back pain and joint pain.  Skin: Negative.  Negative for rash.  Neurological: Negative.  Negative for sensory change, focal weakness, weakness and headaches.  Psychiatric/Behavioral: Negative.  The patient is not nervous/anxious.    As per HPI. Otherwise, a complete review of systems is negative.  PAST MEDICAL HISTORY: Past Medical History:  Diagnosis Date   Arthritis    CAD (coronary artery disease) Nov. 12, 2012   Inferior ST elevation MI in November of 2012 with  ventricular fibrillation arrest. Status post drug-eluting stent placement to the mid LAD. Most recent cardiac catheterization in June of this year showed patent stent with minimal restenosis, 75% proximal LAD and 80% distal LAD which were unchanged from before. Mild disease in the left circumflex and moderate RCA disease. Ejection fraction was 55%.   Cancer of upper lobe of left lung ([place]) 11/2016   Rad tx's.   Cardiac arrest [facility]) Nov. 2012   x 2    Cardiac arrest - ventricular fibrillation    HOH (hard of hearing)    Left Hearing Aid   Hyperlipidemia    Hypertension    Post PTCA 2013   x2   ST elevation MI (STEMI) ([place]) 08/26/2011    PAST SURGICAL HISTORY: Past Surgical History:  Procedure Laterality Date   CARDIAC CATHETERIZATION  2013   @ [place]; [place] s/p stent    CORONARY ANGIOPLASTY     CORONARY/GRAFT ACUTE MI REVASCULARIZATION N/A 06/06/2021   Procedure: Coronary/Graft Acute MI Revascularization;  Surgeon: Wellington Hampshire, MD;  Location: [place] [facility];  Service: Cardiovascular;  Laterality: N/A;   ELECTROMAGNETIC NAVIGATION BROCHOSCOPY N/A 11/27/2016   Procedure: ELECTROMAGNETIC NAVIGATION BRONCHOSCOPY;  Surgeon: [name], MD;  Location: [facility];  Service: Cardiopulmonary;  Laterality: N/A;   LEFT HEART CATH AND CORONARY ANGIOGRAPHY N/A 06/06/2021   Procedure: LEFT HEART CATH AND CORONARY ANGIOGRAPHY;  Surgeon: Wellington Hampshire, MD;  Location: [place] [facility];  Service: Cardiovascular;  Laterality: N/A;    FAMILY HISTORY: Family History  Problem Relation Age of Onset   Heart attack Mother    Heart attack Father  ADVANCED DIRECTIVES (Y/N):  N  HEALTH MAINTENANCE: Social History   Tobacco Use   Smoking status: Former    Packs/day: 1.00    Types: Cigarettes    Quit date: 08/27/2011    Years since quitting: 10.5   Smokeless tobacco: Never  Vaping Use   Vaping Use: Never used  Substance Use Topics   Alcohol use: No   Drug use: No      Colonoscopy:  PAP:  Bone density:  Lipid panel:  Allergies  Allergen Reactions   Statins Other (See Comments)    Arthralagia    Current Outpatient Medications  Medication Sig Dispense Refill   albuterol (VENTOLIN HFA) 108 (90 Base) MCG/ACT inhaler Inhale into the lungs every 6 (six) hours as needed.     amiodarone (PACERONE) 200 MG tablet Take 1 tablet (200 mg total) by mouth 2 (two) times daily. (Patient taking differently: Take 100 mg by mouth daily.) 60 tablet 0   amLODipine (NORVASC) 5 MG tablet Take by mouth.     apixaban (ELIQUIS) 5 MG TABS tablet Take 1 tablet (5 mg total) by mouth 2 (two) times daily. 60 tablet 1   clopidogrel (PLAVIX) 75 MG tablet Take 75 mg by mouth daily.     losartan-hydrochlorothiazide (HYZAAR) 50-12.5 MG tablet Take 1 tablet by mouth daily.     meloxicam (MOBIC) 15 MG tablet Take 1 tablet (15 mg total) by mouth daily as needed for pain. 30 tablet 0   potassium chloride SA (KLOR-CON) 20 MEQ tablet Take by mouth.     rosuvastatin (CRESTOR) 5 MG tablet Take 2.5 mg by mouth daily.     torsemide (DEMADEX) 20 MG tablet Take 1 tablet (20 mg total) by mouth daily. 30 tablet 0   traMADol (ULTRAM) 50 MG tablet Take 1 tablet (50 mg total) by mouth 2 (two) times daily. As needed for pain. (Patient not taking: Reported on 02/08/2022) 60 tablet 0   No current facility-administered medications for this visit.    OBJECTIVE: Vitals:   03/01/22 0900  BP: 132/76  Pulse: 73  Resp: 16  Temp: (!) 96.3 F (35.7 C)  SpO2: 100%     Body mass index is 17.43 kg/m.    ECOG FS:0 - Asymptomatic  General: Well-developed, well-nourished, no acute distress. Eyes: Pink conjunctiva, anicteric sclera. HEENT: Normocephalic, moist mucous membranes. Lungs: No audible wheezing or coughing. Heart: Regular rate and rhythm. Abdomen: Soft, nontender, no obvious distention. Musculoskeletal: No edema, cyanosis, or clubbing. Neuro: Alert, answering all questions appropriately.  Cranial nerves grossly intact. Skin: No rashes or petechiae noted. Psych: Normal affect.   LAB RESULTS:  Lab Results  Component Value Date   NA 133 (L) 03/01/2022   K 3.4 (L) 03/01/2022   CL 96 (L) 03/01/2022   CO2 29 03/01/2022   GLUCOSE 139 (H) 03/01/2022   BUN 11 03/01/2022   CREATININE 0.98 03/01/2022   CALCIUM 8.2 (L) 03/01/2022   PROT 6.9 03/01/2022   ALBUMIN 3.7 03/01/2022   AST 25 03/01/2022   ALT 15 03/01/2022   ALKPHOS 335 (H) 03/01/2022   BILITOT 0.8 03/01/2022   GFRNONAA >60 03/01/2022   GFRAA >60 03/15/2020    Lab Results  Component Value Date   WBC 11.7 (H) 03/01/2022   NEUTROABS 8.9 (H) 03/01/2022   HGB 14.0 03/01/2022   HCT 42.5 03/01/2022   MCV 84.3 03/01/2022   PLT 233 03/01/2022     STUDIES: CT CHEST ABDOMEN PELVIS W CONTRAST  Result Date: 02/27/2022 CLINICAL DATA:  Left upper lobe lung cancer . Bilateral upper back and shoulder blade pain for 10 years. Adenocarcinoma primary. * Tracking Code: BO * EXAM: CT CHEST, ABDOMEN, AND PELVIS WITH CONTRAST TECHNIQUE: Multidetector CT imaging of the chest, abdomen and pelvis was performed following the standard protocol during bolus administration of intravenous contrast. RADIATION DOSE REDUCTION: This exam was performed according to the departmental dose-optimization program which includes automated exposure control, adjustment of the mA and/or kV according to patient size and/or use of iterative reconstruction technique. CONTRAST:  51m OMNIPAQUE IOHEXOL 300 MG/ML  SOLN COMPARISON:  CT of 11/23/2021 and PET of 08/17/2021. FINDINGS: CT CHEST FINDINGS Cardiovascular: Aortic atherosclerosis. Tortuous thoracic aorta. Normal heart size, without pericardial effusion. Three vessel coronary artery calcification. No central pulmonary embolism, on this non-dedicated study. Mediastinum/Nodes: No supraclavicular adenopathy. No mediastinal or hilar adenopathy. Contrast level in the esophagus. Lungs/Pleura: Resolved pleural  effusions. Chronic collapse of a portion of the posterior left upper lobe with radiation fiducials within. No local recurrence. Innumerable bilateral pulmonary nodules. Index 6 mm left lower lobe sub solid nodule on 64/3 measured 5 mm on the prior exam (when remeasured). Just caudal to this, a sub solid nodule measures 5 mm on 61/3 and is similar to on the prior exam (when remeasured). Left lower lobe cavitary lesion measures 7 mm on 115/3 versus 5 mm on the prior (when remeasured). Two adjacent right lower lobe pulmonary nodules measure maximally 5 mm on 108/3 versus 4 mm on the prior (when remeasured). Musculoskeletal: Multifocal sclerotic osseous metastasis are increased as detailed below. CT ABDOMEN PELVIS FINDINGS Hepatobiliary: Normal liver. Tiny dependent gallstone suspected. No acute cholecystitis or biliary duct dilatation. Pancreas: Normal, without mass or ductal dilatation. Spleen: Normal in size, without focal abnormality. Adrenals/Urinary Tract: Normal adrenal glands. Normal kidneys, without hydronephrosis. Normal urinary bladder. Stomach/Bowel: Normal stomach, without wall thickening. Normal colon, appendix, and terminal ileum. Normal small bowel. Vascular/Lymphatic: Aortic atherosclerosis. No abdominopelvic adenopathy. Reproductive: Mild prostatomegaly. Other: No significant free fluid. No evidence of omental or peritoneal disease. Musculoskeletal: Left iliac sclerotic lesion of 7 mm on 96/2 is enlarged from 3-4 mm on the prior. Increased sclerosis involving the posterior right iliac. Eccentric left L2 vertebral body lesion with increased superior endplate irregularity including on 86/6. An anterior L3 sclerotic lesion of 7 mm on sagittal image 75 is increased from 5 mm on the prior exam (when remeasured). S shaped thoracolumbar spine curvature. IMPRESSION: CT CHEST IMPRESSION 1. Mild progression of diffuse pulmonary nodules indicative of metastasis. 2. Chronic left upper lobe therapy induced partial  collapse, without local recurrence. 3. No thoracic adenopathy. 4. Resolved bilateral pleural effusions. 5. Esophageal air fluid level suggests dysmotility or gastroesophageal reflux. 6. Coronary artery atherosclerosis. Aortic Atherosclerosis (ICD10-I70.0). CT ABDOMEN AND PELVIS IMPRESSION 1. No soft tissue metastasis within the abdomen or pelvis. 2. Progressive osseous metastasis. 3. Probable cholelithiasis. Electronically Signed   By: KAbigail MiyamotoM.D.   On: 02/27/2022 13:44   DG Foot Complete Right  Result Date: 02/09/2022 CLINICAL DATA:  Right foot pain. No reported injury. History of arthritis. EXAM: RIGHT FOOT COMPLETE - 3+ VIEW COMPARISON:  None FINDINGS: Normal bone mineralization. Minimal degenerative spurring at the lateral aspect of the great toe metatarsophalangeal joint. Mild-to-moderate distal dorsal talar degenerative spurring. Well-circumscribed lucent lesion with small central calcific versus ossific density within the anterior process of the calcaneus measuring up to approximately 2.2 cm in AP dimension with benign narrow zone of transition. No acute fracture or dislocation. IMPRESSION: Minimal great toe metatarsophalangeal  joint osteoarthritis. Benign lucent lesion with narrow zone of transition within the anterior process of the calcaneus. This is favored to represent an intraosseous lipoma especially given the central calcification that may represent sclerosis. Unicameral bone cyst can have a similar lucent appearance with narrow zone of transition. Electronically Signed   By: Yvonne Kendall M.D.   On: 02/09/2022 10:20     ONCOLOGY HISTORY:  Biopsy of lung nodule on November 27, 2016 revealed adenocarcinoma, but there was no invasive component on the sample. Biopsy of mediastinal lymph node had insufficient material. Patient ultimately declined any further biopsies or surgery and elected to proceed with XRT alone which he completed in April 2018.  PET scan on January 22, 2019 reviewed  independently with increased metabolic activity prior to previous with SUV increasing from 6.5 up to 8.8.  Patient declined intervention at that time and elected to proceed with simple observation.  Repeat CT scan on April 21, 2019 reviewed independently with progressive 6.4 cm mass in the left upper lobe.  Patient declined systemic treatment, but agreed to proceed with XRT which she completed in approximately August 2020.  CT scan on September 23, 2019 reviewed independently with no obvious evidence of recurrent or progressive disease.   ASSESSMENT: Progressive adenocarcinoma of upper lobe of left lung   PLAN:   1.  Progressive adenocarcinoma of upper lobe of left lung: PET scan results from August 17, 2021 reviewed independently with interval continued progression of bony metastatic disease and a new hypermetabolic bone lesion in the right sacrum.  Bilateral pulmonary nodules were stable, but concerning for metastatic involvement.  At that time we discussed systemic treatment, but patient and his wife elected for continued active surveillance.  Previously he stated he would not want aggressive chemotherapy but would consider immunotherapy if needed despite PD-L1 being unknown.  Repeat CT scan on Feb 27, 2022 reviewed independently and reported as above with only minimal progression of disease after only 4 infusions of Keytruda.  Continue treatment as planned, but patient does not wish to have treatment today therefore delay cycle five 1 week.  Return to clinic in 1 week for further evaluation and reconsideration of cycle 5.  Patient also received Zometa on odd-numbered cycles. 2.  Shoulder/back pain: Patient does not complain of this today.  Previously, patient reported this has been evident for years/decades.  Continue tramadol as needed.   3.  Shortness of breath/CHF: Patient does not complain of this today.  Follow-up with cardiology as needed. 4.  Bony disease: Patient receives Zometa on odd-numbered  cycles as above. 5.  Leukocytosis: Chronic and unchanged. 6.  Hyponatremia: Chronic and unchanged.   7.  Hypokalemia: Chronic and unchanged.  Continue oral supplementation. 8.  Foot pain/neuropathy: X-ray from February 09, 2022 revealed joint osteoarthritis.  Patient was also given a referral to Occupational Therapy for his "unsteadiness".   Patient expressed understanding and was in agreement with this plan. He also understands that He can call clinic at any time with any questions, concerns, or complaints.     [name], MD   03/03/2022 6:35 AM

## 2022-02-26 ENCOUNTER — Ambulatory Visit
Admission: RE | Admit: 2022-02-26 | Discharge: 2022-02-26 | Disposition: A | Discharge status: Home / Self Care | Payer: Medicare HMO | Source: Ambulatory Visit | Attending: Oncology | Admitting: Oncology

## 2022-02-26 DIAGNOSIS — M25511 Pain in right shoulder: Secondary | ICD-10-CM | POA: Diagnosis not present

## 2022-02-26 DIAGNOSIS — C7951 Secondary malignant neoplasm of bone: Secondary | ICD-10-CM | POA: Diagnosis not present

## 2022-02-26 DIAGNOSIS — C3412 Malignant neoplasm of upper lobe, left bronchus or lung: Secondary | ICD-10-CM | POA: Diagnosis not present

## 2022-02-26 DIAGNOSIS — J90 Pleural effusion, not elsewhere classified: Secondary | ICD-10-CM | POA: Diagnosis not present

## 2022-02-26 DIAGNOSIS — M25512 Pain in left shoulder: Secondary | ICD-10-CM | POA: Diagnosis not present

## 2022-02-26 DIAGNOSIS — I700 Atherosclerosis of aorta: Secondary | ICD-10-CM | POA: Diagnosis not present

## 2022-02-26 DIAGNOSIS — I2510 Atherosclerotic heart disease of native coronary artery without angina pectoris: Secondary | ICD-10-CM | POA: Diagnosis not present

## 2022-02-26 MED ORDER — IOHEXOL 300 MG/ML  SOLN
80.0000 mL | Freq: Once | INTRAMUSCULAR | Status: AC | PRN
  Administered 2022-02-26: 80 mL via INTRAVENOUS

## 2022-03-01 ENCOUNTER — Inpatient Hospital Stay: Payer: Medicare HMO

## 2022-03-01 ENCOUNTER — Inpatient Hospital Stay (HOSPITAL_BASED_OUTPATIENT_CLINIC_OR_DEPARTMENT_OTHER): Payer: Medicare HMO | Admitting: Oncology

## 2022-03-01 ENCOUNTER — Inpatient Hospital Stay: Payer: Medicare HMO | Attending: Nurse Practitioner

## 2022-03-01 ENCOUNTER — Encounter: Payer: Self-pay | Admitting: Oncology

## 2022-03-01 VITALS — BP 132/76 | HR 73 | Temp 96.3°F | Resp 16 | Ht 71.0 in | Wt 125.0 lb

## 2022-03-01 DIAGNOSIS — C3412 Malignant neoplasm of upper lobe, left bronchus or lung: Secondary | ICD-10-CM | POA: Insufficient documentation

## 2022-03-01 DIAGNOSIS — E871 Hypo-osmolality and hyponatremia: Secondary | ICD-10-CM | POA: Insufficient documentation

## 2022-03-01 DIAGNOSIS — C7951 Secondary malignant neoplasm of bone: Secondary | ICD-10-CM | POA: Diagnosis not present

## 2022-03-01 DIAGNOSIS — E876 Hypokalemia: Secondary | ICD-10-CM | POA: Diagnosis not present

## 2022-03-01 DIAGNOSIS — Z5112 Encounter for antineoplastic immunotherapy: Secondary | ICD-10-CM | POA: Insufficient documentation

## 2022-03-01 DIAGNOSIS — D72829 Elevated white blood cell count, unspecified: Secondary | ICD-10-CM | POA: Insufficient documentation

## 2022-03-01 DIAGNOSIS — Z79899 Other long term (current) drug therapy: Secondary | ICD-10-CM | POA: Diagnosis not present

## 2022-03-01 LAB — COMPREHENSIVE METABOLIC PANEL
ALT: 15 U/L (ref 0–44)
AST: 25 U/L (ref 15–41)
Albumin: 3.7 g/dL (ref 3.5–5.0)
Alkaline Phosphatase: 335 U/L — ABNORMAL HIGH (ref 38–126)
Anion gap: 8 (ref 5–15)
BUN: 11 mg/dL (ref 8–23)
CO2: 29 mmol/L (ref 22–32)
Calcium: 8.2 mg/dL — ABNORMAL LOW (ref 8.9–10.3)
Chloride: 96 mmol/L — ABNORMAL LOW (ref 98–111)
Creatinine, Ser: 0.98 mg/dL (ref 0.61–1.24)
GFR, Estimated: >60 mL/min (ref >60)
Glucose, Bld: 139 mg/dL — ABNORMAL HIGH (ref 70–99)
Potassium: 3.4 mmol/L — ABNORMAL LOW (ref 3.5–5.1)
Sodium: 133 mmol/L — ABNORMAL LOW (ref 135–145)
Total Bilirubin: 0.8 mg/dL (ref 0.3–1.2)
Total Protein: 6.9 g/dL (ref 6.5–8.1)

## 2022-03-01 LAB — CBC WITH DIFFERENTIAL/PLATELET
Abs Immature Granulocytes: 0.07 10*3/uL (ref 0.00–0.07)
Basophils Absolute: 0.1 10*3/uL (ref 0.0–0.1)
Basophils Relative: 1 %
Eosinophils Absolute: 0.3 10*3/uL (ref 0.0–0.5)
Eosinophils Relative: 3 %
HCT: 42.5 % (ref 39.0–52.0)
Hemoglobin: 14 g/dL (ref 13.0–17.0)
Immature Granulocytes: 1 %
Lymphocytes Relative: 12 %
Lymphs Abs: 1.4 10*3/uL (ref 0.7–4.0)
MCH: 27.8 pg (ref 26.0–34.0)
MCHC: 32.9 g/dL (ref 30.0–36.0)
MCV: 84.3 fL (ref 80.0–100.0)
Monocytes Absolute: 0.9 10*3/uL (ref 0.1–1.0)
Monocytes Relative: 8 %
Neutro Abs: 8.9 10*3/uL — ABNORMAL HIGH (ref 1.7–7.7)
Neutrophils Relative %: 75 %
Platelets: 233 10*3/uL (ref 150–400)
RBC: 5.04 MIL/uL (ref 4.22–5.81)
RDW: 14.5 % (ref 11.5–15.5)
WBC: 11.7 10*3/uL — ABNORMAL HIGH (ref 4.0–10.5)
nRBC: 0 % (ref 0.0–0.2)

## 2022-03-01 LAB — TSH: TSH: 2.305 u[IU]/mL (ref 0.350–4.500)

## 2022-03-02 ENCOUNTER — Encounter: Payer: Self-pay | Admitting: Oncology

## 2022-03-02 LAB — T4: T4, Total: 10 ug/dL (ref 4.5–12.0)

## 2022-03-03 ENCOUNTER — Encounter: Payer: Self-pay | Admitting: Oncology

## 2022-03-04 NOTE — Progress Notes (Unsigned)
[facility]  [phone] [phone]  ID: Scott Gross OB: 05-Jan-1934  MR#: [id number]  YTK#:[id number]  Patient Care Team: [name], MD as PCP - General (Family Medicine) [name], MD as Consulting Physician (Oncology)   CHIEF COMPLAINT: Progressive adenocarcinoma of upper lobe of left lung.  INTERVAL HISTORY: Patient returns to clinic today for further evaluation, discussion of his imaging results, and consideration of cycle 5 of single agent Keytruda.  He no longer complains of right foot pain, but now states he has some neuropathy making him "unsteady".  He otherwise feels well.  He does not complain of any weakness or fatigue today.  He does not complain of back pain today.  He continues to have a poor appetite.  He has no neurologic complaints. He denies any chest pain, shortness of breath, cough, or hemoptysis.  He has no nausea, vomiting, constipation, or diarrhea. He has no urinary complaints.  Patient offers no further specific complaints today.  REVIEW OF SYSTEMS:   Review of Systems  Constitutional: Negative.  Negative for fever, malaise/fatigue and weight loss.  Respiratory: Negative.  Negative for cough, hemoptysis and shortness of breath.   Cardiovascular: Negative.  Negative for chest pain and leg swelling.  Gastrointestinal: Negative.  Negative for abdominal pain.  Genitourinary: Negative.  Negative for dysuria.  Musculoskeletal:  Positive for back pain and joint pain.  Skin: Negative.  Negative for rash.  Neurological: Negative.  Negative for sensory change, focal weakness, weakness and headaches.  Psychiatric/Behavioral: Negative.  The patient is not nervous/anxious.    As per HPI. Otherwise, a complete review of systems is negative.  PAST MEDICAL HISTORY: Past Medical History:  Diagnosis Date   Arthritis    CAD (coronary artery disease) Nov. 12, 2012   Inferior ST elevation MI in November of 2012 with  ventricular fibrillation arrest. Status post drug-eluting stent placement to the mid LAD. Most recent cardiac catheterization in June of this year showed patent stent with minimal restenosis, 75% proximal LAD and 80% distal LAD which were unchanged from before. Mild disease in the left circumflex and moderate RCA disease. Ejection fraction was 55%.   Cancer of upper lobe of left lung ([place]) 11/2016   Rad tx's.   Cardiac arrest [facility]) Nov. 2012   x 2    Cardiac arrest - ventricular fibrillation    HOH (hard of hearing)    Left Hearing Aid   Hyperlipidemia    Hypertension    Post PTCA 2013   x2   ST elevation MI (STEMI) ([place]) 08/26/2011    PAST SURGICAL HISTORY: Past Surgical History:  Procedure Laterality Date   CARDIAC CATHETERIZATION  2013   @ [place]; [place] s/p stent    CORONARY ANGIOPLASTY     CORONARY/GRAFT ACUTE MI REVASCULARIZATION N/A 06/06/2021   Procedure: Coronary/Graft Acute MI Revascularization;  Surgeon: Wellington Hampshire, MD;  Location: [facility];  Service: Cardiovascular;  Laterality: N/A;   ELECTROMAGNETIC NAVIGATION BROCHOSCOPY N/A 11/27/2016   Procedure: ELECTROMAGNETIC NAVIGATION BRONCHOSCOPY;  Surgeon: [name], MD;  Location: [facility] ORS;  Service: Cardiopulmonary;  Laterality: N/A;   LEFT HEART CATH AND CORONARY ANGIOGRAPHY N/A 06/06/2021   Procedure: LEFT HEART CATH AND CORONARY ANGIOGRAPHY;  Surgeon: Wellington Hampshire, MD;  Location: [facility];  Service: Cardiovascular;  Laterality: N/A;    FAMILY HISTORY: Family History  Problem Relation Age of Onset   Heart attack Mother    Heart attack Father  ADVANCED DIRECTIVES (Y/N):  N  HEALTH MAINTENANCE: Social History   Tobacco Use   Smoking status: Former    Packs/day: 1.00    Types: Cigarettes    Quit date: 08/27/2011    Years since quitting: 10.5   Smokeless tobacco: Never  Vaping Use   Vaping Use: Never used  Substance Use Topics   Alcohol use: No   Drug use: No      Colonoscopy:  PAP:  Bone density:  Lipid panel:  Allergies  Allergen Reactions   Statins Other (See Comments)    Arthralagia    Current Outpatient Medications  Medication Sig Dispense Refill   albuterol (VENTOLIN HFA) 108 (90 Base) MCG/ACT inhaler Inhale into the lungs every 6 (six) hours as needed.     amiodarone (PACERONE) 200 MG tablet Take 1 tablet (200 mg total) by mouth 2 (two) times daily. (Patient taking differently: Take 100 mg by mouth daily.) 60 tablet 0   amLODipine (NORVASC) 5 MG tablet Take by mouth.     apixaban (ELIQUIS) 5 MG TABS tablet Take 1 tablet (5 mg total) by mouth 2 (two) times daily. 60 tablet 1   clopidogrel (PLAVIX) 75 MG tablet Take 75 mg by mouth daily.     losartan-hydrochlorothiazide (HYZAAR) 50-12.5 MG tablet Take 1 tablet by mouth daily.     meloxicam (MOBIC) 15 MG tablet Take 1 tablet (15 mg total) by mouth daily as needed for pain. 30 tablet 0   potassium chloride SA (KLOR-CON) 20 MEQ tablet Take by mouth.     rosuvastatin (CRESTOR) 5 MG tablet Take 2.5 mg by mouth daily.     torsemide (DEMADEX) 20 MG tablet Take 1 tablet (20 mg total) by mouth daily. 30 tablet 0   traMADol (ULTRAM) 50 MG tablet Take 1 tablet (50 mg total) by mouth 2 (two) times daily. As needed for pain. (Patient not taking: Reported on 02/08/2022) 60 tablet 0   No current facility-administered medications for this visit.    OBJECTIVE: There were no vitals filed for this visit.    There is no height or weight on file to calculate BMI.    ECOG FS:0 - Asymptomatic  General: Well-developed, well-nourished, no acute distress. Eyes: Pink conjunctiva, anicteric sclera. HEENT: Normocephalic, moist mucous membranes. Lungs: No audible wheezing or coughing. Heart: Regular rate and rhythm. Abdomen: Soft, nontender, no obvious distention. Musculoskeletal: No edema, cyanosis, or clubbing. Neuro: Alert, answering all questions appropriately. Cranial nerves grossly intact. Skin: No  rashes or petechiae noted. Psych: Normal affect.   LAB RESULTS:  Lab Results  Component Value Date   NA 133 (L) 03/01/2022   K 3.4 (L) 03/01/2022   CL 96 (L) 03/01/2022   CO2 29 03/01/2022   GLUCOSE 139 (H) 03/01/2022   BUN 11 03/01/2022   CREATININE 0.98 03/01/2022   CALCIUM 8.2 (L) 03/01/2022   PROT 6.9 03/01/2022   ALBUMIN 3.7 03/01/2022   AST 25 03/01/2022   ALT 15 03/01/2022   ALKPHOS 335 (H) 03/01/2022   BILITOT 0.8 03/01/2022   GFRNONAA >60 03/01/2022   GFRAA >60 03/15/2020    Lab Results  Component Value Date   WBC 11.7 (H) 03/01/2022   NEUTROABS 8.9 (H) 03/01/2022   HGB 14.0 03/01/2022   HCT 42.5 03/01/2022   MCV 84.3 03/01/2022   PLT 233 03/01/2022     STUDIES: CT CHEST ABDOMEN PELVIS W CONTRAST  Result Date: 02/27/2022 CLINICAL DATA:  Left upper lobe lung cancer . Bilateral upper back and shoulder  blade pain for 10 years. Adenocarcinoma primary. * Tracking Code: BO * EXAM: CT CHEST, ABDOMEN, AND PELVIS WITH CONTRAST TECHNIQUE: Multidetector CT imaging of the chest, abdomen and pelvis was performed following the standard protocol during bolus administration of intravenous contrast. RADIATION DOSE REDUCTION: This exam was performed according to the departmental dose-optimization program which includes automated exposure control, adjustment of the mA and/or kV according to patient size and/or use of iterative reconstruction technique. CONTRAST:  58m OMNIPAQUE IOHEXOL 300 MG/ML  SOLN COMPARISON:  CT of 11/23/2021 and PET of 08/17/2021. FINDINGS: CT CHEST FINDINGS Cardiovascular: Aortic atherosclerosis. Tortuous thoracic aorta. Normal heart size, without pericardial effusion. Three vessel coronary artery calcification. No central pulmonary embolism, on this non-dedicated study. Mediastinum/Nodes: No supraclavicular adenopathy. No mediastinal or hilar adenopathy. Contrast level in the esophagus. Lungs/Pleura: Resolved pleural effusions. Chronic collapse of a portion of  the posterior left upper lobe with radiation fiducials within. No local recurrence. Innumerable bilateral pulmonary nodules. Index 6 mm left lower lobe sub solid nodule on 64/3 measured 5 mm on the prior exam (when remeasured). Just caudal to this, a sub solid nodule measures 5 mm on 61/3 and is similar to on the prior exam (when remeasured). Left lower lobe cavitary lesion measures 7 mm on 115/3 versus 5 mm on the prior (when remeasured). Two adjacent right lower lobe pulmonary nodules measure maximally 5 mm on 108/3 versus 4 mm on the prior (when remeasured). Musculoskeletal: Multifocal sclerotic osseous metastasis are increased as detailed below. CT ABDOMEN PELVIS FINDINGS Hepatobiliary: Normal liver. Tiny dependent gallstone suspected. No acute cholecystitis or biliary duct dilatation. Pancreas: Normal, without mass or ductal dilatation. Spleen: Normal in size, without focal abnormality. Adrenals/Urinary Tract: Normal adrenal glands. Normal kidneys, without hydronephrosis. Normal urinary bladder. Stomach/Bowel: Normal stomach, without wall thickening. Normal colon, appendix, and terminal ileum. Normal small bowel. Vascular/Lymphatic: Aortic atherosclerosis. No abdominopelvic adenopathy. Reproductive: Mild prostatomegaly. Other: No significant free fluid. No evidence of omental or peritoneal disease. Musculoskeletal: Left iliac sclerotic lesion of 7 mm on 96/2 is enlarged from 3-4 mm on the prior. Increased sclerosis involving the posterior right iliac. Eccentric left L2 vertebral body lesion with increased superior endplate irregularity including on 86/6. An anterior L3 sclerotic lesion of 7 mm on sagittal image 75 is increased from 5 mm on the prior exam (when remeasured). S shaped thoracolumbar spine curvature. IMPRESSION: CT CHEST IMPRESSION 1. Mild progression of diffuse pulmonary nodules indicative of metastasis. 2. Chronic left upper lobe therapy induced partial collapse, without local recurrence. 3. No  thoracic adenopathy. 4. Resolved bilateral pleural effusions. 5. Esophageal air fluid level suggests dysmotility or gastroesophageal reflux. 6. Coronary artery atherosclerosis. Aortic Atherosclerosis (ICD10-I70.0). CT ABDOMEN AND PELVIS IMPRESSION 1. No soft tissue metastasis within the abdomen or pelvis. 2. Progressive osseous metastasis. 3. Probable cholelithiasis. Electronically Signed   By: KAbigail MiyamotoM.D.   On: 02/27/2022 13:44   DG Foot Complete Right  Result Date: 02/09/2022 CLINICAL DATA:  Right foot pain. No reported injury. History of arthritis. EXAM: RIGHT FOOT COMPLETE - 3+ VIEW COMPARISON:  None FINDINGS: Normal bone mineralization. Minimal degenerative spurring at the lateral aspect of the great toe metatarsophalangeal joint. Mild-to-moderate distal dorsal talar degenerative spurring. Well-circumscribed lucent lesion with small central calcific versus ossific density within the anterior process of the calcaneus measuring up to approximately 2.2 cm in AP dimension with benign narrow zone of transition. No acute fracture or dislocation. IMPRESSION: Minimal great toe metatarsophalangeal joint osteoarthritis. Benign lucent lesion with narrow zone of transition within  the anterior process of the calcaneus. This is favored to represent an intraosseous lipoma especially given the central calcification that may represent sclerosis. Unicameral bone cyst can have a similar lucent appearance with narrow zone of transition. Electronically Signed   By: Yvonne Kendall M.D.   On: 02/09/2022 10:20     ONCOLOGY HISTORY:  Biopsy of lung nodule on November 27, 2016 revealed adenocarcinoma, but there was no invasive component on the sample. Biopsy of mediastinal lymph node had insufficient material. Patient ultimately declined any further biopsies or surgery and elected to proceed with XRT alone which he completed in April 2018.  PET scan on January 22, 2019 reviewed independently with increased metabolic activity  prior to previous with SUV increasing from 6.5 up to 8.8.  Patient declined intervention at that time and elected to proceed with simple observation.  Repeat CT scan on April 21, 2019 reviewed independently with progressive 6.4 cm mass in the left upper lobe.  Patient declined systemic treatment, but agreed to proceed with XRT which she completed in approximately August 2020.  CT scan on September 23, 2019 reviewed independently with no obvious evidence of recurrent or progressive disease.   ASSESSMENT: Progressive adenocarcinoma of upper lobe of left lung   PLAN:   1.  Progressive adenocarcinoma of upper lobe of left lung: PET scan results from August 17, 2021 reviewed independently with interval continued progression of bony metastatic disease and a new hypermetabolic bone lesion in the right sacrum.  Bilateral pulmonary nodules were stable, but concerning for metastatic involvement.  At that time we discussed systemic treatment, but patient and his wife elected for continued active surveillance.  Previously he stated he would not want aggressive chemotherapy but would consider immunotherapy if needed despite PD-L1 being unknown.  Repeat CT scan on Feb 27, 2022 reviewed independently and reported as above with only minimal progression of disease after only 4 infusions of Keytruda.  Continue treatment as planned, but patient does not wish to have treatment today therefore delay cycle five 1 week.  Return to clinic in 1 week for further evaluation and reconsideration of cycle 5.  Patient also received Zometa on odd-numbered cycles. 2.  Shoulder/back pain: Patient does not complain of this today.  Previously, patient reported this has been evident for years/decades.  Continue tramadol as needed.   3.  Shortness of breath/CHF: Patient does not complain of this today.  Follow-up with cardiology as needed. 4.  Bony disease: Patient receives Zometa on odd-numbered cycles as above. 5.  Leukocytosis: Chronic and  unchanged. 6.  Hyponatremia: Chronic and unchanged.   7.  Hypokalemia: Chronic and unchanged.  Continue oral supplementation. 8.  Foot pain/neuropathy: X-ray from February 09, 2022 revealed joint osteoarthritis.  Patient was also given a referral to Occupational Therapy for his "unsteadiness".   Patient expressed understanding and was in agreement with this plan. He also understands that He can call clinic at any time with any questions, concerns, or complaints.     [name], MD   03/04/2022 6:57 AM

## 2022-03-05 DIAGNOSIS — I2510 Atherosclerotic heart disease of native coronary artery without angina pectoris: Secondary | ICD-10-CM | POA: Diagnosis not present

## 2022-03-05 DIAGNOSIS — I739 Peripheral vascular disease, unspecified: Secondary | ICD-10-CM | POA: Diagnosis not present

## 2022-03-05 DIAGNOSIS — I10 Essential (primary) hypertension: Secondary | ICD-10-CM | POA: Diagnosis not present

## 2022-03-05 DIAGNOSIS — E119 Type 2 diabetes mellitus without complications: Secondary | ICD-10-CM | POA: Diagnosis not present

## 2022-03-05 DIAGNOSIS — R011 Cardiac murmur, unspecified: Secondary | ICD-10-CM | POA: Diagnosis not present

## 2022-03-05 DIAGNOSIS — C3412 Malignant neoplasm of upper lobe, left bronchus or lung: Secondary | ICD-10-CM | POA: Diagnosis not present

## 2022-03-05 DIAGNOSIS — I480 Paroxysmal atrial fibrillation: Secondary | ICD-10-CM | POA: Diagnosis not present

## 2022-03-05 DIAGNOSIS — R0602 Shortness of breath: Secondary | ICD-10-CM | POA: Diagnosis not present

## 2022-03-06 ENCOUNTER — Other Ambulatory Visit: Payer: Self-pay | Admitting: *Deleted

## 2022-03-06 MED ORDER — MEGESTROL ACETATE 40 MG PO TABS
40.0000 mg | ORAL_TABLET | Freq: Every day | ORAL | 3 refills | Status: DC
Start: 2022-03-06 — End: 2022-03-26

## 2022-03-06 MED ORDER — POTASSIUM CHLORIDE CRYS ER 20 MEQ PO TBCR: 20.0000 meq | EXTENDED_RELEASE_TABLET | Freq: Every day | ORAL | 3 refills | Status: AC

## 2022-03-07 ENCOUNTER — Inpatient Hospital Stay: Payer: Medicare HMO | Admitting: Occupational Therapy

## 2022-03-08 ENCOUNTER — Inpatient Hospital Stay (HOSPITAL_BASED_OUTPATIENT_CLINIC_OR_DEPARTMENT_OTHER): Payer: Medicare HMO | Admitting: Oncology

## 2022-03-08 ENCOUNTER — Inpatient Hospital Stay: Payer: Medicare HMO

## 2022-03-08 ENCOUNTER — Encounter: Payer: Self-pay | Admitting: Oncology

## 2022-03-08 VITALS — BP 119/67 | HR 72 | Temp 95.8°F | Resp 16 | Ht 71.0 in | Wt 128.0 lb

## 2022-03-08 DIAGNOSIS — C7951 Secondary malignant neoplasm of bone: Secondary | ICD-10-CM | POA: Diagnosis not present

## 2022-03-08 DIAGNOSIS — C3412 Malignant neoplasm of upper lobe, left bronchus or lung: Secondary | ICD-10-CM | POA: Diagnosis not present

## 2022-03-08 DIAGNOSIS — E871 Hypo-osmolality and hyponatremia: Secondary | ICD-10-CM | POA: Diagnosis not present

## 2022-03-08 DIAGNOSIS — Z5112 Encounter for antineoplastic immunotherapy: Secondary | ICD-10-CM | POA: Diagnosis not present

## 2022-03-08 DIAGNOSIS — E876 Hypokalemia: Secondary | ICD-10-CM | POA: Diagnosis not present

## 2022-03-08 DIAGNOSIS — D72829 Elevated white blood cell count, unspecified: Secondary | ICD-10-CM | POA: Diagnosis not present

## 2022-03-08 DIAGNOSIS — Z79899 Other long term (current) drug therapy: Secondary | ICD-10-CM | POA: Diagnosis not present

## 2022-03-08 LAB — COMPREHENSIVE METABOLIC PANEL
ALT: 14 U/L (ref 0–44)
AST: 28 U/L (ref 15–41)
Albumin: 3.6 g/dL (ref 3.5–5.0)
Alkaline Phosphatase: 341 U/L — ABNORMAL HIGH (ref 38–126)
Anion gap: 8 (ref 5–15)
BUN: 14 mg/dL (ref 8–23)
CO2: 27 mmol/L (ref 22–32)
Calcium: 8.2 mg/dL — ABNORMAL LOW (ref 8.9–10.3)
Chloride: 99 mmol/L (ref 98–111)
Creatinine, Ser: 1.06 mg/dL (ref 0.61–1.24)
GFR, Estimated: >60 mL/min (ref >60)
Glucose, Bld: 197 mg/dL — ABNORMAL HIGH (ref 70–99)
Potassium: 3.9 mmol/L (ref 3.5–5.1)
Sodium: 134 mmol/L — ABNORMAL LOW (ref 135–145)
Total Bilirubin: 0.8 mg/dL (ref 0.3–1.2)
Total Protein: 6.8 g/dL (ref 6.5–8.1)

## 2022-03-08 LAB — CBC WITH DIFFERENTIAL/PLATELET
Abs Immature Granulocytes: 0.1 10*3/uL — ABNORMAL HIGH (ref 0.00–0.07)
Basophils Absolute: 0.1 10*3/uL (ref 0.0–0.1)
Basophils Relative: 1 %
Eosinophils Absolute: 0.6 10*3/uL — ABNORMAL HIGH (ref 0.0–0.5)
Eosinophils Relative: 5 %
HCT: 40.9 % (ref 39.0–52.0)
Hemoglobin: 13.5 g/dL (ref 13.0–17.0)
Immature Granulocytes: 1 %
Lymphocytes Relative: 11 %
Lymphs Abs: 1.3 10*3/uL (ref 0.7–4.0)
MCH: 27.7 pg (ref 26.0–34.0)
MCHC: 33 g/dL (ref 30.0–36.0)
MCV: 83.8 fL (ref 80.0–100.0)
Monocytes Absolute: 1 10*3/uL (ref 0.1–1.0)
Monocytes Relative: 8 %
Neutro Abs: 9.3 10*3/uL — ABNORMAL HIGH (ref 1.7–7.7)
Neutrophils Relative %: 74 %
Platelets: 211 10*3/uL (ref 150–400)
RBC: 4.88 MIL/uL (ref 4.22–5.81)
RDW: 14.6 % (ref 11.5–15.5)
WBC: 12.3 10*3/uL — ABNORMAL HIGH (ref 4.0–10.5)
nRBC: 0 % (ref 0.0–0.2)

## 2022-03-08 LAB — TSH: TSH: 2.874 u[IU]/mL (ref 0.350–4.500)

## 2022-03-08 MED ORDER — SODIUM CHLORIDE 0.9 % IV SOLN
Freq: Once | INTRAVENOUS | Status: AC
  Filled 2022-03-08: qty 250

## 2022-03-08 MED ORDER — ZOLEDRONIC ACID 4 MG/5ML IV CONC
3.0000 mg | Freq: Once | INTRAVENOUS | Status: AC
  Administered 2022-03-08: 3 mg via INTRAVENOUS
  Filled 2022-03-08: qty 3.75

## 2022-03-08 MED ORDER — SODIUM CHLORIDE 0.9 % IV SOLN
200.0000 mg | Freq: Once | INTRAVENOUS | Status: AC
  Administered 2022-03-08: 200 mg via INTRAVENOUS
  Filled 2022-03-08: qty 8

## 2022-03-08 NOTE — Patient Instructions (Signed)
[facility] AT [place]  Discharge Instructions: Thank you for choosing [facility] to provide your oncology and hematology care.  If you have a lab appointment with the [facility], please go directly to the [facility] and check in at the registration area.  Wear comfortable clothing and clothing appropriate for easy access to any Portacath or PICC line.   We strive to give you quality time with your provider. You may need to reschedule your appointment if you arrive late (15 or more minutes).  Arriving late affects you and other patients whose appointments are after yours.  Also, if you miss three or more appointments without notifying the office, you may be dismissed from the clinic at the provider's discretion.      For prescription refill requests, have your pharmacy contact our office and allow 72 hours for refills to be completed.    Today you received the following chemotherapy and/or immunotherapy agents Beryle Flock   To help prevent nausea and vomiting after your treatment, we encourage you to take your nausea medication as directed.  BELOW ARE SYMPTOMS THAT SHOULD BE REPORTED IMMEDIATELY: *FEVER GREATER THAN 100.4 F (38 C) OR HIGHER *CHILLS OR SWEATING *NAUSEA AND VOMITING THAT IS NOT CONTROLLED WITH YOUR NAUSEA MEDICATION *UNUSUAL SHORTNESS OF BREATH *UNUSUAL BRUISING OR BLEEDING *URINARY PROBLEMS (pain or burning when urinating, or frequent urination) *BOWEL PROBLEMS (unusual diarrhea, constipation, pain near the anus) TENDERNESS IN MOUTH AND THROAT WITH OR WITHOUT PRESENCE OF ULCERS (sore throat, sores in mouth, or a toothache) UNUSUAL RASH, SWELLING OR PAIN  UNUSUAL VAGINAL DISCHARGE OR ITCHING   Items with * indicate a potential emergency and should be followed up as soon as possible or go to the Emergency Department if any problems should occur.  Please show the CHEMOTHERAPY ALERT CARD or IMMUNOTHERAPY ALERT CARD at check-in to the  Emergency Department and triage nurse.  Should you have questions after your visit or need to cancel or reschedule your appointment, please contact [facility] [place] AT [place]  [phone] and follow the prompts.  Office hours are 8:00 a.m. to 4:30 p.m. Monday - Friday. Please note that voicemails left after 4:00 p.m. may not be returned until the following business day.  We are closed weekends and major holidays. You have access to a nurse at all times for urgent questions. Please call the main number to the clinic [phone] and follow the prompts.  For any non-urgent questions, you may also contact your provider using MyChart. We now offer e-Visits for anyone 56 and older to request care online for non-urgent symptoms. For details visit [email or web address].   Also download the MyChart app! Go to the app store, search "MyChart", open the app, select [facility], and log in with your MyChart username and password.  Due to Covid, a mask is required upon entering the hospital/clinic. If you do not have a mask, one will be given to you upon arrival. For doctor visits, patients may have 1 support person aged 57 or older with them. For treatment visits, patients cannot have anyone with them due to current Covid guidelines and our immunocompromised population.

## 2022-03-08 NOTE — Progress Notes (Signed)
Per Dr Grayland Ormond to proceed with zometa today with Ca 8.2. Treatment team updated.   Janiesha Diehl [employer]

## 2022-03-09 LAB — T4: T4, Total: 9.9 ug/dL (ref 4.5–12.0)

## 2022-03-23 ENCOUNTER — Other Ambulatory Visit: Payer: Self-pay

## 2022-03-23 ENCOUNTER — Emergency Department: Payer: Medicare HMO

## 2022-03-23 ENCOUNTER — Inpatient Hospital Stay
Admission: EM | Admit: 2022-03-23 | Discharge: 2022-03-26 | DRG: 175 | Disposition: A | Discharge status: Home Health | Payer: Medicare HMO | Source: Ambulatory Visit | Attending: Internal Medicine | Admitting: Internal Medicine

## 2022-03-23 ENCOUNTER — Encounter: Payer: Self-pay | Admitting: Internal Medicine

## 2022-03-23 DIAGNOSIS — I252 Old myocardial infarction: Secondary | ICD-10-CM | POA: Diagnosis not present

## 2022-03-23 DIAGNOSIS — I480 Paroxysmal atrial fibrillation: Secondary | ICD-10-CM | POA: Diagnosis not present

## 2022-03-23 DIAGNOSIS — E119 Type 2 diabetes mellitus without complications: Secondary | ICD-10-CM | POA: Diagnosis not present

## 2022-03-23 DIAGNOSIS — Z7901 Long term (current) use of anticoagulants: Secondary | ICD-10-CM

## 2022-03-23 DIAGNOSIS — Z87891 Personal history of nicotine dependence: Secondary | ICD-10-CM | POA: Diagnosis not present

## 2022-03-23 DIAGNOSIS — Z8674 Personal history of sudden cardiac arrest: Secondary | ICD-10-CM

## 2022-03-23 DIAGNOSIS — M1990 Unspecified osteoarthritis, unspecified site: Secondary | ICD-10-CM | POA: Diagnosis present

## 2022-03-23 DIAGNOSIS — C3412 Malignant neoplasm of upper lobe, left bronchus or lung: Secondary | ICD-10-CM | POA: Diagnosis present

## 2022-03-23 DIAGNOSIS — Z1152 Encounter for screening for COVID-19: Secondary | ICD-10-CM

## 2022-03-23 DIAGNOSIS — Z681 Body mass index (BMI) 19 or less, adult: Secondary | ICD-10-CM

## 2022-03-23 DIAGNOSIS — D72829 Elevated white blood cell count, unspecified: Secondary | ICD-10-CM | POA: Diagnosis not present

## 2022-03-23 DIAGNOSIS — R64 Cachexia: Secondary | ICD-10-CM | POA: Diagnosis not present

## 2022-03-23 DIAGNOSIS — Z923 Personal history of irradiation: Secondary | ICD-10-CM

## 2022-03-23 DIAGNOSIS — Z79899 Other long term (current) drug therapy: Secondary | ICD-10-CM

## 2022-03-23 DIAGNOSIS — E43 Unspecified severe protein-calorie malnutrition: Secondary | ICD-10-CM | POA: Diagnosis not present

## 2022-03-23 DIAGNOSIS — Z888 Allergy status to other drugs, medicaments and biological substances status: Secondary | ICD-10-CM | POA: Diagnosis not present

## 2022-03-23 DIAGNOSIS — I10 Essential (primary) hypertension: Secondary | ICD-10-CM | POA: Diagnosis present

## 2022-03-23 DIAGNOSIS — I509 Heart failure, unspecified: Secondary | ICD-10-CM | POA: Diagnosis not present

## 2022-03-23 DIAGNOSIS — I5042 Chronic combined systolic (congestive) and diastolic (congestive) heart failure: Secondary | ICD-10-CM | POA: Diagnosis not present

## 2022-03-23 DIAGNOSIS — Z8249 Family history of ischemic heart disease and other diseases of the circulatory system: Secondary | ICD-10-CM | POA: Diagnosis not present

## 2022-03-23 DIAGNOSIS — R079 Chest pain, unspecified: Secondary | ICD-10-CM | POA: Diagnosis not present

## 2022-03-23 DIAGNOSIS — Z955 Presence of coronary angioplasty implant and graft: Secondary | ICD-10-CM | POA: Diagnosis not present

## 2022-03-23 DIAGNOSIS — I2693 Single subsegmental pulmonary embolism without acute cor pulmonale: Secondary | ICD-10-CM

## 2022-03-23 DIAGNOSIS — R918 Other nonspecific abnormal finding of lung field: Secondary | ICD-10-CM | POA: Diagnosis not present

## 2022-03-23 DIAGNOSIS — R911 Solitary pulmonary nodule: Secondary | ICD-10-CM | POA: Diagnosis not present

## 2022-03-23 DIAGNOSIS — R600 Localized edema: Secondary | ICD-10-CM | POA: Diagnosis not present

## 2022-03-23 DIAGNOSIS — I5022 Chronic systolic (congestive) heart failure: Secondary | ICD-10-CM | POA: Diagnosis not present

## 2022-03-23 DIAGNOSIS — Z7902 Long term (current) use of antithrombotics/antiplatelets: Secondary | ICD-10-CM

## 2022-03-23 DIAGNOSIS — I2699 Other pulmonary embolism without acute cor pulmonale: Secondary | ICD-10-CM | POA: Diagnosis not present

## 2022-03-23 DIAGNOSIS — I2609 Other pulmonary embolism with acute cor pulmonale: Secondary | ICD-10-CM | POA: Diagnosis not present

## 2022-03-23 DIAGNOSIS — I110 Hypertensive heart disease with heart failure: Secondary | ICD-10-CM | POA: Diagnosis present

## 2022-03-23 DIAGNOSIS — I2510 Atherosclerotic heart disease of native coronary artery without angina pectoris: Secondary | ICD-10-CM | POA: Diagnosis present

## 2022-03-23 DIAGNOSIS — J439 Emphysema, unspecified: Secondary | ICD-10-CM | POA: Diagnosis present

## 2022-03-23 DIAGNOSIS — I513 Intracardiac thrombosis, not elsewhere classified: Secondary | ICD-10-CM | POA: Diagnosis present

## 2022-03-23 DIAGNOSIS — E785 Hyperlipidemia, unspecified: Secondary | ICD-10-CM | POA: Diagnosis not present

## 2022-03-23 DIAGNOSIS — R0789 Other chest pain: Secondary | ICD-10-CM | POA: Diagnosis not present

## 2022-03-23 LAB — CBC
HCT: 47.4 % (ref 39.0–52.0)
Hemoglobin: 15.6 g/dL (ref 13.0–17.0)
MCH: 27.3 pg (ref 26.0–34.0)
MCHC: 32.9 g/dL (ref 30.0–36.0)
MCV: 83 fL (ref 80.0–100.0)
Platelets: 235 10*3/uL (ref 150–400)
RBC: 5.71 MIL/uL (ref 4.22–5.81)
RDW: 15.1 % (ref 11.5–15.5)
WBC: 12.3 10*3/uL — ABNORMAL HIGH (ref 4.0–10.5)
nRBC: 0 % (ref 0.0–0.2)

## 2022-03-23 LAB — COMPREHENSIVE METABOLIC PANEL
ALT: 28 U/L (ref 0–44)
AST: 48 U/L — ABNORMAL HIGH (ref 15–41)
Albumin: 4 g/dL (ref 3.5–5.0)
Alkaline Phosphatase: 403 U/L — ABNORMAL HIGH (ref 38–126)
Anion gap: 9 (ref 5–15)
BUN: 13 mg/dL (ref 8–23)
CO2: 25 mmol/L (ref 22–32)
Calcium: 8.4 mg/dL — ABNORMAL LOW (ref 8.9–10.3)
Chloride: 99 mmol/L (ref 98–111)
Creatinine, Ser: 0.79 mg/dL (ref 0.61–1.24)
GFR, Estimated: >60 mL/min (ref >60)
Glucose, Bld: 134 mg/dL — ABNORMAL HIGH (ref 70–99)
Potassium: 4.7 mmol/L (ref 3.5–5.1)
Sodium: 133 mmol/L — ABNORMAL LOW (ref 135–145)
Total Bilirubin: 1 mg/dL (ref 0.3–1.2)
Total Protein: 7.9 g/dL (ref 6.5–8.1)

## 2022-03-23 LAB — TROPONIN I (HIGH SENSITIVITY)
Troponin I (High Sensitivity): 26 ng/L — ABNORMAL HIGH (ref <18)
Troponin I (High Sensitivity): 28 ng/L — ABNORMAL HIGH (ref <18)

## 2022-03-23 LAB — BRAIN NATRIURETIC PEPTIDE: B Natriuretic Peptide: 480.9 pg/mL — ABNORMAL HIGH (ref 0.0–100.0)

## 2022-03-23 LAB — GLUCOSE, CAPILLARY
Glucose-Capillary: 170 mg/dL — ABNORMAL HIGH (ref 70–99)
Glucose-Capillary: 102 mg/dL — ABNORMAL HIGH (ref 70–99)

## 2022-03-23 LAB — HEPARIN LEVEL (UNFRACTIONATED): Heparin Unfractionated: 0.78 IU/mL — ABNORMAL HIGH (ref 0.30–0.70)

## 2022-03-23 LAB — PROTIME-INR
INR: 1.2 (ref 0.8–1.2)
Prothrombin Time: 14.8 seconds (ref 11.4–15.2)

## 2022-03-23 LAB — APTT
aPTT: 27 seconds (ref 24–36)
aPTT: 125 seconds — ABNORMAL HIGH (ref 24–36)

## 2022-03-23 MED ORDER — MEGESTROL ACETATE 20 MG PO TABS
40.0000 mg | ORAL_TABLET | Freq: Every day | ORAL | Status: DC
Start: 2022-03-24 — End: 2022-03-23

## 2022-03-23 MED ORDER — ACETAMINOPHEN 325 MG PO TABS: 650.0000 mg | ORAL_TABLET | Freq: Four times a day (QID) | ORAL | Status: DC | PRN

## 2022-03-23 MED ORDER — POLYETHYLENE GLYCOL 3350 17 G PO PACK: 17.0000 g | PACK | Freq: Every day | ORAL | Status: DC | PRN

## 2022-03-23 MED ORDER — HYDROCHLOROTHIAZIDE 12.5 MG PO TABS
12.5000 mg | ORAL_TABLET | Freq: Every day | ORAL | Status: DC
  Administered 2022-03-24 – 2022-03-26 (×3): 12.5 mg via ORAL
  Filled 2022-03-23 (×3): qty 1

## 2022-03-23 MED ORDER — LOSARTAN POTASSIUM-HCTZ 50-12.5 MG PO TABS: 1.0000 | ORAL_TABLET | Freq: Every day | ORAL | Status: DC

## 2022-03-23 MED ORDER — IOHEXOL 350 MG/ML SOLN
75.0000 mL | Freq: Once | INTRAVENOUS | Status: AC | PRN
Start: 2022-03-23 — End: 2022-03-23
  Administered 2022-03-23: 75 mL via INTRAVENOUS

## 2022-03-23 MED ORDER — INSULIN ASPART 100 UNIT/ML IJ SOLN
0.0000 [IU] | Freq: Three times a day (TID) | INTRAMUSCULAR | Status: DC
  Administered 2022-03-23: 2 [IU] via SUBCUTANEOUS
  Administered 2022-03-24: 1 [IU] via SUBCUTANEOUS
  Filled 2022-03-23 (×2): qty 1

## 2022-03-23 MED ORDER — ROSUVASTATIN CALCIUM 5 MG PO TABS
2.5000 mg | ORAL_TABLET | Freq: Every day | ORAL | Status: DC
  Administered 2022-03-24 – 2022-03-26 (×3): 2.5 mg via ORAL
  Filled 2022-03-23 (×3): qty 1

## 2022-03-23 MED ORDER — ACETAMINOPHEN 650 MG RE SUPP: 650.0000 mg | Freq: Four times a day (QID) | RECTAL | Status: DC | PRN

## 2022-03-23 MED ORDER — SODIUM CHLORIDE 0.9 % IV BOLUS
1000.0000 mL | Freq: Once | INTRAVENOUS | Status: AC
  Administered 2022-03-23: 1000 mL via INTRAVENOUS

## 2022-03-23 MED ORDER — ONDANSETRON HCL 4 MG/2ML IJ SOLN
4.0000 mg | Freq: Once | INTRAMUSCULAR | Status: AC
  Administered 2022-03-23: 4 mg via INTRAVENOUS
  Filled 2022-03-23: qty 2

## 2022-03-23 MED ORDER — LOSARTAN POTASSIUM 50 MG PO TABS
50.0000 mg | ORAL_TABLET | Freq: Every day | ORAL | Status: DC
  Administered 2022-03-24 – 2022-03-26 (×3): 50 mg via ORAL
  Filled 2022-03-23 (×3): qty 1

## 2022-03-23 MED ORDER — SODIUM CHLORIDE 0.9% FLUSH
3.0000 mL | Freq: Two times a day (BID) | INTRAVENOUS | Status: DC
Start: 2022-03-23 — End: 2022-03-26
  Administered 2022-03-23 – 2022-03-26 (×3): 3 mL via INTRAVENOUS

## 2022-03-23 MED ORDER — CLOPIDOGREL BISULFATE 75 MG PO TABS: 75.0000 mg | ORAL_TABLET | Freq: Every day | ORAL | Status: DC

## 2022-03-23 MED ORDER — HEPARIN (PORCINE) 25000 UT/250ML-% IV SOLN
900.0000 [IU]/h | INTRAVENOUS | Status: DC
  Administered 2022-03-23: 1000 [IU]/h via INTRAVENOUS
  Administered 2022-03-24 – 2022-03-25 (×2): 900 [IU]/h via INTRAVENOUS
  Filled 2022-03-23 (×4): qty 250

## 2022-03-23 MED ORDER — HEPARIN BOLUS VIA INFUSION
4000.0000 [IU] | Freq: Once | INTRAVENOUS | Status: AC
  Administered 2022-03-23: 4000 [IU] via INTRAVENOUS
  Filled 2022-03-23: qty 4000

## 2022-03-23 MED ORDER — AMIODARONE HCL 200 MG PO TABS
100.0000 mg | ORAL_TABLET | Freq: Every day | ORAL | Status: DC
  Administered 2022-03-24 – 2022-03-26 (×3): 100 mg via ORAL
  Filled 2022-03-23 (×3): qty 1

## 2022-03-23 NOTE — ED Notes (Signed)
See triage note, pt reports left sided chest pain that woke him up this am at 0400. +nausea. Denies shob. Denies chest pain at this time. Hx MI x2. NAD noted.  Stretcher locked in lowest position call bell in reach.

## 2022-03-23 NOTE — ED Triage Notes (Signed)
Pt here with CP at 0400 this morning. Pt states pain is left sided. Pt has hx of 2 MI. Pt is on immunosuppressive therapy currently. Pt also having some nausea.

## 2022-03-23 NOTE — ED Notes (Signed)
Pt transferred by RN with monitor to unit.

## 2022-03-23 NOTE — Progress Notes (Signed)
ANTICOAGULATION CONSULT NOTE - Initial Consult  Pharmacy Consult for Heparin Drip Indication: pulmonary embolus  Allergies  Allergen Reactions   Statins Other (See Comments)    Arthralagia    Patient Measurements: Height: 5\' 11"  (180.3 cm) Weight: 58.1 kg (128 lb 1.4 oz) IBW/kg (Calculated) : 75.3 Heparin Dosing Weight: 58.1 kg  Vital Signs: Temp: 98.5 F (36.9 C) (06/09 1951) Temp Source: Oral (06/09 1739) BP: 148/76 (06/09 1951) Pulse Rate: 77 (06/09 1951)  Labs: Recent Labs    03/23/22 1005 03/23/22 1217 03/23/22 1330 03/23/22 2205  HGB 15.6  --   --   --   HCT 47.4  --   --   --   PLT 235  --   --   --   APTT  --   --  27 125*  LABPROT  --   --  14.8  --   INR  --   --  1.2  --   HEPARINUNFRC  --   --  0.78*  --   CREATININE 0.79  --   --   --   TROPONINIHS 26* 28*  --   --      Estimated Creatinine Clearance: 55.5 mL/min (by C-G formula based on SCr of 0.79 mg/dL).   Medical History: Past Medical History:  Diagnosis Date   Arthritis    CAD (coronary artery disease) Nov. 12, 2012   Inferior ST elevation MI in November of 2012 with ventricular fibrillation arrest. Status post drug-eluting stent placement to the mid LAD. Most recent cardiac catheterization in June of this year showed patent stent with minimal restenosis, 75% proximal LAD and 80% distal LAD which were unchanged from before. Mild disease in the left circumflex and moderate RCA disease. Ejection fraction was 55%.   Cancer of upper lobe of left lung ([place]) 11/2016   Rad tx's.   Cardiac arrest [facility]) Nov. 2012   x 2    Cardiac arrest - ventricular fibrillation    HOH (hard of hearing)    Left Hearing Aid   Hyperlipidemia    Hypertension    Post PTCA 2013   x2   ST elevation MI (STEMI) ([place]) 08/26/2011    Medications:  Apixaban 5mg  bid prior to admission, last dose on 03/22/22 in the AM.  Assessment: Patient is a 88yo male presented with chest pain and found to have a small PE. Pharmacy  is consulted for Heparin dosing.  Baseline Labs: HL 0.78, others within range  Goal of Therapy:  Heparin level 0.3-0.7 units/ml aPTT 66-102 seconds Monitor platelets by anticoagulation protocol: Yes   Plan:  6/9: APTT @ 2205 = 125, elevated Will decrease heparin drip to 900 units/hr and recheck aPTT and HL 8 hrs after rate change.   Kylah Maresh D 03/23/2022 11:08 PM

## 2022-03-23 NOTE — H&P (Signed)
History and Physical   Scott Gross VHQ:[id number] DOB: December 08, 1933 DOA: 03/23/2022  PCP: [name], MD   Patient coming from: Home  Chief Complaint: Chest Pain  HPI: Scott Gross is a 88 y.o. male with medical history significant of CAD status post tenting, hyperlipidemia, hypertension, left upper lobe cancer, diabetes, atrial fibrillation, cardiac arrest, CHF presenting with chest pain.  Patient presenting with chest pain starting this morning around 4 AM.  He reports he had associated nausea and diaphoresis and it felt somewhat similar to his previous MI 3 to 4 years ago however pain was much less severe this time around..  At time evaluation chest pain has fully resolved.  Patient and family note that he was started on Megace and has been taking this for few weeks.  Also stopped his Plavix a couple weeks ago.  Otherwise the only new "medication "he has tried is with his neuropathy a member of his church gave him a CBD pill to try.  He denies fevers, chills, shortness of breath, abdominal pain, constipation, diarrhea, vomiting.  ED Course: Vital signs in the ED significant for blood pressure in the 413K systolic.  Lab work-up included CMP with sodium 133, glucose 134, calcium 8.4, AST 48, ALP stable at 403.  CBC with mild leukocytosis to 12.5.  BMP elevated at 480.  Troponin elevated to 26 with repeat pending.  Imaging studies included chest x-ray which showed stable emphysematous changes, bilateral pulmonary nodules and chronic collapse of the medial left upper lobe.  CTA of the lung showed pulmonary embolism in the right lower lobe with evidence of right heart strain with RV to LV ratio of 1.13.  EDP contacted radiologist who states that this strain was noted on previous imaging and may be due to pulmonary hypertension chronic collapse.  There were some crest then on the imaging of chronicity of this embolus as well due to pulmonary artery collapse however does not appear it was noted  on prior imaging 70 be acute.  Certainly acute and not noted on prior imaging with thrombus in the left atrial appendage.  Patient started on heparin drip in the ED.  Also received Zofran and a liter of fluids.  Review of Systems: As per HPI otherwise all other systems reviewed and are negative.  Past Medical History:  Diagnosis Date   Arthritis    CAD (coronary artery disease) Nov. 12, 2012   Inferior ST elevation MI in November of 2012 with ventricular fibrillation arrest. Status post drug-eluting stent placement to the mid LAD. Most recent cardiac catheterization in June of this year showed patent stent with minimal restenosis, 75% proximal LAD and 80% distal LAD which were unchanged from before. Mild disease in the left circumflex and moderate RCA disease. Ejection fraction was 55%.   Cancer of upper lobe of left lung ([place]) 11/2016   Rad tx's.   Cardiac arrest [facility] Nov. 2012   x 2    Cardiac arrest - ventricular fibrillation    HOH (hard of hearing)    Left Hearing Aid   Hyperlipidemia    Hypertension    Post PTCA 2013   x2   ST elevation MI (STEMI) ([place]) 08/26/2011    Past Surgical History:  Procedure Laterality Date   CARDIAC CATHETERIZATION  2013   @ [place]; [facility] s/p stent    CORONARY ANGIOPLASTY     CORONARY/GRAFT ACUTE MI REVASCULARIZATION N/A 06/06/2021   Procedure: Coronary/Graft Acute MI Revascularization;  Surgeon: Wellington Hampshire, MD;  Location: [place] [facility];  Service: Cardiovascular;  Laterality: N/A;   ELECTROMAGNETIC NAVIGATION BROCHOSCOPY N/A 11/27/2016   Procedure: ELECTROMAGNETIC NAVIGATION BRONCHOSCOPY;  Surgeon: [name], MD;  Location: [facility] ORS;  Service: Cardiopulmonary;  Laterality: N/A;   LEFT HEART CATH AND CORONARY ANGIOGRAPHY N/A 06/06/2021   Procedure: LEFT HEART CATH AND CORONARY ANGIOGRAPHY;  Surgeon: Wellington Hampshire, MD;  Location: [place] [facility];  Service: Cardiovascular;  Laterality: N/A;    Social History  reports that he  quit smoking about 10 years ago. His smoking use included cigarettes. He smoked an average of 1 pack per day. He has never used smokeless tobacco. He reports that he does not drink alcohol and does not use drugs.  Allergies  Allergen Reactions   Statins Other (See Comments)    Arthralagia    Family History  Problem Relation Age of Onset   Heart attack Mother    Heart attack Father   Reviewed on admission  Prior to Admission medications   Medication Sig Start Date End Date Taking? Authorizing Provider  albuterol (PROVENTIL) (2.5 MG/3ML) 0.083% nebulizer solution SMARTSIG:3 Milliliter(s) Via Nebulizer Every 6 Hours PRN 12/19/21   [provider]  albuterol (VENTOLIN HFA) 108 (90 Base) MCG/ACT inhaler Inhale into the lungs every 6 (six) hours as needed. 01/24/21   [provider]  amiodarone (PACERONE) 200 MG tablet Take 1 tablet (200 mg total) by mouth 2 (two) times daily. Patient taking differently: Take 100 mg by mouth daily. 06/09/21   Fritzi Mandes, MD  amLODipine (NORVASC) 5 MG tablet Take by mouth.    [provider]  apixaban (ELIQUIS) 5 MG TABS tablet Take 1 tablet (5 mg total) by mouth 2 (two) times daily. 06/09/21   Fritzi Mandes, MD  clopidogrel (PLAVIX) 75 MG tablet Take 75 mg by mouth daily. Patient not taking: Reported on 03/08/2022 06/22/21   [provider]  losartan-hydrochlorothiazide (HYZAAR) 50-12.5 MG tablet Take 1 tablet by mouth daily. 10/10/20   [provider]  megestrol (MEGACE) 40 MG tablet Take 1 tablet (40 mg total) by mouth daily. 03/06/22   [name], MD  meloxicam (MOBIC) 15 MG tablet Take 1 tablet (15 mg total) by mouth daily as needed for pain. Patient not taking: Reported on 03/08/2022 02/15/22   [name], MD  potassium chloride SA (KLOR-CON M) 20 MEQ tablet Take 1 tablet (20 mEq total) by mouth daily. 03/06/22 03/06/23  [name], MD  rosuvastatin (CRESTOR) 5 MG tablet Take 2.5 mg by mouth daily.  11/12/21   [provider]  torsemide (DEMADEX) 20 MG tablet Take 1 tablet (20 mg total) by mouth daily. 08/03/21   Fritzi Mandes, MD  traMADol (ULTRAM) 50 MG tablet Take 1 tablet (50 mg total) by mouth 2 (two) times daily. As needed for pain. Patient not taking: Reported on 02/08/2022 01/18/22   [name], MD    Physical Exam: Vitals:   03/23/22 1009 03/23/22 1010 03/23/22 1100 03/23/22 1130  BP: (!) 160/82  (!) 145/79 (!) 146/77  Pulse: 77  73 73  Resp: (!) 22  (!) 27 (!) 25  Temp:  98 F (36.7 C)    SpO2: 100%  95% 97%  Weight:      Height:        Physical Exam Constitutional:      General: He is not in acute distress.    Appearance: Normal appearance.  HENT:     Head: Normocephalic and atraumatic.  Mouth/Throat:     Mouth: Mucous membranes are moist.     Pharynx: Oropharynx is clear.  Eyes:     Extraocular Movements: Extraocular movements intact.     Pupils: Pupils are equal, round, and reactive to light.  Cardiovascular:     Rate and Rhythm: Normal rate and regular rhythm.     Pulses: Normal pulses.     Heart sounds: Normal heart sounds.  Pulmonary:     Effort: Pulmonary effort is normal. No respiratory distress.     Breath sounds: Normal breath sounds.  Abdominal:     General: Bowel sounds are normal. There is no distension.     Palpations: Abdomen is soft.     Tenderness: There is no abdominal tenderness.  Musculoskeletal:        General: No swelling or deformity.  Skin:    General: Skin is warm and dry.  Neurological:     General: No focal deficit present.     Mental Status: Mental status is at baseline.    Labs on Admission: I have personally reviewed following labs and imaging studies  CBC: Recent Labs  Lab 03/23/22 1005  WBC 12.3*  HGB 15.6  HCT 47.4  MCV 83.0  PLT 833    Basic Metabolic Panel: Recent Labs  Lab 03/23/22 1005  NA 133*  K 4.7  CL 99  CO2 25  GLUCOSE 134*  BUN 13  CREATININE 0.79  CALCIUM 8.4*     GFR: Estimated Creatinine Clearance: 55.5 mL/min (by C-G formula based on SCr of 0.79 mg/dL).  Liver Function Tests: Recent Labs  Lab 03/23/22 1005  AST 48*  ALT 28  ALKPHOS 403*  BILITOT 1.0  PROT 7.9  ALBUMIN 4.0    Urine analysis:    Component Value Date/Time   COLORURINE YELLOW (A) 10/04/2020 1239   APPEARANCEUR CLEAR (A) 10/04/2020 1239   APPEARANCEUR Clear 11/28/2013 0605   LABSPEC 1.003 (L) 10/04/2020 1239   LABSPEC 1.010 11/28/2013 0605   PHURINE 7.0 10/04/2020 1239   GLUCOSEU NEGATIVE 10/04/2020 1239   GLUCOSEU 50 mg/dL 11/28/2013 0605   HGBUR NEGATIVE 10/04/2020 1239   BILIRUBINUR NEGATIVE 10/04/2020 1239   BILIRUBINUR Negative 11/28/2013 0605   KETONESUR NEGATIVE 10/04/2020 1239   PROTEINUR NEGATIVE 10/04/2020 1239   NITRITE NEGATIVE 10/04/2020 1239   LEUKOCYTESUR NEGATIVE 10/04/2020 1239   LEUKOCYTESUR Negative 11/28/2013 0605    Radiological Exams on Admission: CT Angio Chest PE W and/or Wo Contrast  Result Date: 03/23/2022 CLINICAL DATA:  Chest pain, pulmonary embolism suspected EXAM: CT ANGIOGRAPHY CHEST WITH CONTRAST TECHNIQUE: Multidetector CT imaging of the chest was performed using the standard protocol during bolus administration of intravenous contrast. Multiplanar CT image reconstructions and MIPs were obtained to evaluate the vascular anatomy. RADIATION DOSE REDUCTION: This exam was performed according to the departmental dose-optimization program which includes automated exposure control, adjustment of the mA and/or kV according to patient size and/or use of iterative reconstruction technique. CONTRAST:  49mL OMNIPAQUE IOHEXOL 350 MG/ML SOLN COMPARISON:  02/26/2022 CT chest abdomen pelvis. FINDINGS: Cardiovascular: Satisfactory opacification of the pulmonary arteries to the segmental level. Thrombus is noted in a right lower lobe posterior segment artery (series 5, image 286). Evidence of right heart strain with an RV:LV ratio of 1.13 and some  reflux of contrast into the hepatic veins, although the appearance of the right ventricle appears similar to 02/26/2022. Chronic stenosis of some left upper lobe pulmonary arteries (series 5, image 174), related to chronic collapse of the posterior  left upper lobe, which also demonstrates radiation fiducials. Normal heart size. Thrombus in the left atrial appendage (series 5, image 196), which is new compared to 02/26/2022. No pericardial effusion. Mediastinum/Nodes: No enlarged mediastinal, hilar, or axillary lymph nodes. Thyroid gland, trachea, and esophagus demonstrate no significant findings. Lungs/Pleura: Redemonstrated innumerable pulmonary nodules, which do seem to have increased in number consistent with metastatic disease. Redemonstrated chronic collapse of a portion of the posterior left upper lobe with radiation fiducials. Upper Abdomen: No acute abnormality. Musculoskeletal: Review of the MIP images confirms the above findings. IMPRESSION: 1. Positive for acute PE in a right lower lobe posterior segment artery with CT evidence of right heart strain (RV/LV Ratio = 1.13) consistent with at least submassive (intermediate risk) PE. The presence of right heart strain has been associated with an increased risk of morbidity and mortality. Please refer to the "Code PE Focused" order set in EPIC. Given the small amount of actual pulmonary thrombus and a similar appearance of the right ventricle on the 02/26/2022 CT, this may be chronic and in part related to stenosis of the left upper lobe pulmonary arteries, secondary to postradiation changes in the left lung. 2. Thrombus in the left atrial appendage, which is new compared to 02/26/2022. 3. Innumerable pulmonary nodules, which have increased in number compared to 02/26/2022, consistent with metastatic disease. These results were called by telephone at the time of interpretation on 03/23/2022 at 12:28 pm to provider [facility] , who verbally acknowledged  these results. Electronically Signed   By: Merilyn Baba M.D.   On: 03/23/2022 12:31   DG Chest Portable 1 View  Result Date: 03/23/2022 CLINICAL DATA:  Chest pain. EXAM: PORTABLE CHEST 1 VIEW COMPARISON:  Prior chest x-ray on 08/01/2021 and CT of the chest on 02/26/2022 FINDINGS: Normal heart size. Stable emphysematous lung disease and again suggestion of multiple bilateral pulmonary nodules as depicted by recent CT. Stable chronic collapse of the medial left upper lobe. No pneumothorax, overt pulmonary edema or pleural fluid identified. IMPRESSION: Stable emphysematous lung disease, multiple bilateral pulmonary nodules and chronic collapse of the medial left upper lobe. Electronically Signed   By: Aletta Edouard M.D.   On: 03/23/2022 10:40    EKG: Independently reviewed.  Sinus rhythm at 79 bpm.  Incomplete right bundle branch block with QRS 105.  Prolonged QTc at 501.  Assessment/Plan Principal Problem:   Acute pulmonary embolus (HCC) Active Problems:   CAD (coronary artery disease)   Hyperlipidemia   Hypertension   Cancer of upper lobe of left lung (HCC)   Diabetes mellitus type 2, uncomplicated (HCC)   AF (paroxysmal atrial fibrillation) (HCC)   CHF (congestive heart failure) (HCC)   Acute pulmonary embolus Right atrial thrombus > Patient presenting with chest pain at 4 AM this morning with some associated nausea and diaphoresis. > Imaging in the ED notable for CTA positive for PE of the right lower lobe and new thrombus in the left atrial appendage.  Some evidence of right heart strain with this appears chronic per prior imaging per radiology.  Mildly elevated troponin at 26 with repeat pending.  BNP mildly elevated at 40. > This is despite taking Eliquis as prescribed for his atrial fibrillation.  Has not missed any doses other than this morning after pain occurred. > Is hypercoagulable in the setting of lung cancer.  Also Megace has been rarely associated with increased risk of VTE  in postmarketing reports.  In addition he is off Plavix now, unclear to what  extent this could have contributed. > Started on heparin in the ED given age and failure of Eliquis. - Monitor on progressive unit for now - Continue with heparin drip - Echocardiogram - Follow-up repeat trend troponin - Hold Megace  CAD HLD > History of MI with associated cardiac arrest > Status post stenting - Continue home Plavix, rosuvastatin - Continue home losartan  Hypertension - Continue home losartan-hydrochlorothiazide - Is prescribed torsemide as needed  Atrial fibrillation - Continue home amiodarone - Currently on heparin therapy as appears to have failed Eliquis with PE and thrombus as above  CHF > Last echo in August 2022 with EF 45-50%, normal RV function, normal diastolic function. - Continue continue home losartan - Currently on torsemide as needed  Lung cancer > History of lung cancer currently treated with immunotherapy and history of radiation therapy. > Current treatment includes Keytruda - Continue to monitor  Diabetes - SSI  DVT prophylaxis: Heparin Code Status:   Full Family Communication:  Updated at bedside Disposition Plan:   Patient is from:  Home  Anticipated DC to:  Home  Anticipated DC date:  1 to 3 days  Anticipated DC barriers: None  Consults called:  None Admission status:  Observation, progressive  Severity of Illness: The appropriate patient status for this patient is OBSERVATION. Observation status is judged to be reasonable and necessary in order to provide the required intensity of service to ensure the patient's safety. The patient's presenting symptoms, physical exam findings, and initial radiographic and laboratory data in the context of their medical condition is felt to place them at decreased risk for further clinical deterioration. Furthermore, it is anticipated that the patient will be medically stable for discharge from the hospital within 2  midnights of admission.    Marcelyn Bruins MD [facility]  How to contact the [facility] Attending or Consulting provider [place] or covering provider during after hours [place], for this patient?   Check the care team in [facility] and look for a) attending/consulting [facility] provider listed and b) the [facility] team listed Log into [email or web address] and use [place]'s universal password to access. If you do not have the password, please contact the hospital operator. Locate the [facility] provider you are looking for under [facility] and page to a number that you can be directly reached. If you still have difficulty reaching the provider, please page the [facility] (Director on Call) for the Hospitalists listed on amion for assistance.  03/23/2022, 12:47 PM

## 2022-03-23 NOTE — ED Provider Notes (Addendum)
[facility] Provider Note    Event Date/Time   First MD Initiated Contact with Patient 03/23/22 1000     (approximate)  History   Chief Complaint: Chest Pain  HPI  Scott Gross is a 88 y.o. male with a past medical history of CAD status post 2 MIs, 2 stents, cardiac arrest, hypertension, hyperlipidemia, presents to the emergency department for chest pain.  According to the patient around 4:00 this morning he awoke with some central chest pain along with nausea and diaphoresis.  States this feels somewhat similar to his last MI presentation approximately 3 to 4 years ago.  Patient states however the pain has completely resolved and is currently pain-free.  Denies any shortness of breath at any point.  Patient states he does have a sense of generalized weakness currently but denies any pain.  Patient does have a history of lung cancer currently on immunotherapy per wife.  Physical Exam   Triage Vital Signs: ED Triage Vitals [03/23/22 1000]  Enc Vitals Group     BP      Pulse      Resp      Temp      Temp src      SpO2      Weight 128 lb 1.4 oz (58.1 kg)     Height 5\' 11"  (1.803 m)     Head Circumference      Peak Flow      Pain Score 3     Pain Loc      Pain Edu?      Excl. in [place]?     Most recent vital signs: There were no vitals filed for this visit.  General: Awake, no distress.  CV:  Good peripheral perfusion.  Regular rate and rhythm  Resp:  Normal effort.  Equal breath sounds bilaterally.  Abd:  No distention.  Soft, nontender.  No rebound or guarding.   ED Results / Procedures / Treatments   EKG  EKG viewed and interpreted by myself shows a normal sinus rhythm at 79 bpm with a narrow QRS, normal axis, normal intervals, no concerning ST changes.  RADIOLOGY  I have reviewed the chest x-ray images.  Patient has some mild opacities bilaterally on my interpretation. Radiology is read the x-ray as stable emphysematous changes multiple  pulmonary nodules noted.  Chronic collapse of left upper lobe   MEDICATIONS ORDERED IN ED: Medications - No data to display   IMPRESSION / MDM / [place] / ED COURSE  I reviewed the triage vital signs and the nursing notes.  Patient's presentation is most consistent with acute presentation with potential threat to life or bodily function.  Patient presents to the emergency department for chest pain.  Patient has a significant cardiac history including cardiac arrest 2 prior MIs and 2 stents.  We will check labs including cardiac enzymes, chest x-ray and EKG.  We will continue to closely monitor.  Patient arrived and remains chest pain-free.  Differential would include ACS, pneumonia, musculoskeletal pain, pneumothorax, PE.  Patient's work-up overall reassuring with troponin of 26 largely unchanged from historical values, chemistry shows normal renal function slight leukocytosis of 12,000 on the CBC.  Given the patient's history of lung cancer with acute onset of chest symptoms nausea and diaphoresis earlier this morning we will obtain CT imaging of the chest to rule out pulmonary embolism.  Radiology is called me regarding the CT scan of the chest.  They state patient  does have a small amount of pulmonary embolism in the right lung and does have some right heart strain however they state the right heart strain appears largely unchanged from May when the patient did not have PE but continues to have pulmonary artery compression from the radiation treatment and the right heart strain is likely chronic and unrelated to the small amount of PE.  Patient also appears to have thrombus in the left atria.  We will start the patient on a heparin infusion.  We will admit to the hospitalist service.  I spoke to the family they are agreeable to this plan.  CRITICAL CARE Performed by: Harvest Dark   Total critical care time: 30 minutes  Critical care time was exclusive of separately  billable procedures and treating other patients.  Critical care was necessary to treat or prevent imminent or life-threatening deterioration.  Critical care was time spent personally by me on the following activities: development of treatment plan with patient and/or surrogate as well as nursing, discussions with consultants, evaluation of patient's response to treatment, examination of patient, obtaining history from patient or surrogate, ordering and performing treatments and interventions, ordering and review of laboratory studies, ordering and review of radiographic studies, pulse oximetry and re-evaluation of patient's condition.   FINAL CLINICAL IMPRESSION(S) / ED DIAGNOSES   Chest pain Pulmonary embolism Atrial thrombus  Note:  This document was prepared using Dragon voice recognition software and may include unintentional dictation errors.   Harvest Dark, MD 03/23/22 1230    Harvest Dark, MD 03/23/22 1230

## 2022-03-23 NOTE — ED Notes (Signed)
First Nurse Note: Pt to ED via [facility] for Chest pain this morning. Pt is also having decreased appetite and nausea. Pt is on immunotherapy currently.

## 2022-03-23 NOTE — Progress Notes (Signed)
ANTICOAGULATION CONSULT NOTE - Initial Consult  Pharmacy Consult for Heparin Drip Indication: pulmonary embolus  Allergies  Allergen Reactions   Statins Other (See Comments)    Arthralagia    Patient Measurements: Height: 5\' 11"  (180.3 cm) Weight: 58.1 kg (128 lb 1.4 oz) IBW/kg (Calculated) : 75.3 Heparin Dosing Weight: 58.1 kg  Vital Signs: Temp: 98 F (36.7 C) (06/09 1010) BP: 146/77 (06/09 1130) Pulse Rate: 73 (06/09 1130)  Labs: Recent Labs    03/23/22 1005 03/23/22 1217 03/23/22 1330  HGB 15.6  --   --   HCT 47.4  --   --   PLT 235  --   --   APTT  --   --  27  LABPROT  --   --  14.8  INR  --   --  1.2  HEPARINUNFRC  --   --  0.78*  CREATININE 0.79  --   --   TROPONINIHS 26* 28*  --     Estimated Creatinine Clearance: 55.5 mL/min (by C-G formula based on SCr of 0.79 mg/dL).   Medical History: Past Medical History:  Diagnosis Date   Arthritis    CAD (coronary artery disease) Nov. 12, 2012   Inferior ST elevation MI in November of 2012 with ventricular fibrillation arrest. Status post drug-eluting stent placement to the mid LAD. Most recent cardiac catheterization in June of this year showed patent stent with minimal restenosis, 75% proximal LAD and 80% distal LAD which were unchanged from before. Mild disease in the left circumflex and moderate RCA disease. Ejection fraction was 55%.   Cancer of upper lobe of left lung ([place]) 11/2016   Rad tx's.   Cardiac arrest [facility]) Nov. 2012   x 2    Cardiac arrest - ventricular fibrillation    HOH (hard of hearing)    Left Hearing Aid   Hyperlipidemia    Hypertension    Post PTCA 2013   x2   ST elevation MI (STEMI) ([place]) 08/26/2011    Medications:  Apixaban 5mg  bid prior to admission, last dose on 03/22/22 in the AM.  Assessment: Patient is a 88yo male presented with chest pain and found to have a small PE. Pharmacy is consulted for Heparin dosing.  Baseline Labs: HL 0.78, others within range  Goal of  Therapy:  Heparin level 0.3-0.7 units/ml aPTT 66-102 seconds Monitor platelets by anticoagulation protocol: Yes   Plan:  Give 4000 units bolus x 1 Start heparin infusion at 1000 units/hr Continue to monitor H&H and platelets Will use aPTT to guide therapy as HL is elevated at baseline due to apixaban use. Will check HL with AM labs to check for correlation. Next aPTT in 8 hours.  Paulina Fusi, PharmD, BCPS 03/23/2022 2:03 PM

## 2022-03-24 ENCOUNTER — Inpatient Hospital Stay
Admit: 2022-03-24 | Discharge: 2022-03-24 | Disposition: A | Discharge status: Home / Self Care | Payer: Medicare HMO | Attending: Internal Medicine | Admitting: Internal Medicine

## 2022-03-24 ENCOUNTER — Encounter: Payer: Self-pay | Admitting: Internal Medicine

## 2022-03-24 ENCOUNTER — Inpatient Hospital Stay: Payer: Medicare HMO

## 2022-03-24 DIAGNOSIS — I2699 Other pulmonary embolism without acute cor pulmonale: Secondary | ICD-10-CM | POA: Diagnosis present

## 2022-03-24 DIAGNOSIS — Z87891 Personal history of nicotine dependence: Secondary | ICD-10-CM | POA: Diagnosis not present

## 2022-03-24 DIAGNOSIS — I5042 Chronic combined systolic (congestive) and diastolic (congestive) heart failure: Secondary | ICD-10-CM | POA: Diagnosis present

## 2022-03-24 DIAGNOSIS — E43 Unspecified severe protein-calorie malnutrition: Secondary | ICD-10-CM | POA: Diagnosis present

## 2022-03-24 DIAGNOSIS — I5022 Chronic systolic (congestive) heart failure: Secondary | ICD-10-CM | POA: Diagnosis not present

## 2022-03-24 DIAGNOSIS — I513 Intracardiac thrombosis, not elsewhere classified: Secondary | ICD-10-CM | POA: Diagnosis present

## 2022-03-24 DIAGNOSIS — Z8249 Family history of ischemic heart disease and other diseases of the circulatory system: Secondary | ICD-10-CM | POA: Diagnosis not present

## 2022-03-24 DIAGNOSIS — Z1152 Encounter for screening for COVID-19: Secondary | ICD-10-CM | POA: Diagnosis not present

## 2022-03-24 DIAGNOSIS — Z888 Allergy status to other drugs, medicaments and biological substances status: Secondary | ICD-10-CM | POA: Diagnosis not present

## 2022-03-24 DIAGNOSIS — Z955 Presence of coronary angioplasty implant and graft: Secondary | ICD-10-CM | POA: Diagnosis not present

## 2022-03-24 DIAGNOSIS — D72829 Elevated white blood cell count, unspecified: Secondary | ICD-10-CM | POA: Diagnosis present

## 2022-03-24 DIAGNOSIS — I2609 Other pulmonary embolism with acute cor pulmonale: Secondary | ICD-10-CM | POA: Diagnosis not present

## 2022-03-24 DIAGNOSIS — R600 Localized edema: Secondary | ICD-10-CM | POA: Diagnosis not present

## 2022-03-24 DIAGNOSIS — I2510 Atherosclerotic heart disease of native coronary artery without angina pectoris: Secondary | ICD-10-CM | POA: Diagnosis present

## 2022-03-24 DIAGNOSIS — I110 Hypertensive heart disease with heart failure: Secondary | ICD-10-CM | POA: Diagnosis present

## 2022-03-24 DIAGNOSIS — E119 Type 2 diabetes mellitus without complications: Secondary | ICD-10-CM | POA: Diagnosis present

## 2022-03-24 DIAGNOSIS — R64 Cachexia: Secondary | ICD-10-CM | POA: Diagnosis present

## 2022-03-24 DIAGNOSIS — M1990 Unspecified osteoarthritis, unspecified site: Secondary | ICD-10-CM | POA: Diagnosis present

## 2022-03-24 DIAGNOSIS — I252 Old myocardial infarction: Secondary | ICD-10-CM | POA: Diagnosis not present

## 2022-03-24 DIAGNOSIS — Z7901 Long term (current) use of anticoagulants: Secondary | ICD-10-CM | POA: Diagnosis not present

## 2022-03-24 DIAGNOSIS — Z681 Body mass index (BMI) 19 or less, adult: Secondary | ICD-10-CM | POA: Diagnosis not present

## 2022-03-24 DIAGNOSIS — I480 Paroxysmal atrial fibrillation: Secondary | ICD-10-CM | POA: Diagnosis present

## 2022-03-24 DIAGNOSIS — Z8674 Personal history of sudden cardiac arrest: Secondary | ICD-10-CM | POA: Diagnosis not present

## 2022-03-24 DIAGNOSIS — C3412 Malignant neoplasm of upper lobe, left bronchus or lung: Secondary | ICD-10-CM | POA: Diagnosis present

## 2022-03-24 DIAGNOSIS — J439 Emphysema, unspecified: Secondary | ICD-10-CM | POA: Diagnosis present

## 2022-03-24 DIAGNOSIS — Z923 Personal history of irradiation: Secondary | ICD-10-CM | POA: Diagnosis not present

## 2022-03-24 DIAGNOSIS — E785 Hyperlipidemia, unspecified: Secondary | ICD-10-CM | POA: Diagnosis present

## 2022-03-24 LAB — CBC
HCT: 41.3 % (ref 39.0–52.0)
Hemoglobin: 13.9 g/dL (ref 13.0–17.0)
MCH: 27.3 pg (ref 26.0–34.0)
MCHC: 33.7 g/dL (ref 30.0–36.0)
MCV: 81.1 fL (ref 80.0–100.0)
Platelets: 230 10*3/uL (ref 150–400)
RBC: 5.09 MIL/uL (ref 4.22–5.81)
RDW: 15.1 % (ref 11.5–15.5)
WBC: 12 10*3/uL — ABNORMAL HIGH (ref 4.0–10.5)
nRBC: 0 % (ref 0.0–0.2)

## 2022-03-24 LAB — COMPREHENSIVE METABOLIC PANEL
ALT: 26 U/L (ref 0–44)
AST: 32 U/L (ref 15–41)
Albumin: 3.6 g/dL (ref 3.5–5.0)
Alkaline Phosphatase: 393 U/L — ABNORMAL HIGH (ref 38–126)
Anion gap: 7 (ref 5–15)
BUN: 12 mg/dL (ref 8–23)
CO2: 25 mmol/L (ref 22–32)
Calcium: 8.4 mg/dL — ABNORMAL LOW (ref 8.9–10.3)
Chloride: 100 mmol/L (ref 98–111)
Creatinine, Ser: 0.76 mg/dL (ref 0.61–1.24)
GFR, Estimated: >60 mL/min (ref >60)
Glucose, Bld: 131 mg/dL — ABNORMAL HIGH (ref 70–99)
Potassium: 4.6 mmol/L (ref 3.5–5.1)
Sodium: 132 mmol/L — ABNORMAL LOW (ref 135–145)
Total Bilirubin: 0.7 mg/dL (ref 0.3–1.2)
Total Protein: 6.8 g/dL (ref 6.5–8.1)

## 2022-03-24 LAB — HEPARIN LEVEL (UNFRACTIONATED)
Heparin Unfractionated: 0.63 IU/mL (ref 0.30–0.70)
Heparin Unfractionated: 0.51 IU/mL (ref 0.30–0.70)

## 2022-03-24 LAB — ECHOCARDIOGRAM COMPLETE
AR max vel: 1.72 cm2
AV Peak grad: 6.8 mmHg
Ao pk vel: 1.3 m/s
Area-P 1/2: 4.77 cm2
Calc EF: 45.1 %
Height: 71 in
P 1/2 time: 508 msec
S' Lateral: 2.77 cm
Single Plane A2C EF: 44.4 %
Single Plane A4C EF: 50.1 %
Weight: 2049.4 oz

## 2022-03-24 LAB — GLUCOSE, CAPILLARY
Glucose-Capillary: 134 mg/dL — ABNORMAL HIGH (ref 70–99)
Glucose-Capillary: 123 mg/dL — ABNORMAL HIGH (ref 70–99)
Glucose-Capillary: 125 mg/dL — ABNORMAL HIGH (ref 70–99)
Glucose-Capillary: 141 mg/dL — ABNORMAL HIGH (ref 70–99)

## 2022-03-24 LAB — APTT: aPTT: 86 seconds — ABNORMAL HIGH (ref 24–36)

## 2022-03-24 NOTE — Assessment & Plan Note (Signed)
Continue Cozaar, HCTZ and adjust blood pressure medicine as need

## 2022-03-24 NOTE — Assessment & Plan Note (Signed)
Followed by Dr. Rogue Bussing

## 2022-03-24 NOTE — Assessment & Plan Note (Addendum)
Continue Crestor, Cozaar, HCTZ, amiodarone and heparin drip.  Per family patient's Plavix was stopped about 2 weeks ago by his cardiology Dr. Clayborn Bigness

## 2022-03-24 NOTE — Progress Notes (Signed)
ANTICOAGULATION CONSULT NOTE -   Pharmacy Consult for Heparin Drip Indication: pulmonary embolus  Allergies  Allergen Reactions   Statins Other (See Comments)    Arthralagia    Patient Measurements: Height: 5\' 11"  (180.3 cm) Weight: 58.1 kg (128 lb 1.4 oz) IBW/kg (Calculated) : 75.3 Heparin Dosing Weight: 58.1 kg  Vital Signs: Temp: 98.5 F (36.9 C) (06/10 0815) Temp Source: Oral (06/10 0815) BP: 156/81 (06/10 0815) Pulse Rate: 80 (06/10 0815)  Labs: Recent Labs    03/23/22 1005 03/23/22 1217 03/23/22 1330 03/23/22 2205 03/24/22 0749  HGB 15.6  --   --   --  13.9  HCT 47.4  --   --   --  41.3  PLT 235  --   --   --  230  APTT  --   --  27 125* 86*  LABPROT  --   --  14.8  --   --   INR  --   --  1.2  --   --   HEPARINUNFRC  --   --  0.78*  --  0.63  CREATININE 0.79  --   --   --  0.76  TROPONINIHS 26* 28*  --   --   --      Estimated Creatinine Clearance: 55.5 mL/min (by C-G formula based on SCr of 0.76 mg/dL).   Medical History: Past Medical History:  Diagnosis Date   Arthritis    CAD (coronary artery disease) Nov. 12, 2012   Inferior ST elevation MI in November of 2012 with ventricular fibrillation arrest. Status post drug-eluting stent placement to the mid LAD. Most recent cardiac catheterization in June of this year showed patent stent with minimal restenosis, 75% proximal LAD and 80% distal LAD which were unchanged from before. Mild disease in the left circumflex and moderate RCA disease. Ejection fraction was 55%.   Cancer of upper lobe of left lung ([place]) 11/2016   Rad tx's.   Cardiac arrest [facility]) Nov. 2012   x 2    Cardiac arrest - ventricular fibrillation    HOH (hard of hearing)    Left Hearing Aid   Hyperlipidemia    Hypertension    Post PTCA 2013   x2   ST elevation MI (STEMI) ([place]) 08/26/2011    Medications:  Apixaban 5mg  bid prior to admission, last dose on 03/22/22 in the AM.  Assessment: Patient is a 88yo male presented with chest  pain and found to have a small PE. Pharmacy is consulted for Heparin dosing.  Baseline Labs: HL 0.78, others within range  6/9: APTT @ 2205 = 125,  supratherapeutic   decrease to 900 units/hr  6/10: APTT @ 0749= 86 therapeutic  (HL=0.63 therapeutic, now correlating)   continue current rate  Goal of Therapy:  Heparin level 0.3-0.7 units/ml aPTT 66-102 seconds Monitor platelets by anticoagulation protocol: Yes   Plan:  6/10: APTT @ 0749= 86 therapeutic  (HL=0.63, now correlating) Will continue heparin drip at 900 units/hr and f/u confirmatory HL in 8 hrs CBC daily  Russell Quinney A 03/24/2022 8:37 AM

## 2022-03-24 NOTE — Progress Notes (Signed)
*  PRELIMINARY RESULTS* Echocardiogram 2D Echocardiogram has been performed.  Claretta Fraise 03/24/2022, 3:21 PM

## 2022-03-24 NOTE — Assessment & Plan Note (Signed)
Continue heparin drip for now.  Oncology consult.  Will obtain echo and lower extremity Dopplers to rule out DVT With his history of cancer and use of Megace he is high risk for blood clots He was on Eliquis for A-fib which is switched to heparin for now.  We will let oncology decide on long-term anticoagulation plan

## 2022-03-24 NOTE — Progress Notes (Signed)
ANTICOAGULATION CONSULT NOTE -   Pharmacy Consult for Heparin Drip Indication: pulmonary embolus  Allergies  Allergen Reactions   Statins Other (See Comments)    Arthralagia    Patient Measurements: Height: 5\' 11"  (180.3 cm) Weight: 58.1 kg (128 lb 1.4 oz) IBW/kg (Calculated) : 75.3 Heparin Dosing Weight: 58.1 kg  Vital Signs: Temp: 97.8 F (36.6 C) (06/10 1657) Temp Source: Oral (06/10 1657) BP: 124/78 (06/10 1657) Pulse Rate: 80 (06/10 0815)  Labs: Recent Labs    03/23/22 1005 03/23/22 1217 03/23/22 1330 03/23/22 2205 03/24/22 0749 03/24/22 1703  HGB 15.6  --   --   --  13.9  --   HCT 47.4  --   --   --  41.3  --   PLT 235  --   --   --  230  --   APTT  --   --  27 125* 86*  --   LABPROT  --   --  14.8  --   --   --   INR  --   --  1.2  --   --   --   HEPARINUNFRC  --   --  0.78*  --  0.63 0.51  CREATININE 0.79  --   --   --  0.76  --   TROPONINIHS 26* 28*  --   --   --   --      Estimated Creatinine Clearance: 55.5 mL/min (by C-G formula based on SCr of 0.76 mg/dL).   Medical History: Past Medical History:  Diagnosis Date   Arthritis    CAD (coronary artery disease) Nov. 12, 2012   Inferior ST elevation MI in November of 2012 with ventricular fibrillation arrest. Status post drug-eluting stent placement to the mid LAD. Most recent cardiac catheterization in June of this year showed patent stent with minimal restenosis, 75% proximal LAD and 80% distal LAD which were unchanged from before. Mild disease in the left circumflex and moderate RCA disease. Ejection fraction was 55%.   Cancer of upper lobe of left lung ([place]) 11/2016   Rad tx's.   Cardiac arrest [facility] Nov. 2012   x 2    Cardiac arrest - ventricular fibrillation    HOH (hard of hearing)    Left Hearing Aid   Hyperlipidemia    Hypertension    Post PTCA 2013   x2   ST elevation MI (STEMI) ([place]) 08/26/2011    Medications:  Apixaban 5mg  bid prior to admission, last dose on 03/22/22 in the  AM.  Assessment: Patient is a 88yo male presented with chest pain and found to have a small PE. Pharmacy is consulted for Heparin dosing.  Baseline Labs: HL 0.78, others within range  Goal of Therapy:  Heparin level 0.3-0.7 units/ml aPTT 66-102 seconds Monitor platelets by anticoagulation protocol: Yes   Results: 6/9: APTT @ 2205 = 125,  supratherapeutic   decrease to 900 units/hr  6/10: APTT @ 0749= 86 therapeutic  (HL=0.63 therapeutic, now correlating)   continue current rate 6/11: HL @ 1703 = 0.51, therapeutic x 2 @ 900 units/hr  Plan:  Will continue heparin drip at 900 units/hr  Order HL with AM labs CBC daily while on heparin gtt  Ezequiel Macauley Rodriguez-Guzman PharmD, BCPS 03/24/2022 5:49 PM

## 2022-03-24 NOTE — Assessment & Plan Note (Signed)
Sliding scale for now.  A1c 6.2

## 2022-03-24 NOTE — Hospital Course (Addendum)
Patient is an 88 year old male with past medical history of CAD status post stenting, left upper lobe lung cancer, atrial fibrillation and systolic/diastolic heart failure admitted on 6/9 after presenting with chest pain and found to have an acute pulmonary embolus despite Eliquis.  In addition, CT scan chest also noted left atrial thrombus.  Patient admitted to the hospitalist service and started on heparin.  Patient seen by oncology who recommended patient be switched over from Eliquis to Lovenox full dosing.

## 2022-03-24 NOTE — Assessment & Plan Note (Addendum)
Overall poor p.o. intake.  Encouraged oral nutrition BMI 17.8

## 2022-03-24 NOTE — Assessment & Plan Note (Addendum)
Well compensated at this time.  Continue losartan.  EF 45 to 50% on last echo

## 2022-03-24 NOTE — Assessment & Plan Note (Signed)
Continue Crestor 

## 2022-03-24 NOTE — Consult Note (Signed)
[place] NOTE  Patient Care Team: [name], MD as PCP - General (Family Medicine) [name], MD as Consulting Physician (Oncology)  CHIEF COMPLAINTS/PURPOSE OF CONSULTATION: Lung CANCER/acute PE  Oncology History  Cancer of upper lobe of left lung (Scott Gross)  09/10/2016 Initial Diagnosis   Cancer of upper lobe of left lung (Scott Gross)   12/06/2021 -  Chemotherapy   Patient is on Treatment Plan : LUNG NSCLC Pembrolizumab (200) q21d        HISTORY OF PRESENTING ILLNESS:  Scott Gross 88 y.o.  male very pleasant patient with multiple medical problems including CAD s/p stenting hyperlipidemia hypertension diabetes, A-fib CHF cancer is currently admitted to hospital for chest pain.  On evaluation during admission patient had a CT scan Chest: Right sided PE; right heart strain.  And also increase in the bilateral lung nodules concerning for progression of disease.  Patient follows up with Dr. Grayland Ormond on an outpatient basis.  Patient is on Eliquis for history of A-fib.  Admits to compliance.  However notes that patient was recently taken off Plavix about 2 weeks ago.   With regards to lung cancer-patient has stage IV adenocarcinoma currently on [place] has been consulted for further evaluation and recommendations with regards to new onset of PE while on Eliquis in the context of patient's lung cancer.   Since being admitted to hospital patient-is currently on IV heparin.  Notes to have improvement of his breathing.  Although shortness of breath not complete resolved  Review of Systems  Constitutional:  Positive for malaise/fatigue. Negative for chills, diaphoresis, fever and weight loss.  HENT:  Negative for nosebleeds and sore throat.   Eyes:  Negative for double vision.  Respiratory:  Positive for shortness of breath. Negative for cough, hemoptysis, sputum production and wheezing.   Cardiovascular:  Negative for chest pain, palpitations,  orthopnea and leg swelling.  Gastrointestinal:  Negative for abdominal pain, blood in stool, constipation, diarrhea, heartburn, melena, nausea and vomiting.  Genitourinary:  Negative for dysuria, frequency and urgency.  Musculoskeletal:  Positive for back pain and joint pain.  Skin: Negative.  Negative for itching and rash.  Neurological:  Negative for dizziness, tingling, focal weakness, weakness and headaches.  Endo/Heme/Allergies:  Does not bruise/bleed easily.  Psychiatric/Behavioral:  Negative for depression. The patient is not nervous/anxious and does not have insomnia.     MEDICAL HISTORY:  Past Medical History:  Diagnosis Date   Arthritis    CAD (coronary artery disease) Nov. 12, 2012   Inferior ST elevation MI in November of 2012 with ventricular fibrillation arrest. Status post drug-eluting stent placement to the mid LAD. Most recent cardiac catheterization in June of this year showed patent stent with minimal restenosis, 75% proximal LAD and 80% distal LAD which were unchanged from before. Mild disease in the left circumflex and moderate RCA disease. Ejection fraction was 55%.   Cancer of upper lobe of left lung (Scott Gross) 11/2016   Rad tx's.   Cardiac arrest [facility]) Nov. 2012   x 2    Cardiac arrest - ventricular fibrillation    HOH (hard of hearing)    Left Hearing Aid   Hyperlipidemia    Hypertension    Post PTCA 2013   x2   ST elevation MI (STEMI) ([place]) 08/26/2011    SURGICAL HISTORY: Past Surgical History:  Procedure Laterality Date   CARDIAC CATHETERIZATION  2013   @ [place]; [place] s/p stent    CORONARY ANGIOPLASTY  CORONARY/GRAFT ACUTE MI REVASCULARIZATION N/A 06/06/2021   Procedure: Coronary/Graft Acute MI Revascularization;  Surgeon: Wellington Hampshire, MD;  Location: [place] [facility];  Service: Cardiovascular;  Laterality: N/A;   ELECTROMAGNETIC NAVIGATION BROCHOSCOPY N/A 11/27/2016   Procedure: ELECTROMAGNETIC NAVIGATION BRONCHOSCOPY;  Surgeon: [name],  MD;  Location: [facility];  Service: Cardiopulmonary;  Laterality: N/A;   LEFT HEART CATH AND CORONARY ANGIOGRAPHY N/A 06/06/2021   Procedure: LEFT HEART CATH AND CORONARY ANGIOGRAPHY;  Surgeon: Wellington Hampshire, MD;  Location: [place] [facility];  Service: Cardiovascular;  Laterality: N/A;    SOCIAL HISTORY: Social History   Socioeconomic History   Marital status: Married    Spouse name: Not on file   Number of children: Not on file   Years of education: Not on file   Highest education level: Not on file  Occupational History   Not on file  Tobacco Use   Smoking status: Former    Packs/day: 1.00    Types: Cigarettes    Quit date: 08/27/2011    Years since quitting: 10.5   Smokeless tobacco: Never  Vaping Use   Vaping Use: Never used  Substance and Sexual Activity   Alcohol use: No   Drug use: No   Sexual activity: Not Currently  Other Topics Concern   Not on file  Social History Narrative   Not on file   Social Determinants of Health   Financial Resource Strain: Low Risk  (05/23/2019)   Overall Financial Resource Strain (CARDIA)    Difficulty of Paying Living Expenses: Not hard at all  Food Insecurity: Unknown (05/23/2019)   Hunger Vital Sign    Worried About [place] in the Last Year: Patient refused    [place] in the Last Year: Patient refused  Transportation Needs: Unknown (05/23/2019)   [place] - Transportation    Lack of Transportation (Medical): Patient refused    Lack of Transportation (Non-Medical): Patient refused  Physical Activity: Unknown (05/23/2019)   Exercise Vital Sign    Days of Exercise per Week: Patient refused    Minutes of Exercise per Session: Patient refused  Stress: No Stress Concern Present (05/23/2019)   [place] of Stress : Not at all  Social Connections: Unknown (05/23/2019)   Social Connection and Isolation Panel [NHANES]    Frequency of  Communication with Friends and Family: Patient refused    Frequency of Social Gatherings with Friends and Family: Patient refused    Attends Religious Services: Patient refused    Active Member of Clubs or Organizations: Patient refused    Attends Archivist Meetings: Patient refused    Marital Status: Patient refused  Intimate Partner Violence: Unknown (05/23/2019)   Humiliation, Afraid, Rape, and Kick questionnaire    Fear of Current or Ex-Partner: Patient refused    Emotionally Abused: Patient refused    Physically Abused: Patient refused    Sexually Abused: Patient refused    FAMILY HISTORY: Family History  Problem Relation Age of Onset   Heart attack Mother    Heart attack Father     ALLERGIES:  is allergic to statins.  MEDICATIONS:  Current Facility-Administered Medications  Medication Dose Route Frequency Provider Last Rate Last Admin   acetaminophen (TYLENOL) tablet 650 mg  650 mg Oral Q6H PRN Marcelyn Bruins, MD       Or   acetaminophen (TYLENOL) suppository 650 mg  650 mg Rectal  Q6H PRN Marcelyn Bruins, MD       amiodarone (PACERONE) tablet 100 mg  100 mg Oral Daily Marcelyn Bruins, MD   100 mg at 03/24/22 1054   heparin ADULT infusion 100 units/mL (25000 units/276mL)  900 Units/hr Intravenous Continuous Marcelyn Bruins, MD 9 mL/hr at 03/24/22 1055 900 Units/hr at 03/24/22 1055   losartan (COZAAR) tablet 50 mg  50 mg Oral Daily Vira Blanco, RPH   50 mg at 03/24/22 1053   And   hydrochlorothiazide (HYDRODIURIL) tablet 12.5 mg  12.5 mg Oral Daily Vira Blanco, RPH   12.5 mg at 03/24/22 1054   insulin aspart (novoLOG) injection 0-9 Units  0-9 Units Subcutaneous TID WC Marcelyn Bruins, MD   1 Units at 03/24/22 0804   polyethylene glycol (MIRALAX / GLYCOLAX) packet 17 g  17 g Oral Daily PRN Marcelyn Bruins, MD       rosuvastatin (CRESTOR) tablet 2.5 mg  2.5 mg Oral Daily Marcelyn Bruins, MD   2.5 mg at 03/24/22 1054   sodium chloride  flush (NS) 0.9 % injection 3 mL  3 mL Intravenous Q12H Marcelyn Bruins, MD   3 mL at 03/23/22 2237    PHYSICAL EXAMINATION:   Vitals:   03/24/22 1657 03/24/22 1915  BP: 124/78 (!) 143/79  Pulse:  84  Resp: (!) 22 20  Temp: 97.8 F (36.6 C) 98.4 F (36.9 C)  SpO2: 97% 97%   Filed Weights   03/23/22 1000  Weight: 128 lb 1.4 oz (58.1 kg)    Elderly male patient frail-appearing resting in the chair.  Accompanied by wife.  Physical Exam Vitals and nursing note reviewed.  HENT:     Head: Normocephalic and atraumatic.     Mouth/Throat:     Pharynx: Oropharynx is clear.  Eyes:     Extraocular Movements: Extraocular movements intact.     Pupils: Pupils are equal, round, and reactive to light.  Cardiovascular:     Rate and Rhythm: Normal rate and regular rhythm.  Pulmonary:     Comments: Decreased breath sounds bilaterally.  Abdominal:     Palpations: Abdomen is soft.  Musculoskeletal:        General: Normal range of motion.     Cervical back: Normal range of motion.  Skin:    General: Skin is warm.  Neurological:     General: No focal deficit present.     Mental Status: He is alert and oriented to person, place, and time.  Psychiatric:        Behavior: Behavior normal.        Judgment: Judgment normal.     LABORATORY DATA:  I have reviewed the data as listed Lab Results  Component Value Date   WBC 12.0 (H) 03/24/2022   HGB 13.9 03/24/2022   HCT 41.3 03/24/2022   MCV 81.1 03/24/2022   PLT 230 03/24/2022   Recent Labs    03/08/22 0834 03/23/22 1005 03/24/22 0749  NA 134* 133* 132*  K 3.9 4.7 4.6  CL 99 99 100  CO2 27 25 25   GLUCOSE 197* 134* 131*  BUN 14 13 12   CREATININE 1.06 0.79 0.76  CALCIUM 8.2* 8.4* 8.4*  GFRNONAA >60 >60 >60  PROT 6.8 7.9 6.8  ALBUMIN 3.6 4.0 3.6  AST 28 48* 32  ALT 14 28 26   ALKPHOS 341* 403* 393*  BILITOT 0.8 1.0 0.7    RADIOGRAPHIC STUDIES: I have personally reviewed the radiological  images as listed and agreed  with the findings in the report. ECHOCARDIOGRAM COMPLETE  Result Date: 03/24/2022    ECHOCARDIOGRAM REPORT   Patient Name:   Scott Gross Date of Exam: 03/24/2022 Medical Rec #:  [id number]       Height:       71.0 in Accession #:    [id number]      Weight:       128.1 lb Date of Birth:  1934-10-13       BSA:          1.745 m Patient Age:    59 years        BP:           156/81 mmHg Patient Gender: M               HR:           82 bpm. Exam Location:  [facility] Procedure: 2D Echo Indications:     Pulmonary Embolus I26.09  History:         Patient has prior history of Echocardiogram examinations, most                  recent 06/06/2021.  Sonographer:     Kathlen Brunswick RDCS Referring Phys:  [place] Diagnosing Phys: [place]  1. Left ventricular ejection fraction, by estimation, is 45 to 50%. The left ventricle has mildly decreased function. The left ventricle demonstrates global hypokinesis. There is mild concentric left ventricular hypertrophy. Left ventricular diastolic parameters are consistent with Grade I diastolic dysfunction (impaired relaxation).  2. Right ventricular systolic function is normal. The right ventricular size is normal.  3. Left atrial size was mildly dilated.  4. Right atrial size was mildly dilated.  5. The mitral valve is normal in structure. Trivial mitral valve regurgitation. No evidence of mitral stenosis.  6. The aortic valve is normal in structure. Aortic valve regurgitation is mild to moderate. Aortic valve sclerosis is present, with no evidence of aortic valve stenosis.  7. The inferior vena cava is normal in size with greater than 50% respiratory variability, suggesting right atrial pressure of 3 mmHg. FINDINGS  Left Ventricle: Left ventricular ejection fraction, by estimation, is 45 to 50%. The left ventricle has mildly decreased function. The left ventricle demonstrates global hypokinesis. The left ventricular internal cavity size was normal in size. There  is  mild concentric left ventricular hypertrophy. Left ventricular diastolic parameters are consistent with Grade I diastolic dysfunction (impaired relaxation). Right Ventricle: The right ventricular size is normal. No increase in right ventricular wall thickness. Right ventricular systolic function is normal. Left Atrium: Left atrial size was mildly dilated. Right Atrium: Right atrial size was mildly dilated. Pericardium: There is no evidence of pericardial effusion. Mitral Valve: The mitral valve is normal in structure. Trivial mitral valve regurgitation. No evidence of mitral valve stenosis. Tricuspid Valve: The tricuspid valve is normal in structure. Tricuspid valve regurgitation is trivial. No evidence of tricuspid stenosis. Aortic Valve: The aortic valve is normal in structure. Aortic valve regurgitation is mild to moderate. Aortic regurgitation PHT measures 508 msec. Aortic valve sclerosis is present, with no evidence of aortic valve stenosis. Aortic valve peak gradient measures 6.8 mmHg. Pulmonic Valve: The pulmonic valve was normal in structure. Pulmonic valve regurgitation is trivial. No evidence of pulmonic stenosis. Aorta: The aortic root is normal in size and structure. Venous: The inferior vena cava is normal in size with greater than 50% respiratory variability, suggesting  right atrial pressure of 3 mmHg. IAS/Shunts: No atrial level shunt detected by color flow Doppler.  LEFT VENTRICLE PLAX 2D LVIDd:         3.56 cm     Diastology LVIDs:         2.77 cm     LV e' medial:    3.81 cm/s LV PW:         1.00 cm     LV E/e' medial:  12.3 LV IVS:        0.78 cm     LV e' lateral:   3.92 cm/s LVOT diam:     1.90 cm     LV E/e' lateral: 11.9 LV SV:         44 LV SV Index:   25 LVOT Area:     2.84 cm  LV Volumes (MOD) LV vol d, MOD A2C: 39.6 ml LV vol d, MOD A4C: 84.2 ml LV vol s, MOD A2C: 22.0 ml LV vol s, MOD A4C: 42.0 ml LV SV MOD A2C:     17.6 ml LV SV MOD A4C:     84.2 ml LV SV MOD BP:      29.5 ml RIGHT  VENTRICLE RV Basal diam:  2.50 cm RV S prime:     12.00 cm/s TAPSE (M-mode): 2.4 cm LEFT ATRIUM             Index        RIGHT ATRIUM          Index LA diam:        2.50 cm 1.43 cm/m   RA Area:     7.92 cm LA Vol (A2C):   35.1 ml 20.12 ml/m  RA Volume:   11.50 ml 6.59 ml/m LA Vol (A4C):   22.3 ml 12.78 ml/m LA Biplane Vol: 29.0 ml 16.62 ml/m  AORTIC VALVE                 PULMONIC VALVE AV Area (Vmax): 1.72 cm     PV Vmax:       0.98 m/s AV Vmax:        130.00 cm/s  PV Peak grad:  3.8 mmHg AV Peak Grad:   6.8 mmHg LVOT Vmax:      79.00 cm/s LVOT Vmean:     55.300 cm/s LVOT VTI:       0.154 m AI PHT:         508 msec  AORTA Ao Root diam: 2.90 cm MITRAL VALVE MV Area (PHT): 4.77 cm    SHUNTS MV Decel Time: 159 msec    Systemic VTI:  0.15 m MV E velocity: 46.70 cm/s  Systemic Diam: 1.90 cm MV A velocity: 67.70 cm/s MV E/A ratio:  0.69 Shaukat Khan Electronically signed by Neoma Laming Signature Date/Time: 03/24/2022/5:13:51 PM    Final    US Venous Img Lower Bilateral (DVT)  Result Date: 03/24/2022 CLINICAL DATA:  Lower extremity edema EXAM: BILATERAL LOWER EXTREMITY VENOUS DOPPLER ULTRASOUND TECHNIQUE: Gray-scale sonography with graded compression, as well as color Doppler and duplex ultrasound were performed to evaluate the lower extremity deep venous systems from the level of the common femoral vein and including the common femoral, femoral, profunda femoral, popliteal and calf veins including the posterior tibial, peroneal and gastrocnemius veins when visible. Spectral Doppler was utilized to evaluate flow at rest and with distal augmentation maneuvers in the common femoral, femoral and popliteal veins. COMPARISON:  None Available. FINDINGS: RIGHT LOWER EXTREMITY  Common Femoral Vein: No evidence of thrombus. Normal compressibility, respiratory phasicity and response to augmentation. Saphenofemoral Junction: No evidence of thrombus. Normal compressibility and flow on color Doppler imaging. Profunda Femoral  Vein: No evidence of thrombus. Normal compressibility and flow on color Doppler imaging. Femoral Vein: No evidence of thrombus. Normal compressibility, respiratory phasicity and response to augmentation. Popliteal Vein: No evidence of thrombus. Normal compressibility, respiratory phasicity and response to augmentation. Calf Veins: No evidence of thrombus. Normal compressibility and flow on color Doppler imaging. LEFT LOWER EXTREMITY Common Femoral Vein: No evidence of thrombus. Normal compressibility, respiratory phasicity and response to augmentation. Saphenofemoral Junction: No evidence of thrombus. Normal compressibility and flow on color Doppler imaging. Profunda Femoral Vein: No evidence of thrombus. Normal compressibility and flow on color Doppler imaging. Femoral Vein: No evidence of thrombus. Normal compressibility, respiratory phasicity and response to augmentation. Popliteal Vein: No evidence of thrombus. Normal compressibility, respiratory phasicity and response to augmentation. Calf Veins: No evidence of thrombus. Normal compressibility and flow on color Doppler imaging. IMPRESSION: No evidence of deep venous thrombosis in either lower extremity. Electronically Signed   By: Jerilynn Mages.  Shick M.D.   On: 03/24/2022 16:15   CT Angio Chest PE W and/or Wo Contrast  Result Date: 03/23/2022 CLINICAL DATA:  Chest pain, pulmonary embolism suspected EXAM: CT ANGIOGRAPHY CHEST WITH CONTRAST TECHNIQUE: Multidetector CT imaging of the chest was performed using the standard protocol during bolus administration of intravenous contrast. Multiplanar CT image reconstructions and MIPs were obtained to evaluate the vascular anatomy. RADIATION DOSE REDUCTION: This exam was performed according to the departmental dose-optimization program which includes automated exposure control, adjustment of the mA and/or kV according to patient size and/or use of iterative reconstruction technique. CONTRAST:  50mL OMNIPAQUE IOHEXOL 350 MG/ML  SOLN COMPARISON:  02/26/2022 CT chest abdomen pelvis. FINDINGS: Cardiovascular: Satisfactory opacification of the pulmonary arteries to the segmental level. Thrombus is noted in a right lower lobe posterior segment artery (series 5, image 286). Evidence of right heart strain with an RV:LV ratio of 1.13 and some reflux of contrast into the hepatic veins, although the appearance of the right ventricle appears similar to 02/26/2022. Chronic stenosis of some left upper lobe pulmonary arteries (series 5, image 174), related to chronic collapse of the posterior left upper lobe, which also demonstrates radiation fiducials. Normal heart size. Thrombus in the left atrial appendage (series 5, image 196), which is new compared to 02/26/2022. No pericardial effusion. Mediastinum/Nodes: No enlarged mediastinal, hilar, or axillary lymph nodes. Thyroid gland, trachea, and esophagus demonstrate no significant findings. Lungs/Pleura: Redemonstrated innumerable pulmonary nodules, which do seem to have increased in number consistent with metastatic disease. Redemonstrated chronic collapse of a portion of the posterior left upper lobe with radiation fiducials. Upper Abdomen: No acute abnormality. Musculoskeletal: Review of the MIP images confirms the above findings. IMPRESSION: 1. Positive for acute PE in a right lower lobe posterior segment artery with CT evidence of right heart strain (RV/LV Ratio = 1.13) consistent with at least submassive (intermediate risk) PE. The presence of right heart strain has been associated with an increased risk of morbidity and mortality. Please refer to the "Code PE Focused" order set in EPIC. Given the small amount of actual pulmonary thrombus and a similar appearance of the right ventricle on the 02/26/2022 CT, this may be chronic and in part related to stenosis of the left upper lobe pulmonary arteries, secondary to postradiation changes in the left lung. 2. Thrombus in the left atrial appendage,  which is  new compared to 02/26/2022. 3. Innumerable pulmonary nodules, which have increased in number compared to 02/26/2022, consistent with metastatic disease. These results were called by telephone at the time of interpretation on 03/23/2022 at 12:28 pm to provider [facility] , who verbally acknowledged these results. Electronically Signed   By: Merilyn Baba M.D.   On: 03/23/2022 12:31   DG Chest Portable 1 View  Result Date: 03/23/2022 CLINICAL DATA:  Chest pain. EXAM: PORTABLE CHEST 1 VIEW COMPARISON:  Prior chest x-ray on 08/01/2021 and CT of the chest on 02/26/2022 FINDINGS: Normal heart size. Stable emphysematous lung disease and again suggestion of multiple bilateral pulmonary nodules as depicted by recent CT. Stable chronic collapse of the medial left upper lobe. No pneumothorax, overt pulmonary edema or pleural fluid identified. IMPRESSION: Stable emphysematous lung disease, multiple bilateral pulmonary nodules and chronic collapse of the medial left upper lobe. Electronically Signed   By: Aletta Edouard M.D.   On: 03/23/2022 10:40   CT CHEST ABDOMEN PELVIS W CONTRAST  Result Date: 02/27/2022 CLINICAL DATA:  Left upper lobe lung cancer . Bilateral upper back and shoulder blade pain for 10 years. Adenocarcinoma primary. * Tracking Code: BO * EXAM: CT CHEST, ABDOMEN, AND PELVIS WITH CONTRAST TECHNIQUE: Multidetector CT imaging of the chest, abdomen and pelvis was performed following the standard protocol during bolus administration of intravenous contrast. RADIATION DOSE REDUCTION: This exam was performed according to the departmental dose-optimization program which includes automated exposure control, adjustment of the mA and/or kV according to patient size and/or use of iterative reconstruction technique. CONTRAST:  24mL OMNIPAQUE IOHEXOL 300 MG/ML  SOLN COMPARISON:  CT of 11/23/2021 and PET of 08/17/2021. FINDINGS: CT CHEST FINDINGS Cardiovascular: Aortic atherosclerosis. Tortuous thoracic  aorta. Normal heart size, without pericardial effusion. Three vessel coronary artery calcification. No central pulmonary embolism, on this non-dedicated study. Mediastinum/Nodes: No supraclavicular adenopathy. No mediastinal or hilar adenopathy. Contrast level in the esophagus. Lungs/Pleura: Resolved pleural effusions. Chronic collapse of a portion of the posterior left upper lobe with radiation fiducials within. No local recurrence. Innumerable bilateral pulmonary nodules. Index 6 mm left lower lobe sub solid nodule on 64/3 measured 5 mm on the prior exam (when remeasured). Just caudal to this, a sub solid nodule measures 5 mm on 61/3 and is similar to on the prior exam (when remeasured). Left lower lobe cavitary lesion measures 7 mm on 115/3 versus 5 mm on the prior (when remeasured). Two adjacent right lower lobe pulmonary nodules measure maximally 5 mm on 108/3 versus 4 mm on the prior (when remeasured). Musculoskeletal: Multifocal sclerotic osseous metastasis are increased as detailed below. CT ABDOMEN PELVIS FINDINGS Hepatobiliary: Normal liver. Tiny dependent gallstone suspected. No acute cholecystitis or biliary duct dilatation. Pancreas: Normal, without mass or ductal dilatation. Spleen: Normal in size, without focal abnormality. Adrenals/Urinary Tract: Normal adrenal glands. Normal kidneys, without hydronephrosis. Normal urinary bladder. Stomach/Bowel: Normal stomach, without wall thickening. Normal colon, appendix, and terminal ileum. Normal small bowel. Vascular/Lymphatic: Aortic atherosclerosis. No abdominopelvic adenopathy. Reproductive: Mild prostatomegaly. Other: No significant free fluid. No evidence of omental or peritoneal disease. Musculoskeletal: Left iliac sclerotic lesion of 7 mm on 96/2 is enlarged from 3-4 mm on the prior. Increased sclerosis involving the posterior right iliac. Eccentric left L2 vertebral body lesion with increased superior endplate irregularity including on 86/6. An  anterior L3 sclerotic lesion of 7 mm on sagittal image 75 is increased from 5 mm on the prior exam (when remeasured). S shaped thoracolumbar spine curvature. IMPRESSION: CT CHEST IMPRESSION  1. Mild progression of diffuse pulmonary nodules indicative of metastasis. 2. Chronic left upper lobe therapy induced partial collapse, without local recurrence. 3. No thoracic adenopathy. 4. Resolved bilateral pleural effusions. 5. Esophageal air fluid level suggests dysmotility or gastroesophageal reflux. 6. Coronary artery atherosclerosis. Aortic Atherosclerosis (ICD10-I70.0). CT ABDOMEN AND PELVIS IMPRESSION 1. No soft tissue metastasis within the abdomen or pelvis. 2. Progressive osseous metastasis. 3. Probable cholelithiasis. Electronically Signed   By: Abigail Miyamoto M.D.   On: 02/27/2022 13:44     88 y.o. male patient  with multiple medical problems including CAD/A-fib on Eliquis with stage IV lung cancer currently admitted to hospital for acute on chronic shortness of breath/chest pain.  CTA showed-submassive right acute PE.    #Metastatic/stage IV adenocarcinoma of the lung currently on Keytruda.   #Acute PE submassive right-most likely cause is underlying malignancy/megace.  Currently on IV heparin.  Clinically improved.    Recommendations/plan:  #With regards to lung cancer-hold any therapies given patient's acute illness/admission.  However given progression noted in the lung/bone patient will need evaluation with Dr. Grayland Ormond regarding consideration of second line therapies.  #With regards to anticoagulation-as patient had PE while on anticoagulation-I would recommend Lovenox 1 mg/kg subcu twice daily for at least 6 weeks to 3 months.  Thereafter could be switched over to alternate anticoagulation like Pradaxa.  Coumadin is an option however-monitoring of blood levels would be a challenge.  However this could be discussed on outpatient basis with primary oncologist Dr. Grayland Ormond.  #Patient/family was  reluctant with regards to switching over to Lovenox.  Patient stated injection site pain in the past.   #Reviewed the recommendations of the primary team.  I also discussed with Dr. Clayborn Bigness, the patient's cardiologist regarding recommendations.    Thank you Dr. Manuella Ghazi  for allowing me to participate in the care of your pleasant patient. Please do not hesitate to contact me with questions or concerns in the interim.    # I reviewed the blood work- with the patient in detail; also reviewed the imaging independently [as summarized above]; and with the patient in detail. The above plan of care was discussed with the patient.  I spoke to patient's  family and discussed/updated the above plan of care.      Cammie Sickle, MD 03/24/2022

## 2022-03-24 NOTE — Progress Notes (Addendum)
  Progress Note   Patient: Scott Gross [id number]:[id number] DOB: 1934/05/13 DOA: 03/23/2022     0 DOS: the patient was seen and examined on 03/24/2022   Brief hospital course: 88 y.o. male with medical history significant of CAD status post tenting, hyperlipidemia, hypertension, left upper lobe cancer, diabetes, atrial fibrillation, cardiac arrest, CHF admitted for acute PE  6/10: Continue heparin drip per oncology     Assessment and Plan: * Acute pulmonary embolus (HCC) Continue heparin drip for now.  Oncology consult.  Will obtain echo and lower extremity Dopplers to rule out DVT With his history of cancer and use of Megace he is high risk for blood clots He was on Eliquis for A-fib which is switched to heparin for now.  We will let oncology decide on long-term anticoagulation plan  Chronic systolic CHF (congestive heart failure) ([place]) Well compensated at this time.  Continue losartan.  EF 45 to 50% on last echo  Protein-calorie malnutrition, severe Overall poor p.o. intake.  Encouraged oral nutrition BMI 17.8  AF (paroxysmal atrial fibrillation) (HCC) Rate controlled with amiodarone.  Heparin drip for anticoagulation, was on Eliquis at home which is on hold  Diabetes mellitus type 2, uncomplicated (HCC) Sliding scale for now.  A1c 6.2  Cancer of upper lobe of left lung (HCC) Followed by Dr. Rogue Bussing  Hypertension Continue Cozaar, HCTZ and adjust blood pressure medicine as need  Hyperlipidemia Continue Crestor  CAD (coronary artery disease) Continue Crestor, Cozaar, HCTZ, amiodarone and heparin drip.  Per family patient's Plavix was stopped about 2 weeks ago by his cardiology Dr. Clayborn Bigness        Subjective: Feeling tired, no further chest pain, sitting in the chair.  Family at bedside with multiple questions  Physical Exam: Vitals:   03/23/22 2310 03/24/22 0406 03/24/22 0610 03/24/22 0815  BP: 131/74 (!) 153/78 (!) 158/86 (!) 156/81  Pulse: 76 80 82 80  Resp: 20  20 20 17   Temp: 97.8 F (36.6 C) 98.7 F (37.1 C)  98.5 F (36.9 C)  TempSrc:    Oral  SpO2: 97% 97% 99% 99%  Weight:      Height:       88 year old cachectic looking male sitting in the chair comfortably without any acute distress Lungs clear to auscultation bilaterally Cardiovascular regular rate and rhythm Abdomen soft, benign Neuro alert and oriented, nonfocal Skin no rash or lesion Psych normal mood and affect  Data Reviewed:  CT chest showing subacute/chronic PE and Thrombus in the left atrial appendage  Family Communication: Family/wife and her husband were updated at bedside  Disposition: Status is: Inpatient Remains inpatient appropriate because: Treatment of PE   Planned Discharge Destination: Home with Home Health    DVT prophylaxis-heparin drip Time spent: 35 minutes  Author: Max Sane, MD 03/24/2022 11:59 AM  For on call review [email or web address].

## 2022-03-24 NOTE — Assessment & Plan Note (Signed)
Rate controlled with amiodarone.  Heparin drip for anticoagulation, was on Eliquis at home which is on hold

## 2022-03-25 DIAGNOSIS — I513 Intracardiac thrombosis, not elsewhere classified: Secondary | ICD-10-CM | POA: Diagnosis not present

## 2022-03-25 DIAGNOSIS — I480 Paroxysmal atrial fibrillation: Secondary | ICD-10-CM | POA: Diagnosis not present

## 2022-03-25 DIAGNOSIS — I5022 Chronic systolic (congestive) heart failure: Secondary | ICD-10-CM | POA: Diagnosis not present

## 2022-03-25 DIAGNOSIS — I2699 Other pulmonary embolism without acute cor pulmonale: Secondary | ICD-10-CM | POA: Diagnosis not present

## 2022-03-25 LAB — HEPARIN LEVEL (UNFRACTIONATED): Heparin Unfractionated: 0.43 IU/mL (ref 0.30–0.70)

## 2022-03-25 LAB — GLUCOSE, CAPILLARY
Glucose-Capillary: 123 mg/dL — ABNORMAL HIGH (ref 70–99)
Glucose-Capillary: 133 mg/dL — ABNORMAL HIGH (ref 70–99)
Glucose-Capillary: 120 mg/dL — ABNORMAL HIGH (ref 70–99)
Glucose-Capillary: 166 mg/dL — ABNORMAL HIGH (ref 70–99)

## 2022-03-25 LAB — CBC
HCT: 41.9 % (ref 39.0–52.0)
Hemoglobin: 13.9 g/dL (ref 13.0–17.0)
MCH: 26.8 pg (ref 26.0–34.0)
MCHC: 33.2 g/dL (ref 30.0–36.0)
MCV: 80.7 fL (ref 80.0–100.0)
Platelets: 233 10*3/uL (ref 150–400)
RBC: 5.19 MIL/uL (ref 4.22–5.81)
RDW: 15 % (ref 11.5–15.5)
WBC: 12.4 10*3/uL — ABNORMAL HIGH (ref 4.0–10.5)
nRBC: 0 % (ref 0.0–0.2)

## 2022-03-25 MED ORDER — ENOXAPARIN SODIUM 100 MG/ML IJ SOSY
1.5000 mg/kg | PREFILLED_SYRINGE | INTRAMUSCULAR | Status: DC
  Administered 2022-03-25: 87.5 mg via SUBCUTANEOUS
  Filled 2022-03-25: qty 0.88

## 2022-03-25 NOTE — Progress Notes (Signed)
[place] for transition from heparin to enoxaparin  Indication: pulmonary embolus  Patient Measurements: Height: 5\' 11"  (180.3 cm) Weight: 58.1 kg (128 lb 1 oz) IBW/kg (Calculated) : 75.3 Heparin Dosing Weight: 58.1 kg  Vital Signs: Temp: 98.6 F (37 C) (06/11 1509) Temp Source: Oral (06/11 0410) BP: 121/76 (06/11 1509) Pulse Rate: 80 (06/11 1509)  Labs: Recent Labs    03/23/22 1005 03/23/22 1217 03/23/22 1330 03/23/22 1330 03/23/22 2205 03/24/22 0749 03/24/22 1703 03/25/22 0550  HGB 15.6  --   --   --   --  13.9  --  13.9  HCT 47.4  --   --   --   --  41.3  --  41.9  PLT 235  --   --   --   --  230  --  233  APTT  --   --  27  --  125* 86*  --   --   LABPROT  --   --  14.8  --   --   --   --   --   INR  --   --  1.2  --   --   --   --   --   HEPARINUNFRC  --   --  0.78*   < >  --  0.63 0.51 0.43  CREATININE 0.79  --   --   --   --  0.76  --   --   TROPONINIHS 26* 28*  --   --   --   --   --   --    < > = values in this interval not displayed.     Estimated Creatinine Clearance: 55.5 mL/min (by C-G formula based on SCr of 0.76 mg/dL).   Medical History: Past Medical History:  Diagnosis Date   Arthritis    CAD (coronary artery disease) Nov. 12, 2012   Inferior ST elevation MI in November of 2012 with ventricular fibrillation arrest. Status post drug-eluting stent placement to the mid LAD. Most recent cardiac catheterization in June of this year showed patent stent with minimal restenosis, 75% proximal LAD and 80% distal LAD which were unchanged from before. Mild disease in the left circumflex and moderate RCA disease. Ejection fraction was 55%.   Cancer of upper lobe of left lung ([place]) 11/2016   Rad tx's.   Cardiac arrest [facility]) Nov. 2012   x 2    Cardiac arrest - ventricular fibrillation    HOH (hard of hearing)    Left Hearing Aid   Hyperlipidemia    Hypertension    Post PTCA 2013   x2   ST elevation MI (STEMI) ([place])  08/26/2011    Medications:  Apixaban 5mg  bid prior to admission, last dose on 03/22/22 in the AM.  Assessment: Patient is a 88yo male presented with chest pain and found to have a small PE. Pharmacy is consulted for transition from heparin to enoxaparin.  Goal of Therapy:  Monitor platelets by anticoagulation protocol: Yes  Plan:  Stop heparin infusion Start enoxaparin 1.5 mg/kg subcutaneously once daily (once daily by request of MD for ease of administration) CBC in am  Dallie Piles 03/25/2022 3:14 PM

## 2022-03-25 NOTE — Assessment & Plan Note (Signed)
Noted on CT on admission.  Not mention echo although had cardiology review echo personally and very difficult to see if at all.  Regardless, given patient's stable hemodynamic status, no additional change in treatment other than to continue anticoagulation which should address this as well as pulmonary embolus

## 2022-03-25 NOTE — Progress Notes (Signed)
ANTICOAGULATION CONSULT NOTE -   Pharmacy Consult for Heparin Drip Indication: pulmonary embolus  Allergies  Allergen Reactions   Statins Other (See Comments)    Arthralagia    Patient Measurements: Height: 5\' 11"  (180.3 cm) Weight: 58.1 kg (128 lb 1.4 oz) IBW/kg (Calculated) : 75.3 Heparin Dosing Weight: 58.1 kg  Vital Signs: Temp: 98.9 F (37.2 C) (06/11 0410) Temp Source: Oral (06/11 0410) BP: 125/82 (06/11 0410) Pulse Rate: 88 (06/11 0410)  Labs: Recent Labs    03/23/22 1005 03/23/22 1217 03/23/22 1330 03/23/22 1330 03/23/22 2205 03/24/22 0749 03/24/22 1703 03/25/22 0550  HGB 15.6  --   --   --   --  13.9  --  13.9  HCT 47.4  --   --   --   --  41.3  --  41.9  PLT 235  --   --   --   --  230  --  233  APTT  --   --  27  --  125* 86*  --   --   LABPROT  --   --  14.8  --   --   --   --   --   INR  --   --  1.2  --   --   --   --   --   HEPARINUNFRC  --   --  0.78*   < >  --  0.63 0.51 0.43  CREATININE 0.79  --   --   --   --  0.76  --   --   TROPONINIHS 26* 28*  --   --   --   --   --   --    < > = values in this interval not displayed.     Estimated Creatinine Clearance: 55.5 mL/min (by C-G formula based on SCr of 0.76 mg/dL).   Medical History: Past Medical History:  Diagnosis Date   Arthritis    CAD (coronary artery disease) Nov. 12, 2012   Inferior ST elevation MI in November of 2012 with ventricular fibrillation arrest. Status post drug-eluting stent placement to the mid LAD. Most recent cardiac catheterization in June of this year showed patent stent with minimal restenosis, 75% proximal LAD and 80% distal LAD which were unchanged from before. Mild disease in the left circumflex and moderate RCA disease. Ejection fraction was 55%.   Cancer of upper lobe of left lung ([place]) 11/2016   Rad tx's.   Cardiac arrest [facility]) Nov. 2012   x 2    Cardiac arrest - ventricular fibrillation    HOH (hard of hearing)    Left Hearing Aid   Hyperlipidemia     Hypertension    Post PTCA 2013   x2   ST elevation MI (STEMI) ([place]) 08/26/2011    Medications:  Apixaban 5mg  bid prior to admission, last dose on 03/22/22 in the AM.  Assessment: Patient is a 88yo male presented with chest pain and found to have a small PE. Pharmacy is consulted for Heparin dosing.  Baseline Labs: HL 0.78, others within range  Goal of Therapy:  Heparin level 0.3-0.7 units/ml aPTT 66-102 seconds Monitor platelets by anticoagulation protocol: Yes   Results: 6/9: APTT @ 2205 = 125,  supratherapeutic   decrease to 900 units/hr  6/10: APTT @ 0749= 86 therapeutic  (HL=0.63 therapeutic, now correlating)   continue current rate 6/10: HL @ 1703 = 0.51, therapeutic x 2 @ 900 units/hr 6/11: HL @ 2774 =  0.43, therapeutic X 3 @ 900 units/hr  Plan:  6/11:  HL @ 0550 = 0.43, therapeutic X 3 Will continue pt on current rate and recheck HL on 6/12 with AM labs.   Juleah Paradise D 03/25/2022 6:35 AM

## 2022-03-25 NOTE — Assessment & Plan Note (Signed)
Well compensated at this time.  Continue losartan.  EF 45 to 50% on last echo

## 2022-03-25 NOTE — Assessment & Plan Note (Addendum)
Hemodynamically stable.  Currently on heparin drip but as per oncology recommendations, will switch over to Lovenox.  Echocardiogram unrevealing.  No risk of cardiovascular compromise.  Patient is not hypoxic.  Failed outpatient Eliquis although this may be in part due to combination of cancer and use of Megace.  Will need Lovenox at least for 6 weeks and patient can follow-up with his oncologist to determine if alternative oral medication even such as Coumadin can be used.  We will set up education for patient and wife for administration as well as home health

## 2022-03-25 NOTE — Assessment & Plan Note (Signed)
Rate controlled with amiodarone.  Heparin drip for anticoagulation, was on Eliquis at home which is on hold.  Changing over to Lovenox

## 2022-03-25 NOTE — Assessment & Plan Note (Signed)
Sliding scale for now.  A1c 6.2

## 2022-03-25 NOTE — Assessment & Plan Note (Signed)
Overall poor p.o. intake.  Encouraged oral nutrition BMI 17.8.  Nutrition to see

## 2022-03-25 NOTE — Progress Notes (Signed)
[facility] Progress Note  Patient: MINDY BEHNKEN    JTT:[id number]  DOA: 03/23/2022    Date of Service: the patient was seen and examined on 03/25/2022  Brief hospital course: Patient is an 88 year old male with past medical history of CAD status post stenting, left upper lobe lung cancer, atrial fibrillation and systolic/diastolic heart failure admitted on 6/9 after presenting with chest pain and found to have an acute pulmonary embolus despite Eliquis.  In addition, CT scan chest also noted left atrial thrombus.  Patient admitted to the hospitalist service and started on heparin.  Patient seen by oncology who recommended patient be switched over from Eliquis to Lovenox full dosing.   Assessment and Plan: Assessment and Plan: * Acute pulmonary embolus ([place]) Hemodynamically stable.  Currently on heparin drip but as per oncology recommendations, will switch over to Lovenox.  Echocardiogram unrevealing.  No risk of cardiovascular compromise.  Patient is not hypoxic.  Failed outpatient Eliquis although this may be in part due to combination of cancer and use of Megace.  Will need Lovenox at least for 6 weeks and patient can follow-up with his oncologist to determine if alternative oral medication even such as Coumadin can be used.  We will set up education for patient and wife for administration as well as home health  Atrial thrombus Noted on CT on admission.  Not mention echo although had cardiology review echo personally and very difficult to see if at all.  Regardless, given patient's stable hemodynamic status, no additional change in treatment other than to continue anticoagulation which should address this as well as pulmonary embolus  AF (paroxysmal atrial fibrillation) (HCC) Rate controlled with amiodarone.  Heparin drip for anticoagulation, was on Eliquis at home which is on hold.  Changing over to Lovenox  Diabetes mellitus type 2, uncomplicated (HCC) Sliding scale for now.  A1c  6.2  Cancer of upper lobe of left lung (HCC) Followed by Dr. Rogue Bussing  Chronic systolic CHF (congestive heart failure) ([place]) Well compensated at this time.  Continue losartan.  EF 45 to 50% on last echo  Hypertension Continue Cozaar, HCTZ and adjust blood pressure medicine as need  Protein-calorie malnutrition, severe Overall poor p.o. intake.  Encouraged oral nutrition BMI 17.8.  Nutrition to see    CAD (coronary artery disease) Continue Crestor, Cozaar, HCTZ, amiodarone and heparin drip.  Per family patient's Plavix was stopped about 2 weeks ago by his cardiology Dr. Clayborn Bigness  Hyperlipidemia Continue Crestor       Body mass index is 17.86 kg/m.        Consultants: Case discussed with cardiology Oncology  Procedures: Echocardiogram noting preserved ejection fraction, no hemodynamic compromise  Antimicrobials: None  Code Status: Full code   Subjective: Patient doing okay with no complaints.  No chest pain or shortness of breath  Objective: Vital signs were reviewed and unremarkable. Vitals:   03/25/22 0815 03/25/22 1151  BP:  123/84  Pulse:  86  Resp: 17 18  Temp:  98.5 F (36.9 C)  SpO2:  98%    Intake/Output Summary (Last 24 hours) at 03/25/2022 1502 Last data filed at 03/25/2022 1400 Gross per 24 hour  Intake 495.92 ml  Output --  Net 495.92 ml   Filed Weights   03/23/22 1000 03/25/22 1151  Weight: 58.1 kg 58.1 kg   Body mass index is 17.86 kg/m.  Exam:  General: Alert and oriented x3, no acute distress HEENT: Normocephalic, atraumatic, mucous membranes are moist Cardiovascular: Regular rate and rhythm,  S1-S2 Respiratory: Clear to auscultation bilaterally Abdomen: Soft, nontender, nondistended, positive bowel sounds Musculoskeletal: No clubbing or cyanosis or edema Skin: No skin breaks, tears or lesions Psychiatry: Appropriate, no evidence of psychoses Neurology: No focal deficits  Data Reviewed: White blood cell count of  12.4  Disposition:  Status is: Inpatient Remains inpatient appropriate because: Changing to Lovenox, education    Anticipated discharge date: 6/12  Family Communication: Wife at the bedside DVT Prophylaxis:   Heparin being transitioned to Lovenox    Author: Annita Brod ,MD 03/25/2022 3:02 PM  To reach On-call, see care teams to locate the attending and reach out via [email or web address]. Between 7PM-7AM, please contact night-coverage If you still have difficulty reaching the attending provider, please page the [facility] (Director on Call) for [facility] on amion for assistance.

## 2022-03-25 NOTE — Assessment & Plan Note (Signed)
Followed by Dr. Rogue Bussing

## 2022-03-26 DIAGNOSIS — I480 Paroxysmal atrial fibrillation: Secondary | ICD-10-CM | POA: Diagnosis not present

## 2022-03-26 DIAGNOSIS — I2699 Other pulmonary embolism without acute cor pulmonale: Secondary | ICD-10-CM | POA: Diagnosis not present

## 2022-03-26 DIAGNOSIS — I513 Intracardiac thrombosis, not elsewhere classified: Secondary | ICD-10-CM | POA: Diagnosis not present

## 2022-03-26 DIAGNOSIS — I2510 Atherosclerotic heart disease of native coronary artery without angina pectoris: Secondary | ICD-10-CM | POA: Diagnosis not present

## 2022-03-26 LAB — CBC
HCT: 41.3 % (ref 39.0–52.0)
Hemoglobin: 13.9 g/dL (ref 13.0–17.0)
MCH: 27.3 pg (ref 26.0–34.0)
MCHC: 33.7 g/dL (ref 30.0–36.0)
MCV: 81 fL (ref 80.0–100.0)
Platelets: 248 10*3/uL (ref 150–400)
RBC: 5.1 MIL/uL (ref 4.22–5.81)
RDW: 15.2 % (ref 11.5–15.5)
WBC: 13.9 10*3/uL — ABNORMAL HIGH (ref 4.0–10.5)
nRBC: 0 % (ref 0.0–0.2)

## 2022-03-26 LAB — GLUCOSE, CAPILLARY: Glucose-Capillary: 133 mg/dL — ABNORMAL HIGH (ref 70–99)

## 2022-03-26 MED ORDER — ENOXAPARIN SODIUM 100 MG/ML IJ SOSY: 1.5000 mg/kg | PREFILLED_SYRINGE | INTRAMUSCULAR | 0 refills | Status: DC

## 2022-03-26 MED ORDER — CLOTRIMAZOLE-BETAMETHASONE 1-0.05 % EX CREA: TOPICAL_CREAM | CUTANEOUS | 1 refills | Status: DC

## 2022-03-26 NOTE — TOC Initial Note (Signed)
Transition of Care [facility]) - Initial/Assessment Note    Patient Details  Name: Scott Gross MRN: [id number] Date of Birth: 08/21/1934  Transition of Care [facility]) CM/SW Contact:    Laurena Slimmer, RN Phone Number: 03/26/2022, 9:43 AM  Clinical Narrative:                  Transition of Care [facility]) Screening Note   Patient Details  Name: Scott Gross Date of Birth: [birth year]   Transition of Care [facility]) CM/SW Contact:    Laurena Slimmer, RN Phone Number: 03/26/2022, 9:43 AM    Transition of Care Department [facility]) has reviewed patient and no TOC needs have been identified at this time. We will continue to monitor patient advancement through interdisciplinary progression rounds. If new patient transition needs arise, please place a TOC consult.          Patient Goals and CMS Choice        Expected Discharge Plan and Services           Expected Discharge Date: 03/26/22                                    Prior Living Arrangements/Services                       Activities of Daily Living Home Assistive Devices/Equipment: None ADL Screening (condition at time of admission) Patient's cognitive ability adequate to safely complete daily activities?: Yes Is the patient deaf or have difficulty hearing?: Yes Does the patient have difficulty seeing, even when wearing glasses/contacts?: No Does the patient have difficulty concentrating, remembering, or making decisions?: No Patient able to express need for assistance with ADLs?: Yes Does the patient have difficulty dressing or bathing?: No Independently performs ADLs?: Yes (appropriate for developmental age) Does the patient have difficulty walking or climbing stairs?: No Weakness of Legs: Both Weakness of Arms/Hands: None  Permission Sought/Granted                  Emotional Assessment              Admission diagnosis:  Acute pulmonary embolus ([place]) [I26.99] Other acute pulmonary embolism  without acute cor pulmonale (HCC) [I26.99] Pulmonary embolism ([place]) [I26.99] Patient Active Problem List   Diagnosis Date Noted   Atrial thrombus 03/25/2022   Pulmonary embolism ([place]) 03/24/2022   Acute pulmonary embolus ([place]) 03/23/2022   Pleural effusion    Chronic systolic CHF (congestive heart failure) ([place]) 08/01/2021   Acute respiratory failure ([place]) 08/01/2021   Hypokalemia 08/01/2021   Pulmonary nodules 08/01/2021   A-fib ([place]) 06/09/2021   Atrial fibrillation with rapid ventricular response ([place]) 06/08/2021   Acute ST elevation myocardial infarction (STEMI) of lateral wall ([place]) 06/06/2021   Chronic anticoagulation 06/06/2021   Protein-calorie malnutrition, severe 06/06/2021   Malnutrition of moderate degree 06/22/2019   CAP (community acquired pneumonia) 06/21/2019   SIRS (systemic inflammatory response syndrome) ([place]) 05/23/2019   Atrial fibrillation with RVR (HCC)    AF (paroxysmal atrial fibrillation) ([place]) 05/20/2019   Cardiac arrest ([place])    Diabetes mellitus type 2, uncomplicated ([place]) 97/11/6376   Angina pectoris ([place]) 12/17/2016   Adenopathy    Cancer of upper lobe of left lung ([place]) 09/10/2016   Hyperlipidemia    Hypertension    CAD (coronary artery disease) 08/27/2011   Myocardial infarction ([place]) 08/16/2011   PCP:  [name], MD Pharmacy:   [facility] - [place], [place] [address] Phone: [phone] Fax: [phone]  [id number] - [place], [place] - [address] [place] [address] Phone: [phone] Fax: [phone]     Social Determinants of Health (SDOH) Interventions    Readmission Risk Interventions     No data to display

## 2022-03-26 NOTE — Progress Notes (Signed)
[facility]) Hospital Liaison note:  Notified via [place] from Dr. Max Sane of request for [place] services. Will continue to follow for disposition.  Please call with any outpatient palliative questions or concerns.  Thank you for the opportunity to participate in this patient's care.  Thank you, Lorelee Market, LPN [facility] Liaison [phone]

## 2022-03-27 NOTE — Progress Notes (Signed)
[facility]  [phone] Fax:(336) [phone]  ID: Scott Gross OB: 01/24/1934  MR#: [id number]  OHY#:[id number]  Patient Care Team: [name], MD as PCP - General (Family Medicine) [name], MD as Consulting Physician (Oncology)   CHIEF COMPLAINT: Progressive adenocarcinoma of upper lobe of left lung.  INTERVAL HISTORY: Patient returns to clinic today for further evaluation, hospital follow-up, and consideration of cycle 6 of single agent Keytruda.  He was recently admitted with increasing chest pain and found to have submassive pulmonary embolus.  Patient continues to have weakness and fatigue, but otherwise feels well and is nearly back to his baseline.  He does not complain of back pain today.  He continues to have a poor appetite.  He has no neurologic complaints. He denies any chest pain, shortness of breath, cough, or hemoptysis.  He has no nausea, vomiting, constipation, or diarrhea. He has no urinary complaints.  Patient offers no further specific complaints today.  REVIEW OF SYSTEMS:   Review of Systems  Constitutional:  Positive for malaise/fatigue. Negative for fever and weight loss.  Respiratory: Negative.  Negative for cough, hemoptysis and shortness of breath.   Cardiovascular: Negative.  Negative for chest pain and leg swelling.  Gastrointestinal: Negative.  Negative for abdominal pain.  Genitourinary: Negative.  Negative for dysuria.  Musculoskeletal: Negative.  Negative for back pain and joint pain.  Skin: Negative.  Negative for rash.  Neurological:  Positive for weakness. Negative for sensory change, focal weakness and headaches.  Psychiatric/Behavioral: Negative.  The patient is not nervous/anxious.     As per HPI. Otherwise, a complete review of systems is negative.  PAST MEDICAL HISTORY: Past Medical History:  Diagnosis Date   Arthritis    CAD (coronary artery disease) Nov. 12, 2012   Inferior ST elevation MI in  November of 2012 with ventricular fibrillation arrest. Status post drug-eluting stent placement to the mid LAD. Most recent cardiac catheterization in June of this year showed patent stent with minimal restenosis, 75% proximal LAD and 80% distal LAD which were unchanged from before. Mild disease in the left circumflex and moderate RCA disease. Ejection fraction was 55%.   Cancer of upper lobe of left lung ([place]) 11/2016   Rad tx's.   Cardiac arrest [facility]) Nov. 2012   x 2    Cardiac arrest - ventricular fibrillation    HOH (hard of hearing)    Left Hearing Aid   Hyperlipidemia    Hypertension    Post PTCA 2013   x2   ST elevation MI (STEMI) ([place]) 08/26/2011    PAST SURGICAL HISTORY: Past Surgical History:  Procedure Laterality Date   CARDIAC CATHETERIZATION  2013   @ [place]; [place] s/p stent    CORONARY ANGIOPLASTY     CORONARY/GRAFT ACUTE MI REVASCULARIZATION N/A 06/06/2021   Procedure: Coronary/Graft Acute MI Revascularization;  Surgeon: Wellington Hampshire, MD;  Location: [place] [facility];  Service: Cardiovascular;  Laterality: N/A;   ELECTROMAGNETIC NAVIGATION BROCHOSCOPY N/A 11/27/2016   Procedure: ELECTROMAGNETIC NAVIGATION BRONCHOSCOPY;  Surgeon: [name], MD;  Location: [facility];  Service: Cardiopulmonary;  Laterality: N/A;   LEFT HEART CATH AND CORONARY ANGIOGRAPHY N/A 06/06/2021   Procedure: LEFT HEART CATH AND CORONARY ANGIOGRAPHY;  Surgeon: Wellington Hampshire, MD;  Location: [place] [facility];  Service: Cardiovascular;  Laterality: N/A;    FAMILY HISTORY: Family History  Problem Relation Age of Onset   Heart attack Mother    Heart attack Father  ADVANCED DIRECTIVES (Y/N):  N  HEALTH MAINTENANCE: Social History   Tobacco Use   Smoking status: Former    Packs/day: 1.00    Types: Cigarettes    Quit date: 08/27/2011    Years since quitting: 10.5   Smokeless tobacco: Never  Vaping Use   Vaping Use: Never used  Substance Use Topics   Alcohol use: No    Drug use: No     Colonoscopy:  PAP:  Bone density:  Lipid panel:  Allergies  Allergen Reactions   Statins Other (See Comments)    Arthralagia    Current Outpatient Medications  Medication Sig Dispense Refill   albuterol (PROVENTIL) (2.5 MG/3ML) 0.083% nebulizer solution SMARTSIG:3 Milliliter(s) Via Nebulizer Every 6 Hours PRN     albuterol (VENTOLIN HFA) 108 (90 Base) MCG/ACT inhaler Inhale into the lungs every 6 (six) hours as needed.     amiodarone (PACERONE) 200 MG tablet Take 1 tablet (200 mg total) by mouth 2 (two) times daily. (Patient taking differently: Take 100 mg by mouth daily.) 60 tablet 0   amLODipine (NORVASC) 5 MG tablet Take by mouth.     enoxaparin (LOVENOX) 100 MG/ML injection Inject 0.975 mLs (97.5 mg total) into the skin daily. 29.25 mL 0   guaiFENesin (MUCINEX) 600 MG 12 hr tablet Take 600 mg by mouth 2 (two) times daily as needed for cough or to loosen phlegm.     losartan-hydrochlorothiazide (HYZAAR) 50-12.5 MG tablet Take 1 tablet by mouth daily.     naproxen sodium (ALEVE) 220 MG tablet Take 220 mg by mouth 2 (two) times daily with a meal.     potassium chloride SA (KLOR-CON M) 20 MEQ tablet Take 1 tablet (20 mEq total) by mouth daily. 30 tablet 3   rosuvastatin (CRESTOR) 5 MG tablet Take 2.5 mg by mouth daily.     torsemide (DEMADEX) 20 MG tablet Take 1 tablet (20 mg total) by mouth daily. 30 tablet 0   traMADol (ULTRAM) 50 MG tablet Take 1 tablet (50 mg total) by mouth 2 (two) times daily. As needed for pain. 60 tablet 0   clotrimazole-betamethasone (LOTRISONE) cream Apply to affected area 2 times daily 15 g 1   No current facility-administered medications for this visit.    OBJECTIVE: Vitals:   03/29/22 1421  BP: 119/75  Pulse: 91  Resp: 16  Temp: (!) 96.4 F (35.8 C)  SpO2: 98%     Body mass index is 17.41 kg/m.    ECOG FS:0 - Asymptomatic  General: Thin, no acute distress. Eyes: Pink conjunctiva, anicteric sclera. HEENT: Normocephalic,  moist mucous membranes. Lungs: No audible wheezing or coughing. Heart: Regular rate and rhythm. Abdomen: Soft, nontender, no obvious distention. Musculoskeletal: No edema, cyanosis, or clubbing. Neuro: Alert, answering all questions appropriately. Cranial nerves grossly intact. Skin: No rashes or petechiae noted. Psych: Normal affect.   LAB RESULTS:  Lab Results  Component Value Date   NA 132 (L) 03/24/2022   K 4.6 03/24/2022   CL 100 03/24/2022   CO2 25 03/24/2022   GLUCOSE 131 (H) 03/24/2022   BUN 12 03/24/2022   CREATININE 0.76 03/24/2022   CALCIUM 8.4 (L) 03/24/2022   PROT 6.8 03/24/2022   ALBUMIN 3.6 03/24/2022   AST 32 03/24/2022   ALT 26 03/24/2022   ALKPHOS 393 (H) 03/24/2022   BILITOT 0.7 03/24/2022   GFRNONAA >60 03/24/2022   GFRAA >60 03/15/2020    Lab Results  Component Value Date   WBC 13.9 (H) 03/26/2022  NEUTROABS 9.3 (H) 03/08/2022   HGB 13.9 03/26/2022   HCT 41.3 03/26/2022   MCV 81.0 03/26/2022   PLT 248 03/26/2022     STUDIES: ECHOCARDIOGRAM COMPLETE  Result Date: 03/24/2022    ECHOCARDIOGRAM REPORT   Patient Name:   Scott Gross Date of Exam: 03/24/2022 Medical Rec #:  [id number]       Height:       71.0 in Accession #:    [id number]      Weight:       128.1 lb Date of Birth:  1934-10-02       BSA:          1.745 m Patient Age:    49 years        BP:           156/81 mmHg Patient Gender: M               HR:           82 bpm. Exam Location:  [facility] Procedure: 2D Echo Indications:     Pulmonary Embolus I26.09  History:         Patient has prior history of Echocardiogram examinations, most                  recent 06/06/2021.  Sonographer:     Kathlen Brunswick RDCS Referring Phys:  [place] Diagnosing Phys: [place]  1. Left ventricular ejection fraction, by estimation, is 45 to 50%. The left ventricle has mildly decreased function. The left ventricle demonstrates global hypokinesis. There is mild concentric left ventricular  hypertrophy. Left ventricular diastolic parameters are consistent with Grade I diastolic dysfunction (impaired relaxation).  2. Right ventricular systolic function is normal. The right ventricular size is normal.  3. Left atrial size was mildly dilated.  4. Right atrial size was mildly dilated.  5. The mitral valve is normal in structure. Trivial mitral valve regurgitation. No evidence of mitral stenosis.  6. The aortic valve is normal in structure. Aortic valve regurgitation is mild to moderate. Aortic valve sclerosis is present, with no evidence of aortic valve stenosis.  7. The inferior vena cava is normal in size with greater than 50% respiratory variability, suggesting right atrial pressure of 3 mmHg. FINDINGS  Left Ventricle: Left ventricular ejection fraction, by estimation, is 45 to 50%. The left ventricle has mildly decreased function. The left ventricle demonstrates global hypokinesis. The left ventricular internal cavity size was normal in size. There is  mild concentric left ventricular hypertrophy. Left ventricular diastolic parameters are consistent with Grade I diastolic dysfunction (impaired relaxation). Right Ventricle: The right ventricular size is normal. No increase in right ventricular wall thickness. Right ventricular systolic function is normal. Left Atrium: Left atrial size was mildly dilated. Right Atrium: Right atrial size was mildly dilated. Pericardium: There is no evidence of pericardial effusion. Mitral Valve: The mitral valve is normal in structure. Trivial mitral valve regurgitation. No evidence of mitral valve stenosis. Tricuspid Valve: The tricuspid valve is normal in structure. Tricuspid valve regurgitation is trivial. No evidence of tricuspid stenosis. Aortic Valve: The aortic valve is normal in structure. Aortic valve regurgitation is mild to moderate. Aortic regurgitation PHT measures 508 msec. Aortic valve sclerosis is present, with no evidence of aortic valve stenosis. Aortic  valve peak gradient measures 6.8 mmHg. Pulmonic Valve: The pulmonic valve was normal in structure. Pulmonic valve regurgitation is trivial. No evidence of pulmonic stenosis. Aorta: The aortic root is normal in size  and structure. Venous: The inferior vena cava is normal in size with greater than 50% respiratory variability, suggesting right atrial pressure of 3 mmHg. IAS/Shunts: No atrial level shunt detected by color flow Doppler.  LEFT VENTRICLE PLAX 2D LVIDd:         3.56 cm     Diastology LVIDs:         2.77 cm     LV e' medial:    3.81 cm/s LV PW:         1.00 cm     LV E/e' medial:  12.3 LV IVS:        0.78 cm     LV e' lateral:   3.92 cm/s LVOT diam:     1.90 cm     LV E/e' lateral: 11.9 LV SV:         44 LV SV Index:   25 LVOT Area:     2.84 cm  LV Volumes (MOD) LV vol d, MOD A2C: 39.6 ml LV vol d, MOD A4C: 84.2 ml LV vol s, MOD A2C: 22.0 ml LV vol s, MOD A4C: 42.0 ml LV SV MOD A2C:     17.6 ml LV SV MOD A4C:     84.2 ml LV SV MOD BP:      29.5 ml RIGHT VENTRICLE RV Basal diam:  2.50 cm RV S prime:     12.00 cm/s TAPSE (M-mode): 2.4 cm LEFT ATRIUM             Index        RIGHT ATRIUM          Index LA diam:        2.50 cm 1.43 cm/m   RA Area:     7.92 cm LA Vol (A2C):   35.1 ml 20.12 ml/m  RA Volume:   11.50 ml 6.59 ml/m LA Vol (A4C):   22.3 ml 12.78 ml/m LA Biplane Vol: 29.0 ml 16.62 ml/m  AORTIC VALVE                 PULMONIC VALVE AV Area (Vmax): 1.72 cm     PV Vmax:       0.98 m/s AV Vmax:        130.00 cm/s  PV Peak grad:  3.8 mmHg AV Peak Grad:   6.8 mmHg LVOT Vmax:      79.00 cm/s LVOT Vmean:     55.300 cm/s LVOT VTI:       0.154 m AI PHT:         508 msec  AORTA Ao Root diam: 2.90 cm MITRAL VALVE MV Area (PHT): 4.77 cm    SHUNTS MV Decel Time: 159 msec    Systemic VTI:  0.15 m MV E velocity: 46.70 cm/s  Systemic Diam: 1.90 cm MV A velocity: 67.70 cm/s MV E/A ratio:  0.69 Shaukat Khan Electronically signed by Neoma Laming Signature Date/Time: 03/24/2022/5:13:51 PM    Final    US Venous Img  Lower Bilateral (DVT)  Result Date: 03/24/2022 CLINICAL DATA:  Lower extremity edema EXAM: BILATERAL LOWER EXTREMITY VENOUS DOPPLER ULTRASOUND TECHNIQUE: Gray-scale sonography with graded compression, as well as color Doppler and duplex ultrasound were performed to evaluate the lower extremity deep venous systems from the level of the common femoral vein and including the common femoral, femoral, profunda femoral, popliteal and calf veins including the posterior tibial, peroneal and gastrocnemius veins when visible. Spectral Doppler was utilized to evaluate flow at rest and with distal  augmentation maneuvers in the common femoral, femoral and popliteal veins. COMPARISON:  None Available. FINDINGS: RIGHT LOWER EXTREMITY Common Femoral Vein: No evidence of thrombus. Normal compressibility, respiratory phasicity and response to augmentation. Saphenofemoral Junction: No evidence of thrombus. Normal compressibility and flow on color Doppler imaging. Profunda Femoral Vein: No evidence of thrombus. Normal compressibility and flow on color Doppler imaging. Femoral Vein: No evidence of thrombus. Normal compressibility, respiratory phasicity and response to augmentation. Popliteal Vein: No evidence of thrombus. Normal compressibility, respiratory phasicity and response to augmentation. Calf Veins: No evidence of thrombus. Normal compressibility and flow on color Doppler imaging. LEFT LOWER EXTREMITY Common Femoral Vein: No evidence of thrombus. Normal compressibility, respiratory phasicity and response to augmentation. Saphenofemoral Junction: No evidence of thrombus. Normal compressibility and flow on color Doppler imaging. Profunda Femoral Vein: No evidence of thrombus. Normal compressibility and flow on color Doppler imaging. Femoral Vein: No evidence of thrombus. Normal compressibility, respiratory phasicity and response to augmentation. Popliteal Vein: No evidence of thrombus. Normal compressibility, respiratory  phasicity and response to augmentation. Calf Veins: No evidence of thrombus. Normal compressibility and flow on color Doppler imaging. IMPRESSION: No evidence of deep venous thrombosis in either lower extremity. Electronically Signed   By: Jerilynn Mages.  Shick M.D.   On: 03/24/2022 16:15   CT Angio Chest PE W and/or Wo Contrast  Result Date: 03/23/2022 CLINICAL DATA:  Chest pain, pulmonary embolism suspected EXAM: CT ANGIOGRAPHY CHEST WITH CONTRAST TECHNIQUE: Multidetector CT imaging of the chest was performed using the standard protocol during bolus administration of intravenous contrast. Multiplanar CT image reconstructions and MIPs were obtained to evaluate the vascular anatomy. RADIATION DOSE REDUCTION: This exam was performed according to the departmental dose-optimization program which includes automated exposure control, adjustment of the mA and/or kV according to patient size and/or use of iterative reconstruction technique. CONTRAST:  17m OMNIPAQUE IOHEXOL 350 MG/ML SOLN COMPARISON:  02/26/2022 CT chest abdomen pelvis. FINDINGS: Cardiovascular: Satisfactory opacification of the pulmonary arteries to the segmental level. Thrombus is noted in a right lower lobe posterior segment artery (series 5, image 286). Evidence of right heart strain with an RV:LV ratio of 1.13 and some reflux of contrast into the hepatic veins, although the appearance of the right ventricle appears similar to 02/26/2022. Chronic stenosis of some left upper lobe pulmonary arteries (series 5, image 174), related to chronic collapse of the posterior left upper lobe, which also demonstrates radiation fiducials. Normal heart size. Thrombus in the left atrial appendage (series 5, image 196), which is new compared to 02/26/2022. No pericardial effusion. Mediastinum/Nodes: No enlarged mediastinal, hilar, or axillary lymph nodes. Thyroid gland, trachea, and esophagus demonstrate no significant findings. Lungs/Pleura: Redemonstrated innumerable  pulmonary nodules, which do seem to have increased in number consistent with metastatic disease. Redemonstrated chronic collapse of a portion of the posterior left upper lobe with radiation fiducials. Upper Abdomen: No acute abnormality. Musculoskeletal: Review of the MIP images confirms the above findings. IMPRESSION: 1. Positive for acute PE in a right lower lobe posterior segment artery with CT evidence of right heart strain (RV/LV Ratio = 1.13) consistent with at least submassive (intermediate risk) PE. The presence of right heart strain has been associated with an increased risk of morbidity and mortality. Please refer to the "Code PE Focused" order set in EPIC. Given the small amount of actual pulmonary thrombus and a similar appearance of the right ventricle on the 02/26/2022 CT, this may be chronic and in part related to stenosis of the left upper lobe pulmonary arteries,  secondary to postradiation changes in the left lung. 2. Thrombus in the left atrial appendage, which is new compared to 02/26/2022. 3. Innumerable pulmonary nodules, which have increased in number compared to 02/26/2022, consistent with metastatic disease. These results were called by telephone at the time of interpretation on 03/23/2022 at 12:28 pm to provider [employer] , who verbally acknowledged these results. Electronically Signed   By: Merilyn Baba M.D.   On: 03/23/2022 12:31   DG Chest Portable 1 View  Result Date: 03/23/2022 CLINICAL DATA:  Chest pain. EXAM: PORTABLE CHEST 1 VIEW COMPARISON:  Prior chest x-ray on 08/01/2021 and CT of the chest on 02/26/2022 FINDINGS: Normal heart size. Stable emphysematous lung disease and again suggestion of multiple bilateral pulmonary nodules as depicted by recent CT. Stable chronic collapse of the medial left upper lobe. No pneumothorax, overt pulmonary edema or pleural fluid identified. IMPRESSION: Stable emphysematous lung disease, multiple bilateral pulmonary nodules and chronic  collapse of the medial left upper lobe. Electronically Signed   By: Aletta Edouard M.D.   On: 03/23/2022 10:40     ONCOLOGY HISTORY:  Biopsy of lung nodule on November 27, 2016 revealed adenocarcinoma, but there was no invasive component on the sample. Biopsy of mediastinal lymph node had insufficient material. Patient ultimately declined any further biopsies or surgery and elected to proceed with XRT alone which he completed in April 2018.  PET scan on January 22, 2019 reviewed independently with increased metabolic activity prior to previous with SUV increasing from 6.5 up to 8.8.  Patient declined intervention at that time and elected to proceed with simple observation.  Repeat CT scan on April 21, 2019 reviewed independently with progressive 6.4 cm mass in the left upper lobe.  Patient declined systemic treatment, but agreed to proceed with XRT which she completed in approximately August 2020.  CT scan on September 23, 2019 reviewed independently with no obvious evidence of recurrent or progressive disease.   ASSESSMENT: Progressive adenocarcinoma of upper lobe of left lung   PLAN:   1.  Progressive adenocarcinoma of upper lobe of left lung: PET scan results from August 17, 2021 reviewed independently with interval continued progression of bony metastatic disease and a new hypermetabolic bone lesion in the right sacrum.  Bilateral pulmonary nodules were stable, but concerning for metastatic involvement.  At that time we discussed systemic treatment, but patient and his wife elected for continued active surveillance.  Previously he stated he would not want aggressive chemotherapy but would consider immunotherapy if needed despite PD-L1 being unknown.  He repeated again today that he does not want aggressive chemotherapy.  CT scan on Feb 26, 2022 and again on March 23, 2022 revealed minimally progressive disease, but patient wishes to continue single agent Keytruda with the idea that it is slowing the growth of  his malignancy, but not resolving it.  He does not wish to have treatment today, therefore return to clinic in 1 week for further evaluation and reconsideration of cycle 6.  Patient also receives Zometa on odd-numbered cycles. 2.  Shoulder/back pain: Patient does not complain of this today.  Previously, patient reported this has been evident for years/decades.  Continue tramadol as needed.   3.  Shortness of breath/CHF: Patient does not complain of this today.  Follow-up with cardiology as needed. 4.  Bony disease: Patient receives Zometa on odd-numbered cycles as above.   5.  Leukocytosis: Chronic and unchanged. 6.  Hyponatremia: Chronic and unchanged.  Patient's most recent sodium is 132.  7.  Hypokalemia: Resolved.  He has been instructed to only take his oral supplementation when he takes his diuretic.   8.  Foot pain/neuropathy: X-ray from February 09, 2022 revealed joint osteoarthritis.  Patient had an appointment with occupational therapy earlier this week.   9.  Poor appetite/weight loss: Patient was previously given a prescription for Megace. 10.  Pulmonary embolism: Patient currently on daily Lovenox.  Patient was on Eliquis at the time of his PE and states that he did not miss any doses, therefore we will have to consider him an Eliquis failure.  Have reached out to cardiology to assess whether switching to oral Pradaxa or Xarelto is acceptable. 11.  Weakness and fatigue: Patient was given referrals for home health, home palliative care, as well as the [facility] program.   Patient expressed understanding and was in agreement with this plan. He also understands that He can call clinic at any time with any questions, concerns, or complaints.     [name], MD   03/30/2022 10:42 AM

## 2022-03-28 ENCOUNTER — Inpatient Hospital Stay: Payer: Medicare HMO | Attending: Nurse Practitioner | Admitting: Occupational Therapy

## 2022-03-28 DIAGNOSIS — E871 Hypo-osmolality and hyponatremia: Secondary | ICD-10-CM | POA: Insufficient documentation

## 2022-03-28 DIAGNOSIS — R5383 Other fatigue: Secondary | ICD-10-CM | POA: Insufficient documentation

## 2022-03-28 DIAGNOSIS — R531 Weakness: Secondary | ICD-10-CM

## 2022-03-28 DIAGNOSIS — R630 Anorexia: Secondary | ICD-10-CM | POA: Insufficient documentation

## 2022-03-28 DIAGNOSIS — M79671 Pain in right foot: Secondary | ICD-10-CM

## 2022-03-28 DIAGNOSIS — C7951 Secondary malignant neoplasm of bone: Secondary | ICD-10-CM | POA: Insufficient documentation

## 2022-03-28 DIAGNOSIS — C3412 Malignant neoplasm of upper lobe, left bronchus or lung: Secondary | ICD-10-CM | POA: Insufficient documentation

## 2022-03-28 DIAGNOSIS — Z7901 Long term (current) use of anticoagulants: Secondary | ICD-10-CM | POA: Insufficient documentation

## 2022-03-28 DIAGNOSIS — I2699 Other pulmonary embolism without acute cor pulmonale: Secondary | ICD-10-CM | POA: Insufficient documentation

## 2022-03-28 DIAGNOSIS — I252 Old myocardial infarction: Secondary | ICD-10-CM | POA: Insufficient documentation

## 2022-03-28 DIAGNOSIS — M19079 Primary osteoarthritis, unspecified ankle and foot: Secondary | ICD-10-CM | POA: Insufficient documentation

## 2022-03-28 DIAGNOSIS — Z87891 Personal history of nicotine dependence: Secondary | ICD-10-CM | POA: Insufficient documentation

## 2022-03-28 DIAGNOSIS — R634 Abnormal weight loss: Secondary | ICD-10-CM | POA: Insufficient documentation

## 2022-03-28 DIAGNOSIS — D72829 Elevated white blood cell count, unspecified: Secondary | ICD-10-CM | POA: Insufficient documentation

## 2022-03-28 NOTE — Discharge Summary (Signed)
Physician Discharge Summary   Patient: Scott Gross MRN: [id number] DOB: 1934/04/17  Admit date:     03/23/2022  Discharge date: 03/26/2022  Discharge Physician: Max Sane   PCP: [name], MD   Recommendations at discharge:   Follow-up with outpatient providers as requested  Discharge Diagnoses: Principal Problem:   Acute pulmonary embolus (HCC) Active Problems:   AF (paroxysmal atrial fibrillation) (HCC)   Atrial thrombus   Cancer of upper lobe of left lung (HCC)   Diabetes mellitus type 2, uncomplicated (HCC)   Chronic systolic CHF (congestive heart failure) (HCC)   Hypertension   Protein-calorie malnutrition, severe   CAD (coronary artery disease)   Hyperlipidemia  Hospital Course: Patient is an 88 year old male with past medical history of CAD status post stenting, left upper lobe lung cancer, atrial fibrillation and systolic/diastolic heart failure admitted on 6/9 after presenting with chest pain and found to have an acute pulmonary embolus despite Eliquis.  In addition, CT scan chest also noted left atrial thrombus.  Patient admitted to the hospitalist service and started on heparin.  Patient seen by oncology who recommended patient be switched over from Eliquis to Lovenox full dosing.   Assessment and Plan: * Acute pulmonary embolus ([place]) Hemodynamically stable.  Initially treated with heparin drip but as per oncology recommendations after discussion with family patient was switched over to Lovenox.  Echocardiogram unrevealing. Patient is not hypoxic.  Failed outpatient Eliquis although this may be in part due to combination of cancer and use of Megace.  Will need Lovenox at least for 6 weeks and patient can follow-up with his oncologist to determine if alternative oral medication even such as Coumadin can be used. -Patient was provided education on risks and benefit of Lovenox by pharmacist  Atrial thrombus Noted on CT on admission.  Continue Lovenox at discharge  based on cardiology and oncology recommendation  AF (paroxysmal atrial fibrillation) ([place]) Rate controlled with amiodarone.  Heparin drip for anticoagulation, was on Eliquis at home which is stopped.  Changing over to Lovenox  Diabetes mellitus type 2, uncomplicated (HCC) L7L 6.2  Cancer of upper lobe of left lung (HCC) Followed by Dr. Rogue Bussing  Chronic systolic CHF (congestive heart failure) ([place]) Well compensated at this time.  Continue losartan.  EF 45 to 50% on last echo  Hypertension Continue Cozaar, HCTZ and adjust blood pressure medicine as need  Protein-calorie malnutrition, severe Overall poor p.o. intake.  Encouraged oral nutrition BMI 17.8.    CAD (coronary artery disease) Continue Crestor, Cozaar, HCTZ, amiodarone - f/by cardiology Dr. Clayborn Bigness who had seen the patient while in the hospital at family's request/courtesy consult  Hyperlipidemia Continue Crestor         Consultants: Oncology Disposition: Home Diet recommendation:  Discharge Diet Orders (From admission, onward)     Start     Ordered   03/26/22 0000  Diet - low sodium heart healthy        03/26/22 0808           Carb modified diet DISCHARGE MEDICATION: Allergies as of 03/26/2022       Reactions   Statins Other (See Comments)   Arthralagia        Medication List     STOP taking these medications    apixaban 5 MG Tabs tablet Commonly known as: ELIQUIS   clopidogrel 75 MG tablet Commonly known as: PLAVIX   megestrol 40 MG tablet Commonly known as: MEGACE   meloxicam 15 MG tablet Commonly known as:  Mobic       TAKE these medications    albuterol 108 (90 Base) MCG/ACT inhaler Commonly known as: VENTOLIN HFA Inhale into the lungs every 6 (six) hours as needed.   albuterol (2.5 MG/3ML) 0.083% nebulizer solution Commonly known as: PROVENTIL SMARTSIG:3 Milliliter(s) Via Nebulizer Every 6 Hours PRN   amiodarone 200 MG tablet Commonly known as: Pacerone Take 1  tablet (200 mg total) by mouth 2 (two) times daily. What changed:  how much to take when to take this   amLODipine 5 MG tablet Commonly known as: NORVASC Take by mouth.   clotrimazole-betamethasone cream Commonly known as: Lotrisone Apply to affected area 2 times daily   enoxaparin 100 MG/ML injection Commonly known as: LOVENOX Inject 0.975 mLs (97.5 mg total) into the skin daily.   guaiFENesin 600 MG 12 hr tablet Commonly known as: MUCINEX Take 600 mg by mouth 2 (two) times daily as needed for cough or to loosen phlegm.   losartan-hydrochlorothiazide 50-12.5 MG tablet Commonly known as: HYZAAR Take 1 tablet by mouth daily.   naproxen sodium 220 MG tablet Commonly known as: ALEVE Take 220 mg by mouth 2 (two) times daily with a meal.   potassium chloride SA 20 MEQ tablet Commonly known as: KLOR-CON M Take 1 tablet (20 mEq total) by mouth daily.   rosuvastatin 5 MG tablet Commonly known as: CRESTOR Take 2.5 mg by mouth daily.   torsemide 20 MG tablet Commonly known as: DEMADEX Take 1 tablet (20 mg total) by mouth daily.   traMADol 50 MG tablet Commonly known as: ULTRAM Take 1 tablet (50 mg total) by mouth 2 (two) times daily. As needed for pain.        Follow-up Information     [name], MD. Schedule an appointment as soon as possible for a visit in 1 week(s).   Specialty: Family Medicine Why: [facility] 2023 @ 1:30 PM Contact information: [place] [address] [phone]         [name], MD. Schedule an appointment as soon as possible for a visit on 03/29/2022.   Specialty: Oncology Why: AS SCHEDULED: JUNE 15, 2:30 PM Contact information: [place] [address] [phone]         Lujean Amel D, MD. Schedule an appointment as soon as possible for a visit on 04/11/2022.   Specialties: Cardiology, Internal Medicine Why: APPOINTMENT: JUNE 28TH 2:30 PM Contact information: [place] [address] [phone]                Discharge Exam: Danley Danker Weights   03/25/22 1151 03/26/22 0450 03/26/22 0524  Weight: 58.1 kg 58.1 kg 65 kg   General: Alert and oriented x3, no acute distress HEENT: Normocephalic, atraumatic, mucous membranes are moist Cardiovascular: Regular rate and rhythm, S1-S2 Respiratory: Clear to auscultation bilaterally Abdomen: Soft, nontender, nondistended, positive bowel sounds Musculoskeletal: No clubbing or cyanosis or edema Skin: No skin breaks, tears or lesions Psychiatry: Appropriate, no evidence of psychoses Neurology: No focal deficits  Condition at discharge: good  The results of significant diagnostics from this hospitalization (including imaging, microbiology, ancillary and laboratory) are listed below for reference.   Imaging Studies: ECHOCARDIOGRAM COMPLETE  Result Date: 03/24/2022    ECHOCARDIOGRAM REPORT   Patient Name:   Scott Gross Date of Exam: 03/24/2022 Medical Rec #:  [id number]       Height:       71.0 in Accession #:  [id number]      Weight:       128.1 lb Date of Birth:  16-Dec-1933       BSA:          1.745 m Patient Age:    88 years        BP:           156/81 mmHg Patient Gender: M               HR:           82 bpm. Exam Location:  [facility] Procedure: 2D Echo Indications:     Pulmonary Embolus I26.09  History:         Patient has prior history of Echocardiogram examinations, most                  recent 06/06/2021.  Sonographer:     Kathlen Brunswick RDCS Referring Phys:  [place] Diagnosing Phys: [place]  1. Left ventricular ejection fraction, by estimation, is 45 to 50%. The left ventricle has mildly decreased function. The left ventricle demonstrates global hypokinesis. There is mild concentric left ventricular hypertrophy. Left ventricular diastolic parameters are consistent with Grade I diastolic dysfunction (impaired relaxation).  2. Right ventricular systolic function is normal.  The right ventricular size is normal.  3. Left atrial size was mildly dilated.  4. Right atrial size was mildly dilated.  5. The mitral valve is normal in structure. Trivial mitral valve regurgitation. No evidence of mitral stenosis.  6. The aortic valve is normal in structure. Aortic valve regurgitation is mild to moderate. Aortic valve sclerosis is present, with no evidence of aortic valve stenosis.  7. The inferior vena cava is normal in size with greater than 50% respiratory variability, suggesting right atrial pressure of 3 mmHg. FINDINGS  Left Ventricle: Left ventricular ejection fraction, by estimation, is 45 to 50%. The left ventricle has mildly decreased function. The left ventricle demonstrates global hypokinesis. The left ventricular internal cavity size was normal in size. There is  mild concentric left ventricular hypertrophy. Left ventricular diastolic parameters are consistent with Grade I diastolic dysfunction (impaired relaxation). Right Ventricle: The right ventricular size is normal. No increase in right ventricular wall thickness. Right ventricular systolic function is normal. Left Atrium: Left atrial size was mildly dilated. Right Atrium: Right atrial size was mildly dilated. Pericardium: There is no evidence of pericardial effusion. Mitral Valve: The mitral valve is normal in structure. Trivial mitral valve regurgitation. No evidence of mitral valve stenosis. Tricuspid Valve: The tricuspid valve is normal in structure. Tricuspid valve regurgitation is trivial. No evidence of tricuspid stenosis. Aortic Valve: The aortic valve is normal in structure. Aortic valve regurgitation is mild to moderate. Aortic regurgitation PHT measures 508 msec. Aortic valve sclerosis is present, with no evidence of aortic valve stenosis. Aortic valve peak gradient measures 6.8 mmHg. Pulmonic Valve: The pulmonic valve was normal in structure. Pulmonic valve regurgitation is trivial. No evidence of pulmonic stenosis.  Aorta: The aortic root is normal in size and structure. Venous: The inferior vena cava is normal in size with greater than 50% respiratory variability, suggesting right atrial pressure of 3 mmHg. IAS/Shunts: No atrial level shunt detected by color flow Doppler.  LEFT VENTRICLE PLAX 2D LVIDd:         3.56 cm     Diastology LVIDs:         2.77 cm     LV e' medial:    3.81 cm/s  LV PW:         1.00 cm     LV E/e' medial:  12.3 LV IVS:        0.78 cm     LV e' lateral:   3.92 cm/s LVOT diam:     1.90 cm     LV E/e' lateral: 11.9 LV SV:         44 LV SV Index:   25 LVOT Area:     2.84 cm  LV Volumes (MOD) LV vol d, MOD A2C: 39.6 ml LV vol d, MOD A4C: 84.2 ml LV vol s, MOD A2C: 22.0 ml LV vol s, MOD A4C: 42.0 ml LV SV MOD A2C:     17.6 ml LV SV MOD A4C:     84.2 ml LV SV MOD BP:      29.5 ml RIGHT VENTRICLE RV Basal diam:  2.50 cm RV S prime:     12.00 cm/s TAPSE (M-mode): 2.4 cm LEFT ATRIUM             Index        RIGHT ATRIUM          Index LA diam:        2.50 cm 1.43 cm/m   RA Area:     7.92 cm LA Vol (A2C):   35.1 ml 20.12 ml/m  RA Volume:   11.50 ml 6.59 ml/m LA Vol (A4C):   22.3 ml 12.78 ml/m LA Biplane Vol: 29.0 ml 16.62 ml/m  AORTIC VALVE                 PULMONIC VALVE AV Area (Vmax): 1.72 cm     PV Vmax:       0.98 m/s AV Vmax:        130.00 cm/s  PV Peak grad:  3.8 mmHg AV Peak Grad:   6.8 mmHg LVOT Vmax:      79.00 cm/s LVOT Vmean:     55.300 cm/s LVOT VTI:       0.154 m AI PHT:         508 msec  AORTA Ao Root diam: 2.90 cm MITRAL VALVE MV Area (PHT): 4.77 cm    SHUNTS MV Decel Time: 159 msec    Systemic VTI:  0.15 m MV E velocity: 46.70 cm/s  Systemic Diam: 1.90 cm MV A velocity: 67.70 cm/s MV E/A ratio:  0.69 Shaukat Khan Electronically signed by Neoma Laming Signature Date/Time: 03/24/2022/5:13:51 PM    Final    US Venous Img Lower Bilateral (DVT)  Result Date: 03/24/2022 CLINICAL DATA:  Lower extremity edema EXAM: BILATERAL LOWER EXTREMITY VENOUS DOPPLER ULTRASOUND TECHNIQUE: Gray-scale  sonography with graded compression, as well as color Doppler and duplex ultrasound were performed to evaluate the lower extremity deep venous systems from the level of the common femoral vein and including the common femoral, femoral, profunda femoral, popliteal and calf veins including the posterior tibial, peroneal and gastrocnemius veins when visible. Spectral Doppler was utilized to evaluate flow at rest and with distal augmentation maneuvers in the common femoral, femoral and popliteal veins. COMPARISON:  None Available. FINDINGS: RIGHT LOWER EXTREMITY Common Femoral Vein: No evidence of thrombus. Normal compressibility, respiratory phasicity and response to augmentation. Saphenofemoral Junction: No evidence of thrombus. Normal compressibility and flow on color Doppler imaging. Profunda Femoral Vein: No evidence of thrombus. Normal compressibility and flow on color Doppler imaging. Femoral Vein: No evidence of thrombus. Normal compressibility, respiratory phasicity and response to augmentation. Popliteal Vein:  No evidence of thrombus. Normal compressibility, respiratory phasicity and response to augmentation. Calf Veins: No evidence of thrombus. Normal compressibility and flow on color Doppler imaging. LEFT LOWER EXTREMITY Common Femoral Vein: No evidence of thrombus. Normal compressibility, respiratory phasicity and response to augmentation. Saphenofemoral Junction: No evidence of thrombus. Normal compressibility and flow on color Doppler imaging. Profunda Femoral Vein: No evidence of thrombus. Normal compressibility and flow on color Doppler imaging. Femoral Vein: No evidence of thrombus. Normal compressibility, respiratory phasicity and response to augmentation. Popliteal Vein: No evidence of thrombus. Normal compressibility, respiratory phasicity and response to augmentation. Calf Veins: No evidence of thrombus. Normal compressibility and flow on color Doppler imaging. IMPRESSION: No evidence of deep venous  thrombosis in either lower extremity. Electronically Signed   By: Jerilynn Mages.  Shick M.D.   On: 03/24/2022 16:15   CT Angio Chest PE W and/or Wo Contrast  Result Date: 03/23/2022 CLINICAL DATA:  Chest pain, pulmonary embolism suspected EXAM: CT ANGIOGRAPHY CHEST WITH CONTRAST TECHNIQUE: Multidetector CT imaging of the chest was performed using the standard protocol during bolus administration of intravenous contrast. Multiplanar CT image reconstructions and MIPs were obtained to evaluate the vascular anatomy. RADIATION DOSE REDUCTION: This exam was performed according to the departmental dose-optimization program which includes automated exposure control, adjustment of the mA and/or kV according to patient size and/or use of iterative reconstruction technique. CONTRAST:  2mL OMNIPAQUE IOHEXOL 350 MG/ML SOLN COMPARISON:  02/26/2022 CT chest abdomen pelvis. FINDINGS: Cardiovascular: Satisfactory opacification of the pulmonary arteries to the segmental level. Thrombus is noted in a right lower lobe posterior segment artery (series 5, image 286). Evidence of right heart strain with an RV:LV ratio of 1.13 and some reflux of contrast into the hepatic veins, although the appearance of the right ventricle appears similar to 02/26/2022. Chronic stenosis of some left upper lobe pulmonary arteries (series 5, image 174), related to chronic collapse of the posterior left upper lobe, which also demonstrates radiation fiducials. Normal heart size. Thrombus in the left atrial appendage (series 5, image 196), which is new compared to 02/26/2022. No pericardial effusion. Mediastinum/Nodes: No enlarged mediastinal, hilar, or axillary lymph nodes. Thyroid gland, trachea, and esophagus demonstrate no significant findings. Lungs/Pleura: Redemonstrated innumerable pulmonary nodules, which do seem to have increased in number consistent with metastatic disease. Redemonstrated chronic collapse of a portion of the posterior left upper lobe with  radiation fiducials. Upper Abdomen: No acute abnormality. Musculoskeletal: Review of the MIP images confirms the above findings. IMPRESSION: 1. Positive for acute PE in a right lower lobe posterior segment artery with CT evidence of right heart strain (RV/LV Ratio = 1.13) consistent with at least submassive (intermediate risk) PE. The presence of right heart strain has been associated with an increased risk of morbidity and mortality. Please refer to the "Code PE Focused" order set in EPIC. Given the small amount of actual pulmonary thrombus and a similar appearance of the right ventricle on the 02/26/2022 CT, this may be chronic and in part related to stenosis of the left upper lobe pulmonary arteries, secondary to postradiation changes in the left lung. 2. Thrombus in the left atrial appendage, which is new compared to 02/26/2022. 3. Innumerable pulmonary nodules, which have increased in number compared to 02/26/2022, consistent with metastatic disease. These results were called by telephone at the time of interpretation on 03/23/2022 at 12:28 pm to provider [facility] , who verbally acknowledged these results. Electronically Signed   By: Merilyn Baba M.D.   On: 03/23/2022 12:31  DG Chest Portable 1 View  Result Date: 03/23/2022 CLINICAL DATA:  Chest pain. EXAM: PORTABLE CHEST 1 VIEW COMPARISON:  Prior chest x-ray on 08/01/2021 and CT of the chest on 02/26/2022 FINDINGS: Normal heart size. Stable emphysematous lung disease and again suggestion of multiple bilateral pulmonary nodules as depicted by recent CT. Stable chronic collapse of the medial left upper lobe. No pneumothorax, overt pulmonary edema or pleural fluid identified. IMPRESSION: Stable emphysematous lung disease, multiple bilateral pulmonary nodules and chronic collapse of the medial left upper lobe. Electronically Signed   By: Aletta Edouard M.D.   On: 03/23/2022 10:40    Microbiology: Results for orders placed or performed during the  hospital encounter of 08/01/21  Resp Panel by RT-PCR (Flu A&B, Covid) Nasopharyngeal Swab     Status: None   Collection Time: 08/01/21  4:45 AM   Specimen: Nasopharyngeal Swab; Nasopharyngeal(NP) swabs in vial transport medium  Result Value Ref Range Status   SARS Coronavirus 2 by RT PCR NEGATIVE NEGATIVE Final    Comment: (NOTE) SARS-CoV-2 target nucleic acids are NOT DETECTED.  The SARS-CoV-2 RNA is generally detectable in upper respiratory specimens during the acute phase of infection. The lowest concentration of SARS-CoV-2 viral copies this assay can detect is 138 copies/mL. A negative result does not preclude SARS-Cov-2 infection and should not be used as the sole basis for treatment or other patient management decisions. A negative result may occur with  improper specimen collection/handling, submission of specimen other than nasopharyngeal swab, presence of viral mutation(s) within the areas targeted by this assay, and inadequate number of viral copies(<138 copies/mL). A negative result must be combined with clinical observations, patient history, and epidemiological information. The expected result is Negative.  Fact Sheet for Patients:  EntrepreneurPulse.com.au  Fact Sheet for Healthcare Providers:  IncredibleEmployment.be  This test is no t yet approved or cleared by the [place] FDA and  has been authorized for detection and/or diagnosis of SARS-CoV-2 by FDA under an Emergency Use Authorization (EUA). This EUA will remain  in effect (meaning this test can be used) for the duration of the COVID-19 declaration under Section 564(b)(1) of the Act, 21 U.S.C.section 360bbb-3(b)(1), unless the authorization is terminated  or revoked sooner.       Influenza A by PCR NEGATIVE NEGATIVE Final   Influenza B by PCR NEGATIVE NEGATIVE Final    Comment: (NOTE) The Xpert Xpress SARS-CoV-2/FLU/RSV plus assay is intended as an aid in the  diagnosis of influenza from Nasopharyngeal swab specimens and should not be used as a sole basis for treatment. Nasal washings and aspirates are unacceptable for Xpert Xpress SARS-CoV-2/FLU/RSV testing.  Fact Sheet for Patients: EntrepreneurPulse.com.au  Fact Sheet for Healthcare Providers: IncredibleEmployment.be  This test is not yet approved or cleared by the [place] FDA and has been authorized for detection and/or diagnosis of SARS-CoV-2 by FDA under an Emergency Use Authorization (EUA). This EUA will remain in effect (meaning this test can be used) for the duration of the COVID-19 declaration under Section 564(b)(1) of the Act, 21 U.S.C. section 360bbb-3(b)(1), unless the authorization is terminated or revoked.  Performed at [facility], [address] [place], [place] [address]     Labs: CBC: Recent Labs  Lab 03/23/22 1005 03/24/22 0749 03/25/22 0550 03/26/22 0554  WBC 12.3* 12.0* 12.4* 13.9*  HGB 15.6 13.9 13.9 13.9  HCT 47.4 41.3 41.9 41.3  MCV 83.0 81.1 80.7 81.0  PLT 235 230 233 240   Basic Metabolic Panel: Recent Labs  Lab 03/23/22 1005 03/24/22 0749  NA 133* 132*  K 4.7 4.6  CL 99 100  CO2 25 25  GLUCOSE 134* 131*  BUN 13 12  CREATININE 0.79 0.76  CALCIUM 8.4* 8.4*   Liver Function Tests: Recent Labs  Lab 03/23/22 1005 03/24/22 0749  AST 48* 32  ALT 28 26  ALKPHOS 403* 393*  BILITOT 1.0 0.7  PROT 7.9 6.8  ALBUMIN 4.0 3.6   CBG: Recent Labs  Lab 03/25/22 0811 03/25/22 1152 03/25/22 1510 03/25/22 2006 03/26/22 0810  GLUCAP 123* 133* 120* 166* 133*    Discharge time spent: greater than 30 minutes.  Signed: Max Sane, MD [facility] 03/28/2022

## 2022-03-28 NOTE — Therapy (Signed)
[facility] at [facility], [place], [place], [address] Phone: [phone]   Fax:  [phone]  Occupational Therapy Screen:  Patient Details  Name: Scott Gross MRN: [id number] Date of Birth: 20-Feb-1934 No data recorded  Encounter Date: 03/28/2022   OT End of Session - 03/28/22 1727     Visit Number 0             Past Medical History:  Diagnosis Date   Arthritis    CAD (coronary artery disease) Nov. 12, 2012   Inferior ST elevation MI in November of 2012 with ventricular fibrillation arrest. Status post drug-eluting stent placement to the mid LAD. Most recent cardiac catheterization in June of this year showed patent stent with minimal restenosis, 75% proximal LAD and 80% distal LAD which were unchanged from before. Mild disease in the left circumflex and moderate RCA disease. Ejection fraction was 55%.   Cancer of upper lobe of left lung ([place]) 11/2016   Rad tx's.   Cardiac arrest [facility]) Nov. 2012   x 2    Cardiac arrest - ventricular fibrillation    HOH (hard of hearing)    Left Hearing Aid   Hyperlipidemia    Hypertension    Post PTCA 2013   x2   ST elevation MI (STEMI) ([place]) 08/26/2011    Past Surgical History:  Procedure Laterality Date   CARDIAC CATHETERIZATION  2013   @ [place]; [facility] s/p stent    CORONARY ANGIOPLASTY     CORONARY/GRAFT ACUTE MI REVASCULARIZATION N/A 06/06/2021   Procedure: Coronary/Graft Acute MI Revascularization;  Surgeon: Wellington Hampshire, MD;  Location: [place] [facility];  Service: Cardiovascular;  Laterality: N/A;   ELECTROMAGNETIC NAVIGATION BROCHOSCOPY N/A 11/27/2016   Procedure: ELECTROMAGNETIC NAVIGATION BRONCHOSCOPY;  Surgeon: [name], MD;  Location: [facility];  Service: Cardiopulmonary;  Laterality: N/A;   LEFT HEART CATH AND CORONARY ANGIOGRAPHY N/A 06/06/2021   Procedure: LEFT HEART CATH AND CORONARY ANGIOGRAPHY;  Surgeon: Wellington Hampshire, MD;  Location: [place] [facility];  Service: Cardiovascular;  Laterality: N/A;    There were no vitals filed for this visit.   Subjective Assessment - 03/28/22 1725     Subjective  I am doing okay.  My x-ray shows some arthritis in my feet.  My feet is just achy and feels numb.  Since have been in the hospital 3 weeks ago I am just not as active and more sedentary.  I did not had any falls    Currently in Pain? Yes    Pain Score --   no number provided            [name], MD   03/08/2022 10:53 AM ONCOLOGY HISTORY:  Biopsy of lung nodule on November 27, 2016 revealed adenocarcinoma, but there was no invasive component on the sample. Biopsy of mediastinal lymph node had insufficient material. Patient ultimately declined any further biopsies or surgery and elected to proceed with XRT alone which he completed in April 2018.  PET scan on January 22, 2019 reviewed independently with increased metabolic activity prior to previous with SUV increasing from 6.5 up to 8.8.  Patient declined intervention at that time and elected to proceed with simple observation.  Repeat CT scan on April 21, 2019 reviewed independently with progressive 6.4 cm mass in the left upper lobe.  Patient declined systemic treatment, but agreed to proceed with XRT which she completed in approximately August 2020.  CT scan  on September 23, 2019 reviewed independently with no obvious evidence of recurrent or progressive disease.    ASSESSMENT: Progressive adenocarcinoma of upper lobe of left lung    PLAN:    1.  Progressive adenocarcinoma of upper lobe of left lung: PET scan results from August 17, 2021 reviewed independently with interval continued progression of bony metastatic disease and a new hypermetabolic bone lesion in the right sacrum.  Bilateral pulmonary nodules were stable, but concerning for metastatic involvement.  At that time we discussed systemic treatment, but patient and his wife elected for continued active surveillance.   Previously he stated he would not want aggressive chemotherapy but would consider immunotherapy if needed despite PD-L1 being unknown.  Repeat CT scan on Feb 27, 2022 reviewed independently and reported as above with only minimal progression of disease after only 4 infusions of Keytruda.  Proceed with cycle 5 of treatment today.  Return to clinic in 3 weeks for further evaluation and consideration of cycle 6.   Patient also receives Zometa on odd-numbered cycles. 2.  Shoulder/back pain: Patient does not complain of this today.  Previously, patient reported this has been evident for years/decades.  Continue tramadol as needed.   3.  Shortness of breath/CHF: Patient does not complain of this today.  Follow-up with cardiology as needed. 4.  Bony disease: Patient receives Zometa on odd-numbered cycles as above.  Proceed with treatment today. 5.  Leukocytosis: Chronic and unchanged. 6.  Hyponatremia: Chronic and unchanged. 7.  Hypokalemia: Resolved.  Continue oral supplementation. 8.  Foot pain/neuropathy: X-ray from February 09, 2022 revealed joint osteoarthritis.  Patient missed his appointment with occupational therapy and declined rescheduling.  Patient states he will call if he changes his mind. 9.  Poor appetite/weight loss: Patient was previously given a prescription for Megace.     Patient expressed understanding and was in agreement with this plan. He also understands that He can call clinic at any time with any questions, concerns, or complaints.       OT SCREEN 03/28/22: Patient referred by Dr. Grayland Ormond for OT screen because of foot pain and weakness.  Patient arrived at OT screen in a wheelchair with wife and daughter.  Patient reports since being in the hospital 3 weeks ago with a PE patient has been more sedentary and not as active.  Patient report bilateral feet with the right worse than the left being achy and also numbness.  Patient did not had chemo and do not have a history of diabetes  except prediabetic. Patient denies any falls for the last 3 to 6 months. Patient used to walk across the street in the school parking lot as well as laying supine doing 10 pound weights for upper extremities and push-ups. Patient has a split house with 2 steps but going up slowly he reports.  Patient has a walk-in shower with a seat. Patient independent in bathing and dressing. Oxygen levels was 99%, heart rate 85. BERG balance test was done.  Patient score 48/56 putting him at the lower range or risk for falling Patient had a hard time with standing heel-to-toe.  Was unable to to maintain.  As well as needed tactile cueing supervision with alternating placing foot on stool.  As well as standing on 1 leg. Reviewed with patient as well as wife and daughter home program to be active about 150 minutes a week. Patient still comfortable to divide that into sessions of 12 minutes a day. HEP: -Sit to stand out of a  firm chair not pushing up with hands 10 reps -Sidestepping in the hallway facing the wall or kitchen counter with no loose carpets 1 to 2 minutes -Standing between chairs slowly marching 1 to 2 minutes. -Standing at counter heel raises 10 reps. -And if balance low but better in 2 to 3 weeks daughter can see how patient is doing with hip abduction extension and flexion.  Patient was able to ambulate out of cancer center with wife and daughter to car not using wheelchair with no balance loss or dizziness. Did discuss with pt and family CARE program -they are very interested for patient to be referred by Dr. Grayland Ormond to program. Before COVID patient used to go to the [facility] every day and workout.  Daughter will ask for Dr. Grayland Ormond tomorrow for a referral to the CARE  program Patient wants to continue with home program provided by OT for about 2 to 3 weeks and follow-up with me if needed otherwise can continue with CARE program.                                         Visit Diagnosis: Right foot pain  Weakness    Problem List Patient Active Problem List   Diagnosis Date Noted   Atrial thrombus 03/25/2022   Pulmonary embolism ([place]) 03/24/2022   Acute pulmonary embolus ([place]) 03/23/2022   Pleural effusion    Chronic systolic CHF (congestive heart failure) ([place]) 08/01/2021   Acute respiratory failure ([place]) 08/01/2021   Hypokalemia 08/01/2021   Pulmonary nodules 08/01/2021   A-fib ([place]) 06/09/2021   Atrial fibrillation with rapid ventricular response ([place]) 06/08/2021   Acute ST elevation myocardial infarction (STEMI) of lateral wall ([place]) 06/06/2021   Chronic anticoagulation 06/06/2021   Protein-calorie malnutrition, severe 06/06/2021   Malnutrition of moderate degree 06/22/2019   CAP (community acquired pneumonia) 06/21/2019   SIRS (systemic inflammatory response syndrome) ([place]) 05/23/2019   Atrial fibrillation with RVR ([place])    AF (paroxysmal atrial fibrillation) ([place]) 05/20/2019   Cardiac arrest ([place])    Diabetes mellitus type 2, uncomplicated ([place]) 03/50/0938   Angina pectoris ([place]) 12/17/2016   Adenopathy    Cancer of upper lobe of left lung ([place]) 09/10/2016   Hyperlipidemia    Hypertension    CAD (coronary artery disease) 08/27/2011   Myocardial infarction ([place]) 08/16/2011    Rosalyn Gess, OTR/L,CLT 03/28/2022, 5:27 PM  [facility] [address] [place], [place], [address] Phone: [phone]   Fax:  [phone]  Name: TAIM WURM MRN: [id number] Date of Birth: 1934/05/10

## 2022-03-29 ENCOUNTER — Ambulatory Visit: Payer: Medicare HMO

## 2022-03-29 ENCOUNTER — Other Ambulatory Visit: Payer: Medicare HMO

## 2022-03-29 ENCOUNTER — Inpatient Hospital Stay: Payer: Medicare HMO

## 2022-03-29 ENCOUNTER — Telehealth: Payer: Self-pay | Admitting: Nurse Practitioner

## 2022-03-29 ENCOUNTER — Inpatient Hospital Stay (HOSPITAL_BASED_OUTPATIENT_CLINIC_OR_DEPARTMENT_OTHER): Payer: Medicare HMO | Admitting: Oncology

## 2022-03-29 ENCOUNTER — Ambulatory Visit: Payer: Medicare HMO | Admitting: Oncology

## 2022-03-29 ENCOUNTER — Encounter: Payer: Self-pay | Admitting: Oncology

## 2022-03-29 VITALS — BP 119/75 | HR 91 | Temp 96.4°F | Resp 16 | Ht 71.0 in | Wt 124.8 lb

## 2022-03-29 DIAGNOSIS — C3412 Malignant neoplasm of upper lobe, left bronchus or lung: Secondary | ICD-10-CM | POA: Diagnosis not present

## 2022-03-29 DIAGNOSIS — I252 Old myocardial infarction: Secondary | ICD-10-CM | POA: Diagnosis not present

## 2022-03-29 DIAGNOSIS — C7951 Secondary malignant neoplasm of bone: Secondary | ICD-10-CM | POA: Diagnosis not present

## 2022-03-29 DIAGNOSIS — M19079 Primary osteoarthritis, unspecified ankle and foot: Secondary | ICD-10-CM | POA: Diagnosis not present

## 2022-03-29 DIAGNOSIS — E871 Hypo-osmolality and hyponatremia: Secondary | ICD-10-CM | POA: Diagnosis not present

## 2022-03-29 DIAGNOSIS — Z7901 Long term (current) use of anticoagulants: Secondary | ICD-10-CM | POA: Diagnosis not present

## 2022-03-29 DIAGNOSIS — R634 Abnormal weight loss: Secondary | ICD-10-CM | POA: Diagnosis not present

## 2022-03-29 DIAGNOSIS — R630 Anorexia: Secondary | ICD-10-CM | POA: Diagnosis not present

## 2022-03-29 DIAGNOSIS — Z87891 Personal history of nicotine dependence: Secondary | ICD-10-CM | POA: Diagnosis not present

## 2022-03-29 DIAGNOSIS — R5383 Other fatigue: Secondary | ICD-10-CM | POA: Diagnosis not present

## 2022-03-29 DIAGNOSIS — D72829 Elevated white blood cell count, unspecified: Secondary | ICD-10-CM | POA: Diagnosis not present

## 2022-03-29 DIAGNOSIS — R531 Weakness: Secondary | ICD-10-CM | POA: Diagnosis not present

## 2022-03-29 DIAGNOSIS — I2699 Other pulmonary embolism without acute cor pulmonale: Secondary | ICD-10-CM | POA: Diagnosis not present

## 2022-03-29 MED ORDER — CLOTRIMAZOLE-BETAMETHASONE 1-0.05 % EX CREA
TOPICAL_CREAM | CUTANEOUS | 1 refills | Status: AC
Start: 2022-03-29 — End: 2023-03-29

## 2022-03-29 NOTE — Telephone Encounter (Signed)
Attempted to contact patient/wife at home #, to offer to schedule a Palliative Consult, no answer - left message with reason for call along with my name and call back number, requesting a return call to schedule visit.

## 2022-03-30 ENCOUNTER — Encounter: Payer: Self-pay | Admitting: Oncology

## 2022-03-30 ENCOUNTER — Telehealth: Payer: Self-pay | Admitting: Nurse Practitioner

## 2022-03-30 NOTE — Progress Notes (Signed)
[place]  [phone] [phone]  ID: Scott Gross OB: 1934/09/18  MR#: [id number]  XQJ#:[id number]  Patient Care Team: [name], MD as PCP - General (Family Medicine) [name], MD as Consulting Physician (Oncology)   CHIEF COMPLAINT: Progressive adenocarcinoma of upper lobe of left lung.  INTERVAL HISTORY: Patient returns to clinic today for further evaluation and reconsideration of cycle 6 of single agent Keytruda.  Patient has increasing weakness and fatigue over the past week.  He does not complain of back pain today.  He states his appetite has improved.  He has no neurologic complaints. He denies any chest pain, shortness of breath, cough, or hemoptysis.  He has no nausea, vomiting, constipation, or diarrhea. He has no urinary complaints.  Patient offers no further specific complaints today.  REVIEW OF SYSTEMS:   Review of Systems  Constitutional:  Positive for malaise/fatigue. Negative for fever and weight loss.  Respiratory: Negative.  Negative for cough, hemoptysis and shortness of breath.   Cardiovascular: Negative.  Negative for chest pain and leg swelling.  Gastrointestinal: Negative.  Negative for abdominal pain.  Genitourinary: Negative.  Negative for dysuria.  Musculoskeletal: Negative.  Negative for back pain and joint pain.  Skin: Negative.  Negative for rash.  Neurological:  Positive for weakness. Negative for sensory change, focal weakness and headaches.  Psychiatric/Behavioral: Negative.  The patient is not nervous/anxious.     As per HPI. Otherwise, a complete review of systems is negative.  PAST MEDICAL HISTORY: Past Medical History:  Diagnosis Date   Arthritis    CAD (coronary artery disease) Nov. 12, 2012   Inferior ST elevation MI in November of 2012 with ventricular fibrillation arrest. Status post drug-eluting stent placement to the mid LAD. Most recent cardiac catheterization in June of this  year showed patent stent with minimal restenosis, 75% proximal LAD and 80% distal LAD which were unchanged from before. Mild disease in the left circumflex and moderate RCA disease. Ejection fraction was 55%.   Cancer of upper lobe of left lung ([place]) 11/2016   Rad tx's.   Cardiac arrest [facility]) Nov. 2012   x 2    Cardiac arrest - ventricular fibrillation    HOH (hard of hearing)    Left Hearing Aid   Hyperlipidemia    Hypertension    Post PTCA 2013   x2   ST elevation MI (STEMI) ([place]) 08/26/2011    PAST SURGICAL HISTORY: Past Surgical History:  Procedure Laterality Date   CARDIAC CATHETERIZATION  2013   @ [place]; [place] s/p stent    CORONARY ANGIOPLASTY     CORONARY/GRAFT ACUTE MI REVASCULARIZATION N/A 06/06/2021   Procedure: Coronary/Graft Acute MI Revascularization;  Surgeon: Wellington Hampshire, MD;  Location: [place] [facility];  Service: Cardiovascular;  Laterality: N/A;   ELECTROMAGNETIC NAVIGATION BROCHOSCOPY N/A 11/27/2016   Procedure: ELECTROMAGNETIC NAVIGATION BRONCHOSCOPY;  Surgeon: [name], MD;  Location: [facility];  Service: Cardiopulmonary;  Laterality: N/A;   LEFT HEART CATH AND CORONARY ANGIOGRAPHY N/A 06/06/2021   Procedure: LEFT HEART CATH AND CORONARY ANGIOGRAPHY;  Surgeon: Wellington Hampshire, MD;  Location: [place] [facility];  Service: Cardiovascular;  Laterality: N/A;    FAMILY HISTORY: Family History  Problem Relation Age of Onset   Heart attack Mother    Heart attack Father     ADVANCED DIRECTIVES (Y/N):  N  HEALTH MAINTENANCE: Social History   Tobacco Use   Smoking status: Former    Packs/day: 1.00  Types: Cigarettes    Quit date: 08/27/2011    Years since quitting: 10.6   Smokeless tobacco: Never  Vaping Use   Vaping Use: Never used  Substance Use Topics   Alcohol use: No   Drug use: No     Colonoscopy:  PAP:  Bone density:  Lipid panel:  Allergies  Allergen Reactions   Statins Other (See Comments)    Arthralagia    Current  Outpatient Medications  Medication Sig Dispense Refill   albuterol (PROVENTIL) (2.5 MG/3ML) 0.083% nebulizer solution SMARTSIG:3 Milliliter(s) Via Nebulizer Every 6 Hours PRN     albuterol (VENTOLIN HFA) 108 (90 Base) MCG/ACT inhaler Inhale into the lungs every 6 (six) hours as needed.     amiodarone (PACERONE) 200 MG tablet Take 1 tablet (200 mg total) by mouth 2 (two) times daily. (Patient taking differently: Take 100 mg by mouth daily.) 60 tablet 0   amLODipine (NORVASC) 5 MG tablet Take by mouth.     clotrimazole-betamethasone (LOTRISONE) cream Apply to affected area 2 times daily 15 g 1   enoxaparin (LOVENOX) 100 MG/ML injection Inject 0.975 mLs (97.5 mg total) into the skin daily. 29.25 mL 0   guaiFENesin (MUCINEX) 600 MG 12 hr tablet Take 600 mg by mouth 2 (two) times daily as needed for cough or to loosen phlegm.     losartan-hydrochlorothiazide (HYZAAR) 50-12.5 MG tablet Take 1 tablet by mouth daily.     mirtazapine (REMERON) 7.5 MG tablet Take by mouth.     naproxen sodium (ALEVE) 220 MG tablet Take 220 mg by mouth 2 (two) times daily with a meal.     potassium chloride SA (KLOR-CON M) 20 MEQ tablet Take 1 tablet (20 mEq total) by mouth daily. 30 tablet 3   rivaroxaban (XARELTO) 20 MG TABS tablet Take 1 tablet (20 mg total) by mouth daily with supper. 30 tablet 5   rosuvastatin (CRESTOR) 5 MG tablet Take 2.5 mg by mouth daily.     torsemide (DEMADEX) 20 MG tablet Take 1 tablet (20 mg total) by mouth daily. 30 tablet 0   traMADol (ULTRAM) 50 MG tablet Take 1 tablet (50 mg total) by mouth 2 (two) times daily. As needed for pain. 60 tablet 0   No current facility-administered medications for this visit.    OBJECTIVE: Vitals:   04/05/22 0853  BP: 110/67  Pulse: 98  Resp: 20  Temp: (!) 97.5 F (36.4 C)  SpO2: 100%     Body mass index is 17.41 kg/m.    ECOG FS:0 - Asymptomatic  General: Thin, no acute distress. Eyes: Pink conjunctiva, anicteric sclera. HEENT: Normocephalic,  moist mucous membranes. Lungs: No audible wheezing or coughing. Heart: Regular rate and rhythm. Abdomen: Soft, nontender, no obvious distention. Musculoskeletal: No edema, cyanosis, or clubbing. Neuro: Alert, answering all questions appropriately. Cranial nerves grossly intact. Skin: No rashes or petechiae noted. Psych: Normal affect.   LAB RESULTS:  Lab Results  Component Value Date   NA 129 (L) 04/05/2022   K 4.5 04/05/2022   CL 96 (L) 04/05/2022   CO2 21 (L) 04/05/2022   GLUCOSE 193 (H) 04/05/2022   BUN 18 04/05/2022   CREATININE 1.13 04/05/2022   CALCIUM 8.6 (L) 04/05/2022   PROT 7.2 04/05/2022   ALBUMIN 3.8 04/05/2022   AST 37 04/05/2022   ALT 42 04/05/2022   ALKPHOS 363 (H) 04/05/2022   BILITOT 1.1 04/05/2022   GFRNONAA >60 04/05/2022   GFRAA >60 03/15/2020    Lab Results  Component  Value Date   WBC 13.4 (H) 04/05/2022   NEUTROABS 10.5 (H) 04/05/2022   HGB 14.9 04/05/2022   HCT 44.6 04/05/2022   MCV 83.1 04/05/2022   PLT 219 04/05/2022     STUDIES: ECHOCARDIOGRAM COMPLETE  Result Date: 03/24/2022    ECHOCARDIOGRAM REPORT   Patient Name:   ARSHDEEP BOLGER Date of Exam: 03/24/2022 Medical Rec #:  [id number]       Height:       71.0 in Accession #:    [id number]      Weight:       128.1 lb Date of Birth:  [birth year]       BSA:          1.745 m Patient Age:    29 years        BP:           156/81 mmHg Patient Gender: M               HR:           82 bpm. Exam Location:  [facility] Procedure: 2D Echo Indications:     Pulmonary Embolus I26.09  History:         Patient has prior history of Echocardiogram examinations, most                  recent 06/06/2021.  Sonographer:     Kathlen Brunswick RDCS Referring Phys:  [place] Diagnosing Phys: [place]  1. Left ventricular ejection fraction, by estimation, is 45 to 50%. The left ventricle has mildly decreased function. The left ventricle demonstrates global hypokinesis. There is mild concentric left  ventricular hypertrophy. Left ventricular diastolic parameters are consistent with Grade I diastolic dysfunction (impaired relaxation).  2. Right ventricular systolic function is normal. The right ventricular size is normal.  3. Left atrial size was mildly dilated.  4. Right atrial size was mildly dilated.  5. The mitral valve is normal in structure. Trivial mitral valve regurgitation. No evidence of mitral stenosis.  6. The aortic valve is normal in structure. Aortic valve regurgitation is mild to moderate. Aortic valve sclerosis is present, with no evidence of aortic valve stenosis.  7. The inferior vena cava is normal in size with greater than 50% respiratory variability, suggesting right atrial pressure of 3 mmHg. FINDINGS  Left Ventricle: Left ventricular ejection fraction, by estimation, is 45 to 50%. The left ventricle has mildly decreased function. The left ventricle demonstrates global hypokinesis. The left ventricular internal cavity size was normal in size. There is  mild concentric left ventricular hypertrophy. Left ventricular diastolic parameters are consistent with Grade I diastolic dysfunction (impaired relaxation). Right Ventricle: The right ventricular size is normal. No increase in right ventricular wall thickness. Right ventricular systolic function is normal. Left Atrium: Left atrial size was mildly dilated. Right Atrium: Right atrial size was mildly dilated. Pericardium: There is no evidence of pericardial effusion. Mitral Valve: The mitral valve is normal in structure. Trivial mitral valve regurgitation. No evidence of mitral valve stenosis. Tricuspid Valve: The tricuspid valve is normal in structure. Tricuspid valve regurgitation is trivial. No evidence of tricuspid stenosis. Aortic Valve: The aortic valve is normal in structure. Aortic valve regurgitation is mild to moderate. Aortic regurgitation PHT measures 508 msec. Aortic valve sclerosis is present, with no evidence of aortic valve  stenosis. Aortic valve peak gradient measures 6.8 mmHg. Pulmonic Valve: The pulmonic valve was normal in structure. Pulmonic valve regurgitation is trivial. No evidence of  pulmonic stenosis. Aorta: The aortic root is normal in size and structure. Venous: The inferior vena cava is normal in size with greater than 50% respiratory variability, suggesting right atrial pressure of 3 mmHg. IAS/Shunts: No atrial level shunt detected by color flow Doppler.  LEFT VENTRICLE PLAX 2D LVIDd:         3.56 cm     Diastology LVIDs:         2.77 cm     LV e' medial:    3.81 cm/s LV PW:         1.00 cm     LV E/e' medial:  12.3 LV IVS:        0.78 cm     LV e' lateral:   3.92 cm/s LVOT diam:     1.90 cm     LV E/e' lateral: 11.9 LV SV:         44 LV SV Index:   25 LVOT Area:     2.84 cm  LV Volumes (MOD) LV vol d, MOD A2C: 39.6 ml LV vol d, MOD A4C: 84.2 ml LV vol s, MOD A2C: 22.0 ml LV vol s, MOD A4C: 42.0 ml LV SV MOD A2C:     17.6 ml LV SV MOD A4C:     84.2 ml LV SV MOD BP:      29.5 ml RIGHT VENTRICLE RV Basal diam:  2.50 cm RV S prime:     12.00 cm/s TAPSE (M-mode): 2.4 cm LEFT ATRIUM             Index        RIGHT ATRIUM          Index LA diam:        2.50 cm 1.43 cm/m   RA Area:     7.92 cm LA Vol (A2C):   35.1 ml 20.12 ml/m  RA Volume:   11.50 ml 6.59 ml/m LA Vol (A4C):   22.3 ml 12.78 ml/m LA Biplane Vol: 29.0 ml 16.62 ml/m  AORTIC VALVE                 PULMONIC VALVE AV Area (Vmax): 1.72 cm     PV Vmax:       0.98 m/s AV Vmax:        130.00 cm/s  PV Peak grad:  3.8 mmHg AV Peak Grad:   6.8 mmHg LVOT Vmax:      79.00 cm/s LVOT Vmean:     55.300 cm/s LVOT VTI:       0.154 m AI PHT:         508 msec  AORTA Ao Root diam: 2.90 cm MITRAL VALVE MV Area (PHT): 4.77 cm    SHUNTS MV Decel Time: 159 msec    Systemic VTI:  0.15 m MV E velocity: 46.70 cm/s  Systemic Diam: 1.90 cm MV A velocity: 67.70 cm/s MV E/A ratio:  0.69 Shaukat Khan Electronically signed by Neoma Laming Signature Date/Time: 03/24/2022/5:13:51 PM    Final     US Venous Img Lower Bilateral (DVT)  Result Date: 03/24/2022 CLINICAL DATA:  Lower extremity edema EXAM: BILATERAL LOWER EXTREMITY VENOUS DOPPLER ULTRASOUND TECHNIQUE: Gray-scale sonography with graded compression, as well as color Doppler and duplex ultrasound were performed to evaluate the lower extremity deep venous systems from the level of the common femoral vein and including the common femoral, femoral, profunda femoral, popliteal and calf veins including the posterior tibial, peroneal and gastrocnemius veins when visible. Spectral Doppler  was utilized to evaluate flow at rest and with distal augmentation maneuvers in the common femoral, femoral and popliteal veins. COMPARISON:  None Available. FINDINGS: RIGHT LOWER EXTREMITY Common Femoral Vein: No evidence of thrombus. Normal compressibility, respiratory phasicity and response to augmentation. Saphenofemoral Junction: No evidence of thrombus. Normal compressibility and flow on color Doppler imaging. Profunda Femoral Vein: No evidence of thrombus. Normal compressibility and flow on color Doppler imaging. Femoral Vein: No evidence of thrombus. Normal compressibility, respiratory phasicity and response to augmentation. Popliteal Vein: No evidence of thrombus. Normal compressibility, respiratory phasicity and response to augmentation. Calf Veins: No evidence of thrombus. Normal compressibility and flow on color Doppler imaging. LEFT LOWER EXTREMITY Common Femoral Vein: No evidence of thrombus. Normal compressibility, respiratory phasicity and response to augmentation. Saphenofemoral Junction: No evidence of thrombus. Normal compressibility and flow on color Doppler imaging. Profunda Femoral Vein: No evidence of thrombus. Normal compressibility and flow on color Doppler imaging. Femoral Vein: No evidence of thrombus. Normal compressibility, respiratory phasicity and response to augmentation. Popliteal Vein: No evidence of thrombus. Normal compressibility,  respiratory phasicity and response to augmentation. Calf Veins: No evidence of thrombus. Normal compressibility and flow on color Doppler imaging. IMPRESSION: No evidence of deep venous thrombosis in either lower extremity. Electronically Signed   By: Jerilynn Mages.  Shick M.D.   On: 03/24/2022 16:15   CT Angio Chest PE W and/or Wo Contrast  Result Date: 03/23/2022 CLINICAL DATA:  Chest pain, pulmonary embolism suspected EXAM: CT ANGIOGRAPHY CHEST WITH CONTRAST TECHNIQUE: Multidetector CT imaging of the chest was performed using the standard protocol during bolus administration of intravenous contrast. Multiplanar CT image reconstructions and MIPs were obtained to evaluate the vascular anatomy. RADIATION DOSE REDUCTION: This exam was performed according to the departmental dose-optimization program which includes automated exposure control, adjustment of the mA and/or kV according to patient size and/or use of iterative reconstruction technique. CONTRAST:  70m OMNIPAQUE IOHEXOL 350 MG/ML SOLN COMPARISON:  02/26/2022 CT chest abdomen pelvis. FINDINGS: Cardiovascular: Satisfactory opacification of the pulmonary arteries to the segmental level. Thrombus is noted in a right lower lobe posterior segment artery (series 5, image 286). Evidence of right heart strain with an RV:LV ratio of 1.13 and some reflux of contrast into the hepatic veins, although the appearance of the right ventricle appears similar to 02/26/2022. Chronic stenosis of some left upper lobe pulmonary arteries (series 5, image 174), related to chronic collapse of the posterior left upper lobe, which also demonstrates radiation fiducials. Normal heart size. Thrombus in the left atrial appendage (series 5, image 196), which is new compared to 02/26/2022. No pericardial effusion. Mediastinum/Nodes: No enlarged mediastinal, hilar, or axillary lymph nodes. Thyroid gland, trachea, and esophagus demonstrate no significant findings. Lungs/Pleura: Redemonstrated  innumerable pulmonary nodules, which do seem to have increased in number consistent with metastatic disease. Redemonstrated chronic collapse of a portion of the posterior left upper lobe with radiation fiducials. Upper Abdomen: No acute abnormality. Musculoskeletal: Review of the MIP images confirms the above findings. IMPRESSION: 1. Positive for acute PE in a right lower lobe posterior segment artery with CT evidence of right heart strain (RV/LV Ratio = 1.13) consistent with at least submassive (intermediate risk) PE. The presence of right heart strain has been associated with an increased risk of morbidity and mortality. Please refer to the "Code PE Focused" order set in EPIC. Given the small amount of actual pulmonary thrombus and a similar appearance of the right ventricle on the 02/26/2022 CT, this may be chronic and in part  related to stenosis of the left upper lobe pulmonary arteries, secondary to postradiation changes in the left lung. 2. Thrombus in the left atrial appendage, which is new compared to 02/26/2022. 3. Innumerable pulmonary nodules, which have increased in number compared to 02/26/2022, consistent with metastatic disease. These results were called by telephone at the time of interpretation on 03/23/2022 at 12:28 pm to provider [facility] , who verbally acknowledged these results. Electronically Signed   By: Merilyn Baba M.D.   On: 03/23/2022 12:31   DG Chest Portable 1 View  Result Date: 03/23/2022 CLINICAL DATA:  Chest pain. EXAM: PORTABLE CHEST 1 VIEW COMPARISON:  Prior chest x-ray on 08/01/2021 and CT of the chest on 02/26/2022 FINDINGS: Normal heart size. Stable emphysematous lung disease and again suggestion of multiple bilateral pulmonary nodules as depicted by recent CT. Stable chronic collapse of the medial left upper lobe. No pneumothorax, overt pulmonary edema or pleural fluid identified. IMPRESSION: Stable emphysematous lung disease, multiple bilateral pulmonary nodules and  chronic collapse of the medial left upper lobe. Electronically Signed   By: Aletta Edouard M.D.   On: 03/23/2022 10:40     ONCOLOGY HISTORY:  Biopsy of lung nodule on November 27, 2016 revealed adenocarcinoma, but there was no invasive component on the sample. Biopsy of mediastinal lymph node had insufficient material. Patient ultimately declined any further biopsies or surgery and elected to proceed with XRT alone which he completed in April 2018.  PET scan on January 22, 2019 reviewed independently with increased metabolic activity prior to previous with SUV increasing from 6.5 up to 8.8.  Patient declined intervention at that time and elected to proceed with simple observation.  Repeat CT scan on April 21, 2019 reviewed independently with progressive 6.4 cm mass in the left upper lobe.  Patient declined systemic treatment, but agreed to proceed with XRT which she completed in approximately August 2020.  CT scan on September 23, 2019 reviewed independently with no obvious evidence of recurrent or progressive disease.   ASSESSMENT: Progressive adenocarcinoma of upper lobe of left lung   PLAN:   1.  Progressive adenocarcinoma of upper lobe of left lung: PET scan results from August 17, 2021 reviewed independently with interval continued progression of bony metastatic disease and a new hypermetabolic bone lesion in the right sacrum.  Bilateral pulmonary nodules were stable, but concerning for metastatic involvement.  At that time we discussed systemic treatment, but patient and his wife elected for continued active surveillance.  Previously he stated he would not want aggressive chemotherapy but would consider immunotherapy if needed despite PD-L1 being unknown.  He r repeatedly stated that he does not want aggressive chemotherapy.  CT scan on Feb 26, 2022 and again on March 23, 2022 revealed minimally progressive disease, but patient wishes to continue single agent Keytruda with the idea that it is slowing the  growth of his malignancy, but not resolving it.  Patient once again declines treatment today, therefore will return to clinic in 1 week for further evaluation and reconsideration of cycle 6.  Patient also receives Zometa on odd-numbered cycles. 2.  Shoulder/back pain: Patient does not complain of this today.  Previously, patient reported this has been evident for years/decades.  Continue tramadol as needed.   3.  Shortness of breath/CHF: Patient does not complain of this today.  Follow-up with cardiology as needed. 4.  Bony disease: Patient receives Zometa on odd-numbered cycles as above.   5.  Leukocytosis: Chronic and unchanged.   6.  Hyponatremia: Sodium slightly worse today at 129. 7.  Hypokalemia: Resolved.  He has been instructed to only take his oral supplementation when he takes his diuretic.   8.  Foot pain/neuropathy: X-ray from February 09, 2022 revealed joint osteoarthritis.   9.  Poor appetite/weight loss: Improved.  Continue Megace.  10.  Pulmonary embolism: Patient was on Eliquis at the time of his PE and states that he did not miss any doses, therefore we will have to consider him an Eliquis failure.  He has been transitioned to Xarelto.  11.  Weakness and fatigue: Last week, patient was given referrals for home health, home palliative care, as well as the [facility] program.  Delay treatment as above.  Patient instead will receive 1 L IV fluids.   Patient expressed understanding and was in agreement with this plan. He also understands that He can call clinic at any time with any questions, concerns, or complaints.     [name], MD   04/05/2022 4:59 PM

## 2022-03-30 NOTE — Telephone Encounter (Signed)
Ret'd call to patient's wife Stanton Kidney, discussed the Palliative referral/services with her and all questions were answered and verbal consent was rec'd to begin services with patient.  Wife requested that I contact her daughter Jacqlyn Larsen to schedule the visit because she would like for her to be there, told wife to let Jacqlyn Larsen know that I will be calling her on Mon. 04/02/22 to schedule.

## 2022-04-02 ENCOUNTER — Telehealth: Payer: Self-pay

## 2022-04-02 ENCOUNTER — Telehealth: Payer: Self-pay | Admitting: Nurse Practitioner

## 2022-04-02 ENCOUNTER — Other Ambulatory Visit: Payer: Self-pay | Admitting: Oncology

## 2022-04-02 DIAGNOSIS — R5383 Other fatigue: Secondary | ICD-10-CM | POA: Diagnosis not present

## 2022-04-02 DIAGNOSIS — E538 Deficiency of other specified B group vitamins: Secondary | ICD-10-CM | POA: Diagnosis not present

## 2022-04-02 DIAGNOSIS — R5381 Other malaise: Secondary | ICD-10-CM | POA: Diagnosis not present

## 2022-04-02 MED ORDER — RIVAROXABAN 20 MG PO TABS: 20.0000 mg | ORAL_TABLET | Freq: Every day | ORAL | 5 refills | Status: DC

## 2022-04-02 NOTE — Telephone Encounter (Signed)
Called and spoke with patient's daughter Jacqlyn Larsen (as requested by patient's wife) and have scheduled a Zoom Palliative Consult for 04/05/22 @ 2:30 PM.

## 2022-04-02 NOTE — Telephone Encounter (Signed)
-----   Message from [name], MD sent at 04/02/2022 12:24 PM EDT ----- I discussed patient's anticoagulation with Dr. Clayborn Bigness as well as pharmacy.  Both are okay with switching him to oral Xarelto 20 mg daily and discontinuing Lovenox.  Patient should have already taken Lovenox today.  He should also start Xarelto today.  Take both medications began tomorrow and then discontinue Lovenox on Wednesday. (In short, he needs both Lovenox and Xarelto for 2 straight days, then he can discontinue Lovenox.)  Patient may have a slight increased risk of bleeding during the transition of medications.  Nothing to do about that, just to be aware.  Xarelto has been called in to [facility].

## 2022-04-02 NOTE — Telephone Encounter (Signed)
Per Daughter, Dr. Clayborn Bigness stopped lovenox and started patient on the xarelto starter pack and will increase to xarelto 20 mg after 7 days. Dr. Grayland Ormond is aware and stated to proceed with Dr. Etta Quill plan.

## 2022-04-05 ENCOUNTER — Other Ambulatory Visit: Payer: Medicare HMO | Admitting: Nurse Practitioner

## 2022-04-05 ENCOUNTER — Inpatient Hospital Stay (HOSPITAL_BASED_OUTPATIENT_CLINIC_OR_DEPARTMENT_OTHER): Payer: Medicare HMO | Admitting: Oncology

## 2022-04-05 ENCOUNTER — Encounter: Payer: Self-pay | Admitting: Oncology

## 2022-04-05 ENCOUNTER — Inpatient Hospital Stay: Payer: Medicare HMO

## 2022-04-05 VITALS — BP 110/67 | HR 98 | Temp 97.5°F | Resp 20 | Ht 71.0 in

## 2022-04-05 VITALS — BP 162/70 | HR 84

## 2022-04-05 DIAGNOSIS — M19079 Primary osteoarthritis, unspecified ankle and foot: Secondary | ICD-10-CM | POA: Diagnosis not present

## 2022-04-05 DIAGNOSIS — C3412 Malignant neoplasm of upper lobe, left bronchus or lung: Secondary | ICD-10-CM

## 2022-04-05 DIAGNOSIS — R634 Abnormal weight loss: Secondary | ICD-10-CM | POA: Diagnosis not present

## 2022-04-05 DIAGNOSIS — R630 Anorexia: Secondary | ICD-10-CM | POA: Diagnosis not present

## 2022-04-05 DIAGNOSIS — E871 Hypo-osmolality and hyponatremia: Secondary | ICD-10-CM | POA: Diagnosis not present

## 2022-04-05 DIAGNOSIS — I2699 Other pulmonary embolism without acute cor pulmonale: Secondary | ICD-10-CM | POA: Diagnosis not present

## 2022-04-05 DIAGNOSIS — D72829 Elevated white blood cell count, unspecified: Secondary | ICD-10-CM | POA: Diagnosis not present

## 2022-04-05 DIAGNOSIS — R531 Weakness: Secondary | ICD-10-CM | POA: Diagnosis not present

## 2022-04-05 DIAGNOSIS — C7951 Secondary malignant neoplasm of bone: Secondary | ICD-10-CM | POA: Diagnosis not present

## 2022-04-05 LAB — COMPREHENSIVE METABOLIC PANEL
ALT: 42 U/L (ref 0–44)
AST: 37 U/L (ref 15–41)
Albumin: 3.8 g/dL (ref 3.5–5.0)
Alkaline Phosphatase: 363 U/L — ABNORMAL HIGH (ref 38–126)
Anion gap: 12 (ref 5–15)
BUN: 18 mg/dL (ref 8–23)
CO2: 21 mmol/L — ABNORMAL LOW (ref 22–32)
Calcium: 8.6 mg/dL — ABNORMAL LOW (ref 8.9–10.3)
Chloride: 96 mmol/L — ABNORMAL LOW (ref 98–111)
Creatinine, Ser: 1.13 mg/dL (ref 0.61–1.24)
GFR, Estimated: >60 mL/min (ref >60)
Glucose, Bld: 193 mg/dL — ABNORMAL HIGH (ref 70–99)
Potassium: 4.5 mmol/L (ref 3.5–5.1)
Sodium: 129 mmol/L — ABNORMAL LOW (ref 135–145)
Total Bilirubin: 1.1 mg/dL (ref 0.3–1.2)
Total Protein: 7.2 g/dL (ref 6.5–8.1)

## 2022-04-05 LAB — CBC WITH DIFFERENTIAL/PLATELET
Abs Immature Granulocytes: 0.16 10*3/uL — ABNORMAL HIGH (ref 0.00–0.07)
Basophils Absolute: 0.1 10*3/uL (ref 0.0–0.1)
Basophils Relative: 1 %
Eosinophils Absolute: 0.6 10*3/uL — ABNORMAL HIGH (ref 0.0–0.5)
Eosinophils Relative: 4 %
HCT: 44.6 % (ref 39.0–52.0)
Hemoglobin: 14.9 g/dL (ref 13.0–17.0)
Immature Granulocytes: 1 %
Lymphocytes Relative: 8 %
Lymphs Abs: 1.1 10*3/uL (ref 0.7–4.0)
MCH: 27.7 pg (ref 26.0–34.0)
MCHC: 33.4 g/dL (ref 30.0–36.0)
MCV: 83.1 fL (ref 80.0–100.0)
Monocytes Absolute: 1 10*3/uL (ref 0.1–1.0)
Monocytes Relative: 8 %
Neutro Abs: 10.5 10*3/uL — ABNORMAL HIGH (ref 1.7–7.7)
Neutrophils Relative %: 78 %
Platelets: 219 10*3/uL (ref 150–400)
RBC: 5.37 MIL/uL (ref 4.22–5.81)
RDW: 15.9 % — ABNORMAL HIGH (ref 11.5–15.5)
WBC: 13.4 10*3/uL — ABNORMAL HIGH (ref 4.0–10.5)
nRBC: 0 % (ref 0.0–0.2)

## 2022-04-05 LAB — TSH: TSH: 2.472 u[IU]/mL (ref 0.350–4.500)

## 2022-04-05 MED ORDER — SODIUM CHLORIDE 0.9 % IV SOLN
Freq: Once | INTRAVENOUS | Status: AC
  Filled 2022-04-05: qty 250

## 2022-04-06 DIAGNOSIS — E538 Deficiency of other specified B group vitamins: Secondary | ICD-10-CM | POA: Diagnosis not present

## 2022-04-06 LAB — T4: T4, Total: 10.3 ug/dL (ref 4.5–12.0)

## 2022-04-06 NOTE — Progress Notes (Addendum)
[place]  [phone] [phone]  ID: Scott Gross OB: [birth year]  MR#: [id number]  EHO#:[id number]  Patient Care Team: [name], MD as PCP - General (Family Medicine) [name], MD as Consulting Physician (Oncology)   CHIEF COMPLAINT: Progressive adenocarcinoma of upper lobe of left lung.  INTERVAL HISTORY: Patient returns to clinic today for further evaluation and consideration of additional Keytruda.  He continues to become progressively weak and fatigued and has performance status is declining.  He has a poor appetite and has lost several pounds in the interim. He does not complain of back pain today.  He has no neurologic complaints. He denies any chest pain, shortness of breath, cough, or hemoptysis.  He has no nausea, vomiting, constipation, or diarrhea. He has no urinary complaints.  Patient offers no further specific complaints today.  REVIEW OF SYSTEMS:   Review of Systems  Constitutional:  Positive for fever and malaise/fatigue. Negative for weight loss.  Respiratory: Negative.  Negative for cough, hemoptysis and shortness of breath.   Cardiovascular: Negative.  Negative for chest pain and leg swelling.  Gastrointestinal: Negative.  Negative for abdominal pain.  Genitourinary: Negative.  Negative for dysuria.  Musculoskeletal: Negative.  Negative for back pain and joint pain.  Skin: Negative.  Negative for rash.  Neurological:  Positive for weakness. Negative for sensory change, focal weakness and headaches.  Psychiatric/Behavioral: Negative.  The patient is not nervous/anxious.     As per HPI. Otherwise, a complete review of systems is negative.  PAST MEDICAL HISTORY: Past Medical History:  Diagnosis Date   Arthritis    CAD (coronary artery disease) Nov. 12, 2012   Inferior ST elevation MI in November of 2012 with ventricular fibrillation arrest. Status post drug-eluting stent placement to the mid LAD. Most  recent cardiac catheterization in June of this year showed patent stent with minimal restenosis, 75% proximal LAD and 80% distal LAD which were unchanged from before. Mild disease in the left circumflex and moderate RCA disease. Ejection fraction was 55%.   Cancer of upper lobe of left lung ([place]) 11/2016   Rad tx's.   Cardiac arrest [facility]) Nov. 2012   x 86    Cardiac arrest - ventricular fibrillation    HOH (hard of hearing)    Left Hearing Aid   Hyperlipidemia    Hypertension    Post PTCA 2013   x2   ST elevation MI (STEMI) ([place]) 08/26/2011    PAST SURGICAL HISTORY: Past Surgical History:  Procedure Laterality Date   CARDIAC CATHETERIZATION  2013   @ [place]; [place] s/p stent    CORONARY ANGIOPLASTY     CORONARY/GRAFT ACUTE MI REVASCULARIZATION N/A 06/06/2021   Procedure: Coronary/Graft Acute MI Revascularization;  Surgeon: Wellington Hampshire, MD;  Location: [place] [facility];  Service: Cardiovascular;  Laterality: N/A;   ELECTROMAGNETIC NAVIGATION BROCHOSCOPY N/A 11/27/2016   Procedure: ELECTROMAGNETIC NAVIGATION BRONCHOSCOPY;  Surgeon: [name], MD;  Location: [facility] OR[facility];  Service: Cardiopulmonary;  Laterality: N/A;   LEFT HEART CATH AND CORONARY ANGIOGRAPHY N/A 06/06/2021   Procedure: LEFT HEART CATH AND CORONARY ANGIOGRAPHY;  Surgeon: Wellington Hampshire, MD;  Location: [place] [facility];  Service: Cardiovascular;  Laterality: N/A;    FAMILY HISTORY: Family History  Problem Relation Age of Onset   Heart attack Mother    Heart attack Father     ADVANCED DIRECTIVES (Y/N):  N  HEALTH MAINTENANCE: Social History   Tobacco Use   Smoking status: Former  Packs/day: 1.00    Types: Cigarettes    Quit date: 08/27/2011    Years since quitting: 10.6   Smokeless tobacco: Never  Vaping Use   Vaping Use: Never used  Substance Use Topics   Alcohol use: No   Drug use: No     Colonoscopy:  PAP:  Bone density:  Lipid panel:  Allergies  Allergen Reactions   Statins  Other (See Comments)    Arthralagia    No current facility-administered medications for this visit.   No current outpatient medications on file.   Facility-Administered Medications Ordered in Other Visits  Medication Dose Route Frequency Provider Last Rate Last Admin   0.9 %  sodium chloride infusion   Intravenous Continuous Lovick, Montel Culver, MD       acetaminophen (TYLENOL) tablet 1,000 mg  1,000 mg Oral Q6H Lovick, Montel Culver, MD       amiodarone (PACERONE) tablet 100 mg  100 mg Oral Daily Jesusita Oka, MD       docusate sodium (COLACE) capsule 100 mg  100 mg Oral BID Kinsinger, Arta Bruce, MD       hydrALAZINE (APRESOLINE) injection 10 mg  10 mg Intravenous Q2H PRN Kinsinger, Arta Bruce, MD       methocarbamol (ROBAXIN) tablet 750 mg  750 mg Oral QID Jesusita Oka, MD       metoprolol tartrate (LOPRESSOR) injection 5 mg  5 mg Intravenous Q6H PRN Kinsinger, Arta Bruce, MD       morphine (PF) 2 MG/ML injection 2-4 mg  2-4 mg Intravenous Q1H PRN Kinsinger, Arta Bruce, MD       ondansetron (ZOFRAN-ODT) disintegrating tablet 4 mg  4 mg Oral Q6H PRN Kinsinger, Arta Bruce, MD       Or   ondansetron [facility]) injection 4 mg  4 mg Intravenous Q6H PRN Kinsinger, Arta Bruce, MD       Oral care mouth rinse  15 mL Mouth Rinse PRN Kinsinger, Arta Bruce, MD       oxyCODONE (Oxy IR/ROXICODONE) immediate release tablet 5 mg  5 mg Oral Q4H PRN Kinsinger, Arta Bruce, MD       potassium chloride SA (KLOR-CON M) CR tablet 20 mEq  20 mEq Oral Daily Jesusita Oka, MD        OBJECTIVE: Vitals:   04/12/22 1100  BP: 113/63  Pulse: 98  Resp: 16  Temp: 97.9 F (36.6 C)  SpO2: 99%     Body mass index is 17.1 kg/m.    ECOG FS:3 - Symptomatic, >50% confined to bed  General: Thin, no acute distress. Eyes: Pink conjunctiva, anicteric sclera. HEENT: Normocephalic, moist mucous membranes. Lungs: No audible wheezing or coughing. Heart: Regular rate and rhythm. Abdomen: Soft, nontender, no obvious  distention. Musculoskeletal: No edema, cyanosis, or clubbing. Neuro: Alert. Cranial nerves grossly intact. Skin: No rashes or petechiae noted. Psych: Normal affect.    LAB RESULTS:  Lab Results  Component Value Date   NA 133 (L) 04/13/2022   K 4.0 04/13/2022   CL 102 04/13/2022   CO2 18 (L) 04/13/2022   GLUCOSE 104 (H) 04/13/2022   BUN 14 04/13/2022   CREATININE 0.88 04/13/2022   CALCIUM 7.8 (L) 04/13/2022   PROT 6.6 04/12/2022   ALBUMIN 3.6 04/12/2022   AST 39 04/12/2022   ALT 38 04/12/2022   ALKPHOS 342 (H) 04/12/2022   BILITOT 1.0 04/12/2022   GFRNONAA >60 04/13/2022   GFRAA >60 03/15/2020    Lab Results  Component Value Date   WBC 12.1 (H) 04/13/2022   NEUTROABS 9.0 (H) 04/12/2022   HGB 11.7 (L) 04/13/2022   HCT 33.8 (L) 04/13/2022   MCV 82.2 04/13/2022   PLT 120 (L) 04/13/2022     STUDIES: DG CHEST PORT 1 VIEW  Result Date: 04/12/2022 CLINICAL DATA:  [id number] EXAM: PORTABLE CHEST 1 VIEW COMPARISON:  04/12/2022 FINDINGS: Soft tissue mass again demonstrated in the left mediastinal/suprahilar region with postoperative changes correlating to treated neoplasm. Diffuse nodular parenchymal opacities in the lungs consistent with known pulmonary nodules. No developing consolidation or infiltration. No pleural effusions. No pneumothorax. Normal heart size. IMPRESSION: Left mediastinal/suprahilar mass correlating to known treated neoplasm. Multiple pulmonary nodules. No change since earlier today. Electronically Signed   By: Lucienne Capers M.D.   On: 04/12/2022 23:35   CT Head Wo Contrast  Result Date: 04/12/2022 CLINICAL DATA:  Fall. Facial trauma, blunt; Chest trauma, blunt; Neck trauma (Age >= 65y). Left flank bruising. Metastatic lung cancer. EXAM: CT HEAD WITHOUT CONTRAST CT CERVICAL SPINE WITHOUT CONTRAST CT CHEST, ABDOMEN AND PELVIS WITH CONTRAST TECHNIQUE: Contiguous axial images were obtained from the base of the skull through the vertex without intravenous contrast.  Multidetector CT imaging of the cervical spine was performed without intravenous contrast. Multiplanar CT image reconstructions were also generated. Multidetector CT imaging of the chest, abdomen and pelvis was performed following the standard protocol during bolus administration of intravenous contrast. RADIATION DOSE REDUCTION: This exam was performed according to the departmental dose-optimization program which includes automated exposure control, adjustment of the mA and/or kV according to patient size and/or use of iterative reconstruction technique. CONTRAST:  51m OMNIPAQUE IOHEXOL 350 MG/ML SOLN COMPARISON:  CT chest 03/23/2022, CT chest abdomen pelvis of 02/26/2022 FINDINGS: CT HEAD FINDINGS Brain: Normal anatomic configuration. Parenchymal volume loss is commensurate with the patient's age. Mild periventricular white matter changes are present likely reflecting the sequela of small vessel ischemia. Remote lacunar infarcts are seen within the right insular cortex, right lentiform nucleus, and right cerebellum. No abnormal intra or extra-axial mass lesion or fluid collection. No abnormal mass effect or midline shift. No evidence of acute intracranial hemorrhage or infarct. Ventricular size is normal. Cerebellum unremarkable. Vascular: No asymmetric hyperdense vasculature at the skull base. Skull: Intact Sinuses/Orbits: Paranasal sinuses are clear. Ocular lenses have been removed. Orbits are otherwise unremarkable. Other: Mastoid air cells and middle ear cavities are clear. CT CERVICAL FINDINGS Alignment: 2 mm anterolisthesis C7-T1 is likely degenerative in nature. Otherwise normal alignment. Skull base and vertebrae: Craniocervical alignment is normal. The atlantodental interval is not widened. No acute fracture of the cervical spine. Scattered sclerotic lesions are seen throughout the cervical spine in keeping with osseous metastatic disease. No pathologic fracture or significant focus of osteolysis is  noted. Circumscribed lytic lesion within the C6 vertebral body superiorly is in keeping with a subchondral cyst related to degenerative disc disease. Soft tissues and spinal canal: No prevertebral fluid or swelling. No visible canal hematoma. Disc levels: There is intervertebral disc space narrowing and endplate remodeling throughout the cervical spine in keeping with changes of moderate to severe degenerative disc disease, most severe at C5-C7. Multilevel uncovertebral arthrosis is present resulting in moderate neuroforaminal narrowing bilaterally at C5-6 and on the right at C4-5. Milder neuroforaminal narrowing noted bilaterally at C6-7. Other:  None CT CHEST FINDINGS Cardiovascular: Extensive coronary artery calcification with evidence of prior coronary artery stenting. Global cardiac size is within normal limits. No pericardial effusion. Central pulmonary arteries are of normal  caliber. Mild atherosclerotic calcification within the thoracic aorta. No aortic aneurysm. Mediastinum/Nodes: Visualized thyroid is unremarkable. No pathologic thoracic adenopathy. The esophagus is unremarkable. Lungs/Pleura: Chronic collapse and consolidation of the left upper lobe, likely representing post therapy effect, is unchanged. Fiducial markers are again seen within the consolidated left upper lobe. Innumerable pulmonary nodules demonstrating a miliary distribution throughout the aerated lungs bilaterally in keeping with intra pulmonary metastatic disease appears grossly stable since prior examination small left pleural effusion has developed. No pneumothorax. Musculoskeletal: Acute fractures of the left eleventh and twelfth ribs are seen posteriorly 11 fracture appears comminuted displaced with a tiny ossific fragment extending into the left subpleural space, best seen on image # 49/2. Widespread sclerotic metastases throughout the visualized axial skeleton appear grossly stable since prior examination no pathologic fracture.  Healing mid sternal fracture again noted. CT ABDOMEN PELVIS FINDINGS Hepatobiliary: Stable tiny cyst or hemangioma within the right hepatic lobe. Liver otherwise unremarkable. Gallbladder unremarkable. No intra or extrahepatic biliary ductal dilation. Pancreas: Unremarkable Spleen: There is a laceration of the upper pole the spleen measuring 3.5 cm in greatest dimension which does not extend to the splenic hilum. No active extravasation. No perisplenic hematoma or free fluid identified. This is best characterized as a grade III AAST splenic injury. Adrenals/Urinary Tract: Adrenal glands are unremarkable. Kidneys are normal, without renal calculi, focal lesion, or hydronephrosis. Bladder is unremarkable. Stomach/Bowel: Stomach is within normal limits. Appendix appears normal. No evidence of bowel wall thickening, distention, or inflammatory changes. Vascular/Lymphatic: Aortic atherosclerosis. No enlarged abdominal or pelvic lymph nodes. Reproductive: Mild prostatic enlargement is again noted. Other: No abdominal wall hernia. Musculoskeletal: Widespread sclerotic metastases are seen throughout the visualized axial skeleton of the abdomen and pelvis with prominent osteolysis in a stable associated soft tissue mass involving the left ilium measuring at least 3.1 x 4.8 cm at axial image # 97/2. Stable periosteal soft tissue mass involving the right iliac spine again noted measuring at least 3.5 x 5.8 cm at axial image # 85/2. IMPRESSION: 1. No acute intracranial abnormality. No calvarial fracture. 2. No acute fracture or listhesis of the cervical spine. 3. Acute fractures of the left eleventh and twelfth ribs posteriorly. Small left pleural effusion. No pneumothorax. 4. Grade III AAST splenic injury. No perisplenic hematoma or free fluid. No active extravasation. 5. Extensive pulmonary and osseous metastatic disease, stable since prior examination. Electronically Signed   By: Fidela Salisbury M.D.   On: 04/12/2022 20:05    CT Cervical Spine Wo Contrast  Result Date: 04/12/2022 CLINICAL DATA:  Fall. Facial trauma, blunt; Chest trauma, blunt; Neck trauma (Age >= 65y). Left flank bruising. Metastatic lung cancer. EXAM: CT HEAD WITHOUT CONTRAST CT CERVICAL SPINE WITHOUT CONTRAST CT CHEST, ABDOMEN AND PELVIS WITH CONTRAST TECHNIQUE: Contiguous axial images were obtained from the base of the skull through the vertex without intravenous contrast. Multidetector CT imaging of the cervical spine was performed without intravenous contrast. Multiplanar CT image reconstructions were also generated. Multidetector CT imaging of the chest, abdomen and pelvis was performed following the standard protocol during bolus administration of intravenous contrast. RADIATION DOSE REDUCTION: This exam was performed according to the departmental dose-optimization program which includes automated exposure control, adjustment of the mA and/or kV according to patient size and/or use of iterative reconstruction technique. CONTRAST:  64m OMNIPAQUE IOHEXOL 350 MG/ML SOLN COMPARISON:  CT chest 03/23/2022, CT chest abdomen pelvis of 02/26/2022 FINDINGS: CT HEAD FINDINGS Brain: Normal anatomic configuration. Parenchymal volume loss is commensurate with the patient's age. Mild  periventricular white matter changes are present likely reflecting the sequela of small vessel ischemia. Remote lacunar infarcts are seen within the right insular cortex, right lentiform nucleus, and right cerebellum. No abnormal intra or extra-axial mass lesion or fluid collection. No abnormal mass effect or midline shift. No evidence of acute intracranial hemorrhage or infarct. Ventricular size is normal. Cerebellum unremarkable. Vascular: No asymmetric hyperdense vasculature at the skull base. Skull: Intact Sinuses/Orbits: Paranasal sinuses are clear. Ocular lenses have been removed. Orbits are otherwise unremarkable. Other: Mastoid air cells and middle ear cavities are clear. CT CERVICAL  FINDINGS Alignment: 2 mm anterolisthesis C7-T1 is likely degenerative in nature. Otherwise normal alignment. Skull base and vertebrae: Craniocervical alignment is normal. The atlantodental interval is not widened. No acute fracture of the cervical spine. Scattered sclerotic lesions are seen throughout the cervical spine in keeping with osseous metastatic disease. No pathologic fracture or significant focus of osteolysis is noted. Circumscribed lytic lesion within the C6 vertebral body superiorly is in keeping with a subchondral cyst related to degenerative disc disease. Soft tissues and spinal canal: No prevertebral fluid or swelling. No visible canal hematoma. Disc levels: There is intervertebral disc space narrowing and endplate remodeling throughout the cervical spine in keeping with changes of moderate to severe degenerative disc disease, most severe at C5-C7. Multilevel uncovertebral arthrosis is present resulting in moderate neuroforaminal narrowing bilaterally at C5-6 and on the right at C4-5. Milder neuroforaminal narrowing noted bilaterally at C6-7. Other:  None CT CHEST FINDINGS Cardiovascular: Extensive coronary artery calcification with evidence of prior coronary artery stenting. Global cardiac size is within normal limits. No pericardial effusion. Central pulmonary arteries are of normal caliber. Mild atherosclerotic calcification within the thoracic aorta. No aortic aneurysm. Mediastinum/Nodes: Visualized thyroid is unremarkable. No pathologic thoracic adenopathy. The esophagus is unremarkable. Lungs/Pleura: Chronic collapse and consolidation of the left upper lobe, likely representing post therapy effect, is unchanged. Fiducial markers are again seen within the consolidated left upper lobe. Innumerable pulmonary nodules demonstrating a miliary distribution throughout the aerated lungs bilaterally in keeping with intra pulmonary metastatic disease appears grossly stable since prior examination small  left pleural effusion has developed. No pneumothorax. Musculoskeletal: Acute fractures of the left eleventh and twelfth ribs are seen posteriorly 11 fracture appears comminuted displaced with a tiny ossific fragment extending into the left subpleural space, best seen on image # 49/2. Widespread sclerotic metastases throughout the visualized axial skeleton appear grossly stable since prior examination no pathologic fracture. Healing mid sternal fracture again noted. CT ABDOMEN PELVIS FINDINGS Hepatobiliary: Stable tiny cyst or hemangioma within the right hepatic lobe. Liver otherwise unremarkable. Gallbladder unremarkable. No intra or extrahepatic biliary ductal dilation. Pancreas: Unremarkable Spleen: There is a laceration of the upper pole the spleen measuring 3.5 cm in greatest dimension which does not extend to the splenic hilum. No active extravasation. No perisplenic hematoma or free fluid identified. This is best characterized as a grade III AAST splenic injury. Adrenals/Urinary Tract: Adrenal glands are unremarkable. Kidneys are normal, without renal calculi, focal lesion, or hydronephrosis. Bladder is unremarkable. Stomach/Bowel: Stomach is within normal limits. Appendix appears normal. No evidence of bowel wall thickening, distention, or inflammatory changes. Vascular/Lymphatic: Aortic atherosclerosis. No enlarged abdominal or pelvic lymph nodes. Reproductive: Mild prostatic enlargement is again noted. Other: No abdominal wall hernia. Musculoskeletal: Widespread sclerotic metastases are seen throughout the visualized axial skeleton of the abdomen and pelvis with prominent osteolysis in a stable associated soft tissue mass involving the left ilium measuring at least 3.1 x 4.8 cm  at axial image # 97/2. Stable periosteal soft tissue mass involving the right iliac spine again noted measuring at least 3.5 x 5.8 cm at axial image # 85/2. IMPRESSION: 1. No acute intracranial abnormality. No calvarial fracture. 2.  No acute fracture or listhesis of the cervical spine. 3. Acute fractures of the left eleventh and twelfth ribs posteriorly. Small left pleural effusion. No pneumothorax. 4. Grade III AAST splenic injury. No perisplenic hematoma or free fluid. No active extravasation. 5. Extensive pulmonary and osseous metastatic disease, stable since prior examination. Electronically Signed   By: Fidela Salisbury M.D.   On: 04/12/2022 20:05   CT CHEST ABDOMEN PELVIS W CONTRAST  Result Date: 04/12/2022 CLINICAL DATA:  Fall. Facial trauma, blunt; Chest trauma, blunt; Neck trauma (Age >= 65y). Left flank bruising. Metastatic lung cancer. EXAM: CT HEAD WITHOUT CONTRAST CT CERVICAL SPINE WITHOUT CONTRAST CT CHEST, ABDOMEN AND PELVIS WITH CONTRAST TECHNIQUE: Contiguous axial images were obtained from the base of the skull through the vertex without intravenous contrast. Multidetector CT imaging of the cervical spine was performed without intravenous contrast. Multiplanar CT image reconstructions were also generated. Multidetector CT imaging of the chest, abdomen and pelvis was performed following the standard protocol during bolus administration of intravenous contrast. RADIATION DOSE REDUCTION: This exam was performed according to the departmental dose-optimization program which includes automated exposure control, adjustment of the mA and/or kV according to patient size and/or use of iterative reconstruction technique. CONTRAST:  71m OMNIPAQUE IOHEXOL 350 MG/ML SOLN COMPARISON:  CT chest 03/23/2022, CT chest abdomen pelvis of 02/26/2022 FINDINGS: CT HEAD FINDINGS Brain: Normal anatomic configuration. Parenchymal volume loss is commensurate with the patient's age. Mild periventricular white matter changes are present likely reflecting the sequela of small vessel ischemia. Remote lacunar infarcts are seen within the right insular cortex, right lentiform nucleus, and right cerebellum. No abnormal intra or extra-axial mass lesion or  fluid collection. No abnormal mass effect or midline shift. No evidence of acute intracranial hemorrhage or infarct. Ventricular size is normal. Cerebellum unremarkable. Vascular: No asymmetric hyperdense vasculature at the skull base. Skull: Intact Sinuses/Orbits: Paranasal sinuses are clear. Ocular lenses have been removed. Orbits are otherwise unremarkable. Other: Mastoid air cells and middle ear cavities are clear. CT CERVICAL FINDINGS Alignment: 2 mm anterolisthesis C7-T1 is likely degenerative in nature. Otherwise normal alignment. Skull base and vertebrae: Craniocervical alignment is normal. The atlantodental interval is not widened. No acute fracture of the cervical spine. Scattered sclerotic lesions are seen throughout the cervical spine in keeping with osseous metastatic disease. No pathologic fracture or significant focus of osteolysis is noted. Circumscribed lytic lesion within the C6 vertebral body superiorly is in keeping with a subchondral cyst related to degenerative disc disease. Soft tissues and spinal canal: No prevertebral fluid or swelling. No visible canal hematoma. Disc levels: There is intervertebral disc space narrowing and endplate remodeling throughout the cervical spine in keeping with changes of moderate to severe degenerative disc disease, most severe at C5-C7. Multilevel uncovertebral arthrosis is present resulting in moderate neuroforaminal narrowing bilaterally at C5-6 and on the right at C4-5. Milder neuroforaminal narrowing noted bilaterally at C6-7. Other:  None CT CHEST FINDINGS Cardiovascular: Extensive coronary artery calcification with evidence of prior coronary artery stenting. Global cardiac size is within normal limits. No pericardial effusion. Central pulmonary arteries are of normal caliber. Mild atherosclerotic calcification within the thoracic aorta. No aortic aneurysm. Mediastinum/Nodes: Visualized thyroid is unremarkable. No pathologic thoracic adenopathy. The  esophagus is unremarkable. Lungs/Pleura: Chronic collapse and consolidation of  the left upper lobe, likely representing post therapy effect, is unchanged. Fiducial markers are again seen within the consolidated left upper lobe. Innumerable pulmonary nodules demonstrating a miliary distribution throughout the aerated lungs bilaterally in keeping with intra pulmonary metastatic disease appears grossly stable since prior examination small left pleural effusion has developed. No pneumothorax. Musculoskeletal: Acute fractures of the left eleventh and twelfth ribs are seen posteriorly 11 fracture appears comminuted displaced with a tiny ossific fragment extending into the left subpleural space, best seen on image # 49/2. Widespread sclerotic metastases throughout the visualized axial skeleton appear grossly stable since prior examination no pathologic fracture. Healing mid sternal fracture again noted. CT ABDOMEN PELVIS FINDINGS Hepatobiliary: Stable tiny cyst or hemangioma within the right hepatic lobe. Liver otherwise unremarkable. Gallbladder unremarkable. No intra or extrahepatic biliary ductal dilation. Pancreas: Unremarkable Spleen: There is a laceration of the upper pole the spleen measuring 3.5 cm in greatest dimension which does not extend to the splenic hilum. No active extravasation. No perisplenic hematoma or free fluid identified. This is best characterized as a grade III AAST splenic injury. Adrenals/Urinary Tract: Adrenal glands are unremarkable. Kidneys are normal, without renal calculi, focal lesion, or hydronephrosis. Bladder is unremarkable. Stomach/Bowel: Stomach is within normal limits. Appendix appears normal. No evidence of bowel wall thickening, distention, or inflammatory changes. Vascular/Lymphatic: Aortic atherosclerosis. No enlarged abdominal or pelvic lymph nodes. Reproductive: Mild prostatic enlargement is again noted. Other: No abdominal wall hernia. Musculoskeletal: Widespread sclerotic  metastases are seen throughout the visualized axial skeleton of the abdomen and pelvis with prominent osteolysis in a stable associated soft tissue mass involving the left ilium measuring at least 3.1 x 4.8 cm at axial image # 97/2. Stable periosteal soft tissue mass involving the right iliac spine again noted measuring at least 3.5 x 5.8 cm at axial image # 85/2. IMPRESSION: 1. No acute intracranial abnormality. No calvarial fracture. 2. No acute fracture or listhesis of the cervical spine. 3. Acute fractures of the left eleventh and twelfth ribs posteriorly. Small left pleural effusion. No pneumothorax. 4. Grade III AAST splenic injury. No perisplenic hematoma or free fluid. No active extravasation. 5. Extensive pulmonary and osseous metastatic disease, stable since prior examination. Electronically Signed   By: Fidela Salisbury M.D.   On: 04/12/2022 20:05   DG Chest Portable 1 View  Result Date: 04/12/2022 CLINICAL DATA:  Left lower rib pain after injury.  Fall at home. EXAM: PORTABLE CHEST 1 VIEW COMPARISON:  03/23/2022 FINDINGS: Heart size and pulmonary vascularity are normal. Surgical clips in the left suprahilar region with soft tissue prominence along the left paratracheal and aortopulmonic window region likely representing postradiation changes or associated treated disease. Diffuse nodular parenchymal pattern to the lungs is similar to prior study and correlates with known pulmonary nodules. No developing consolidation or edema. No pleural effusions. No pneumothorax. Visualized ribs are grossly nondisplaced. IMPRESSION: Unchanged appearance of the chest since previous study. Again demonstrated is soft tissue density in the left mediastinum corresponding to known treated disease. Diffuse nodular parenchymal pattern to the lungs corresponding to known multiple pulmonary nodules. Electronically Signed   By: Lucienne Capers M.D.   On: 04/12/2022 19:27   ECHOCARDIOGRAM COMPLETE  Result Date: 03/24/2022     ECHOCARDIOGRAM REPORT   Patient Name:   DELMAS FAUCETT Date of Exam: 03/24/2022 Medical Rec #:  [id number]       Height:       71.0 in Accession #:    [id number]  Weight:       128.1 lb Date of Birth:  July 31, 1934       BSA:          1.745 m Patient Age:    65 years        BP:           156/81 mmHg Patient Gender: M               HR:           82 bpm. Exam Location:  [facility] Procedure: 2D Echo Indications:     Pulmonary Embolus I26.09  History:         Patient has prior history of Echocardiogram examinations, most                  recent 06/06/2021.  Sonographer:     Kathlen Brunswick RDCS Referring Phys:  Joseph Diagnosing Phys: [place]  1. Left ventricular ejection fraction, by estimation, is 45 to 50%. The left ventricle has mildly decreased function. The left ventricle demonstrates global hypokinesis. There is mild concentric left ventricular hypertrophy. Left ventricular diastolic parameters are consistent with Grade I diastolic dysfunction (impaired relaxation).  2. Right ventricular systolic function is normal. The right ventricular size is normal.  3. Left atrial size was mildly dilated.  4. Right atrial size was mildly dilated.  5. The mitral valve is normal in structure. Trivial mitral valve regurgitation. No evidence of mitral stenosis.  6. The aortic valve is normal in structure. Aortic valve regurgitation is mild to moderate. Aortic valve sclerosis is present, with no evidence of aortic valve stenosis.  7. The inferior vena cava is normal in size with greater than 50% respiratory variability, suggesting right atrial pressure of 3 mmHg. FINDINGS  Left Ventricle: Left ventricular ejection fraction, by estimation, is 45 to 50%. The left ventricle has mildly decreased function. The left ventricle demonstrates global hypokinesis. The left ventricular internal cavity size was normal in size. There is  mild concentric left ventricular hypertrophy. Left ventricular diastolic parameters  are consistent with Grade I diastolic dysfunction (impaired relaxation). Right Ventricle: The right ventricular size is normal. No increase in right ventricular wall thickness. Right ventricular systolic function is normal. Left Atrium: Left atrial size was mildly dilated. Right Atrium: Right atrial size was mildly dilated. Pericardium: There is no evidence of pericardial effusion. Mitral Valve: The mitral valve is normal in structure. Trivial mitral valve regurgitation. No evidence of mitral valve stenosis. Tricuspid Valve: The tricuspid valve is normal in structure. Tricuspid valve regurgitation is trivial. No evidence of tricuspid stenosis. Aortic Valve: The aortic valve is normal in structure. Aortic valve regurgitation is mild to moderate. Aortic regurgitation PHT measures 508 msec. Aortic valve sclerosis is present, with no evidence of aortic valve stenosis. Aortic valve peak gradient measures 6.8 mmHg. Pulmonic Valve: The pulmonic valve was normal in structure. Pulmonic valve regurgitation is trivial. No evidence of pulmonic stenosis. Aorta: The aortic root is normal in size and structure. Venous: The inferior vena cava is normal in size with greater than 50% respiratory variability, suggesting right atrial pressure of 3 mmHg. IAS/Shunts: No atrial level shunt detected by color flow Doppler.  LEFT VENTRICLE PLAX 2D LVIDd:         3.56 cm     Diastology LVIDs:         2.77 cm     LV e' medial:    3.81 cm/s LV PW:  1.00 cm     LV E/e' medial:  12.3 LV IVS:        0.78 cm     LV e' lateral:   3.92 cm/s LVOT diam:     1.90 cm     LV E/e' lateral: 11.9 LV SV:         44 LV SV Index:   25 LVOT Area:     2.84 cm  LV Volumes (MOD) LV vol d, MOD A2C: 39.6 ml LV vol d, MOD A4C: 84.2 ml LV vol s, MOD A2C: 22.0 ml LV vol s, MOD A4C: 42.0 ml LV SV MOD A2C:     17.6 ml LV SV MOD A4C:     84.2 ml LV SV MOD BP:      29.5 ml RIGHT VENTRICLE RV Basal diam:  2.50 cm RV S prime:     12.00 cm/s TAPSE (M-mode): 2.4 cm LEFT  ATRIUM             Index        RIGHT ATRIUM          Index LA diam:        2.50 cm 1.43 cm/m   RA Area:     7.92 cm LA Vol (A2C):   35.1 ml 20.12 ml/m  RA Volume:   11.50 ml 6.59 ml/m LA Vol (A4C):   22.3 ml 12.78 ml/m LA Biplane Vol: 29.0 ml 16.62 ml/m  AORTIC VALVE                 PULMONIC VALVE AV Area (Vmax): 1.72 cm     PV Vmax:       0.98 m/s AV Vmax:        130.00 cm/s  PV Peak grad:  3.8 mmHg AV Peak Grad:   6.8 mmHg LVOT Vmax:      79.00 cm/s LVOT Vmean:     55.300 cm/s LVOT VTI:       0.154 m AI PHT:         508 msec  AORTA Ao Root diam: 2.90 cm MITRAL VALVE MV Area (PHT): 4.77 cm    SHUNTS MV Decel Time: 159 msec    Systemic VTI:  0.15 m MV E velocity: 46.70 cm/s  Systemic Diam: 1.90 cm MV A velocity: 67.70 cm/s MV E/A ratio:  0.69 Shaukat Khan Electronically signed by Neoma Laming Signature Date/Time: 03/24/2022/5:13:51 PM    Final    US Venous Img Lower Bilateral (DVT)  Result Date: 03/24/2022 CLINICAL DATA:  Lower extremity edema EXAM: BILATERAL LOWER EXTREMITY VENOUS DOPPLER ULTRASOUND TECHNIQUE: Gray-scale sonography with graded compression, as well as color Doppler and duplex ultrasound were performed to evaluate the lower extremity deep venous systems from the level of the common femoral vein and including the common femoral, femoral, profunda femoral, popliteal and calf veins including the posterior tibial, peroneal and gastrocnemius veins when visible. Spectral Doppler was utilized to evaluate flow at rest and with distal augmentation maneuvers in the common femoral, femoral and popliteal veins. COMPARISON:  None Available. FINDINGS: RIGHT LOWER EXTREMITY Common Femoral Vein: No evidence of thrombus. Normal compressibility, respiratory phasicity and response to augmentation. Saphenofemoral Junction: No evidence of thrombus. Normal compressibility and flow on color Doppler imaging. Profunda Femoral Vein: No evidence of thrombus. Normal compressibility and flow on color Doppler imaging.  Femoral Vein: No evidence of thrombus. Normal compressibility, respiratory phasicity and response to augmentation. Popliteal Vein: No evidence of thrombus. Normal compressibility, respiratory phasicity and  response to augmentation. Calf Veins: No evidence of thrombus. Normal compressibility and flow on color Doppler imaging. LEFT LOWER EXTREMITY Common Femoral Vein: No evidence of thrombus. Normal compressibility, respiratory phasicity and response to augmentation. Saphenofemoral Junction: No evidence of thrombus. Normal compressibility and flow on color Doppler imaging. Profunda Femoral Vein: No evidence of thrombus. Normal compressibility and flow on color Doppler imaging. Femoral Vein: No evidence of thrombus. Normal compressibility, respiratory phasicity and response to augmentation. Popliteal Vein: No evidence of thrombus. Normal compressibility, respiratory phasicity and response to augmentation. Calf Veins: No evidence of thrombus. Normal compressibility and flow on color Doppler imaging. IMPRESSION: No evidence of deep venous thrombosis in either lower extremity. Electronically Signed   By: Jerilynn Mages.  Shick M.D.   On: 03/24/2022 16:15   CT Angio Chest PE W and/or Wo Contrast  Result Date: 03/23/2022 CLINICAL DATA:  Chest pain, pulmonary embolism suspected EXAM: CT ANGIOGRAPHY CHEST WITH CONTRAST TECHNIQUE: Multidetector CT imaging of the chest was performed using the standard protocol during bolus administration of intravenous contrast. Multiplanar CT image reconstructions and MIPs were obtained to evaluate the vascular anatomy. RADIATION DOSE REDUCTION: This exam was performed according to the departmental dose-optimization program which includes automated exposure control, adjustment of the mA and/or kV according to patient size and/or use of iterative reconstruction technique. CONTRAST:  71m OMNIPAQUE IOHEXOL 350 MG/ML SOLN COMPARISON:  02/26/2022 CT chest abdomen pelvis. FINDINGS: Cardiovascular:  Satisfactory opacification of the pulmonary arteries to the segmental level. Thrombus is noted in a right lower lobe posterior segment artery (series 5, image 286). Evidence of right heart strain with an RV:LV ratio of 1.13 and some reflux of contrast into the hepatic veins, although the appearance of the right ventricle appears similar to 02/26/2022. Chronic stenosis of some left upper lobe pulmonary arteries (series 5, image 174), related to chronic collapse of the posterior left upper lobe, which also demonstrates radiation fiducials. Normal heart size. Thrombus in the left atrial appendage (series 5, image 196), which is new compared to 02/26/2022. No pericardial effusion. Mediastinum/Nodes: No enlarged mediastinal, hilar, or axillary lymph nodes. Thyroid gland, trachea, and esophagus demonstrate no significant findings. Lungs/Pleura: Redemonstrated innumerable pulmonary nodules, which do seem to have increased in number consistent with metastatic disease. Redemonstrated chronic collapse of a portion of the posterior left upper lobe with radiation fiducials. Upper Abdomen: No acute abnormality. Musculoskeletal: Review of the MIP images confirms the above findings. IMPRESSION: 1. Positive for acute PE in a right lower lobe posterior segment artery with CT evidence of right heart strain (RV/LV Ratio = 1.13) consistent with at least submassive (intermediate risk) PE. The presence of right heart strain has been associated with an increased risk of morbidity and mortality. Please refer to the "Code PE Focused" order set in EPIC. Given the small amount of actual pulmonary thrombus and a similar appearance of the right ventricle on the 02/26/2022 CT, this may be chronic and in part related to stenosis of the left upper lobe pulmonary arteries, secondary to postradiation changes in the left lung. 2. Thrombus in the left atrial appendage, which is new compared to 02/26/2022. 3. Innumerable pulmonary nodules, which have  increased in number compared to 02/26/2022, consistent with metastatic disease. These results were called by telephone at the time of interpretation on 03/23/2022 at 12:28 pm [facility] who verbally acknowledged these results. Electronically Signed   By: AMerilyn BabaM.D.   On: 03/23/2022 12:31   DG Chest Portable 1 View  Result Date:  03/23/2022 CLINICAL DATA:  Chest pain. EXAM: PORTABLE CHEST 1 VIEW COMPARISON:  Prior chest x-ray on 08/01/2021 and CT of the chest on 02/26/2022 FINDINGS: Normal heart size. Stable emphysematous lung disease and again suggestion of multiple bilateral pulmonary nodules as depicted by recent CT. Stable chronic collapse of the medial left upper lobe. No pneumothorax, overt pulmonary edema or pleural fluid identified. IMPRESSION: Stable emphysematous lung disease, multiple bilateral pulmonary nodules and chronic collapse of the medial left upper lobe. Electronically Signed   By: Aletta Edouard M.D.   On: 03/23/2022 10:40     ONCOLOGY HISTORY:  Biopsy of lung nodule on November 27, 2016 revealed adenocarcinoma, but there was no invasive component on the sample. Biopsy of mediastinal lymph node had insufficient material. Patient ultimately declined any further biopsies or surgery and elected to proceed with XRT alone which he completed in April 2018.  PET scan on January 22, 2019 reviewed independently with increased metabolic activity prior to previous with SUV increasing from 6.5 up to 8.8.  Patient declined intervention at that time and elected to proceed with simple observation.  Repeat CT scan on April 21, 2019 reviewed independently with progressive 6.4 cm mass in the left upper lobe.  Patient declined systemic treatment, but agreed to proceed with XRT which she completed in approximately August 2020.  CT scan on September 23, 2019 reviewed independently with no obvious evidence of recurrent or progressive disease.   ASSESSMENT: Progressive adenocarcinoma of  upper lobe of left lung   PLAN:   1.  Progressive adenocarcinoma of upper lobe of left lung: PET scan results from August 17, 2021 reviewed independently with interval continued progression of bony metastatic disease and a new hypermetabolic bone lesion in the right sacrum.  Bilateral pulmonary nodules were stable, but concerning for metastatic involvement.  At that time we discussed systemic treatment, but patient and his wife elected for continued active surveillance.  Previously he stated he would not want aggressive chemotherapy but would consider immunotherapy if needed despite PD-L1 being unknown.  He has repeatedly stated that he does not want aggressive chemotherapy.  CT scan on Feb 26, 2022 and again on March 23, 2022 revealed minimally progressive disease.  Given the patient's increasing weakness and fatigue and declining performance status, no further treatments are planned at this time.  We discussed at length enrolling in hospice today, but patient wishes to further discuss with his family.  He has home palliative care in place and have ordered home health.  Proceed with IV fluids today.  No follow-up has been scheduled at this time pending on family discussion regarding hospice. 2.  Shoulder/back pain: Patient does not complain of this today.  Previously, patient reported this has been evident for years/decades.  Continue tramadol as needed.   3.  Shortness of breath/CHF: Patient does not complain of this today.  Follow-up with cardiology as needed. 4.  Bony disease: Patient receives Zometa on odd-numbered cycles as above.   5.  Leukocytosis: Chronic and unchanged. 6.  Hyponatremia: Chronic and unchanged.  Patient's sodium is 131 today. 7.  Hypokalemia: Resolved.  He has been instructed to only take his oral supplementation when he takes his diuretic.   8.  Foot pain/neuropathy: X-ray from February 09, 2022 revealed joint osteoarthritis.   9.  Poor appetite/weight loss: Megace has been  discontinued secondary to pulmonary embolism.  Patient now on Remeron.  10.  Pulmonary embolism: Patient was on Eliquis at the time of his PE and states that  he did not miss any doses, therefore we will have to consider him an Eliquis failure.  He has been transitioned to Xarelto.  11.  Weakness and fatigue/declining performance status: No treatments are planned at this time.  Have discussed hospice, but patient wishes to further discuss with his family.  IV fluids as above.    Patient expressed understanding and was in agreement with this plan. He also understands that He can call clinic at any time with any questions, concerns, or complaints.     [name], MD   04/13/2022 10:21 AM

## 2022-04-09 ENCOUNTER — Inpatient Hospital Stay: Payer: Medicare HMO

## 2022-04-09 ENCOUNTER — Inpatient Hospital Stay (HOSPITAL_BASED_OUTPATIENT_CLINIC_OR_DEPARTMENT_OTHER): Payer: Medicare HMO | Admitting: Medical Oncology

## 2022-04-09 ENCOUNTER — Other Ambulatory Visit: Payer: Medicare HMO | Admitting: Nurse Practitioner

## 2022-04-09 ENCOUNTER — Encounter: Payer: Self-pay | Admitting: Nurse Practitioner

## 2022-04-09 ENCOUNTER — Other Ambulatory Visit: Payer: Self-pay

## 2022-04-09 VITALS — BP 108/55 | HR 89 | Temp 98.8°F | Resp 16

## 2022-04-09 DIAGNOSIS — S2241XA Multiple fractures of ribs, right side, initial encounter for closed fracture: Secondary | ICD-10-CM | POA: Diagnosis not present

## 2022-04-09 DIAGNOSIS — S36032A Major laceration of spleen, initial encounter: Secondary | ICD-10-CM | POA: Diagnosis not present

## 2022-04-09 DIAGNOSIS — I951 Orthostatic hypotension: Secondary | ICD-10-CM

## 2022-04-09 DIAGNOSIS — R5381 Other malaise: Secondary | ICD-10-CM | POA: Diagnosis not present

## 2022-04-09 DIAGNOSIS — R0781 Pleurodynia: Secondary | ICD-10-CM | POA: Diagnosis not present

## 2022-04-09 DIAGNOSIS — C3412 Malignant neoplasm of upper lobe, left bronchus or lung: Secondary | ICD-10-CM

## 2022-04-09 DIAGNOSIS — I5022 Chronic systolic (congestive) heart failure: Secondary | ICD-10-CM | POA: Diagnosis not present

## 2022-04-09 DIAGNOSIS — R531 Weakness: Secondary | ICD-10-CM | POA: Diagnosis not present

## 2022-04-09 DIAGNOSIS — I110 Hypertensive heart disease with heart failure: Secondary | ICD-10-CM | POA: Diagnosis not present

## 2022-04-09 DIAGNOSIS — W1830XA Fall on same level, unspecified, initial encounter: Secondary | ICD-10-CM | POA: Diagnosis present

## 2022-04-09 DIAGNOSIS — T1490XA Injury, unspecified, initial encounter: Secondary | ICD-10-CM | POA: Diagnosis not present

## 2022-04-09 DIAGNOSIS — Y92009 Unspecified place in unspecified non-institutional (private) residence as the place of occurrence of the external cause: Secondary | ICD-10-CM

## 2022-04-09 DIAGNOSIS — E785 Hyperlipidemia, unspecified: Secondary | ICD-10-CM | POA: Diagnosis present

## 2022-04-09 DIAGNOSIS — S2239XB Fracture of one rib, unspecified side, initial encounter for open fracture: Secondary | ICD-10-CM | POA: Diagnosis not present

## 2022-04-09 DIAGNOSIS — R1012 Left upper quadrant pain: Secondary | ICD-10-CM | POA: Diagnosis not present

## 2022-04-09 DIAGNOSIS — W19XXXS Unspecified fall, sequela: Secondary | ICD-10-CM

## 2022-04-09 DIAGNOSIS — S36031A Moderate laceration of spleen, initial encounter: Secondary | ICD-10-CM | POA: Diagnosis not present

## 2022-04-09 DIAGNOSIS — W108XXA Fall (on) (from) other stairs and steps, initial encounter: Secondary | ICD-10-CM | POA: Diagnosis not present

## 2022-04-09 DIAGNOSIS — R911 Solitary pulmonary nodule: Secondary | ICD-10-CM | POA: Diagnosis not present

## 2022-04-09 DIAGNOSIS — W19XXXA Unspecified fall, initial encounter: Secondary | ICD-10-CM | POA: Diagnosis not present

## 2022-04-09 DIAGNOSIS — Z888 Allergy status to other drugs, medicaments and biological substances status: Secondary | ICD-10-CM | POA: Diagnosis not present

## 2022-04-09 DIAGNOSIS — Y92019 Unspecified place in single-family (private) house as the place of occurrence of the external cause: Secondary | ICD-10-CM | POA: Diagnosis not present

## 2022-04-09 DIAGNOSIS — S199XXA Unspecified injury of neck, initial encounter: Secondary | ICD-10-CM | POA: Diagnosis not present

## 2022-04-09 DIAGNOSIS — Z8249 Family history of ischemic heart disease and other diseases of the circulatory system: Secondary | ICD-10-CM | POA: Diagnosis not present

## 2022-04-09 DIAGNOSIS — S299XXA Unspecified injury of thorax, initial encounter: Secondary | ICD-10-CM | POA: Diagnosis present

## 2022-04-09 DIAGNOSIS — M1990 Unspecified osteoarthritis, unspecified site: Secondary | ICD-10-CM | POA: Diagnosis present

## 2022-04-09 DIAGNOSIS — S2242XA Multiple fractures of ribs, left side, initial encounter for closed fracture: Secondary | ICD-10-CM | POA: Diagnosis not present

## 2022-04-09 DIAGNOSIS — Z955 Presence of coronary angioplasty implant and graft: Secondary | ICD-10-CM | POA: Diagnosis not present

## 2022-04-09 DIAGNOSIS — S3991XA Unspecified injury of abdomen, initial encounter: Secondary | ICD-10-CM | POA: Diagnosis not present

## 2022-04-09 DIAGNOSIS — Z66 Do not resuscitate: Secondary | ICD-10-CM | POA: Diagnosis not present

## 2022-04-09 DIAGNOSIS — R52 Pain, unspecified: Secondary | ICD-10-CM | POA: Diagnosis not present

## 2022-04-09 DIAGNOSIS — Z515 Encounter for palliative care: Secondary | ICD-10-CM

## 2022-04-09 DIAGNOSIS — I252 Old myocardial infarction: Secondary | ICD-10-CM | POA: Diagnosis not present

## 2022-04-09 DIAGNOSIS — E860 Dehydration: Secondary | ICD-10-CM

## 2022-04-09 DIAGNOSIS — Z974 Presence of external hearing-aid: Secondary | ICD-10-CM | POA: Diagnosis not present

## 2022-04-09 DIAGNOSIS — Y939 Activity, unspecified: Secondary | ICD-10-CM | POA: Diagnosis not present

## 2022-04-09 DIAGNOSIS — I2699 Other pulmonary embolism without acute cor pulmonale: Secondary | ICD-10-CM | POA: Diagnosis not present

## 2022-04-09 DIAGNOSIS — Z7901 Long term (current) use of anticoagulants: Secondary | ICD-10-CM | POA: Diagnosis not present

## 2022-04-09 DIAGNOSIS — Z86711 Personal history of pulmonary embolism: Secondary | ICD-10-CM | POA: Diagnosis not present

## 2022-04-09 DIAGNOSIS — J942 Hemothorax: Secondary | ICD-10-CM | POA: Diagnosis not present

## 2022-04-09 DIAGNOSIS — Z8674 Personal history of sudden cardiac arrest: Secondary | ICD-10-CM | POA: Diagnosis not present

## 2022-04-09 DIAGNOSIS — R5383 Other fatigue: Secondary | ICD-10-CM | POA: Diagnosis not present

## 2022-04-09 DIAGNOSIS — S301XXA Contusion of abdominal wall, initial encounter: Secondary | ICD-10-CM | POA: Diagnosis not present

## 2022-04-09 DIAGNOSIS — I2510 Atherosclerotic heart disease of native coronary artery without angina pectoris: Secondary | ICD-10-CM | POA: Diagnosis present

## 2022-04-09 DIAGNOSIS — S271XXA Traumatic hemothorax, initial encounter: Secondary | ICD-10-CM | POA: Diagnosis not present

## 2022-04-09 DIAGNOSIS — S36039A Unspecified laceration of spleen, initial encounter: Secondary | ICD-10-CM | POA: Diagnosis not present

## 2022-04-09 DIAGNOSIS — R630 Anorexia: Secondary | ICD-10-CM | POA: Diagnosis not present

## 2022-04-09 DIAGNOSIS — S270XXA Traumatic pneumothorax, initial encounter: Secondary | ICD-10-CM | POA: Diagnosis not present

## 2022-04-09 DIAGNOSIS — H9192 Unspecified hearing loss, left ear: Secondary | ICD-10-CM | POA: Diagnosis present

## 2022-04-09 DIAGNOSIS — Z7401 Bed confinement status: Secondary | ICD-10-CM | POA: Diagnosis not present

## 2022-04-09 DIAGNOSIS — R1084 Generalized abdominal pain: Secondary | ICD-10-CM | POA: Diagnosis not present

## 2022-04-09 DIAGNOSIS — Z9221 Personal history of antineoplastic chemotherapy: Secondary | ICD-10-CM | POA: Diagnosis not present

## 2022-04-09 LAB — COMPREHENSIVE METABOLIC PANEL
ALT: 50 U/L — ABNORMAL HIGH (ref 0–44)
AST: 43 U/L — ABNORMAL HIGH (ref 15–41)
Albumin: 3.6 g/dL (ref 3.5–5.0)
Alkaline Phosphatase: 361 U/L — ABNORMAL HIGH (ref 38–126)
Anion gap: 9 (ref 5–15)
BUN: 21 mg/dL (ref 8–23)
CO2: 25 mmol/L (ref 22–32)
Calcium: 8.3 mg/dL — ABNORMAL LOW (ref 8.9–10.3)
Chloride: 97 mmol/L — ABNORMAL LOW (ref 98–111)
Creatinine, Ser: 1.09 mg/dL (ref 0.61–1.24)
GFR, Estimated: >60 mL/min (ref >60)
Glucose, Bld: 132 mg/dL — ABNORMAL HIGH (ref 70–99)
Potassium: 4.2 mmol/L (ref 3.5–5.1)
Sodium: 131 mmol/L — ABNORMAL LOW (ref 135–145)
Total Bilirubin: 0.8 mg/dL (ref 0.3–1.2)
Total Protein: 7 g/dL (ref 6.5–8.1)

## 2022-04-09 LAB — CBC WITH DIFFERENTIAL/PLATELET
Abs Immature Granulocytes: 0.1 10*3/uL — ABNORMAL HIGH (ref 0.00–0.07)
Basophils Absolute: 0.1 10*3/uL (ref 0.0–0.1)
Basophils Relative: 1 %
Eosinophils Absolute: 0.6 10*3/uL — ABNORMAL HIGH (ref 0.0–0.5)
Eosinophils Relative: 4 %
HCT: 41.9 % (ref 39.0–52.0)
Hemoglobin: 14.1 g/dL (ref 13.0–17.0)
Immature Granulocytes: 1 %
Lymphocytes Relative: 8 %
Lymphs Abs: 1.1 10*3/uL (ref 0.7–4.0)
MCH: 27.8 pg (ref 26.0–34.0)
MCHC: 33.7 g/dL (ref 30.0–36.0)
MCV: 82.5 fL (ref 80.0–100.0)
Monocytes Absolute: 1.2 10*3/uL — ABNORMAL HIGH (ref 0.1–1.0)
Monocytes Relative: 9 %
Neutro Abs: 10.1 10*3/uL — ABNORMAL HIGH (ref 1.7–7.7)
Neutrophils Relative %: 77 %
Platelets: 165 10*3/uL (ref 150–400)
RBC: 5.08 MIL/uL (ref 4.22–5.81)
RDW: 16.1 % — ABNORMAL HIGH (ref 11.5–15.5)
WBC: 13.1 10*3/uL — ABNORMAL HIGH (ref 4.0–10.5)
nRBC: 0 % (ref 0.0–0.2)

## 2022-04-09 MED ORDER — SODIUM CHLORIDE 0.9 % IV SOLN
INTRAVENOUS | Status: AC
  Filled 2022-04-09: qty 250

## 2022-04-10 ENCOUNTER — Encounter: Payer: Self-pay | Admitting: Oncology

## 2022-04-10 NOTE — Progress Notes (Signed)
[facility]  [phone] [phone]  ID: LEDARRIUS ROSATI OB: [birth year]  MR#: [id number]  GEX#:[id number]  Patient Care Team: Jerl Mina, MD as PCP - General (Family Medicine) Jeralyn Ruths, MD as Consulting Physician (Oncology)   CHIEF COMPLAINT: Progressive adenocarcinoma of upper lobe of left lung.  INTERVAL HISTORY: Patient presents with his daughter and his wife. They report that over the past few days he has felt weaker than normal and has fallen twice. He has not hit his head or had LOC. Attributes his falling to low BP. Normal BP is around 130-140 systolic however home nurse has been getting readings around 115 systolic and today in clinic his BP is hypotensive at 108/55. He reports that he is not eating and drinking well due to lack of desire. No fevers, SOB, dysuria, diarrhea. Fluids have helped him feel better. He is now using a rolling walker for stability and they have made the home more safe to prevent falls (removed rugs).   REVIEW OF SYSTEMS:   Review of Systems  Constitutional:  Positive for malaise/fatigue. Negative for fever and weight loss.  Respiratory: Negative.  Negative for cough, hemoptysis and shortness of breath.   Cardiovascular: Negative.  Negative for chest pain and leg swelling.  Gastrointestinal: Negative.  Negative for abdominal pain.  Genitourinary: Negative.  Negative for dysuria.  Musculoskeletal: Negative.  Negative for back pain and joint pain.  Skin: Negative.  Negative for rash.  Neurological:  Positive for weakness. Negative for sensory change, focal weakness and headaches.  Psychiatric/Behavioral: Negative.  The patient is not nervous/anxious.     As per HPI. Otherwise, a complete review of systems is negative.  PAST MEDICAL HISTORY: Past Medical History:  Diagnosis Date   Arthritis    CAD (coronary artery disease) Nov. 12, 2012   Inferior ST elevation MI in November of 2012 with ventricular  fibrillation arrest. Status post drug-eluting stent placement to the mid LAD. Most recent cardiac catheterization in June of this year showed patent stent with minimal restenosis, 75% proximal LAD and 80% distal LAD which were unchanged from before. Mild disease in the left circumflex and moderate RCA disease. Ejection fraction was 55%.   Cancer of upper lobe of left lung (HCC) 11/2016   Rad tx's.   Cardiac arrest [facility]) Nov. 2012   x 2    Cardiac arrest - ventricular fibrillation    HOH (hard of hearing)    Left Hearing Aid   Hyperlipidemia    Hypertension    Post PTCA 2013   x2   ST elevation MI (STEMI) (HCC) 08/26/2011    PAST SURGICAL HISTORY: Past Surgical History:  Procedure Laterality Date   CARDIAC CATHETERIZATION  2013   @ [facility]; [place] s/p stent    CORONARY ANGIOPLASTY     CORONARY/GRAFT ACUTE MI REVASCULARIZATION N/A 06/06/2021   Procedure: Coronary/Graft Acute MI Revascularization;  Surgeon: Iran Ouch, MD;  Location: [facility] INVASIVE CV LAB;  Service: Cardiovascular;  Laterality: N/A;   ELECTROMAGNETIC NAVIGATION BROCHOSCOPY N/A 11/27/2016   Procedure: ELECTROMAGNETIC NAVIGATION BRONCHOSCOPY;  Surgeon: Erin Fulling, MD;  Location: [facility] ORS;  Service: Cardiopulmonary;  Laterality: N/A;   LEFT HEART CATH AND CORONARY ANGIOGRAPHY N/A 06/06/2021   Procedure: LEFT HEART CATH AND CORONARY ANGIOGRAPHY;  Surgeon: Iran Ouch, MD;  Location: [facility] INVASIVE CV LAB;  Service: Cardiovascular;  Laterality: N/A;    FAMILY HISTORY: Family History  Problem Relation Age of Onset   Heart attack Mother  Heart attack Father     ADVANCED DIRECTIVES (Y/N):  N  HEALTH MAINTENANCE: Social History   Tobacco Use   Smoking status: Former    Packs/day: 1.00    Types: Cigarettes    Quit date: 08/27/2011    Years since quitting: 10.6   Smokeless tobacco: Never  Vaping Use   Vaping Use: Never used  Substance Use Topics   Alcohol use: No   Drug use: No      Colonoscopy:  PAP:  Bone density:  Lipid panel:  Allergies  Allergen Reactions   Statins Other (See Comments)    Arthralagia    Current Outpatient Medications  Medication Sig Dispense Refill   albuterol (PROVENTIL) (2.5 MG/3ML) 0.083% nebulizer solution SMARTSIG:3 Milliliter(s) Via Nebulizer Every 6 Hours PRN     albuterol (VENTOLIN HFA) 108 (90 Base) MCG/ACT inhaler Inhale into the lungs every 6 (six) hours as needed.     amiodarone (PACERONE) 200 MG tablet Take 1 tablet (200 mg total) by mouth 2 (two) times daily. (Patient taking differently: Take 100 mg by mouth daily.) 60 tablet 0   amLODipine (NORVASC) 5 MG tablet Take by mouth.     clotrimazole-betamethasone (LOTRISONE) cream Apply to affected area 2 times daily 15 g 1   enoxaparin (LOVENOX) 100 MG/ML injection Inject 0.975 mLs (97.5 mg total) into the skin daily. 29.25 mL 0   guaiFENesin (MUCINEX) 600 MG 12 hr tablet Take 600 mg by mouth 2 (two) times daily as needed for cough or to loosen phlegm.     losartan-hydrochlorothiazide (HYZAAR) 50-12.5 MG tablet Take 1 tablet by mouth daily.     mirtazapine (REMERON) 7.5 MG tablet Take by mouth.     naproxen sodium (ALEVE) 220 MG tablet Take 220 mg by mouth 2 (two) times daily with a meal.     potassium chloride SA (KLOR-CON M) 20 MEQ tablet Take 1 tablet (20 mEq total) by mouth daily. 30 tablet 3   rivaroxaban (XARELTO) 20 MG TABS tablet Take 1 tablet (20 mg total) by mouth daily with supper. 30 tablet 5   rosuvastatin (CRESTOR) 5 MG tablet Take 2.5 mg by mouth daily.     torsemide (DEMADEX) 20 MG tablet Take 1 tablet (20 mg total) by mouth daily. 30 tablet 0   traMADol (ULTRAM) 50 MG tablet Take 1 tablet (50 mg total) by mouth 2 (two) times daily. As needed for pain. 60 tablet 0   No current facility-administered medications for this visit.    OBJECTIVE: Vitals:   04/09/22 1530  BP: (!) 108/55  Pulse: 89  Resp: 16  Temp: 98.8 F (37.1 C)  SpO2: 100%     There is no  height or weight on file to calculate BMI.    ECOG FS:0 - Asymptomatic  General: Thin, no acute distress. Eyes: Pink conjunctiva, anicteric sclera. HEENT: Normocephalic, moist mucous membranes. Lungs: No audible wheezing or coughing. Heart: Regular rate and rhythm. Abdomen: Soft, nontender, no obvious distention. Musculoskeletal: No edema, cyanosis, or clubbing. Neuro: Alert, answering all questions appropriately. Cranial nerves grossly intact. Extremity strength intact and symmetric  Skin: No rashes or petechiae noted. Psych: Normal affect.   LAB RESULTS:  Lab Results  Component Value Date   NA 131 (L) 04/09/2022   K 4.2 04/09/2022   CL 97 (L) 04/09/2022   CO2 25 04/09/2022   GLUCOSE 132 (H) 04/09/2022   BUN 21 04/09/2022   CREATININE 1.09 04/09/2022   CALCIUM 8.3 (L) 04/09/2022   PROT 7.0  04/09/2022   ALBUMIN 3.6 04/09/2022   AST 43 (H) 04/09/2022   ALT 50 (H) 04/09/2022   ALKPHOS 361 (H) 04/09/2022   BILITOT 0.8 04/09/2022   GFRNONAA >60 04/09/2022   GFRAA >60 03/15/2020    Lab Results  Component Value Date   WBC 13.1 (H) 04/09/2022   NEUTROABS 10.1 (H) 04/09/2022   HGB 14.1 04/09/2022   HCT 41.9 04/09/2022   MCV 82.5 04/09/2022   PLT 165 04/09/2022     STUDIES: ECHOCARDIOGRAM COMPLETE  Result Date: 03/24/2022    ECHOCARDIOGRAM REPORT   Patient Name:   EDSELL KALO Date of Exam: 03/24/2022 Medical Rec #:  [id number]       Height:       71.0 in Accession #:    [id number]      Weight:       128.1 lb Date of Birth:  1934-09-13       BSA:          1.745 m Patient Age:    87 years        BP:           156/81 mmHg Patient Gender: M               HR:           82 bpm. Exam Location:  [facility] Procedure: 2D Echo Indications:     Pulmonary Embolus I26.09  History:         Patient has prior history of Echocardiogram examinations, most                  recent 06/06/2021.  Sonographer:     Overton Mam RDCS Referring Phys:  [id number] [facility] Diagnosing Phys: Adrian Blackwater  IMPRESSIONS  1. Left ventricular ejection fraction, by estimation, is 45 to 50%. The left ventricle has mildly decreased function. The left ventricle demonstrates global hypokinesis. There is mild concentric left ventricular hypertrophy. Left ventricular diastolic parameters are consistent with Grade I diastolic dysfunction (impaired relaxation).  2. Right ventricular systolic function is normal. The right ventricular size is normal.  3. Left atrial size was mildly dilated.  4. Right atrial size was mildly dilated.  5. The mitral valve is normal in structure. Trivial mitral valve regurgitation. No evidence of mitral stenosis.  6. The aortic valve is normal in structure. Aortic valve regurgitation is mild to moderate. Aortic valve sclerosis is present, with no evidence of aortic valve stenosis.  7. The inferior vena cava is normal in size with greater than 50% respiratory variability, suggesting right atrial pressure of 3 mmHg. FINDINGS  Left Ventricle: Left ventricular ejection fraction, by estimation, is 45 to 50%. The left ventricle has mildly decreased function. The left ventricle demonstrates global hypokinesis. The left ventricular internal cavity size was normal in size. There is  mild concentric left ventricular hypertrophy. Left ventricular diastolic parameters are consistent with Grade I diastolic dysfunction (impaired relaxation). Right Ventricle: The right ventricular size is normal. No increase in right ventricular wall thickness. Right ventricular systolic function is normal. Left Atrium: Left atrial size was mildly dilated. Right Atrium: Right atrial size was mildly dilated. Pericardium: There is no evidence of pericardial effusion. Mitral Valve: The mitral valve is normal in structure. Trivial mitral valve regurgitation. No evidence of mitral valve stenosis. Tricuspid Valve: The tricuspid valve is normal in structure. Tricuspid valve regurgitation is trivial. No evidence of tricuspid stenosis. Aortic  Valve: The aortic valve is normal in structure. Aortic valve regurgitation  is mild to moderate. Aortic regurgitation PHT measures 508 msec. Aortic valve sclerosis is present, with no evidence of aortic valve stenosis. Aortic valve peak gradient measures 6.8 mmHg. Pulmonic Valve: The pulmonic valve was normal in structure. Pulmonic valve regurgitation is trivial. No evidence of pulmonic stenosis. Aorta: The aortic root is normal in size and structure. Venous: The inferior vena cava is normal in size with greater than 50% respiratory variability, suggesting right atrial pressure of 3 mmHg. IAS/Shunts: No atrial level shunt detected by color flow Doppler.  LEFT VENTRICLE PLAX 2D LVIDd:         3.56 cm     Diastology LVIDs:         2.77 cm     LV e' medial:    3.81 cm/s LV PW:         1.00 cm     LV E/e' medial:  12.3 LV IVS:        0.78 cm     LV e' lateral:   3.92 cm/s LVOT diam:     1.90 cm     LV E/e' lateral: 11.9 LV SV:         44 LV SV Index:   25 LVOT Area:     2.84 cm  LV Volumes (MOD) LV vol d, MOD A2C: 39.6 ml LV vol d, MOD A4C: 84.2 ml LV vol s, MOD A2C: 22.0 ml LV vol s, MOD A4C: 42.0 ml LV SV MOD A2C:     17.6 ml LV SV MOD A4C:     84.2 ml LV SV MOD BP:      29.5 ml RIGHT VENTRICLE RV Basal diam:  2.50 cm RV S prime:     12.00 cm/s TAPSE (M-mode): 2.4 cm LEFT ATRIUM             Index        RIGHT ATRIUM          Index LA diam:        2.50 cm 1.43 cm/m   RA Area:     7.92 cm LA Vol (A2C):   35.1 ml 20.12 ml/m  RA Volume:   11.50 ml 6.59 ml/m LA Vol (A4C):   22.3 ml 12.78 ml/m LA Biplane Vol: 29.0 ml 16.62 ml/m  AORTIC VALVE                 PULMONIC VALVE AV Area (Vmax): 1.72 cm     PV Vmax:       0.98 m/s AV Vmax:        130.00 cm/s  PV Peak grad:  3.8 mmHg AV Peak Grad:   6.8 mmHg LVOT Vmax:      79.00 cm/s LVOT Vmean:     55.300 cm/s LVOT VTI:       0.154 m AI PHT:         508 msec  AORTA Ao Root diam: 2.90 cm MITRAL VALVE MV Area (PHT): 4.77 cm    SHUNTS MV Decel Time: 159 msec    Systemic VTI:   0.15 m MV E velocity: 46.70 cm/s  Systemic Diam: 1.90 cm MV A velocity: 67.70 cm/s MV E/A ratio:  0.69 [employer] signed by Adrian Blackwater Signature Date/Time: 03/24/2022/5:13:51 PM    Final    US Venous Img Lower Bilateral (DVT)  Result Date: 03/24/2022 CLINICAL DATA:  Lower extremity edema EXAM: BILATERAL LOWER EXTREMITY VENOUS DOPPLER ULTRASOUND TECHNIQUE: Gray-scale sonography with graded compression, as well as color  Doppler and duplex ultrasound were performed to evaluate the lower extremity deep venous systems from the level of the common femoral vein and including the common femoral, femoral, profunda femoral, popliteal and calf veins including the posterior tibial, peroneal and gastrocnemius veins when visible. Spectral Doppler was utilized to evaluate flow at rest and with distal augmentation maneuvers in the common femoral, femoral and popliteal veins. COMPARISON:  None Available. FINDINGS: RIGHT LOWER EXTREMITY Common Femoral Vein: No evidence of thrombus. Normal compressibility, respiratory phasicity and response to augmentation. Saphenofemoral Junction: No evidence of thrombus. Normal compressibility and flow on color Doppler imaging. Profunda Femoral Vein: No evidence of thrombus. Normal compressibility and flow on color Doppler imaging. Femoral Vein: No evidence of thrombus. Normal compressibility, respiratory phasicity and response to augmentation. Popliteal Vein: No evidence of thrombus. Normal compressibility, respiratory phasicity and response to augmentation. Calf Veins: No evidence of thrombus. Normal compressibility and flow on color Doppler imaging. LEFT LOWER EXTREMITY Common Femoral Vein: No evidence of thrombus. Normal compressibility, respiratory phasicity and response to augmentation. Saphenofemoral Junction: No evidence of thrombus. Normal compressibility and flow on color Doppler imaging. Profunda Femoral Vein: No evidence of thrombus. Normal compressibility and flow  on color Doppler imaging. Femoral Vein: No evidence of thrombus. Normal compressibility, respiratory phasicity and response to augmentation. Popliteal Vein: No evidence of thrombus. Normal compressibility, respiratory phasicity and response to augmentation. Calf Veins: No evidence of thrombus. Normal compressibility and flow on color Doppler imaging. IMPRESSION: No evidence of deep venous thrombosis in either lower extremity. Electronically Signed   By: Judie Petit.  Shick M.D.   On: 03/24/2022 16:15   CT Angio Chest PE W and/or Wo Contrast  Result Date: 03/23/2022 CLINICAL DATA:  Chest pain, pulmonary embolism suspected EXAM: CT ANGIOGRAPHY CHEST WITH CONTRAST TECHNIQUE: Multidetector CT imaging of the chest was performed using the standard protocol during bolus administration of intravenous contrast. Multiplanar CT image reconstructions and MIPs were obtained to evaluate the vascular anatomy. RADIATION DOSE REDUCTION: This exam was performed according to the departmental dose-optimization program which includes automated exposure control, adjustment of the mA and/or kV according to patient size and/or use of iterative reconstruction technique. CONTRAST:  75mL OMNIPAQUE IOHEXOL 350 MG/ML SOLN COMPARISON:  02/26/2022 CT chest abdomen pelvis. FINDINGS: Cardiovascular: Satisfactory opacification of the pulmonary arteries to the segmental level. Thrombus is noted in a right lower lobe posterior segment artery (series 5, image 286). Evidence of right heart strain with an RV:LV ratio of 1.13 and some reflux of contrast into the hepatic veins, although the appearance of the right ventricle appears similar to 02/26/2022. Chronic stenosis of some left upper lobe pulmonary arteries (series 5, image 174), related to chronic collapse of the posterior left upper lobe, which also demonstrates radiation fiducials. Normal heart size. Thrombus in the left atrial appendage (series 5, image 196), which is new compared to 02/26/2022. No  pericardial effusion. Mediastinum/Nodes: No enlarged mediastinal, hilar, or axillary lymph nodes. Thyroid gland, trachea, and esophagus demonstrate no significant findings. Lungs/Pleura: Redemonstrated innumerable pulmonary nodules, which do seem to have increased in number consistent with metastatic disease. Redemonstrated chronic collapse of a portion of the posterior left upper lobe with radiation fiducials. Upper Abdomen: No acute abnormality. Musculoskeletal: Review of the MIP images confirms the above findings. IMPRESSION: 1. Positive for acute PE in a right lower lobe posterior segment artery with CT evidence of right heart strain (RV/LV Ratio = 1.13) consistent with at least submassive (intermediate risk) PE. The presence of right heart strain has been associated  with an increased risk of morbidity and mortality. Please refer to the "Code PE Focused" order set in EPIC. Given the small amount of actual pulmonary thrombus and a similar appearance of the right ventricle on the 02/26/2022 CT, this may be chronic and in part related to stenosis of the left upper lobe pulmonary arteries, secondary to postradiation changes in the left lung. 2. Thrombus in the left atrial appendage, which is new compared to 02/26/2022. 3. Innumerable pulmonary nodules, which have increased in number compared to 02/26/2022, consistent with metastatic disease. These results were called by telephone at the time of interpretation on 03/23/2022 at 12:28 pm to provider [facility] , who verbally acknowledged these results. Electronically Signed   By: Wiliam Ke M.D.   On: 03/23/2022 12:31   DG Chest Portable 1 View  Result Date: 03/23/2022 CLINICAL DATA:  Chest pain. EXAM: PORTABLE CHEST 1 VIEW COMPARISON:  Prior chest x-ray on 08/01/2021 and CT of the chest on 02/26/2022 FINDINGS: Normal heart size. Stable emphysematous lung disease and again suggestion of multiple bilateral pulmonary nodules as depicted by recent CT. Stable  chronic collapse of the medial left upper lobe. No pneumothorax, overt pulmonary edema or pleural fluid identified. IMPRESSION: Stable emphysematous lung disease, multiple bilateral pulmonary nodules and chronic collapse of the medial left upper lobe. Electronically Signed   By: Irish Lack M.D.   On: 03/23/2022 10:40     ONCOLOGY HISTORY:  Biopsy of lung nodule on November 27, 2016 revealed adenocarcinoma, but there was no invasive component on the sample. Biopsy of mediastinal lymph node had insufficient material. Patient ultimately declined any further biopsies or surgery and elected to proceed with XRT alone which he completed in April 2018.  PET scan on January 22, 2019 reviewed independently with increased metabolic activity prior to previous with SUV increasing from 6.5 up to 8.8.  Patient declined intervention at that time and elected to proceed with simple observation.  Repeat CT scan on April 21, 2019 reviewed independently with progressive 6.4 cm mass in the left upper lobe.  Patient declined systemic treatment, but agreed to proceed with XRT which she completed in approximately August 2020.  CT scan on September 23, 2019 reviewed independently with no obvious evidence of recurrent or progressive disease.   ASSESSMENT: Progressive adenocarcinoma of upper lobe of left lung  Encounter Diagnoses  Name Primary?   Cancer of upper lobe of left lung (HCC)    Dehydration    Weakness    Orthostatic hypotension Yes   Fall in home, sequela      PLAN:   Hypotension likely secondary to his BP medication given weight loss from his disease process. Hypotension likely the cause of his fatigue, weakness and falls. Family agrees. 1 L IVF helped him feel better which helps support this thought process.. Labs reviewed as well as his home medications. We discussed reducing his lisinopril from 50 to 25 and ensuring that he has stopped his amlodipine. They wish to cut his current Lisinopril-hctz in half instead  of a new rx. He will continue to try to stay hydrated. Close monitoring for any signs of sepsis or illness. Close follow up on Thursday to see if any additional medication adjustments, fluids or work up needed. ER should symptoms worsen. Red flags discussed.    Patient expressed understanding and was in agreement with this plan. He also understands that He can call clinic at any time with any questions, concerns, or complaints.     Brand Males  [place], [place]   04/10/2022 11:14 AM

## 2022-04-12 ENCOUNTER — Emergency Department: Payer: Medicare HMO

## 2022-04-12 ENCOUNTER — Emergency Department
Admission: EM | Admit: 2022-04-12 | Discharge: 2022-04-12 | Disposition: A | Discharge status: Other Acute Inpatient Hospital | Payer: Medicare HMO | Attending: Emergency Medicine | Admitting: Emergency Medicine

## 2022-04-12 ENCOUNTER — Inpatient Hospital Stay: Payer: Medicare HMO

## 2022-04-12 ENCOUNTER — Inpatient Hospital Stay (HOSPITAL_COMMUNITY): Payer: Medicare HMO

## 2022-04-12 ENCOUNTER — Inpatient Hospital Stay (HOSPITAL_BASED_OUTPATIENT_CLINIC_OR_DEPARTMENT_OTHER): Payer: Medicare HMO | Admitting: Oncology

## 2022-04-12 ENCOUNTER — Inpatient Hospital Stay (HOSPITAL_COMMUNITY)
Admission: AD | Admit: 2022-04-12 | Discharge: 2022-04-14 | DRG: 964 | Disposition: A | Discharge status: Hospice | Payer: Medicare HMO | Source: Other Acute Inpatient Hospital | Attending: Surgery | Admitting: Surgery

## 2022-04-12 ENCOUNTER — Encounter: Payer: Self-pay | Admitting: Oncology

## 2022-04-12 ENCOUNTER — Inpatient Hospital Stay (HOSPITAL_BASED_OUTPATIENT_CLINIC_OR_DEPARTMENT_OTHER): Payer: Medicare HMO | Admitting: Hospice and Palliative Medicine

## 2022-04-12 ENCOUNTER — Other Ambulatory Visit: Payer: Self-pay

## 2022-04-12 VITALS — BP 113/63 | HR 98 | Temp 97.9°F | Resp 16 | Ht 71.0 in | Wt 122.6 lb

## 2022-04-12 DIAGNOSIS — S301XXA Contusion of abdominal wall, initial encounter: Secondary | ICD-10-CM | POA: Diagnosis not present

## 2022-04-12 DIAGNOSIS — S36039A Unspecified laceration of spleen, initial encounter: Principal | ICD-10-CM | POA: Diagnosis present

## 2022-04-12 DIAGNOSIS — C3412 Malignant neoplasm of upper lobe, left bronchus or lung: Secondary | ICD-10-CM

## 2022-04-12 DIAGNOSIS — Z7901 Long term (current) use of anticoagulants: Secondary | ICD-10-CM | POA: Diagnosis not present

## 2022-04-12 DIAGNOSIS — Z974 Presence of external hearing-aid: Secondary | ICD-10-CM | POA: Diagnosis not present

## 2022-04-12 DIAGNOSIS — Z515 Encounter for palliative care: Secondary | ICD-10-CM

## 2022-04-12 DIAGNOSIS — H9192 Unspecified hearing loss, left ear: Secondary | ICD-10-CM | POA: Diagnosis present

## 2022-04-12 DIAGNOSIS — Z86711 Personal history of pulmonary embolism: Secondary | ICD-10-CM

## 2022-04-12 DIAGNOSIS — S2242XA Multiple fractures of ribs, left side, initial encounter for closed fracture: Secondary | ICD-10-CM | POA: Diagnosis present

## 2022-04-12 DIAGNOSIS — S36031A Moderate laceration of spleen, initial encounter: Principal | ICD-10-CM | POA: Diagnosis present

## 2022-04-12 DIAGNOSIS — I110 Hypertensive heart disease with heart failure: Secondary | ICD-10-CM | POA: Diagnosis present

## 2022-04-12 DIAGNOSIS — T1490XA Injury, unspecified, initial encounter: Secondary | ICD-10-CM | POA: Diagnosis not present

## 2022-04-12 DIAGNOSIS — Z9221 Personal history of antineoplastic chemotherapy: Secondary | ICD-10-CM

## 2022-04-12 DIAGNOSIS — Y939 Activity, unspecified: Secondary | ICD-10-CM | POA: Diagnosis not present

## 2022-04-12 DIAGNOSIS — S36032A Major laceration of spleen, initial encounter: Secondary | ICD-10-CM | POA: Diagnosis not present

## 2022-04-12 DIAGNOSIS — W19XXXA Unspecified fall, initial encounter: Secondary | ICD-10-CM | POA: Diagnosis present

## 2022-04-12 DIAGNOSIS — I2699 Other pulmonary embolism without acute cor pulmonale: Secondary | ICD-10-CM | POA: Diagnosis not present

## 2022-04-12 DIAGNOSIS — R531 Weakness: Secondary | ICD-10-CM | POA: Insufficient documentation

## 2022-04-12 DIAGNOSIS — J942 Hemothorax: Secondary | ICD-10-CM | POA: Diagnosis not present

## 2022-04-12 DIAGNOSIS — I5022 Chronic systolic (congestive) heart failure: Secondary | ICD-10-CM | POA: Diagnosis present

## 2022-04-12 DIAGNOSIS — M1990 Unspecified osteoarthritis, unspecified site: Secondary | ICD-10-CM | POA: Diagnosis present

## 2022-04-12 DIAGNOSIS — W1830XA Fall on same level, unspecified, initial encounter: Secondary | ICD-10-CM | POA: Diagnosis present

## 2022-04-12 DIAGNOSIS — S271XXA Traumatic hemothorax, initial encounter: Secondary | ICD-10-CM | POA: Diagnosis present

## 2022-04-12 DIAGNOSIS — R1012 Left upper quadrant pain: Secondary | ICD-10-CM | POA: Diagnosis not present

## 2022-04-12 DIAGNOSIS — Z8249 Family history of ischemic heart disease and other diseases of the circulatory system: Secondary | ICD-10-CM

## 2022-04-12 DIAGNOSIS — R0781 Pleurodynia: Secondary | ICD-10-CM | POA: Diagnosis not present

## 2022-04-12 DIAGNOSIS — Z8674 Personal history of sudden cardiac arrest: Secondary | ICD-10-CM

## 2022-04-12 DIAGNOSIS — Z888 Allergy status to other drugs, medicaments and biological substances status: Secondary | ICD-10-CM

## 2022-04-12 DIAGNOSIS — I2510 Atherosclerotic heart disease of native coronary artery without angina pectoris: Secondary | ICD-10-CM | POA: Diagnosis present

## 2022-04-12 DIAGNOSIS — Y92009 Unspecified place in unspecified non-institutional (private) residence as the place of occurrence of the external cause: Secondary | ICD-10-CM | POA: Insufficient documentation

## 2022-04-12 DIAGNOSIS — S2241XA Multiple fractures of ribs, right side, initial encounter for closed fracture: Secondary | ICD-10-CM | POA: Diagnosis not present

## 2022-04-12 DIAGNOSIS — S199XXA Unspecified injury of neck, initial encounter: Secondary | ICD-10-CM | POA: Diagnosis not present

## 2022-04-12 DIAGNOSIS — Z66 Do not resuscitate: Secondary | ICD-10-CM | POA: Diagnosis present

## 2022-04-12 DIAGNOSIS — I252 Old myocardial infarction: Secondary | ICD-10-CM

## 2022-04-12 DIAGNOSIS — S270XXA Traumatic pneumothorax, initial encounter: Secondary | ICD-10-CM | POA: Diagnosis not present

## 2022-04-12 DIAGNOSIS — W108XXA Fall (on) (from) other stairs and steps, initial encounter: Secondary | ICD-10-CM | POA: Insufficient documentation

## 2022-04-12 DIAGNOSIS — Z955 Presence of coronary angioplasty implant and graft: Secondary | ICD-10-CM

## 2022-04-12 DIAGNOSIS — S299XXA Unspecified injury of thorax, initial encounter: Secondary | ICD-10-CM | POA: Diagnosis present

## 2022-04-12 DIAGNOSIS — E785 Hyperlipidemia, unspecified: Secondary | ICD-10-CM | POA: Diagnosis present

## 2022-04-12 DIAGNOSIS — Y92019 Unspecified place in single-family (private) house as the place of occurrence of the external cause: Secondary | ICD-10-CM | POA: Diagnosis not present

## 2022-04-12 DIAGNOSIS — E860 Dehydration: Secondary | ICD-10-CM

## 2022-04-12 LAB — URINALYSIS, ROUTINE W REFLEX MICROSCOPIC
Bilirubin Urine: NEGATIVE
Glucose, UA: NEGATIVE mg/dL
Hgb urine dipstick: NEGATIVE
Ketones, ur: NEGATIVE mg/dL
Leukocytes,Ua: NEGATIVE
Nitrite: NEGATIVE
Protein, ur: NEGATIVE mg/dL
Specific Gravity, Urine: 1.026 (ref 1.005–1.030)
pH: 7 (ref 5.0–8.0)

## 2022-04-12 LAB — CBC WITH DIFFERENTIAL/PLATELET
Abs Immature Granulocytes: 0.13 10*3/uL — ABNORMAL HIGH (ref 0.00–0.07)
Basophils Absolute: 0.1 10*3/uL (ref 0.0–0.1)
Basophils Relative: 1 %
Eosinophils Absolute: 0.3 10*3/uL (ref 0.0–0.5)
Eosinophils Relative: 3 %
HCT: 39.8 % (ref 39.0–52.0)
Hemoglobin: 13.3 g/dL (ref 13.0–17.0)
Immature Granulocytes: 1 %
Lymphocytes Relative: 8 %
Lymphs Abs: 0.9 10*3/uL (ref 0.7–4.0)
MCH: 27.7 pg (ref 26.0–34.0)
MCHC: 33.4 g/dL (ref 30.0–36.0)
MCV: 82.9 fL (ref 80.0–100.0)
Monocytes Absolute: 1 10*3/uL (ref 0.1–1.0)
Monocytes Relative: 9 %
Neutro Abs: 9 10*3/uL — ABNORMAL HIGH (ref 1.7–7.7)
Neutrophils Relative %: 78 %
Platelets: 140 10*3/uL — ABNORMAL LOW (ref 150–400)
RBC: 4.8 MIL/uL (ref 4.22–5.81)
RDW: 16 % — ABNORMAL HIGH (ref 11.5–15.5)
WBC: 11.5 10*3/uL — ABNORMAL HIGH (ref 4.0–10.5)
nRBC: 0 % (ref 0.0–0.2)

## 2022-04-12 LAB — CBC
HCT: 34.9 % — ABNORMAL LOW (ref 39.0–52.0)
Hemoglobin: 11.5 g/dL — ABNORMAL LOW (ref 13.0–17.0)
MCH: 27.4 pg (ref 26.0–34.0)
MCHC: 33 g/dL (ref 30.0–36.0)
MCV: 83.1 fL (ref 80.0–100.0)
Platelets: 120 10*3/uL — ABNORMAL LOW (ref 150–400)
RBC: 4.2 MIL/uL — ABNORMAL LOW (ref 4.22–5.81)
RDW: 16 % — ABNORMAL HIGH (ref 11.5–15.5)
WBC: 12.2 10*3/uL — ABNORMAL HIGH (ref 4.0–10.5)
nRBC: 0 % (ref 0.0–0.2)

## 2022-04-12 LAB — BASIC METABOLIC PANEL
Anion gap: 8 (ref 5–15)
BUN: 16 mg/dL (ref 8–23)
CO2: 23 mmol/L (ref 22–32)
Calcium: 8 mg/dL — ABNORMAL LOW (ref 8.9–10.3)
Chloride: 100 mmol/L (ref 98–111)
Creatinine, Ser: 0.87 mg/dL (ref 0.61–1.24)
GFR, Estimated: >60 mL/min (ref >60)
Glucose, Bld: 127 mg/dL — ABNORMAL HIGH (ref 70–99)
Potassium: 4.2 mmol/L (ref 3.5–5.1)
Sodium: 131 mmol/L — ABNORMAL LOW (ref 135–145)

## 2022-04-12 LAB — COMPREHENSIVE METABOLIC PANEL
ALT: 38 U/L (ref 0–44)
AST: 39 U/L (ref 15–41)
Albumin: 3.6 g/dL (ref 3.5–5.0)
Alkaline Phosphatase: 342 U/L — ABNORMAL HIGH (ref 38–126)
Anion gap: 9 (ref 5–15)
BUN: 18 mg/dL (ref 8–23)
CO2: 25 mmol/L (ref 22–32)
Calcium: 8.2 mg/dL — ABNORMAL LOW (ref 8.9–10.3)
Chloride: 95 mmol/L — ABNORMAL LOW (ref 98–111)
Creatinine, Ser: 0.98 mg/dL (ref 0.61–1.24)
GFR, Estimated: >60 mL/min (ref >60)
Glucose, Bld: 130 mg/dL — ABNORMAL HIGH (ref 70–99)
Potassium: 4.2 mmol/L (ref 3.5–5.1)
Sodium: 129 mmol/L — ABNORMAL LOW (ref 135–145)
Total Bilirubin: 1 mg/dL (ref 0.3–1.2)
Total Protein: 6.6 g/dL (ref 6.5–8.1)

## 2022-04-12 LAB — PROTIME-INR
INR: 2.2 — ABNORMAL HIGH (ref 0.8–1.2)
Prothrombin Time: 24.5 seconds — ABNORMAL HIGH (ref 11.4–15.2)

## 2022-04-12 LAB — TSH: TSH: 2.476 u[IU]/mL (ref 0.350–4.500)

## 2022-04-12 MED ORDER — ONDANSETRON HCL 4 MG/2ML IJ SOLN: 4.0000 mg | Freq: Four times a day (QID) | INTRAMUSCULAR | Status: DC | PRN

## 2022-04-12 MED ORDER — SODIUM CHLORIDE 0.9 % IV SOLN
Freq: Once | INTRAVENOUS | Status: AC
  Filled 2022-04-12: qty 250

## 2022-04-12 MED ORDER — ONDANSETRON 4 MG PO TBDP: 4.0000 mg | ORAL_TABLET | Freq: Four times a day (QID) | ORAL | Status: DC | PRN

## 2022-04-12 MED ORDER — HYDRALAZINE HCL 20 MG/ML IJ SOLN: 10.0000 mg | INTRAMUSCULAR | Status: DC | PRN

## 2022-04-12 MED ORDER — IOHEXOL 350 MG/ML SOLN
75.0000 mL | Freq: Once | INTRAVENOUS | Status: AC | PRN
  Administered 2022-04-12: 75 mL via INTRAVENOUS

## 2022-04-12 MED ORDER — METOPROLOL TARTRATE 5 MG/5ML IV SOLN: 5.0000 mg | Freq: Four times a day (QID) | INTRAVENOUS | Status: DC | PRN

## 2022-04-12 MED ORDER — MORPHINE SULFATE (PF) 2 MG/ML IV SOLN: 2.0000 mg | INTRAVENOUS | Status: DC | PRN

## 2022-04-12 MED ORDER — HYDROCODONE-ACETAMINOPHEN 5-325 MG PO TABS
1.0000 | ORAL_TABLET | Freq: Once | ORAL | Status: AC
  Administered 2022-04-12: 1 via ORAL
  Filled 2022-04-12: qty 1

## 2022-04-12 MED ORDER — ACETAMINOPHEN 325 MG PO TABS: 650.0000 mg | ORAL_TABLET | ORAL | Status: DC | PRN

## 2022-04-12 MED ORDER — DOCUSATE SODIUM 100 MG PO CAPS
100.0000 mg | ORAL_CAPSULE | Freq: Two times a day (BID) | ORAL | Status: DC
  Administered 2022-04-13 – 2022-04-14 (×3): 100 mg via ORAL
  Filled 2022-04-12 (×3): qty 1

## 2022-04-12 MED ORDER — OXYCODONE HCL 5 MG PO TABS: 5.0000 mg | ORAL_TABLET | ORAL | Status: DC | PRN

## 2022-04-12 NOTE — ED Provider Notes (Signed)
[facility] Provider Note    Event Date/Time   First MD Initiated Contact with Patient 04/12/22 1849     (approximate)   History   Fall   HPI  Scott Gross is a 88 y.o. male who on review of notes from oncology today has a history of previous cardiac arrest, V-fib, coronary disease, and adenocarcinoma, pulmonary embolism on Xarelto  He has recently been having malaise and fatigue as well as weakness.  Had labs done today at oncology  Patient was having pain over his left upper abdomen and left flank where he has bruising and abrasion.  He took a hydrocodone tablet at home and that is improved to  He has had family's been helping care for him at home, today he got up and decided to walk towards the bathroom took about 5 steps and fell striking his left upper abdomen and left lower rib cage on a piece of furniture.  He does not think he hit his head he denies that he lost consciousness  Currently in no pain.  Denies injury to the arms or legs.  No neck pain.       Physical Exam   Triage Vital Signs: ED Triage Vitals  Enc Vitals Group     BP 04/12/22 1848 128/60     Pulse Rate 04/12/22 1848 97     Resp 04/12/22 1848 16     Temp 04/12/22 1848 98.3 F (36.8 C)     Temp Source 04/12/22 1848 Oral     SpO2 04/12/22 1848 99 %     Weight 04/12/22 1853 130 lb 8.2 oz (59.2 kg)     Height --      Head Circumference --      Peak Flow --      Pain Score 04/12/22 1853 0     Pain Loc --      Pain Edu? --      Excl. in [place]? --     Most recent vital signs: Vitals:   04/12/22 2107 04/12/22 2130  BP: 118/62 (!) 124/58  Pulse: 96 95  Resp: 18 17  Temp:    SpO2: 98% 99%     General: Awake, no distress.  Very pleasant.  Appears somewhat frail but in no distress and not in any pain CV:  Good peripheral perfusion.  Normal heart sounds.  Regular rate Resp:  Normal effort.  Clear bilaterally. Abd:  No distention.  Patient has contusion and moderate  approximately fist-sized hematoma over the left upper flank abutting the left costal margin.  There is overlying abrasion but no laceration, bleeding controlled.  Some bruising forming in the same area. Other:  Moves all extremities well without deficit.  Good strength in all extremities.  Denies tenderness to palpation along the cervical spine demonstrates good range of motion of the neck without pain.  Head atraumatic  No bruising noted across the chest or abdomen except as noted above.   ED Results / Procedures / Treatments   Labs (all labs ordered are listed, but only abnormal results are displayed) Labs Reviewed  CBC - Abnormal; Notable for the following components:      Result Value   WBC 12.2 (*)    RBC 4.20 (*)    Hemoglobin 11.5 (*)    HCT 34.9 (*)    RDW 16.0 (*)    Platelets 120 (*)    All other components within normal limits  BASIC METABOLIC PANEL - Abnormal;  Notable for the following components:   Sodium 131 (*)    Glucose, Bld 127 (*)    Calcium 8.0 (*)    All other components within normal limits  PROTIME-INR - Abnormal; Notable for the following components:   Prothrombin Time 24.5 (*)    INR 2.2 (*)    All other components within normal limits  URINALYSIS, ROUTINE W REFLEX MICROSCOPIC - Abnormal; Notable for the following components:   Color, Urine STRAW (*)    APPearance CLEAR (*)    All other components within normal limits     EKG  Interpreted by me at 1855 heart rate 100 QRS 100 QTc 510 borderline sinus tachycardia, no ischemic abnormality   RADIOLOGY  Interpreted CT of the head for gross acute pathology, no acute intracranial pathology to noted on my exam read     CT cervical spine chest abdomen pelvis reviewed  IMPRESSION:  1. No acute intracranial abnormality. No calvarial fracture.  2. No acute fracture or listhesis of the cervical spine.  3. Acute fractures of the left eleventh and twelfth ribs  posteriorly. Small left pleural effusion.  No pneumothorax.  4. Grade III AAST splenic injury. No perisplenic hematoma or free  fluid. No active extravasation.  5. Extensive pulmonary and osseous metastatic disease, stable since  prior examination.     PROCEDURES:  Critical Care performed: Yes, see critical care procedure note(s)  CRITICAL CARE Performed by: Delman Kitten   Total critical care time: 35 minutes  Critical care time was exclusive of separately billable procedures and treating other patients.  Critical care was necessary to treat or prevent imminent or life-threatening deterioration.  Critical care was time spent personally by me on the following activities: development of treatment plan with patient and/or surrogate as well as nursing, discussions with consultants, evaluation of patient's response to treatment, examination of patient, obtaining history from patient or surrogate, ordering and performing treatments and interventions, ordering and review of laboratory studies, ordering and review of radiographic studies, pulse oximetry and re-evaluation of patient's condition.   Procedures   MEDICATIONS ORDERED IN ED: Medications  iohexol (OMNIPAQUE) 350 MG/ML injection 75 mL (75 mLs Intravenous Contrast Given 04/12/22 1925)     IMPRESSION / MDM / ASSESSMENT AND PLAN / ED COURSE  I reviewed the triage vital signs and the nursing notes.                              Differential diagnosis includes, but is not limited to, traumatic injury from fall such as blunt trauma to the chest abdomen or pelvis.  Also consider injury to the head and cervical spine though no compelling evidence by exam but he does report striking head.  He does have findings concerning for possible acute intra-abdominal pathology concern for splenic lack, hematoma, pneumothorax, hemothorax etc.  Patient's presentation is most consistent with acute presentation with potential threat to life or bodily function.  The patient is on the cardiac  monitor to evaluate for evidence of arrhythmia and/or significant heart rate changes.  Labs interpreted, very mild anemia now noted.  Slight leukocytosis similar to previous check.  Metabolic panel notable for mild hyponatremia slightly improved from checked this morning.  INR 2.2 patient known to be on Xarelto last dose 8am   Clinical Course as of 04/12/22 2148  Thu Apr 12, 2022  1938 Personally interpreted the patient's chest x-ray, negative for acute gross pathology.  However multiple chronic  findings.  Also reviewed radiologist report which indicates stable chest exam.  In particular there is no evidence of pneumothorax or obvious broken rib [MQ]  2048 Discussed with patient and his wife, both understanding and agreeable with plan to transfer to [facility] health in [place] to the trauma service for further treatment care and observation for splenic laceration and traumatic injuries. [MQ]    Clinical Course User Index [MQ] Delman Kitten, MD   ----------------------------------------- 8:11 PM on 04/12/2022 ----------------------------------------- Discussed CT findings with radiologist Dr. Dede Query.   Based on findings of splenic laceration as well as pleural effusion rib fractures possible small left-sided hemothorax I have requested transfer to trauma services at [facility], [facility] transfer center called at this time ----------------------------------------- 9:49 PM on 04/12/2022 -----------------------------------------   Vitals:   04/12/22 2107 04/12/22 2130  BP: 118/62 (!) 124/58  Pulse: 96 95  Resp: 18 17  Temp:    SpO2: 98% 99%     Resting comfortably stable hemodynamics no evidence to support acute worsening hemorrhage noted at this time but patient will obviously require serial hemoglobin monitoring and careful follow-up in the surgical ICU.  He has been accepted in transfer by Dr. Gurney Maxin of the trauma service at [facility]  Patient is wife as well as daughter  are aware and agreeable with plan for transfer.  Stable for transfer at this time under [facility] critical care service  FINAL CLINICAL IMPRESSION(S) / ED DIAGNOSES   Final diagnoses:  Laceration of spleen, initial encounter  Closed fracture of multiple ribs of left side, initial encounter     Rx / DC Orders   ED Discharge Orders     None        Note:  This document was prepared using Dragon voice recognition software and may include unintentional dictation errors.   Delman Kitten, MD 04/12/22 2149

## 2022-04-12 NOTE — ED Notes (Signed)
[place] reviewed by this RN ?

## 2022-04-12 NOTE — ED Triage Notes (Signed)
Pt arrives via EMS. Pt arrived d/t a fall at home. Per EMS pt has cancer that has went to the bone. Per pt family, pt has had a decline in strength and over all ADL's. Pt fell with EMS and landed on the left flank. Bruising is noted. Pt is on Eliquis and Plavix. Pt is A/Ox4

## 2022-04-12 NOTE — ED Notes (Signed)
Called [facility] to request transfer to Trauma spoke to [facility] @ 2010 waiting on a call back

## 2022-04-12 NOTE — H&P (Addendum)
Activation and Reason: transfer from [facility], fall  Primary Survey: airway intact, breath sounds decreased left side, pulses intact  Scott Gross is an 88 y.o. male.  HPI: 88 yo male had his legs give out and fell hitting an end table to his left chest. He has fallen 2 other times this month but was able to catch himself. He denies pain or dyspnea. He was diagnosed with PE 03/23/2022 and put on xarelto.  At [facility] he was diagnosed with splenic laceration and rib fractures with possible hemothorax  Past Medical History:  Diagnosis Date   Arthritis    CAD (coronary artery disease) Nov. 12, 2012   Inferior ST elevation MI in November of 2012 with ventricular fibrillation arrest. Status post drug-eluting stent placement to the mid LAD. Most recent cardiac catheterization in June of this year showed patent stent with minimal restenosis, 75% proximal LAD and 80% distal LAD which were unchanged from before. Mild disease in the left circumflex and moderate RCA disease. Ejection fraction was 55%.   Cancer of upper lobe of left lung ([place]) 11/2016   Rad tx's.   Cardiac arrest [facility]) Nov. 2012   x 2    Cardiac arrest - ventricular fibrillation    HOH (hard of hearing)    Left Hearing Aid   Hyperlipidemia    Hypertension    Post PTCA 2013   x2   ST elevation MI (STEMI) ([place]) 08/26/2011    Past Surgical History:  Procedure Laterality Date   CARDIAC CATHETERIZATION  2013   @ [facility]; [facility] s/p stent    CORONARY ANGIOPLASTY     CORONARY/GRAFT ACUTE MI REVASCULARIZATION N/A 06/06/2021   Procedure: Coronary/Graft Acute MI Revascularization;  Surgeon: Wellington Hampshire, MD;  Location: [place] [facility];  Service: Cardiovascular;  Laterality: N/A;   ELECTROMAGNETIC NAVIGATION BROCHOSCOPY N/A 11/27/2016   Procedure: ELECTROMAGNETIC NAVIGATION BRONCHOSCOPY;  Surgeon: [name], MD;  Location: [facility] OR[facility];  Service: Cardiopulmonary;  Laterality: N/A;   LEFT HEART CATH AND CORONARY ANGIOGRAPHY N/A  06/06/2021   Procedure: LEFT HEART CATH AND CORONARY ANGIOGRAPHY;  Surgeon: Wellington Hampshire, MD;  Location: [place] [facility];  Service: Cardiovascular;  Laterality: N/A;    Family History  Problem Relation Age of Onset   Heart attack Mother    Heart attack Father     Social History:  reports that he quit smoking about 10 years ago. His smoking use included cigarettes. He smoked an average of 1 pack per day. He has never used smokeless tobacco. He reports that he does not drink alcohol and does not use drugs.  Allergies:  Allergies  Allergen Reactions   Statins Other (See Comments)    Arthralagia    Medications: I have reviewed the patient's current medications.  Results for orders placed or performed during the hospital encounter of 04/12/22 (from the past 48 hour(s))  CBC     Status: Abnormal   Collection Time: 04/12/22  7:12 PM  Result Value Ref Range   WBC 12.2 (H) 4.0 - 10.5 K/uL   RBC 4.20 (L) 4.22 - 5.81 MIL/uL   Hemoglobin 11.5 (L) 13.0 - 17.0 g/dL   HCT 34.9 (L) 39.0 - 52.0 %   MCV 83.1 80.0 - 100.0 fL   MCH 27.4 26.0 - 34.0 pg   MCHC 33.0 30.0 - 36.0 g/dL   RDW 16.0 (H) 11.5 - 15.5 %   Platelets 120 (L) 150 - 400 K/uL   nRBC 0.0 0.0 - 0.2 %  Comment: Performed at [facility], [address] [place], [place] [address]  Basic metabolic panel     Status: Abnormal   Collection Time: 04/12/22  7:12 PM  Result Value Ref Range   Sodium 131 (L) 135 - 145 mmol/L   Potassium 4.2 3.5 - 5.1 mmol/L    Comment: HEMOLYSIS AT THIS LEVEL MAY AFFECT RESULT   Chloride 100 98 - 111 mmol/L   CO2 23 22 - 32 mmol/L   Glucose, Bld 127 (H) 70 - 99 mg/dL    Comment: Glucose reference range applies only to samples taken after fasting for at least 8 hours.   BUN 16 8 - 23 mg/dL   Creatinine, Ser 0.87 0.61 - 1.24 mg/dL   Calcium 8.0 (L) 8.9 - 10.3 mg/dL   GFR, Estimated >60 >60 mL/min    Comment: (NOTE) Calculated using the CKD-EPI Creatinine Equation (2021)     Anion gap 8 5 - 15    Comment: Performed at [facility], [address] [place], [place] [address]  Protime-INR     Status: Abnormal   Collection Time: 04/12/22  7:12 PM  Result Value Ref Range   Prothrombin Time 24.5 (H) 11.4 - 15.2 seconds   INR 2.2 (H) 0.8 - 1.2    Comment: (NOTE) INR goal varies based on device and disease states. Performed at [facility], [address] [place], [place] [address]   Urinalysis, Routine w reflex microscopic     Status: Abnormal   Collection Time: 04/12/22  7:12 PM  Result Value Ref Range   Color, Urine STRAW (A) YELLOW   APPearance CLEAR (A) CLEAR   Specific Gravity, Urine 1.026 1.005 - 1.030   pH 7.0 5.0 - 8.0   Glucose, UA NEGATIVE NEGATIVE mg/dL   Hgb urine dipstick NEGATIVE NEGATIVE   Bilirubin Urine NEGATIVE NEGATIVE   Ketones, ur NEGATIVE NEGATIVE mg/dL   Protein, ur NEGATIVE NEGATIVE mg/dL   Nitrite NEGATIVE NEGATIVE   Leukocytes,Ua NEGATIVE NEGATIVE    Comment: Performed at [facility], [address], [place], [place] [address]    CT Head Wo Contrast  Result Date: 04/12/2022 CLINICAL DATA:  Fall. Facial trauma, blunt; Chest trauma, blunt; Neck trauma (Age >= 65y). Left flank bruising. Metastatic lung cancer. EXAM: CT HEAD WITHOUT CONTRAST CT CERVICAL SPINE WITHOUT CONTRAST CT CHEST, ABDOMEN AND PELVIS WITH CONTRAST TECHNIQUE: Contiguous axial images were obtained from the base of the skull through the vertex without intravenous contrast. Multidetector CT imaging of the cervical spine was performed without intravenous contrast. Multiplanar CT image reconstructions were also generated. Multidetector CT imaging of the chest, abdomen and pelvis was performed following the standard protocol during bolus administration of intravenous contrast. RADIATION DOSE REDUCTION: This exam was performed according to the departmental dose-optimization program which includes automated exposure control, adjustment of the mA  and/or kV according to patient size and/or use of iterative reconstruction technique. CONTRAST:  23mL OMNIPAQUE IOHEXOL 350 MG/ML SOLN COMPARISON:  CT chest 03/23/2022, CT chest abdomen pelvis of 02/26/2022 FINDINGS: CT HEAD FINDINGS Brain: Normal anatomic configuration. Parenchymal volume loss is commensurate with the patient's age. Mild periventricular white matter changes are present likely reflecting the sequela of small vessel ischemia. Remote lacunar infarcts are seen within the right insular cortex, right lentiform nucleus, and right cerebellum. No abnormal intra or extra-axial mass lesion or fluid collection. No abnormal mass effect or midline shift. No evidence of acute intracranial hemorrhage or infarct. Ventricular size is normal. Cerebellum unremarkable.  Vascular: No asymmetric hyperdense vasculature at the skull base. Skull: Intact Sinuses/Orbits: Paranasal sinuses are clear. Ocular lenses have been removed. Orbits are otherwise unremarkable. Other: Mastoid air cells and middle ear cavities are clear. CT CERVICAL FINDINGS Alignment: 2 mm anterolisthesis C7-T1 is likely degenerative in nature. Otherwise normal alignment. Skull base and vertebrae: Craniocervical alignment is normal. The atlantodental interval is not widened. No acute fracture of the cervical spine. Scattered sclerotic lesions are seen throughout the cervical spine in keeping with osseous metastatic disease. No pathologic fracture or significant focus of osteolysis is noted. Circumscribed lytic lesion within the C6 vertebral body superiorly is in keeping with a subchondral cyst related to degenerative disc disease. Soft tissues and spinal canal: No prevertebral fluid or swelling. No visible canal hematoma. Disc levels: There is intervertebral disc space narrowing and endplate remodeling throughout the cervical spine in keeping with changes of moderate to severe degenerative disc disease, most severe at C5-C7. Multilevel uncovertebral  arthrosis is present resulting in moderate neuroforaminal narrowing bilaterally at C5-6 and on the right at C4-5. Milder neuroforaminal narrowing noted bilaterally at C6-7. Other:  None CT CHEST FINDINGS Cardiovascular: Extensive coronary artery calcification with evidence of prior coronary artery stenting. Global cardiac size is within normal limits. No pericardial effusion. Central pulmonary arteries are of normal caliber. Mild atherosclerotic calcification within the thoracic aorta. No aortic aneurysm. Mediastinum/Nodes: Visualized thyroid is unremarkable. No pathologic thoracic adenopathy. The esophagus is unremarkable. Lungs/Pleura: Chronic collapse and consolidation of the left upper lobe, likely representing post therapy effect, is unchanged. Fiducial markers are again seen within the consolidated left upper lobe. Innumerable pulmonary nodules demonstrating a miliary distribution throughout the aerated lungs bilaterally in keeping with intra pulmonary metastatic disease appears grossly stable since prior examination small left pleural effusion has developed. No pneumothorax. Musculoskeletal: Acute fractures of the left eleventh and twelfth ribs are seen posteriorly 11 fracture appears comminuted displaced with a tiny ossific fragment extending into the left subpleural space, best seen on image # 49/2. Widespread sclerotic metastases throughout the visualized axial skeleton appear grossly stable since prior examination no pathologic fracture. Healing mid sternal fracture again noted. CT ABDOMEN PELVIS FINDINGS Hepatobiliary: Stable tiny cyst or hemangioma within the right hepatic lobe. Liver otherwise unremarkable. Gallbladder unremarkable. No intra or extrahepatic biliary ductal dilation. Pancreas: Unremarkable Spleen: There is a laceration of the upper pole the spleen measuring 3.5 cm in greatest dimension which does not extend to the splenic hilum. No active extravasation. No perisplenic hematoma or free  fluid identified. This is best characterized as a grade III AAST splenic injury. Adrenals/Urinary Tract: Adrenal glands are unremarkable. Kidneys are normal, without renal calculi, focal lesion, or hydronephrosis. Bladder is unremarkable. Stomach/Bowel: Stomach is within normal limits. Appendix appears normal. No evidence of bowel wall thickening, distention, or inflammatory changes. Vascular/Lymphatic: Aortic atherosclerosis. No enlarged abdominal or pelvic lymph nodes. Reproductive: Mild prostatic enlargement is again noted. Other: No abdominal wall hernia. Musculoskeletal: Widespread sclerotic metastases are seen throughout the visualized axial skeleton of the abdomen and pelvis with prominent osteolysis in a stable associated soft tissue mass involving the left ilium measuring at least 3.1 x 4.8 cm at axial image # 97/2. Stable periosteal soft tissue mass involving the right iliac spine again noted measuring at least 3.5 x 5.8 cm at axial image # 85/2. IMPRESSION: 1. No acute intracranial abnormality. No calvarial fracture. 2. No acute fracture or listhesis of the cervical spine. 3. Acute fractures of the left eleventh and twelfth ribs posteriorly. Small left  pleural effusion. No pneumothorax. 4. Grade III AAST splenic injury. No perisplenic hematoma or free fluid. No active extravasation. 5. Extensive pulmonary and osseous metastatic disease, stable since prior examination. Electronically Signed   By: Fidela Salisbury M.D.   On: 04/12/2022 20:05   CT Cervical Spine Wo Contrast  Result Date: 04/12/2022 CLINICAL DATA:  Fall. Facial trauma, blunt; Chest trauma, blunt; Neck trauma (Age >= 65y). Left flank bruising. Metastatic lung cancer. EXAM: CT HEAD WITHOUT CONTRAST CT CERVICAL SPINE WITHOUT CONTRAST CT CHEST, ABDOMEN AND PELVIS WITH CONTRAST TECHNIQUE: Contiguous axial images were obtained from the base of the skull through the vertex without intravenous contrast. Multidetector CT imaging of the cervical  spine was performed without intravenous contrast. Multiplanar CT image reconstructions were also generated. Multidetector CT imaging of the chest, abdomen and pelvis was performed following the standard protocol during bolus administration of intravenous contrast. RADIATION DOSE REDUCTION: This exam was performed according to the departmental dose-optimization program which includes automated exposure control, adjustment of the mA and/or kV according to patient size and/or use of iterative reconstruction technique. CONTRAST:  38mL OMNIPAQUE IOHEXOL 350 MG/ML SOLN COMPARISON:  CT chest 03/23/2022, CT chest abdomen pelvis of 02/26/2022 FINDINGS: CT HEAD FINDINGS Brain: Normal anatomic configuration. Parenchymal volume loss is commensurate with the patient's age. Mild periventricular white matter changes are present likely reflecting the sequela of small vessel ischemia. Remote lacunar infarcts are seen within the right insular cortex, right lentiform nucleus, and right cerebellum. No abnormal intra or extra-axial mass lesion or fluid collection. No abnormal mass effect or midline shift. No evidence of acute intracranial hemorrhage or infarct. Ventricular size is normal. Cerebellum unremarkable. Vascular: No asymmetric hyperdense vasculature at the skull base. Skull: Intact Sinuses/Orbits: Paranasal sinuses are clear. Ocular lenses have been removed. Orbits are otherwise unremarkable. Other: Mastoid air cells and middle ear cavities are clear. CT CERVICAL FINDINGS Alignment: 2 mm anterolisthesis C7-T1 is likely degenerative in nature. Otherwise normal alignment. Skull base and vertebrae: Craniocervical alignment is normal. The atlantodental interval is not widened. No acute fracture of the cervical spine. Scattered sclerotic lesions are seen throughout the cervical spine in keeping with osseous metastatic disease. No pathologic fracture or significant focus of osteolysis is noted. Circumscribed lytic lesion within the  C6 vertebral body superiorly is in keeping with a subchondral cyst related to degenerative disc disease. Soft tissues and spinal canal: No prevertebral fluid or swelling. No visible canal hematoma. Disc levels: There is intervertebral disc space narrowing and endplate remodeling throughout the cervical spine in keeping with changes of moderate to severe degenerative disc disease, most severe at C5-C7. Multilevel uncovertebral arthrosis is present resulting in moderate neuroforaminal narrowing bilaterally at C5-6 and on the right at C4-5. Milder neuroforaminal narrowing noted bilaterally at C6-7. Other:  None CT CHEST FINDINGS Cardiovascular: Extensive coronary artery calcification with evidence of prior coronary artery stenting. Global cardiac size is within normal limits. No pericardial effusion. Central pulmonary arteries are of normal caliber. Mild atherosclerotic calcification within the thoracic aorta. No aortic aneurysm. Mediastinum/Nodes: Visualized thyroid is unremarkable. No pathologic thoracic adenopathy. The esophagus is unremarkable. Lungs/Pleura: Chronic collapse and consolidation of the left upper lobe, likely representing post therapy effect, is unchanged. Fiducial markers are again seen within the consolidated left upper lobe. Innumerable pulmonary nodules demonstrating a miliary distribution throughout the aerated lungs bilaterally in keeping with intra pulmonary metastatic disease appears grossly stable since prior examination small left pleural effusion has developed. No pneumothorax. Musculoskeletal: Acute fractures of the left eleventh and  twelfth ribs are seen posteriorly 11 fracture appears comminuted displaced with a tiny ossific fragment extending into the left subpleural space, best seen on image # 49/2. Widespread sclerotic metastases throughout the visualized axial skeleton appear grossly stable since prior examination no pathologic fracture. Healing mid sternal fracture again noted. CT  ABDOMEN PELVIS FINDINGS Hepatobiliary: Stable tiny cyst or hemangioma within the right hepatic lobe. Liver otherwise unremarkable. Gallbladder unremarkable. No intra or extrahepatic biliary ductal dilation. Pancreas: Unremarkable Spleen: There is a laceration of the upper pole the spleen measuring 3.5 cm in greatest dimension which does not extend to the splenic hilum. No active extravasation. No perisplenic hematoma or free fluid identified. This is best characterized as a grade III AAST splenic injury. Adrenals/Urinary Tract: Adrenal glands are unremarkable. Kidneys are normal, without renal calculi, focal lesion, or hydronephrosis. Bladder is unremarkable. Stomach/Bowel: Stomach is within normal limits. Appendix appears normal. No evidence of bowel wall thickening, distention, or inflammatory changes. Vascular/Lymphatic: Aortic atherosclerosis. No enlarged abdominal or pelvic lymph nodes. Reproductive: Mild prostatic enlargement is again noted. Other: No abdominal wall hernia. Musculoskeletal: Widespread sclerotic metastases are seen throughout the visualized axial skeleton of the abdomen and pelvis with prominent osteolysis in a stable associated soft tissue mass involving the left ilium measuring at least 3.1 x 4.8 cm at axial image # 97/2. Stable periosteal soft tissue mass involving the right iliac spine again noted measuring at least 3.5 x 5.8 cm at axial image # 85/2. IMPRESSION: 1. No acute intracranial abnormality. No calvarial fracture. 2. No acute fracture or listhesis of the cervical spine. 3. Acute fractures of the left eleventh and twelfth ribs posteriorly. Small left pleural effusion. No pneumothorax. 4. Grade III AAST splenic injury. No perisplenic hematoma or free fluid. No active extravasation. 5. Extensive pulmonary and osseous metastatic disease, stable since prior examination. Electronically Signed   By: Fidela Salisbury M.D.   On: 04/12/2022 20:05   CT CHEST ABDOMEN PELVIS W  CONTRAST  Result Date: 04/12/2022 CLINICAL DATA:  Fall. Facial trauma, blunt; Chest trauma, blunt; Neck trauma (Age >= 65y). Left flank bruising. Metastatic lung cancer. EXAM: CT HEAD WITHOUT CONTRAST CT CERVICAL SPINE WITHOUT CONTRAST CT CHEST, ABDOMEN AND PELVIS WITH CONTRAST TECHNIQUE: Contiguous axial images were obtained from the base of the skull through the vertex without intravenous contrast. Multidetector CT imaging of the cervical spine was performed without intravenous contrast. Multiplanar CT image reconstructions were also generated. Multidetector CT imaging of the chest, abdomen and pelvis was performed following the standard protocol during bolus administration of intravenous contrast. RADIATION DOSE REDUCTION: This exam was performed according to the departmental dose-optimization program which includes automated exposure control, adjustment of the mA and/or kV according to patient size and/or use of iterative reconstruction technique. CONTRAST:  69mL OMNIPAQUE IOHEXOL 350 MG/ML SOLN COMPARISON:  CT chest 03/23/2022, CT chest abdomen pelvis of 02/26/2022 FINDINGS: CT HEAD FINDINGS Brain: Normal anatomic configuration. Parenchymal volume loss is commensurate with the patient's age. Mild periventricular white matter changes are present likely reflecting the sequela of small vessel ischemia. Remote lacunar infarcts are seen within the right insular cortex, right lentiform nucleus, and right cerebellum. No abnormal intra or extra-axial mass lesion or fluid collection. No abnormal mass effect or midline shift. No evidence of acute intracranial hemorrhage or infarct. Ventricular size is normal. Cerebellum unremarkable. Vascular: No asymmetric hyperdense vasculature at the skull base. Skull: Intact Sinuses/Orbits: Paranasal sinuses are clear. Ocular lenses have been removed. Orbits are otherwise unremarkable. Other: Mastoid air cells and middle  ear cavities are clear. CT CERVICAL FINDINGS Alignment: 2 mm  anterolisthesis C7-T1 is likely degenerative in nature. Otherwise normal alignment. Skull base and vertebrae: Craniocervical alignment is normal. The atlantodental interval is not widened. No acute fracture of the cervical spine. Scattered sclerotic lesions are seen throughout the cervical spine in keeping with osseous metastatic disease. No pathologic fracture or significant focus of osteolysis is noted. Circumscribed lytic lesion within the C6 vertebral body superiorly is in keeping with a subchondral cyst related to degenerative disc disease. Soft tissues and spinal canal: No prevertebral fluid or swelling. No visible canal hematoma. Disc levels: There is intervertebral disc space narrowing and endplate remodeling throughout the cervical spine in keeping with changes of moderate to severe degenerative disc disease, most severe at C5-C7. Multilevel uncovertebral arthrosis is present resulting in moderate neuroforaminal narrowing bilaterally at C5-6 and on the right at C4-5. Milder neuroforaminal narrowing noted bilaterally at C6-7. Other:  None CT CHEST FINDINGS Cardiovascular: Extensive coronary artery calcification with evidence of prior coronary artery stenting. Global cardiac size is within normal limits. No pericardial effusion. Central pulmonary arteries are of normal caliber. Mild atherosclerotic calcification within the thoracic aorta. No aortic aneurysm. Mediastinum/Nodes: Visualized thyroid is unremarkable. No pathologic thoracic adenopathy. The esophagus is unremarkable. Lungs/Pleura: Chronic collapse and consolidation of the left upper lobe, likely representing post therapy effect, is unchanged. Fiducial markers are again seen within the consolidated left upper lobe. Innumerable pulmonary nodules demonstrating a miliary distribution throughout the aerated lungs bilaterally in keeping with intra pulmonary metastatic disease appears grossly stable since prior examination small left pleural effusion has  developed. No pneumothorax. Musculoskeletal: Acute fractures of the left eleventh and twelfth ribs are seen posteriorly 11 fracture appears comminuted displaced with a tiny ossific fragment extending into the left subpleural space, best seen on image # 49/2. Widespread sclerotic metastases throughout the visualized axial skeleton appear grossly stable since prior examination no pathologic fracture. Healing mid sternal fracture again noted. CT ABDOMEN PELVIS FINDINGS Hepatobiliary: Stable tiny cyst or hemangioma within the right hepatic lobe. Liver otherwise unremarkable. Gallbladder unremarkable. No intra or extrahepatic biliary ductal dilation. Pancreas: Unremarkable Spleen: There is a laceration of the upper pole the spleen measuring 3.5 cm in greatest dimension which does not extend to the splenic hilum. No active extravasation. No perisplenic hematoma or free fluid identified. This is best characterized as a grade III AAST splenic injury. Adrenals/Urinary Tract: Adrenal glands are unremarkable. Kidneys are normal, without renal calculi, focal lesion, or hydronephrosis. Bladder is unremarkable. Stomach/Bowel: Stomach is within normal limits. Appendix appears normal. No evidence of bowel wall thickening, distention, or inflammatory changes. Vascular/Lymphatic: Aortic atherosclerosis. No enlarged abdominal or pelvic lymph nodes. Reproductive: Mild prostatic enlargement is again noted. Other: No abdominal wall hernia. Musculoskeletal: Widespread sclerotic metastases are seen throughout the visualized axial skeleton of the abdomen and pelvis with prominent osteolysis in a stable associated soft tissue mass involving the left ilium measuring at least 3.1 x 4.8 cm at axial image # 97/2. Stable periosteal soft tissue mass involving the right iliac spine again noted measuring at least 3.5 x 5.8 cm at axial image # 85/2. IMPRESSION: 1. No acute intracranial abnormality. No calvarial fracture. 2. No acute fracture or  listhesis of the cervical spine. 3. Acute fractures of the left eleventh and twelfth ribs posteriorly. Small left pleural effusion. No pneumothorax. 4. Grade III AAST splenic injury. No perisplenic hematoma or free fluid. No active extravasation. 5. Extensive pulmonary and osseous metastatic disease, stable since prior examination.  Electronically Signed   By: Fidela Salisbury M.D.   On: 04/12/2022 20:05   DG Chest Portable 1 View  Result Date: 04/12/2022 CLINICAL DATA:  Left lower rib pain after injury.  Fall at home. EXAM: PORTABLE CHEST 1 VIEW COMPARISON:  03/23/2022 FINDINGS: Heart size and pulmonary vascularity are normal. Surgical clips in the left suprahilar region with soft tissue prominence along the left paratracheal and aortopulmonic window region likely representing postradiation changes or associated treated disease. Diffuse nodular parenchymal pattern to the lungs is similar to prior study and correlates with known pulmonary nodules. No developing consolidation or edema. No pleural effusions. No pneumothorax. Visualized ribs are grossly nondisplaced. IMPRESSION: Unchanged appearance of the chest since previous study. Again demonstrated is soft tissue density in the left mediastinum corresponding to known treated disease. Diffuse nodular parenchymal pattern to the lungs corresponding to known multiple pulmonary nodules. Electronically Signed   By: Lucienne Capers M.D.   On: 04/12/2022 19:27    Review of Systems  Constitutional: Negative.   HENT: Negative.    Eyes: Negative.   Respiratory: Negative.    Cardiovascular: Negative.   Gastrointestinal: Negative.   Genitourinary: Negative.   Musculoskeletal:  Positive for falls.  Skin: Negative.   Neurological: Negative.   Endo/Heme/Allergies: Negative.   Psychiatric/Behavioral: Negative.      PE Blood pressure 140/73, pulse 95, temperature 98.2 F (36.8 C), temperature source Oral, resp. rate 19, height 5\' 11"  (1.803 m), weight 56.5 kg,  SpO2 97 %. Constitutional: NAD; conversant; no deformities Eyes: Moist conjunctiva; no lid lag; anicteric; PERRL Neck: Trachea midline; no thyromegaly, no cervicalgia Lungs: Normal respiratory effort; no tactile fremitus CV: RRR; no palpable thrills; no pitting edema GI: Abd soft, NT, ND; no palpable hepatosplenomegaly MSK: unable to assess gait; no clubbing/cyanosis Psychiatric: Appropriate affect; alert and oriented x3 Lymphatic: No palpable cervical or axillary lymphadenopathy   Assessment/Plan: 88 yo male with fall  Splenic laceration - repeat labs in the morning, monitor closely Rib fractures - pain control, repeat XR shows no change Hemothorax - repeat XR shows no  change Recent PE - hold anticoagulation for now Advanced lung cancer - patient still wishes to be full code  FEN- NPO VTE- hold anticoagulation ID- no issues Dispo- ICU  Procedures: none  I reviewed last 24 h vitals and pain scores, last 48 h intake and output, last 24 h labs and trends, and last 24 h imaging results.  This care required high  level of medical decision making.   Arta Bruce Neya Creegan 04/12/2022, 11:16 PM

## 2022-04-12 NOTE — Progress Notes (Signed)
[place] at [facility] [phone] [phone]   Name: Scott Gross Date: 04/12/2022 MRN: [id number]  DOB: 04-16-1934  Patient Care Team: [name], MD as PCP - General (Family Medicine) [name], MD as Consulting Physician (Oncology)    REASON FOR CONSULTATION: Scott Gross is a 88 y.o. 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  He was referred to palliative care to help address goals and manage ongoing symptoms.  SOCIAL HISTORY:     reports that he quit smoking about 10 years ago. His smoking use included cigarettes. He smoked an average of 1 pack per day. He has never used smokeless tobacco. He reports that he does not drink alcohol and does not use drugs.  Patient is married and lives at home with his wife.  He has a daughter who is involved.  ADVANCE DIRECTIVES:  None on file  CODE STATUS:   PAST MEDICAL HISTORY: Past Medical History:  Diagnosis Date   Arthritis    CAD (coronary artery disease) Nov. 12, 2012   Inferior ST elevation MI in November of 2012 with ventricular fibrillation arrest. Status post drug-eluting stent placement to the mid LAD. Most recent cardiac catheterization in June of this year showed patent stent with minimal restenosis, 75% proximal LAD and 80% distal LAD which were unchanged from before. Mild disease in the left circumflex and moderate RCA disease. Ejection fraction was 55%.   Cancer of upper lobe of left lung ([place]) 11/2016   Rad tx's.   Cardiac arrest [facility]) Nov. 2012   x 2    Cardiac arrest - ventricular fibrillation    HOH (hard of hearing)    Left Hearing Aid   Hyperlipidemia    Hypertension    Post PTCA 2013   x2   ST elevation MI (STEMI) ([place]) 08/26/2011    PAST SURGICAL HISTORY:  Past Surgical History:  Procedure  Laterality Date   CARDIAC CATHETERIZATION  2013   @ [place]; [place] s/p stent    CORONARY ANGIOPLASTY     CORONARY/GRAFT ACUTE MI REVASCULARIZATION N/A 06/06/2021   Procedure: Coronary/Graft Acute MI Revascularization;  Surgeon: Wellington Hampshire, MD;  Location: [place] [facility];  Service: Cardiovascular;  Laterality: N/A;   ELECTROMAGNETIC NAVIGATION BROCHOSCOPY N/A 11/27/2016   Procedure: ELECTROMAGNETIC NAVIGATION BRONCHOSCOPY;  Surgeon: [name], MD;  Location: [facility] ORS;  Service: Cardiopulmonary;  Laterality: N/A;   LEFT HEART CATH AND CORONARY ANGIOGRAPHY N/A 06/06/2021   Procedure: LEFT HEART CATH AND CORONARY ANGIOGRAPHY;  Surgeon: Wellington Hampshire, MD;  Location: [place] [facility];  Service: Cardiovascular;  Laterality: N/A;    HEMATOLOGY/ONCOLOGY HISTORY:  Oncology History  Cancer of upper lobe of left lung ([place])  09/10/2016 Initial Diagnosis   Cancer of upper lobe of left lung ([place])   12/06/2021 -  Chemotherapy   Patient is on Treatment Plan : LUNG NSCLC Pembrolizumab (200) q21d       ALLERGIES:  is allergic to statins.  MEDICATIONS:  Current Outpatient Medications  Medication Sig Dispense Refill   albuterol (PROVENTIL) (2.5 MG/3ML) 0.083% nebulizer solution SMARTSIG:3 Milliliter(s) Via Nebulizer Every 6 Hours PRN     albuterol (VENTOLIN HFA) 108 (90 Base) MCG/ACT inhaler Inhale into the lungs every 6 (six) hours as needed.     amiodarone (PACERONE) 200 MG tablet Take 1 tablet (200 mg total)  by mouth 2 (two) times daily. (Patient taking differently: Take 100 mg by mouth daily.) 60 tablet 0   amLODipine (NORVASC) 5 MG tablet Take by mouth.     clotrimazole-betamethasone (LOTRISONE) cream Apply to affected area 2 times daily 15 g 1   enoxaparin (LOVENOX) 100 MG/ML injection Inject 0.975 mLs (97.5 mg total) into the skin daily. 29.25 mL 0   guaiFENesin (MUCINEX) 600 MG 12 hr tablet Take 600 mg by mouth 2 (two) times daily as needed for cough or to loosen phlegm.      losartan-hydrochlorothiazide (HYZAAR) 50-12.5 MG tablet Take 1 tablet by mouth daily.     mirtazapine (REMERON) 7.5 MG tablet Take by mouth.     naproxen sodium (ALEVE) 220 MG tablet Take 220 mg by mouth 2 (two) times daily with a meal.     potassium chloride SA (KLOR-CON M) 20 MEQ tablet Take 1 tablet (20 mEq total) by mouth daily. 30 tablet 3   rivaroxaban (XARELTO) 20 MG TABS tablet Take 1 tablet (20 mg total) by mouth daily with supper. 30 tablet 5   rosuvastatin (CRESTOR) 5 MG tablet Take 2.5 mg by mouth daily.     torsemide (DEMADEX) 20 MG tablet Take 1 tablet (20 mg total) by mouth daily. 30 tablet 0   traMADol (ULTRAM) 50 MG tablet Take 1 tablet (50 mg total) by mouth 2 (two) times daily. As needed for pain. 60 tablet 0   No current facility-administered medications for this visit.    VITAL SIGNS: There were no vitals taken for this visit. There were no vitals filed for this visit.   Estimated body mass index is 17.1 kg/m as calculated from the following:   Height as of an earlier encounter on 04/12/22: 5\' 11"  (1.803 m).   Weight as of an earlier encounter on 04/12/22: 122 lb 9.6 oz (55.6 kg).  LABS: CBC:    Component Value Date/Time   WBC 11.5 (H) 04/12/2022 1044   HGB 13.3 04/12/2022 1044   HGB 17.0 11/28/2013 0605   HCT 39.8 04/12/2022 1044   HCT 51.4 11/28/2013 0605   PLT 140 (L) 04/12/2022 1044   PLT 167 11/28/2013 0605   MCV 82.9 04/12/2022 1044   MCV 92 11/28/2013 0605   NEUTROABS 9.0 (H) 04/12/2022 1044   NEUTROABS 6.0 11/28/2013 0605   LYMPHSABS 0.9 04/12/2022 1044   LYMPHSABS 4.7 (H) 11/28/2013 0605   MONOABS 1.0 04/12/2022 1044   MONOABS 0.8 11/28/2013 0605   EOSABS 0.3 04/12/2022 1044   EOSABS 0.3 11/28/2013 0605   BASOSABS 0.1 04/12/2022 1044   BASOSABS 0.1 11/28/2013 0605   Comprehensive Metabolic Panel:    Component Value Date/Time   NA 129 (L) 04/12/2022 1044   NA 137 11/28/2013 0605   K 4.2 04/12/2022 1044   K 3.6 11/28/2013 0605   CL 95 (L)  04/12/2022 1044   CL 105 11/28/2013 0605   CO2 25 04/12/2022 1044   CO2 29 11/28/2013 0605   BUN 18 04/12/2022 1044   BUN 9 11/28/2013 0605   CREATININE 0.98 04/12/2022 1044   CREATININE 1.07 11/28/2013 0605   GLUCOSE 130 (H) 04/12/2022 1044   GLUCOSE 153 (H) 11/28/2013 0605   CALCIUM 8.2 (L) 04/12/2022 1044   CALCIUM 8.7 11/28/2013 0605   AST 39 04/12/2022 1044   AST 33 11/28/2013 0605   ALT 38 04/12/2022 1044   ALT 36 11/28/2013 0605   ALKPHOS 342 (H) 04/12/2022 1044   ALKPHOS 99 11/28/2013 0962  BILITOT 1.0 04/12/2022 1044   BILITOT 0.6 11/28/2013 0605   PROT 6.6 04/12/2022 1044   PROT 7.4 11/28/2013 0605   ALBUMIN 3.6 04/12/2022 1044   ALBUMIN 3.9 11/28/2013 0605    RADIOGRAPHIC STUDIES: ECHOCARDIOGRAM COMPLETE  Result Date: 03/24/2022    ECHOCARDIOGRAM REPORT   Patient Name:   BRAYDIN ALOI Date of Exam: 03/24/2022 Medical Rec #:  [id number]       Height:       71.0 in Accession #:    [id number]      Weight:       128.1 lb Date of Birth:  03/16/1934       BSA:          1.745 m Patient Age:    22 years        BP:           156/81 mmHg Patient Gender: M               HR:           82 bpm. Exam Location:  [facility] Procedure: 2D Echo Indications:     Pulmonary Embolus I26.09  History:         Patient has prior history of Echocardiogram examinations, most                  recent 06/06/2021.  Sonographer:     Kathlen Brunswick RDCS Referring Phys:  [place] Diagnosing Phys: [place]  1. Left ventricular ejection fraction, by estimation, is 45 to 50%. The left ventricle has mildly decreased function. The left ventricle demonstrates global hypokinesis. There is mild concentric left ventricular hypertrophy. Left ventricular diastolic parameters are consistent with Grade I diastolic dysfunction (impaired relaxation).  2. Right ventricular systolic function is normal. The right ventricular size is normal.  3. Left atrial size was mildly dilated.  4. Right atrial size was  mildly dilated.  5. The mitral valve is normal in structure. Trivial mitral valve regurgitation. No evidence of mitral stenosis.  6. The aortic valve is normal in structure. Aortic valve regurgitation is mild to moderate. Aortic valve sclerosis is present, with no evidence of aortic valve stenosis.  7. The inferior vena cava is normal in size with greater than 50% respiratory variability, suggesting right atrial pressure of 3 mmHg. FINDINGS  Left Ventricle: Left ventricular ejection fraction, by estimation, is 45 to 50%. The left ventricle has mildly decreased function. The left ventricle demonstrates global hypokinesis. The left ventricular internal cavity size was normal in size. There is  mild concentric left ventricular hypertrophy. Left ventricular diastolic parameters are consistent with Grade I diastolic dysfunction (impaired relaxation). Right Ventricle: The right ventricular size is normal. No increase in right ventricular wall thickness. Right ventricular systolic function is normal. Left Atrium: Left atrial size was mildly dilated. Right Atrium: Right atrial size was mildly dilated. Pericardium: There is no evidence of pericardial effusion. Mitral Valve: The mitral valve is normal in structure. Trivial mitral valve regurgitation. No evidence of mitral valve stenosis. Tricuspid Valve: The tricuspid valve is normal in structure. Tricuspid valve regurgitation is trivial. No evidence of tricuspid stenosis. Aortic Valve: The aortic valve is normal in structure. Aortic valve regurgitation is mild to moderate. Aortic regurgitation PHT measures 508 msec. Aortic valve sclerosis is present, with no evidence of aortic valve stenosis. Aortic valve peak gradient measures 6.8 mmHg. Pulmonic Valve: The pulmonic valve was normal in structure. Pulmonic valve regurgitation is trivial. No evidence of pulmonic  stenosis. Aorta: The aortic root is normal in size and structure. Venous: The inferior vena cava is normal in size  with greater than 50% respiratory variability, suggesting right atrial pressure of 3 mmHg. IAS/Shunts: No atrial level shunt detected by color flow Doppler.  LEFT VENTRICLE PLAX 2D LVIDd:         3.56 cm     Diastology LVIDs:         2.77 cm     LV e' medial:    3.81 cm/s LV PW:         1.00 cm     LV E/e' medial:  12.3 LV IVS:        0.78 cm     LV e' lateral:   3.92 cm/s LVOT diam:     1.90 cm     LV E/e' lateral: 11.9 LV SV:         44 LV SV Index:   25 LVOT Area:     2.84 cm  LV Volumes (MOD) LV vol d, MOD A2C: 39.6 ml LV vol d, MOD A4C: 84.2 ml LV vol s, MOD A2C: 22.0 ml LV vol s, MOD A4C: 42.0 ml LV SV MOD A2C:     17.6 ml LV SV MOD A4C:     84.2 ml LV SV MOD BP:      29.5 ml RIGHT VENTRICLE RV Basal diam:  2.50 cm RV S prime:     12.00 cm/s TAPSE (M-mode): 2.4 cm LEFT ATRIUM             Index        RIGHT ATRIUM          Index LA diam:        2.50 cm 1.43 cm/m   RA Area:     7.92 cm LA Vol (A2C):   35.1 ml 20.12 ml/m  RA Volume:   11.50 ml 6.59 ml/m LA Vol (A4C):   22.3 ml 12.78 ml/m LA Biplane Vol: 29.0 ml 16.62 ml/m  AORTIC VALVE                 PULMONIC VALVE AV Area (Vmax): 1.72 cm     PV Vmax:       0.98 m/s AV Vmax:        130.00 cm/s  PV Peak grad:  3.8 mmHg AV Peak Grad:   6.8 mmHg LVOT Vmax:      79.00 cm/s LVOT Vmean:     55.300 cm/s LVOT VTI:       0.154 m AI PHT:         508 msec  AORTA Ao Root diam: 2.90 cm MITRAL VALVE MV Area (PHT): 4.77 cm    SHUNTS MV Decel Time: 159 msec    Systemic VTI:  0.15 m MV E velocity: 46.70 cm/s  Systemic Diam: 1.90 cm MV A velocity: 67.70 cm/s MV E/A ratio:  0.69 Shaukat Khan Electronically signed by Neoma Laming Signature Date/Time: 03/24/2022/5:13:51 PM    Final    US Venous Img Lower Bilateral (DVT)  Result Date: 03/24/2022 CLINICAL DATA:  Lower extremity edema EXAM: BILATERAL LOWER EXTREMITY VENOUS DOPPLER ULTRASOUND TECHNIQUE: Gray-scale sonography with graded compression, as well as color Doppler and duplex ultrasound were performed to evaluate  the lower extremity deep venous systems from the level of the common femoral vein and including the common femoral, femoral, profunda femoral, popliteal and calf veins including the posterior tibial, peroneal and gastrocnemius veins when visible. Spectral Doppler  was utilized to evaluate flow at rest and with distal augmentation maneuvers in the common femoral, femoral and popliteal veins. COMPARISON:  None Available. FINDINGS: RIGHT LOWER EXTREMITY Common Femoral Vein: No evidence of thrombus. Normal compressibility, respiratory phasicity and response to augmentation. Saphenofemoral Junction: No evidence of thrombus. Normal compressibility and flow on color Doppler imaging. Profunda Femoral Vein: No evidence of thrombus. Normal compressibility and flow on color Doppler imaging. Femoral Vein: No evidence of thrombus. Normal compressibility, respiratory phasicity and response to augmentation. Popliteal Vein: No evidence of thrombus. Normal compressibility, respiratory phasicity and response to augmentation. Calf Veins: No evidence of thrombus. Normal compressibility and flow on color Doppler imaging. LEFT LOWER EXTREMITY Common Femoral Vein: No evidence of thrombus. Normal compressibility, respiratory phasicity and response to augmentation. Saphenofemoral Junction: No evidence of thrombus. Normal compressibility and flow on color Doppler imaging. Profunda Femoral Vein: No evidence of thrombus. Normal compressibility and flow on color Doppler imaging. Femoral Vein: No evidence of thrombus. Normal compressibility, respiratory phasicity and response to augmentation. Popliteal Vein: No evidence of thrombus. Normal compressibility, respiratory phasicity and response to augmentation. Calf Veins: No evidence of thrombus. Normal compressibility and flow on color Doppler imaging. IMPRESSION: No evidence of deep venous thrombosis in either lower extremity. Electronically Signed   By: Jerilynn Mages.  Shick M.D.   On: 03/24/2022 16:15    CT Angio Chest PE W and/or Wo Contrast  Result Date: 03/23/2022 CLINICAL DATA:  Chest pain, pulmonary embolism suspected EXAM: CT ANGIOGRAPHY CHEST WITH CONTRAST TECHNIQUE: Multidetector CT imaging of the chest was performed using the standard protocol during bolus administration of intravenous contrast. Multiplanar CT image reconstructions and MIPs were obtained to evaluate the vascular anatomy. RADIATION DOSE REDUCTION: This exam was performed according to the departmental dose-optimization program which includes automated exposure control, adjustment of the mA and/or kV according to patient size and/or use of iterative reconstruction technique. CONTRAST:  71mL OMNIPAQUE IOHEXOL 350 MG/ML SOLN COMPARISON:  02/26/2022 CT chest abdomen pelvis. FINDINGS: Cardiovascular: Satisfactory opacification of the pulmonary arteries to the segmental level. Thrombus is noted in a right lower lobe posterior segment artery (series 5, image 286). Evidence of right heart strain with an RV:LV ratio of 1.13 and some reflux of contrast into the hepatic veins, although the appearance of the right ventricle appears similar to 02/26/2022. Chronic stenosis of some left upper lobe pulmonary arteries (series 5, image 174), related to chronic collapse of the posterior left upper lobe, which also demonstrates radiation fiducials. Normal heart size. Thrombus in the left atrial appendage (series 5, image 196), which is new compared to 02/26/2022. No pericardial effusion. Mediastinum/Nodes: No enlarged mediastinal, hilar, or axillary lymph nodes. Thyroid gland, trachea, and esophagus demonstrate no significant findings. Lungs/Pleura: Redemonstrated innumerable pulmonary nodules, which do seem to have increased in number consistent with metastatic disease. Redemonstrated chronic collapse of a portion of the posterior left upper lobe with radiation fiducials. Upper Abdomen: No acute abnormality. Musculoskeletal: Review of the MIP images  confirms the above findings. IMPRESSION: 1. Positive for acute PE in a right lower lobe posterior segment artery with CT evidence of right heart strain (RV/LV Ratio = 1.13) consistent with at least submassive (intermediate risk) PE. The presence of right heart strain has been associated with an increased risk of morbidity and mortality. Please refer to the "Code PE Focused" order set in EPIC. Given the small amount of actual pulmonary thrombus and a similar appearance of the right ventricle on the 02/26/2022 CT, this may be chronic and in part  related to stenosis of the left upper lobe pulmonary arteries, secondary to postradiation changes in the left lung. 2. Thrombus in the left atrial appendage, which is new compared to 02/26/2022. 3. Innumerable pulmonary nodules, which have increased in number compared to 02/26/2022, consistent with metastatic disease. These results were called by telephone at the time of interpretation on 03/23/2022 at 12:28 pm to provider [facility] , who verbally acknowledged these results. Electronically Signed   By: Merilyn Baba M.D.   On: 03/23/2022 12:31   DG Chest Portable 1 View  Result Date: 03/23/2022 CLINICAL DATA:  Chest pain. EXAM: PORTABLE CHEST 1 VIEW COMPARISON:  Prior chest x-ray on 08/01/2021 and CT of the chest on 02/26/2022 FINDINGS: Normal heart size. Stable emphysematous lung disease and again suggestion of multiple bilateral pulmonary nodules as depicted by recent CT. Stable chronic collapse of the medial left upper lobe. No pneumothorax, overt pulmonary edema or pleural fluid identified. IMPRESSION: Stable emphysematous lung disease, multiple bilateral pulmonary nodules and chronic collapse of the medial left upper lobe. Electronically Signed   By: Aletta Edouard M.D.   On: 03/23/2022 10:40    PERFORMANCE STATUS (ECOG) : 2  Review of Systems Unless otherwise noted, a complete review of systems is negative.  Physical Exam General: NAD, thin,  frail-appearing Pulmonary: Unlabored Extremities: no edema, no joint deformities Skin: no rashes Neurological: Weakness but otherwise nonfocal  IMPRESSION: Patient seen in infusion.  Case discussed with Dr. Grayland Ormond.  Patient is followed at home by palliative care.  Unfortunately, patient is doing poorly with declining performance status.  He is requiring significant assistance with ADLs and ambulation.  Patient was not felt to be a candidate for treatment today and likely treatment options would be challenging in light of his severe weakness.  Dr. Grayland Ormond discussed option of hospice but reportedly family was undecided.  Home health has also been ordered.  Tried to discuss goals with patient but his memory impairment is such that it made this challenging. Family will likely need to decide how to proceed.   PLAN: -Continue current scope of treatment -Will defer to community palliative care -RTC as needed  Case and plan discussed with Dr. Grayland Ormond  Patient expressed understanding and was in agreement with this plan. He also understands that He can call the clinic at any time with any questions, concerns, or complaints.     Time Total: 15 minutes  Visit consisted of counseling and education dealing with the complex and emotionally intense issues of symptom management and palliative care in the setting of serious and potentially life-threatening illness.Greater than 50%  of this time was spent counseling and coordinating care related to the above assessment and plan.  Signed by: Altha Harm, PhD, NP-C

## 2022-04-12 NOTE — ED Notes (Signed)
Received bed assignment from [facility]

## 2022-04-13 ENCOUNTER — Telehealth: Payer: Self-pay | Admitting: Oncology

## 2022-04-13 ENCOUNTER — Encounter: Payer: Self-pay | Admitting: Oncology

## 2022-04-13 ENCOUNTER — Inpatient Hospital Stay (HOSPITAL_COMMUNITY): Payer: Medicare HMO

## 2022-04-13 DIAGNOSIS — Y939 Activity, unspecified: Secondary | ICD-10-CM

## 2022-04-13 DIAGNOSIS — I2699 Other pulmonary embolism without acute cor pulmonale: Secondary | ICD-10-CM

## 2022-04-13 DIAGNOSIS — R531 Weakness: Secondary | ICD-10-CM | POA: Diagnosis not present

## 2022-04-13 DIAGNOSIS — Z86711 Personal history of pulmonary embolism: Secondary | ICD-10-CM | POA: Diagnosis not present

## 2022-04-13 DIAGNOSIS — C3412 Malignant neoplasm of upper lobe, left bronchus or lung: Secondary | ICD-10-CM

## 2022-04-13 DIAGNOSIS — Y92009 Unspecified place in unspecified non-institutional (private) residence as the place of occurrence of the external cause: Secondary | ICD-10-CM

## 2022-04-13 DIAGNOSIS — S36039A Unspecified laceration of spleen, initial encounter: Secondary | ICD-10-CM

## 2022-04-13 DIAGNOSIS — W19XXXA Unspecified fall, initial encounter: Secondary | ICD-10-CM

## 2022-04-13 LAB — CBC
HCT: 33.8 % — ABNORMAL LOW (ref 39.0–52.0)
Hemoglobin: 11.7 g/dL — ABNORMAL LOW (ref 13.0–17.0)
MCH: 28.5 pg (ref 26.0–34.0)
MCHC: 34.6 g/dL (ref 30.0–36.0)
MCV: 82.2 fL (ref 80.0–100.0)
Platelets: 120 10*3/uL — ABNORMAL LOW (ref 150–400)
RBC: 4.11 MIL/uL — ABNORMAL LOW (ref 4.22–5.81)
RDW: 16.4 % — ABNORMAL HIGH (ref 11.5–15.5)
WBC: 12.1 10*3/uL — ABNORMAL HIGH (ref 4.0–10.5)
nRBC: 0 % (ref 0.0–0.2)

## 2022-04-13 LAB — TRAUMA TEG PANEL
CFF Max Amplitude: 15.5 mm (ref 15–32)
Citrated Kaolin (R): 4.6 min (ref 4.6–9.1)
Citrated Rapid TEG (MA): 51.9 mm — ABNORMAL LOW (ref 52–70)
Lysis at 30 Minutes: 0 % (ref 0.0–2.6)

## 2022-04-13 LAB — BASIC METABOLIC PANEL
Anion gap: 13 (ref 5–15)
BUN: 14 mg/dL (ref 8–23)
CO2: 18 mmol/L — ABNORMAL LOW (ref 22–32)
Calcium: 7.8 mg/dL — ABNORMAL LOW (ref 8.9–10.3)
Chloride: 102 mmol/L (ref 98–111)
Creatinine, Ser: 0.88 mg/dL (ref 0.61–1.24)
GFR, Estimated: >60 mL/min (ref >60)
Glucose, Bld: 104 mg/dL — ABNORMAL HIGH (ref 70–99)
Potassium: 4 mmol/L (ref 3.5–5.1)
Sodium: 133 mmol/L — ABNORMAL LOW (ref 135–145)

## 2022-04-13 LAB — MRSA NEXT GEN BY PCR, NASAL: MRSA by PCR Next Gen: NOT DETECTED

## 2022-04-13 LAB — T4: T4, Total: 8.9 ug/dL (ref 4.5–12.0)

## 2022-04-13 MED ORDER — OXYCODONE HCL 5 MG PO TABS
2.5000 mg | ORAL_TABLET | ORAL | Status: DC | PRN
  Administered 2022-04-14: 2.5 mg via ORAL
  Filled 2022-04-13: qty 1

## 2022-04-13 MED ORDER — ORAL CARE MOUTH RINSE
15.0000 mL | OROMUCOSAL | Status: DC | PRN
Start: 2022-04-13 — End: 2022-04-15

## 2022-04-13 MED ORDER — CHLORHEXIDINE GLUCONATE CLOTH 2 % EX PADS: 6.0000 | MEDICATED_PAD | Freq: Every day | CUTANEOUS | Status: DC

## 2022-04-13 MED ORDER — METHOCARBAMOL 500 MG PO TABS
750.0000 mg | ORAL_TABLET | Freq: Four times a day (QID) | ORAL | Status: DC
  Administered 2022-04-13 – 2022-04-14 (×7): 750 mg via ORAL
  Filled 2022-04-13 (×8): qty 2

## 2022-04-13 MED ORDER — AMIODARONE HCL 200 MG PO TABS
100.0000 mg | ORAL_TABLET | Freq: Every day | ORAL | Status: DC
  Administered 2022-04-13 – 2022-04-14 (×2): 100 mg via ORAL
  Filled 2022-04-13 (×3): qty 1

## 2022-04-13 MED ORDER — POTASSIUM CHLORIDE CRYS ER 20 MEQ PO TBCR
20.0000 meq | EXTENDED_RELEASE_TABLET | Freq: Every day | ORAL | Status: DC
  Administered 2022-04-14: 20 meq via ORAL
  Filled 2022-04-13 (×2): qty 1

## 2022-04-13 MED ORDER — SODIUM CHLORIDE 0.9 % IV SOLN
INTRAVENOUS | Status: DC
Start: 2022-04-13 — End: 2022-04-15

## 2022-04-13 MED ORDER — ACETAMINOPHEN 500 MG PO TABS
1000.0000 mg | ORAL_TABLET | Freq: Four times a day (QID) | ORAL | Status: DC
  Administered 2022-04-13 – 2022-04-14 (×6): 1000 mg via ORAL
  Filled 2022-04-13 (×6): qty 2

## 2022-04-13 NOTE — Progress Notes (Signed)
Trauma/Critical Care Follow Up Note  Subjective:    Overnight Issues:   Objective:  Vital signs for last 24 hours: Temp:  [97.8 F (36.6 C)-98.6 F (37 C)] 97.8 F (36.6 C) (06/30 0800) Pulse Rate:  [94-99] 97 (06/30 0900) Resp:  [16-21] 19 (06/30 0900) BP: (108-144)/(58-89) 124/73 (06/30 0900) SpO2:  [93 %-100 %] 98 % (06/30 0900) Weight:  [55.6 kg-59.2 kg] 56.5 kg (06/29 2243)  Hemodynamic parameters for last 24 hours:    Intake/Output from previous day: No intake/output data recorded.  Intake/Output this shift: No intake/output data recorded.  Vent settings for last 24 hours:    Physical Exam:  Gen: comfortable, no distress Neuro: non-focal exam HEENT: PERRL Neck: supple CV: RRR Pulm: unlabored breathing Abd: soft, NT GU: clear yellow urine Extr: wwp, no edema   Results for orders placed or performed during the hospital encounter of 04/12/22 (from the past 24 hour(s))  MRSA Next Gen by PCR, Nasal     Status: None   Collection Time: 04/12/22 10:46 PM   Specimen: Nasal Mucosa; Nasal Swab  Result Value Ref Range   MRSA by PCR Next Gen NOT DETECTED NOT DETECTED  CBC     Status: Abnormal   Collection Time: 04/13/22  3:08 AM  Result Value Ref Range   WBC 12.1 (H) 4.0 - 10.5 K/uL   RBC 4.11 (L) 4.22 - 5.81 MIL/uL   Hemoglobin 11.7 (L) 13.0 - 17.0 g/dL   HCT 33.8 (L) 39.0 - 52.0 %   MCV 82.2 80.0 - 100.0 fL   MCH 28.5 26.0 - 34.0 pg   MCHC 34.6 30.0 - 36.0 g/dL   RDW 16.4 (H) 11.5 - 15.5 %   Platelets 120 (L) 150 - 400 K/uL   nRBC 0.0 0.0 - 0.2 %  Basic metabolic panel     Status: Abnormal   Collection Time: 04/13/22  3:08 AM  Result Value Ref Range   Sodium 133 (L) 135 - 145 mmol/L   Potassium 4.0 3.5 - 5.1 mmol/L   Chloride 102 98 - 111 mmol/L   CO2 18 (L) 22 - 32 mmol/L   Glucose, Bld 104 (H) 70 - 99 mg/dL   BUN 14 8 - 23 mg/dL   Creatinine, Ser 0.88 0.61 - 1.24 mg/dL   Calcium 7.8 (L) 8.9 - 10.3 mg/dL   GFR, Estimated >60 >60 mL/min   Anion  gap 13 5 - 15    Assessment & Plan: The plan of care was discussed with the bedside nurse for the day, Kieth Brightly, who is in agreement with this plan and no additional concerns were raised.   Present on Admission:  Splenic laceration    LOS: 1 day   Additional comments:I reviewed the patient's new clinical lab test results.   and I reviewed the patients new imaging test results.    31M s/p fall   Grade 3 splenic laceration - repeat labs stable, unclear if patient was taking blood thinner or not. No reversal agent given. Plan for 24h bedrest given question of AC. Check TEG. Recheck CBC in AM. Will need to discuss re-initiation and if so, utility of AE. Rib fractures - pain control, repeat XR shows no change Hemothorax - repeat XR shows no  change Recent PE on Xarelto - hold anticoagulation for now, no reversal given, unclear if he has been taking based on discussions with wife. Check TEG and b/l duplex u/s. Will need to discuss timing of re-initiation and possible IR AE.  Metastatic lung cancer - had undergone chemo/XRT, palliative has been engaged and recommended hospice given cancellation of treatment 2/2 declining functional status and severe weakness, but family was undecided. Consult palliative care today for continued goals of care discussions.    FEN-  CLD VTE- hold anticoagulation ID- none Dispo- 4NP  Lengthy clinical discussion held with patient's wife and one daughter at bedside, another daughter via phone. Detailed explanation of patient traumatic injuries and potential consequences of these injuries. Discussed his injuries in the broader context of his current clinical state. Reveiwed the chart in detail and noted previous palliative care discussions. Discussed decision-maker and confirmed no HC-POA in place and clearly stated to all parties that wife has exclusive legal decision-making rights. Engaged family in continued palliative care discussions today, including goals of care and  a discussion regarding code status. Wife seemed very confident in decision to transition to DNR status, but despite previously endorsing patient's inability to participate in decision-making, patient's daughter at bedside was firm about having the discussion with the patient before making any changes, so no changes were made at this time. Multiple inquiries regarding hospice from patient's daughter via phone. Queried about possibility of home hospice and support available with this option vs hospice facility. I advised that I would have someone from palliative and/or hospice speak with them to address these questions in further detail. We discussed his recent h/o PE and complications associated with re-initiation of DOAC. Will d/w Cards+Onc after duplex u/s results to determine risk/benefit of re-initiation of DOAC, however, I also encouraged the family to think about their goals of care on behalf of the patient and whether [place] re-initiation fit with those goals. We discussed whether aggressive medical interventions such as angioembolization and/or surgery would be in line with these goals. Will hopefully achieve some answers after discussions amongst themselves and with the palliative care team.   Critical care time: 16min  Arnoldo Hildreth N. Lasharon Dunivan, MD Trauma & General Surgery Please use [email or web address] to contact on call provider  04/13/2022  *Care during the described time interval was provided by me. I have reviewed this patient's available data, including medical history, events of note, physical examination and test results as part of my evaluation.

## 2022-04-13 NOTE — Progress Notes (Signed)
Additional end-of-life discussions held with the patient's wife and two daughters. Patient's wife elects to make patient DNR: no chest compressions, no defibrillation, no anti-arrythmics/vasopressors, and no intubation. We also discussed options if spleen laceration continues bleeding and patient's wife declines surgery and also declines angioembolization. I have reached out to Oncology to discuss if/when DOAC will be restarted. Unfortunately, this is a holiday weekend and patient's primary oncologist may not be available to join in the discussion, but the covering oncologist will weigh in. I do worry that the family will not be accepting of input from a covering oncologist.   Additional critical care time: 45min  Jesusita Oka, MD General and [place] [facility]

## 2022-04-13 NOTE — Progress Notes (Signed)
Lower extremity venous bilateral study completed.   Please see CV Proc for preliminary results.   Vesna Kable, RDMS, RVT  

## 2022-04-13 NOTE — Telephone Encounter (Signed)
Called daughter to make a follow up [facility] appt for Monday 7/3 per scheduling message. Daughter stated the pt fell and broke a rib and ruptured spleen. He was admitted into [facility] and transferred via mobile ICU to [facility] cone. Pt will not be scheduled for follow up on Monday.

## 2022-04-13 NOTE — Progress Notes (Signed)
Patient Scott Gross      DOB: 01/28/1934      IHW:[id number]      Palliative Medicine Team    Subjective: Bedside symptom check completed. The patient's wife and daughter Scott Gross were bedside at time if visit.    Physical exam: Patient resting in bed at time of visit. Breathing even and non-labored, no excessive secretions noted. Patient without physical or non-verbal signs of pain or discomfort at this time.    Assessment and plan: This RN brought MOST form to room and presented prior to provider consult in order to answer any questions for family and introduce palliative medicine team. Family was familiar with MOST form and denied questions at this time. Family made aware to this RN that they did not feel it necessary to have palliative medicine consult today. This RN and bedside RN provided education on the role of the inpatient palliative team. This RN called daughter Scott Gross at the request of Dr. Bobbye Morton and explained role and support offered by PMT. She was appreciative of call and said she is returning to the hospital this evening with her mother after quiet time. This RN encouraged family meeting with wife and three daughters at later date when all can be present. Scott Gross was understanding and would speak to rest of family to schedule time with PMT and Dr. Bobbye Morton, possibly Sunday. Patient and family have pre-existing relationship with [facility] outpatient services and express their desire to continue that with outpatient hospice at discharge. Will continue to follow for any changes or advances.    Thank you for allowing the Palliative Medicine Team to assist in the care of this patient.     Damian Leavell, MSN, RN Palliative Medicine Team Team Phone: [phone]  This phone is monitored 7a-7p, please reach out to attending physician outside of these hours for urgent needs.

## 2022-04-13 NOTE — Consult Note (Signed)
Referral MD  Reason for Referral: Progressive adenocarcinoma of the lung; traumatic splenic laceration; pulmonary embolism  No chief complaint on file. : I fell and hurt my spleen.  HPI: Mr. Hutcherson is a very nice 88 year old white male.  He is followed at the [facility] by Dr. Grayland Ormond.  He has metastatic adenocarcinoma of the lung.  He has been on Keytruda.  However, his performance status has declined quite quickly.  He has been having episodes of falling.  He was seen by Dr. Grayland Ormond yesterday and there was no further immunotherapy that was felt to be appropriate.  Mr. Aylward is had a pulmonary embolism about 3 weeks ago.  He had been on Eliquis because of cardiac issues.  As such, he was placed on Lovenox.  He was then switched over to Xarelto I think for ease of administration.  Yesterday, he was at home and fell.  He hit a side table.  He subsequently lacerated his spleen.  He had a 3.5 centimeter splenic laceration.  It was felt to be grade III.  He has been seen by trauma surgery.  We were asked to see him so we could discuss what our goals of care are and whether or not continued anticoagulation would be of benefit.  His family was with him.  They are all so very nice.  We really had good fellowship.  His faith is incredibly strong.  They tell me that he is not really able to walk anymore.  He is just too weak.  Anytime he gets out of bed, he requires assistance.  He just is not eating much anymore.  He is quite weak.  He is lost weight.  His weight is down to 125 pounds.  He has not had any pain.  I really had a very long talk with Mr. Hopkin's family.  We talked about different issues.  The main issue we talked about was whether or not we need him on anticoagulation.  Given his incredibly poor performance status (ECOG 4) I really think that the risk of anticoagulation far outweighs the benefit at this point.  I would hate to see him have a splenic bleed from this  laceration.  I would hate to see him fall and then bleed internally or bleed into his brain.  I just do not believe that anticoagulation at this point is really going to benefit him.  I realize he has this pulmonary embolism.  I realize that the pulmonary embolism is being driven by his underlying malignancy.  We all decided that no further anticoagulation would be to his benefit and as such, we will forego any anticoagulation.  We then talked about his desires to be kept alive on machines.  He is already a DO NOT RESUSCITATE.  We reiterated that.  He does not want to be kept alive on machine.  He just wants to go peacefully when his heart stops.  I totally agree with this.  We then talked about his desire as to whether or not he wanted surgery if this splenic laceration opened up.  I told him that this was a major procedure and that I just do not think that he would survive such a procedure.  Even if he survived, he would be on life support and he would not come off this and it would be up to his family to turn off the life support.  He does not wish to have any further surgery for the splenic laceration.  We then talked about artificial feeding.  He does not want to be fed artificially through a tube.  I totally agree with this.  I think studies have confirmed that artificial feeding in patients who have end-stage malignancy does not improve their outcome.  In fact, there probably is more damage that is probably done by artificial feeding with respect to the increased risk of infection.  Is apparent that his prognosis is less than 2 weeks in my opinion.  He basically is total care.  I really believe that he is very appropriate for Hospice Home.  I think this would be the best and only option for him upon discharge.  Again, his performance status is quite poor.  He really is not able to care for himself in any manner.  I just believe that he would benefit from being at [facility] [place].  I talked to his daughter about this.  She would really agree and would be totally for her father to go to [facility].  I just believe that he would be very difficult for the family to care for at home and I would prefer that his family be his family and not his "nursemaid" during this very difficult time.  We will see about an availability for a bed at [facility].  Again I think this would be the most dignified and respectful way for him to be able to pass on and for his family to be able to be with him and left him and respect him.     Past Medical History:  Diagnosis Date   Arthritis    CAD (coronary artery disease) Nov. 12, 2012   Inferior ST elevation MI in November of 2012 with ventricular fibrillation arrest. Status post drug-eluting stent placement to the mid LAD. Most recent cardiac catheterization in June of this year showed patent stent with minimal restenosis, 75% proximal LAD and 80% distal LAD which were unchanged from before. Mild disease in the left circumflex and moderate RCA disease. Ejection fraction was 55%.   Cancer of upper lobe of left lung ([place]) 11/2016   Rad tx's.   Cardiac arrest [facility]) Nov. 2012   x 2    Cardiac arrest - ventricular fibrillation    HOH (hard of hearing)    Left Hearing Aid   Hyperlipidemia    Hypertension    Post PTCA 2013   x2   ST elevation MI (STEMI) ([place]) 08/26/2011  :   Past Surgical History:  Procedure Laterality Date   CARDIAC CATHETERIZATION  2013   @ [place]; Box s/p stent    CORONARY ANGIOPLASTY     CORONARY/GRAFT ACUTE MI REVASCULARIZATION N/A 06/06/2021   Procedure: Coronary/Graft Acute MI Revascularization;  Surgeon: Wellington Hampshire, MD;  Location: [place] CV LAB;  Service: Cardiovascular;  Laterality: N/A;   ELECTROMAGNETIC NAVIGATION BROCHOSCOPY N/A 11/27/2016   Procedure: ELECTROMAGNETIC NAVIGATION BRONCHOSCOPY;  Surgeon: [name], MD;  Location: [facility] ORS;  Service: Cardiopulmonary;   Laterality: N/A;   LEFT HEART CATH AND CORONARY ANGIOGRAPHY N/A 06/06/2021   Procedure: LEFT HEART CATH AND CORONARY ANGIOGRAPHY;  Surgeon: Wellington Hampshire, MD;  Location: [place] CV LAB;  Service: Cardiovascular;  Laterality: N/A;  :   Current Facility-Administered Medications:    0.9 %  sodium chloride infusion, , Intravenous, Continuous, Lovick, Montel Culver, MD, Last Rate: 50 mL/hr at 04/13/22 1700, Infusion Verify at 04/13/22 1700   acetaminophen (TYLENOL) tablet 1,000 mg, 1,000 mg, Oral,  Q6H, Jesusita Oka, MD, 1,000 mg at 04/13/22 1847   amiodarone (PACERONE) tablet 100 mg, 100 mg, Oral, Daily, Jesusita Oka, MD, 100 mg at 04/13/22 1205   Chlorhexidine Gluconate Cloth 2 % PADS 6 each, 6 each, Topical, Daily, Lovick, Montel Culver, MD   docusate sodium (COLACE) capsule 100 mg, 100 mg, Oral, BID, Kinsinger, Arta Bruce, MD, 100 mg at 04/13/22 1157   hydrALAZINE (APRESOLINE) injection 10 mg, 10 mg, Intravenous, Q2H PRN, Kinsinger, Arta Bruce, MD   methocarbamol (ROBAXIN) tablet 750 mg, 750 mg, Oral, QID, Jesusita Oka, MD, 750 mg at 04/13/22 1847   metoprolol tartrate (LOPRESSOR) injection 5 mg, 5 mg, Intravenous, Q6H PRN, Kinsinger, Arta Bruce, MD   morphine (PF) 2 MG/ML injection 2-4 mg, 2-4 mg, Intravenous, Q1H PRN, Kinsinger, Arta Bruce, MD   ondansetron (ZOFRAN-ODT) disintegrating tablet 4 mg, 4 mg, Oral, Q6H PRN **OR** ondansetron (ZOFRAN) injection 4 mg, 4 mg, Intravenous, Q6H PRN, Kinsinger, Arta Bruce, MD   Oral care mouth rinse, 15 mL, Mouth Rinse, PRN, Kinsinger, Arta Bruce, MD   oxyCODONE (Oxy IR/ROXICODONE) immediate release tablet 2.5-5 mg, 2.5-5 mg, Oral, Q4H PRN, Lovick, Montel Culver, MD   potassium chloride SA (KLOR-CON M) CR tablet 20 mEq, 20 mEq, Oral, Daily, Lovick, Montel Culver, MD:   acetaminophen  1,000 mg Oral Q6H   amiodarone  100 mg Oral Daily   Chlorhexidine Gluconate Cloth  6 each Topical Daily   docusate sodium  100 mg Oral BID   methocarbamol  750 mg Oral QID    potassium chloride SA  20 mEq Oral Daily  :   Allergies  Allergen Reactions   Statins Other (See Comments)    Arthralagia  :   Family History  Problem Relation Age of Onset   Heart attack Mother    Heart attack Father   :   Social History   Socioeconomic History   Marital status: Married    Spouse name: Not on file   Number of children: Not on file   Years of education: Not on file   Highest education level: Not on file  Occupational History   Not on file  Tobacco Use   Smoking status: Former    Packs/day: 1.00    Types: Cigarettes    Quit date: 08/27/2011    Years since quitting: 10.6   Smokeless tobacco: Never  Vaping Use   Vaping Use: Never used  Substance and Sexual Activity   Alcohol use: No   Drug use: No   Sexual activity: Not Currently  Other Topics Concern   Not on file  Social History Narrative   Not on file   Social Determinants of Health   Financial Resource Strain: Low Risk  (05/23/2019)   Overall Financial Resource Strain (CARDIA)    Difficulty of Paying Living Expenses: Not hard at all  Food Insecurity: Unknown (05/23/2019)   Hunger Vital Sign    Worried About Running Out of Food in the Last Year: Patient refused    [place] in the Last Year: Patient refused  Transportation Needs: Unknown (05/23/2019)   [place] - Transportation    Lack of Transportation (Medical): Patient refused    Lack of Transportation (Non-Medical): Patient refused  Physical Activity: Unknown (05/23/2019)   Exercise Vital Sign    Days of Exercise per Week: Patient refused    Minutes of Exercise per Session: Patient refused  Stress: No Stress Concern Present (05/23/2019)   [employer] -  Occupational Stress Questionnaire    Feeling of Stress : Not at all  Social Connections: Unknown (05/23/2019)   Social Connection and Isolation Panel [NHANES]    Frequency of Communication with Friends and Family: Patient refused    Frequency of Social  Gatherings with Friends and Family: Patient refused    Attends Religious Services: Patient refused    Active Member of Clubs or Organizations: Patient refused    Attends Archivist Meetings: Patient refused    Marital Status: Patient refused  Intimate Partner Violence: Unknown (05/23/2019)   Humiliation, Afraid, Rape, and Kick questionnaire    Fear of Current or Ex-Partner: Patient refused    Emotionally Abused: Patient refused    Physically Abused: Patient refused    Sexually Abused: Patient refused  :  Review of Systems  Constitutional:  Positive for malaise/fatigue and weight loss.  HENT: Negative.    Eyes: Negative.   Respiratory:  Positive for shortness of breath.   Cardiovascular:  Positive for orthopnea.  Gastrointestinal: Negative.   Genitourinary: Negative.   Musculoskeletal:  Positive for joint pain.  Skin: Negative.   Neurological:  Positive for weakness.  Endo/Heme/Allergies: Negative.      Exam: Patient Vitals for the past 24 hrs:  BP Temp Temp src Pulse Resp SpO2 Height Weight  04/13/22 1700 120/67 -- -- 92 19 95 % -- --  04/13/22 1600 121/62 -- -- 86 20 97 % -- --  04/13/22 1500 113/63 -- -- 90 18 96 % -- --  04/13/22 1400 115/65 -- -- 91 20 96 % -- --  04/13/22 1300 123/62 -- -- 94 20 95 % -- --  04/13/22 1200 129/63 97.9 F (36.6 C) Oral 95 (!) 23 96 % -- --  04/13/22 1100 123/64 -- -- 95 (!) 21 97 % -- --  04/13/22 1000 121/70 -- -- 94 19 96 % -- --  04/13/22 0900 124/73 -- -- 97 19 98 % -- --  04/13/22 0805 (!) 144/69 -- -- 96 18 96 % -- --  04/13/22 0800 -- 97.8 F (36.6 C) Oral -- -- -- -- --  04/13/22 0600 126/65 -- -- 97 (!) 21 96 % -- --  04/13/22 0530 127/67 -- -- 97 16 95 % -- --  04/13/22 0500 126/61 -- -- 95 18 96 % -- --  04/13/22 0430 132/66 -- -- 97 18 95 % -- --  04/13/22 0400 127/66 98.6 F (37 C) Oral 94 19 96 % -- --  04/13/22 0330 126/63 -- -- 94 19 95 % -- --  04/13/22 0300 121/68 -- -- 97 17 93 % -- --  04/13/22 0230  (!) 123/59 -- -- 99 19 94 % -- --  04/13/22 0200 122/64 -- -- 99 19 93 % -- --  04/13/22 0130 124/63 -- -- 96 20 94 % -- --  04/13/22 0100 126/66 -- -- 97 19 94 % -- --  04/13/22 0030 116/69 -- -- 97 19 93 % -- --  04/13/22 0000 117/60 98.6 F (37 C) Oral 97 19 94 % -- --  04/12/22 2345 121/61 -- -- 97 (!) 21 94 % -- --  04/12/22 2330 125/67 -- -- 98 (!) 21 94 % -- --  04/12/22 2315 121/62 -- -- 99 (!) 21 94 % -- --  04/12/22 2300 129/69 -- -- 97 18 94 % -- --  04/12/22 2245 140/73 -- -- 95 19 97 % -- --  04/12/22 2243 (!) 142/76 98.2 F (36.8 C)  Oral 96 20 94 % 5\' 11"  (1.803 m) 124 lb 9 oz (56.5 kg)   Physical Exam Vitals reviewed.  Constitutional:      Comments: Mr. Slagter is a cachectic appearing male.  He has some temporal muscle wasting.  He is somewhat lethargic but does answer questions and is appropriate.  HENT:     Head: Normocephalic and atraumatic.  Eyes:     Pupils: Pupils are equal, round, and reactive to light.  Cardiovascular:     Rate and Rhythm: Normal rate and regular rhythm.     Heart sounds: Normal heart sounds.  Pulmonary:     Effort: Pulmonary effort is normal.     Breath sounds: Normal breath sounds.  Abdominal:     General: Bowel sounds are normal.     Palpations: Abdomen is soft.  Musculoskeletal:        General: No tenderness or deformity. Normal range of motion.     Cervical back: Normal range of motion.     Comments: He has symmetric muscle atrophy in upper and lower extremities.  He has symmetric weakness in his upper and lower extremities bilaterally.  Lymphadenopathy:     Cervical: No cervical adenopathy.  Skin:    General: Skin is warm and dry.     Findings: No erythema or rash.  Neurological:     Mental Status: He is alert and oriented to person, place, and time.  Psychiatric:        Behavior: Behavior normal.        Thought Content: Thought content normal.        Judgment: Judgment normal.     Recent Labs    04/12/22 1912  04/13/22 0308  WBC 12.2* 12.1*  HGB 11.5* 11.7*  HCT 34.9* 33.8*  PLT 120* 120*    Recent Labs    04/12/22 1912 04/13/22 0308  NA 131* 133*  K 4.2 4.0  CL 100 102  CO2 23 18*  GLUCOSE 127* 104*  BUN 16 14  CREATININE 0.87 0.88  CALCIUM 8.0* 7.8*    Blood smear review: None  Pathology: None    Assessment and Plan: Ms. Wyndham is a very nice 88 year old white male.  He has progressive and what I feel is end-stage lung adenocarcinoma.  He is in the hospital because of a splenic laceration from a fall.  He is incredibly weak.  He is declining.  He really has declined over the past couple weeks or so.  Again, we had a very long discussion regarding his situation and end-of-life issues.  This is detailed above.  He really, really, really needs to go to [facility].  His prognosis is easily less than 2 weeks.  I just want him to have comfort, respect and dignity.  He deserves this.  His family needs to be with him and be his family and not his caretakers at this time in his life.  We had a very good prayer at the end of our meeting.  Again he has a strong faith.  He has a wonderful family who will support him.  They just want him to be comfortable and they want him to have that respect.  We will try to arrange for him to go to [facility].  Hopefully, there will be a bed for him.  Lattie Haw, MD  2 Timothy 4:16-18

## 2022-04-14 DIAGNOSIS — C3412 Malignant neoplasm of upper lobe, left bronchus or lung: Secondary | ICD-10-CM | POA: Diagnosis not present

## 2022-04-14 DIAGNOSIS — Z515 Encounter for palliative care: Secondary | ICD-10-CM

## 2022-04-14 DIAGNOSIS — S36039A Unspecified laceration of spleen, initial encounter: Secondary | ICD-10-CM | POA: Diagnosis not present

## 2022-04-14 DIAGNOSIS — I2699 Other pulmonary embolism without acute cor pulmonale: Secondary | ICD-10-CM | POA: Diagnosis not present

## 2022-04-14 DIAGNOSIS — R531 Weakness: Secondary | ICD-10-CM | POA: Diagnosis not present

## 2022-04-14 LAB — CBC
HCT: 33.4 % — ABNORMAL LOW (ref 39.0–52.0)
Hemoglobin: 11.5 g/dL — ABNORMAL LOW (ref 13.0–17.0)
MCH: 28.1 pg (ref 26.0–34.0)
MCHC: 34.4 g/dL (ref 30.0–36.0)
MCV: 81.7 fL (ref 80.0–100.0)
Platelets: 102 10*3/uL — ABNORMAL LOW (ref 150–400)
RBC: 4.09 MIL/uL — ABNORMAL LOW (ref 4.22–5.81)
RDW: 16.2 % — ABNORMAL HIGH (ref 11.5–15.5)
WBC: 13.2 10*3/uL — ABNORMAL HIGH (ref 4.0–10.5)
nRBC: 0 % (ref 0.0–0.2)

## 2022-04-14 MED ORDER — ACETAMINOPHEN 500 MG PO TABS: 1000.0000 mg | ORAL_TABLET | Freq: Four times a day (QID) | ORAL | 0 refills | Status: AC

## 2022-04-14 MED ORDER — METHOCARBAMOL 750 MG PO TABS: 750.0000 mg | ORAL_TABLET | Freq: Four times a day (QID) | ORAL | 0 refills | Status: AC

## 2022-04-14 MED ORDER — ALBUTEROL SULFATE (2.5 MG/3ML) 0.083% IN NEBU
2.5000 mg | INHALATION_SOLUTION | Freq: Four times a day (QID) | RESPIRATORY_TRACT | Status: DC | PRN
  Administered 2022-04-14: 2.5 mg via RESPIRATORY_TRACT
  Filled 2022-04-14: qty 3

## 2022-04-14 NOTE — Progress Notes (Addendum)
Manufacturing engineer [facility]) Hospital Liaison Note   Met with family at bedside to explain hospice services (home vs Hospice Home) per PMT provider/Michelle F., NP., request. MSW met patient, wife/Brenda and daughter/Christine at bedside and provided extensive education for hospice services.  Family requested additional time to discuss options with patient and to reach out to  patient's other children before making a final decision. MSW voiced understanding. All questions answered and no concerns voiced.  At this time, patient is not under review for [facility] services as family continues to have ongoing [place] discussions.    [facility] information and contact numbers given to family & above information shared with TOC.  ADDENDUM: Consent forms to be completed.  EMS to be notified of patient D/C and transport arranged. TOC and attending provider aware of plan to discharge to the Hospice Home.   Please send signed DNR form with patient and RN call report to [phone].    Please call with any questions/concerns.    Thank you for the opportunity to participate in this patient's care.   Daphene Calamity, MSW [facility] Liaison

## 2022-04-14 NOTE — Progress Notes (Signed)
Nutrition Brief Note  Chart reviewed. Pt now transitioning to comfort care.  No further nutrition interventions planned at this time.  Please re-consult as needed.   Christofer Shen W, RD, LDN, CDCES Registered Dietitian II Certified Diabetes Care and Education Specialist Please refer to AMION for RD and/or RD on-call/weekend/after hours pager   

## 2022-04-14 NOTE — Progress Notes (Signed)
I had another long talk with Mr. Charlot.  His 1 daughter was with this as was his wife, his brother and sister.  2 of his daughters were on the cell phone.  We talked about what might be best for him for when he leaves the hospital.  I still believe that it would be best for him to go to [facility] in [place].  I think this would be easiest for he and his family.  Again, I really do not think that we are looking at a lot of time with him.  I still believe that we are looking at less than 2 weeks in all honesty.  He is just not eating much.  He is not able to really get out of bed because of weakness.  He is off anticoagulation.  His hemoglobin is holding steady at 11.5.  I really believe that it would be to his comfort, respect and dignity if he were to go to [facility] in [place].  His family will be with him all the time.  He would be able to get very compassionate care.  His family would not have to worry about taking care of issues that they are not trained for.  I just had a philosophy that in the end of life, if the patient's family needs to be his family and spend quality time with him and not have to worry about dealing with medical issues that they are not capable of handling.  I believe that [place] is the best option for him that we will give him the best quality of life for the time that he has left on this planet.  As always, we had a very good prayer.  I am just grateful that we can all pray  together and have good fellowship.  It is apparent that he is getting incredible care from the staff up on [facility].  Lattie Haw, MD  Romans 1:16

## 2022-04-14 NOTE — Progress Notes (Addendum)
PTAR present for transport of patient to [facility] in [place]. Question regarding the use of PTAR vs [facility] EMS. Paged Hassan Rowan from [facility], who verified to use PTAR tonight for patient transport and further clarification will be reviewed with [facility] next week.  Patient dressed in home clothing and all belongings accounted for. Patient denied the need for pain medications for transport after several inquiries.   Patient discharged via [place] with wife and 2 grandchildren present. 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 Visconti.

## 2022-04-14 NOTE — Progress Notes (Signed)
Report called to Jenny Reichmann, RN at [facility]

## 2022-04-14 NOTE — TOC Transition Note (Signed)
Transition of Care [facility]) - CM/SW Discharge Note   Patient Details  Name: Scott Gross MRN: [id number] Date of Birth: Jan 02, 1934  Transition of Care [facility]) CM/SW Contact:  Alfredia Ferguson, LCSW Phone Number: 04/14/2022, 5:02 PM   Clinical Narrative:    Patient is discharging to [place]. Consents completed by family, primary team aware. RN report called. CSW arranged PTAR for transport with medical necessity form and DNR on chart.          Patient Goals and CMS Choice        Discharge Placement                       Discharge Plan and Services                                     Social Determinants of Health (SDOH) Interventions     Readmission Risk Interventions     No data to display

## 2022-04-14 NOTE — Evaluation (Signed)
Physical Therapy Evaluation Patient Details Name: Scott Gross MRN: [id number] DOB: 04/07/1934 Today's Date: 04/14/2022  History of Present Illness  Pt is an 88 y.o. male admitted 6/30 following a fall at home in which he sustained L 11th & 12th rib fxs, small pneumothorax, and grade 3 spleen laceration. Plan is to treat nonoperatively. PMH: CAD, a fib, DM, recent PE, metastatic lung cancer with recent cessation of Rx. Family determining GOC with likely transition to hospice (home vs facility).   Clinical Impression  Pt admitted with above diagnosis. PTA pt lived at home with his family, progressively requiring increased assist for mobility/ADLs and having falls. Pt currently with functional limitations due to the deficits listed below (see PT Problem List). On eval, pt required mod assist bed mobility, mod assist sit to stand, and mod assist SPT. He demonstrated poor sitting and standing balance as well as deficits in strength and activity tolerance. Pt will benefit from skilled PT to increase their independence and safety with mobility to allow discharge to the venue listed below.  Family deciding between home hospice vs hospice facility. If pt returns home, recommend 2-person assist for all mobility (primarily limited to transfers) and below noted DME.        Recommendations for follow up therapy are one component of a multi-disciplinary discharge planning process, led by the attending physician.  Recommendations may be updated based on patient status, additional functional criteria and insurance authorization.  Follow Up Recommendations Other (comment) (hospice)      Assistance Recommended at Discharge Frequent or constant Supervision/Assistance  Patient can return home with the following  Two people to help with walking and/or transfers;Two people to help with bathing/dressing/bathroom;Assistance with cooking/housework;Direct supervision/assist for medications management;Assist for  transportation;Help with stairs or ramp for entrance;Assistance with feeding    Equipment Recommendations Hospital bed;BSC/3in1;Wheelchair (measurements PT)  Recommendations for Other Services       Functional Status Assessment Patient has had a recent decline in their functional status and/or demonstrates limited ability to make significant improvements in function in a reasonable and predictable amount of time     Precautions / Restrictions Precautions Precautions: Fall      Mobility  Bed Mobility Overal bed mobility: Needs Assistance Bed Mobility: Supine to Sit     Supine to sit: Mod assist, HOB elevated     General bed mobility comments: increased time, cues for sequencing    Transfers Overall transfer level: Needs assistance Equipment used: 1 person hand held assist Transfers: Sit to/from Stand, Bed to chair/wheelchair/BSC Sit to Stand: Mod assist Stand pivot transfers: Mod assist         General transfer comment: therapist assisting anterior to pt. Assist to power up, stabilize balance, and pivot to L.    Ambulation/Gait                  Stairs            Wheelchair Mobility    Modified Rankin (Stroke Patients Only)       Balance Overall balance assessment: Needs assistance Sitting-balance support: Bilateral upper extremity supported, Feet supported Sitting balance-Leahy Scale: Poor   Postural control: Left lateral lean Standing balance support: During functional activity, Bilateral upper extremity supported Standing balance-Leahy Scale: Poor Standing balance comment: reliant on external assist                             Pertinent Vitals/Pain Pain Assessment Pain Assessment: No/denies  pain    Home Living Family/patient expects to be discharged to:: Private residence Living Arrangements: Spouse/significant other Available Help at Discharge: Family Type of Home: House Home Access: Stairs to enter                 Prior Function               Mobility Comments: multiple recent falls at home. Has progressively been requiring increased assist from family for mobility and ADLs.       Hand Dominance        Extremity/Trunk Assessment   Upper Extremity Assessment Upper Extremity Assessment: Generalized weakness    Lower Extremity Assessment Lower Extremity Assessment: Generalized weakness    Cervical / Trunk Assessment Cervical / Trunk Assessment: Kyphotic  Communication   Communication: No difficulties  Cognition Arousal/Alertness: Awake/alert Behavior During Therapy: Flat affect Overall Cognitive Status: Impaired/Different from baseline Area of Impairment: Safety/judgement, Problem solving, Awareness, Memory                     Memory: Decreased short-term memory   Safety/Judgement: Decreased awareness of safety, Decreased awareness of deficits Awareness: Intellectual Problem Solving: Slow processing, Difficulty sequencing, Requires verbal cues          General Comments General comments (skin integrity, edema, etc.): VSS on RA    Exercises     Assessment/Plan    PT Assessment Patient needs continued PT services  PT Problem List Decreased strength;Decreased mobility;Decreased safety awareness;Decreased balance;Decreased activity tolerance;Decreased knowledge of precautions;Decreased knowledge of use of DME       PT Treatment Interventions Therapeutic activities;Therapeutic exercise;Patient/family education;Balance training;Functional mobility training    PT Goals (Current goals can be found in the Care Plan section)  Acute Rehab PT Goals Patient Stated Goal: not stated PT Goal Formulation: With patient/family Time For Goal Achievement: 04/28/22 Potential to Achieve Goals: Fair    Frequency Min 2X/week     Co-evaluation               AM-PAC PT "6 Clicks" Mobility  Outcome Measure Help needed turning from your back to your side while in a flat  bed without using bedrails?: A Lot Help needed moving from lying on your back to sitting on the side of a flat bed without using bedrails?: A Lot Help needed moving to and from a bed to a chair (including a wheelchair)?: A Lot Help needed standing up from a chair using your arms (e.g., wheelchair or bedside chair)?: A Lot Help needed to walk in hospital room?: Total Help needed climbing 3-5 steps with a railing? : Total 6 Click Score: 10    End of Session Equipment Utilized During Treatment: Gait belt Activity Tolerance: Patient tolerated treatment well Patient left: in chair;with call bell/phone within reach;with chair alarm set;with family/visitor present Nurse Communication: Mobility status PT Visit Diagnosis: Muscle weakness (generalized) (M62.81)    Time: 1975-8832 PT Time Calculation (min) (ACUTE ONLY): 30 min   Charges:   PT Evaluation $PT Eval Moderate Complexity: 1 Mod PT Treatments $Therapeutic Activity: 8-22 mins        Lorrin Goodell, PT  Office # [phone] Pager [phone]   Scott Gross 04/14/2022, 1:49 PM

## 2022-04-14 NOTE — Progress Notes (Signed)
   Palliative Medicine Inpatient Follow Up Note    Per note review extensive conversations have been held between the Trauma surgery and Oncology teams.   [facility] liaison, Lorayne Bender is engaged with family.   Per Dr. Marin Olp a referral has been made to hospice home in [place].   This Palliative provider has called and left a VM for patients daughter, Jacqlyn Larsen.   Plan for transfer this afternoon to inpatient hospice.   No Charge ______________________________________________________________________________________ [place] Team Team Cell Phone: [phone] Please utilize secure chat with additional questions, if there is no response within 30 minutes please call the above phone number  Palliative Medicine Team providers are available by phone from 7am to 7pm daily and can be reached through the team cell phone.  Should this patient require assistance outside of these hours, please call the patient's attending physician.

## 2022-04-14 NOTE — Progress Notes (Signed)
Trauma/Critical Care Follow Up Note  Subjective:    Overnight Issues:   Objective:  Vital signs for last 24 hours: Temp:  [97.1 F (36.2 C)-98.2 F (36.8 C)] 98 F (36.7 C) (07/01 0800) Pulse Rate:  [86-95] 94 (07/01 0800) Resp:  [14-23] 16 (07/01 0800) BP: (110-137)/(58-70) 134/69 (07/01 0800) SpO2:  [93 %-99 %] 99 % (07/01 0800)  Hemodynamic parameters for last 24 hours:    Intake/Output from previous day: 06/30 0701 - 07/01 0700 In: 887.6 [I.V.:887.6] Out: -   Intake/Output this shift: Total I/O In: 100.1 [I.V.:100.1] Out: -   Vent settings for last 24 hours:    Physical Exam:  Gen: comfortable, no distress Neuro: non-focal exam HEENT: PERRL Neck: supple CV: RRR Pulm: unlabored breathing Abd: soft, NT GU: clear yellow urine Extr: wwp, no edema   Results for orders placed or performed during the hospital encounter of 04/12/22 (from the past 24 hour(s))  Trauma TEG Panel     Status: Abnormal   Collection Time: 04/13/22 12:16 PM  Result Value Ref Range   Citrated Kaolin (R) 4.6 4.6 - 9.1 min   Citrated Rapid TEG (MA) 51.9 (L) 52 - 70 mm   CFF Max Amplitude 15.5 15 - 32 mm   Lysis at 30 Minutes 0 0.0 - 2.6 %  CBC     Status: Abnormal   Collection Time: 04/14/22  3:18 AM  Result Value Ref Range   WBC 13.2 (H) 4.0 - 10.5 K/uL   RBC 4.09 (L) 4.22 - 5.81 MIL/uL   Hemoglobin 11.5 (L) 13.0 - 17.0 g/dL   HCT 33.4 (L) 39.0 - 52.0 %   MCV 81.7 80.0 - 100.0 fL   MCH 28.1 26.0 - 34.0 pg   MCHC 34.4 30.0 - 36.0 g/dL   RDW 16.2 (H) 11.5 - 15.5 %   Platelets 102 (L) 150 - 400 K/uL   nRBC 0.0 0.0 - 0.2 %    Assessment & Plan: The plan of care was discussed with the bedside nurse for the day, Jen C, who is in agreement with this plan and no additional concerns were raised.   Present on Admission:  Splenic laceration    LOS: 2 days   Additional comments:I reviewed the patient's new clinical lab test results.   and I reviewed the patients new imaging test  results.    57M s/p fall   Grade 3 splenic laceration - repeat labs stable, unclear if patient was taking blood thinner or not. No reversal agent given. S/p 24h bedrest. CBC stable. I would not recommend re-initiation of DOAC, family declines AE and surgery. Rib fractures - pain control, repeat XR shows no change Hemothorax - repeat XR shows no change Recent PE on Xarelto - hold anticoagulation for now, no reversal given. B/l duplex u/s negative. Recommend no re-initiation. Metastatic lung cancer - had undergone chemo/XRT, palliative has been engaged and recommended hospice given cancellation of treatment 2/2 declining functional status and severe weakness, but family was undecided. Palliative care and hospice engaged, family planning for hospice, but deciding on home hospice vs hospice facility.   FEN-  regular diet, okay to have outside food VTE- hold anticoagulation ID- none Dispo- 4NP, continue hospice planning. I spoke with [facility] today who says he could be transferred to a facility in [place] this weekend, but that home hospice likely would not be able to be arranged until Monday.  Critical Care Total Time: 85 minutes  Jesusita Oka, MD Trauma &  General Surgery Please use [email or web address] to contact on call provider  04/14/2022  *Care during the described time interval was provided by me. I have reviewed this patient's available data, including medical history, events of note, physical examination and test results as part of my evaluation.

## 2022-04-15 NOTE — Discharge Summary (Signed)
Patient ID: Scott Gross [id number] 01-30-1934 88 y.o.  Admit date: 04/12/2022 Discharge date: 04/15/2022  Admitting Diagnosis: Fall  Discharge Diagnosis Patient Active Problem List   Diagnosis Date Noted   Splenic laceration 04/12/2022   Atrial thrombus 03/25/2022   Pulmonary embolism ([place]) 03/24/2022   Acute pulmonary embolus (H[facility]) 03/23/2022   Pleural effusion    Chronic systolic CHF (congestive heart failure) ([place]) 08/01/2021   Acute respiratory failure ([place]) 08/01/2021   Hypokalemia 08/01/2021   Pulmonary nodules 08/01/2021   A-fib ([place]) 06/09/2021   Atrial fibrillation with rapid ventricular response ([place]) 06/08/2021   Acute ST elevation myocardial infarction (STEMI) of lateral wall ([place]) 06/06/2021   Chronic anticoagulation 06/06/2021   Protein-calorie malnutrition, severe 06/06/2021   Malnutrition of moderate degree 06/22/2019   CAP (community acquired pneumonia) 06/21/2019   SIRS (systemic inflammatory response syndrome) ([employer]) 05/23/2019   Atrial fibrillation with RVR (HCC)    AF (paroxysmal atrial fibrillation) ([place]) 05/20/2019   Cardiac arrest ([place])    Diabetes mellitus type 2, uncomplicated ([place]) 27/78/2423   Angina pectoris ([place]) 12/17/2016   Adenopathy    Cancer of upper lobe of left lung ([place]) 09/10/2016   Hyperlipidemia    Hypertension    CAD (coronary artery disease) 08/27/2011   Myocardial infarction ([place]) 08/16/2011    Consultants Oncology  Reason for Admission: Splenic laceration  Procedures None  Hospital Course:  Uncomplicated    Physical Exam: Gen: comfortable, no distress Neuro: non-focal exam HEENT: PERRL Neck: supple CV: RRR Pulm: unlabored breathing Abd: soft, NT GU: clear yellow urine Extr: wwp, no edema   Allergies as of 04/14/2022       Reactions   Statins Other (See Comments)   Arthralagia        Medication List     STOP taking these medications    enoxaparin 100 MG/ML injection Commonly known as:  LOVENOX   rivaroxaban 20 MG Tabs tablet Commonly known as: XARELTO       TAKE these medications    acetaminophen 500 MG tablet Commonly known as: TYLENOL Take 2 tablets (1,000 mg total) by mouth every 6 (six) hours.   albuterol 108 (90 Base) MCG/ACT inhaler Commonly known as: VENTOLIN HFA Inhale into the lungs every 6 (six) hours as needed.   albuterol (2.5 MG/3ML) 0.083% nebulizer solution Commonly known as: PROVENTIL Take 2.5 mg by nebulization every 6 (six) hours as needed for wheezing or shortness of breath.   amiodarone 200 MG tablet Commonly known as: Pacerone Take 1 tablet (200 mg total) by mouth 2 (two) times daily. What changed:  how much to take when to take this   amLODipine 5 MG tablet Commonly known as: NORVASC Take by mouth.   clotrimazole-betamethasone cream Commonly known as: Lotrisone Apply to affected area 2 times daily   guaiFENesin 600 MG 12 hr tablet Commonly known as: MUCINEX Take 600 mg by mouth 2 (two) times daily as needed for cough or to loosen phlegm.   losartan-hydrochlorothiazide 50-12.5 MG tablet Commonly known as: HYZAAR Take 1 tablet by mouth daily.   methocarbamol 750 MG tablet Commonly known as: ROBAXIN Take 1 tablet (750 mg total) by mouth 4 (four) times daily.   mirtazapine 7.5 MG tablet Commonly known as: REMERON Take by mouth.   naproxen sodium 220 MG tablet Commonly known as: ALEVE Take 220 mg by mouth 2 (two) times daily with a meal.   potassium chloride SA 20 MEQ tablet Commonly known as: KLOR-CON M Take 1 tablet (  20 mEq total) by mouth daily.   rosuvastatin 5 MG tablet Commonly known as: CRESTOR Take 2.5 mg by mouth daily.   torsemide 20 MG tablet Commonly known as: DEMADEX Take 1 tablet (20 mg total) by mouth daily.   traMADol 50 MG tablet Commonly known as: ULTRAM Take 1 tablet (50 mg total) by mouth 2 (two) times daily. As needed for pain.            Signed: Jesusita Oka, [facility] 04/15/2022, 6:22 PM

## 2022-04-16 ENCOUNTER — Encounter: Payer: Medicare HMO | Admitting: Hospice and Palliative Medicine

## 2022-04-18 ENCOUNTER — Inpatient Hospital Stay: Payer: Medicare HMO | Attending: Nurse Practitioner | Admitting: Occupational Therapy

## 2022-05-07 ENCOUNTER — Other Ambulatory Visit: Payer: Medicare HMO | Admitting: Nurse Practitioner

## 2022-05-15 DEATH — deceased
